# Patient Record
Sex: Female | Born: 1935 | Race: White | Hispanic: No | Smoking: Never smoker
Health system: Southern US, Community
[De-identification: ages and names within clinical notes are randomized; demographics above are authoritative.]

## PROBLEM LIST (undated history)

## (undated) DIAGNOSIS — R0602 Shortness of breath: Secondary | ICD-10-CM

## (undated) DIAGNOSIS — N183 Chronic kidney disease, stage 3 unspecified: Secondary | ICD-10-CM

## (undated) DIAGNOSIS — Z923 Personal history of irradiation: Secondary | ICD-10-CM

## (undated) DIAGNOSIS — H269 Unspecified cataract: Secondary | ICD-10-CM

## (undated) DIAGNOSIS — C50919 Malignant neoplasm of unspecified site of unspecified female breast: Secondary | ICD-10-CM

## (undated) DIAGNOSIS — M1990 Unspecified osteoarthritis, unspecified site: Secondary | ICD-10-CM

## (undated) DIAGNOSIS — T7840XA Allergy, unspecified, initial encounter: Secondary | ICD-10-CM

## (undated) DIAGNOSIS — E039 Hypothyroidism, unspecified: Secondary | ICD-10-CM

## (undated) DIAGNOSIS — Z5189 Encounter for other specified aftercare: Secondary | ICD-10-CM

## (undated) DIAGNOSIS — I491 Atrial premature depolarization: Secondary | ICD-10-CM

## (undated) DIAGNOSIS — Z9289 Personal history of other medical treatment: Secondary | ICD-10-CM

## (undated) DIAGNOSIS — F0390 Unspecified dementia without behavioral disturbance: Secondary | ICD-10-CM

## (undated) DIAGNOSIS — I10 Essential (primary) hypertension: Secondary | ICD-10-CM

## (undated) DIAGNOSIS — E785 Hyperlipidemia, unspecified: Secondary | ICD-10-CM

## (undated) DIAGNOSIS — R410 Disorientation, unspecified: Secondary | ICD-10-CM

## (undated) DIAGNOSIS — E079 Disorder of thyroid, unspecified: Secondary | ICD-10-CM

## (undated) DIAGNOSIS — I421 Obstructive hypertrophic cardiomyopathy: Secondary | ICD-10-CM

## (undated) DIAGNOSIS — C801 Malignant (primary) neoplasm, unspecified: Secondary | ICD-10-CM

## (undated) DIAGNOSIS — F419 Anxiety disorder, unspecified: Secondary | ICD-10-CM

## (undated) HISTORY — DX: Obstructive hypertrophic cardiomyopathy: I42.1

## (undated) HISTORY — DX: Malignant (primary) neoplasm, unspecified: C80.1

## (undated) HISTORY — PX: OTHER SURGICAL HISTORY: SHX169

## (undated) HISTORY — DX: Encounter for other specified aftercare: Z51.89

## (undated) HISTORY — DX: Allergy, unspecified, initial encounter: T78.40XA

## (undated) HISTORY — DX: Disorder of thyroid, unspecified: E07.9

## (undated) HISTORY — DX: Personal history of other medical treatment: Z92.89

## (undated) HISTORY — PX: ROTATOR CUFF REPAIR: SHX139

## (undated) HISTORY — DX: Essential (primary) hypertension: I10

## (undated) HISTORY — DX: Unspecified cataract: H26.9

## (undated) HISTORY — DX: Hyperlipidemia, unspecified: E78.5

## (undated) HISTORY — PX: JOINT REPLACEMENT: SHX530

## (undated) HISTORY — DX: Unspecified osteoarthritis, unspecified site: M19.90

## (undated) HISTORY — PX: KNEE SURGERY: SHX244

---

## 1998-06-08 ENCOUNTER — Ambulatory Visit (HOSPITAL_COMMUNITY): Admission: RE | Admit: 1998-06-08 | Discharge: 1998-06-08 | Payer: Self-pay | Admitting: Gastroenterology

## 1998-06-20 ENCOUNTER — Other Ambulatory Visit: Admission: RE | Admit: 1998-06-20 | Discharge: 1998-06-20 | Payer: Self-pay | Admitting: Cardiology

## 1998-11-21 ENCOUNTER — Other Ambulatory Visit: Admission: RE | Admit: 1998-11-21 | Discharge: 1998-11-21 | Payer: Self-pay | Admitting: Obstetrics and Gynecology

## 1998-11-24 HISTORY — PX: BREAST LUMPECTOMY: SHX2

## 1999-08-28 ENCOUNTER — Ambulatory Visit (HOSPITAL_COMMUNITY): Admission: RE | Admit: 1999-08-28 | Discharge: 1999-08-28 | Payer: Self-pay | Admitting: General Surgery

## 1999-08-28 ENCOUNTER — Encounter: Payer: Self-pay | Admitting: General Surgery

## 1999-09-03 ENCOUNTER — Other Ambulatory Visit: Admission: RE | Admit: 1999-09-03 | Discharge: 1999-09-03 | Payer: Self-pay | Admitting: Obstetrics and Gynecology

## 1999-09-06 ENCOUNTER — Encounter: Payer: Self-pay | Admitting: General Surgery

## 1999-09-10 ENCOUNTER — Encounter: Payer: Self-pay | Admitting: General Surgery

## 1999-09-10 ENCOUNTER — Ambulatory Visit (HOSPITAL_COMMUNITY): Admission: RE | Admit: 1999-09-10 | Discharge: 1999-09-10 | Payer: Self-pay | Admitting: General Surgery

## 1999-09-18 ENCOUNTER — Encounter: Admission: RE | Admit: 1999-09-18 | Discharge: 1999-12-17 | Payer: Self-pay | Admitting: Radiation Oncology

## 2000-01-11 ENCOUNTER — Other Ambulatory Visit: Admission: RE | Admit: 2000-01-11 | Discharge: 2000-01-11 | Payer: Self-pay | Admitting: Endocrinology

## 2000-04-30 ENCOUNTER — Other Ambulatory Visit: Admission: RE | Admit: 2000-04-30 | Discharge: 2000-04-30 | Payer: Self-pay | Admitting: Obstetrics and Gynecology

## 2000-07-21 ENCOUNTER — Ambulatory Visit (HOSPITAL_COMMUNITY): Admission: RE | Admit: 2000-07-21 | Discharge: 2000-07-21 | Payer: Self-pay | Admitting: Obstetrics and Gynecology

## 2000-07-21 ENCOUNTER — Encounter (INDEPENDENT_AMBULATORY_CARE_PROVIDER_SITE_OTHER): Payer: Self-pay | Admitting: Specialist

## 2000-08-03 ENCOUNTER — Other Ambulatory Visit: Admission: RE | Admit: 2000-08-03 | Discharge: 2000-08-03 | Payer: Self-pay | Admitting: Endocrinology

## 2000-11-24 HISTORY — PX: THYROID SURGERY: SHX805

## 2001-06-15 ENCOUNTER — Encounter: Payer: Self-pay | Admitting: Otolaryngology

## 2001-06-17 ENCOUNTER — Encounter (INDEPENDENT_AMBULATORY_CARE_PROVIDER_SITE_OTHER): Payer: Self-pay | Admitting: *Deleted

## 2001-06-17 ENCOUNTER — Inpatient Hospital Stay (HOSPITAL_COMMUNITY): Admission: RE | Admit: 2001-06-17 | Discharge: 2001-06-18 | Payer: Self-pay | Admitting: Otolaryngology

## 2001-08-04 ENCOUNTER — Other Ambulatory Visit: Admission: RE | Admit: 2001-08-04 | Discharge: 2001-08-04 | Payer: Self-pay | Admitting: Obstetrics and Gynecology

## 2002-12-29 ENCOUNTER — Other Ambulatory Visit: Admission: RE | Admit: 2002-12-29 | Discharge: 2002-12-29 | Payer: Self-pay | Admitting: Obstetrics and Gynecology

## 2004-02-09 ENCOUNTER — Ambulatory Visit (HOSPITAL_COMMUNITY): Admission: RE | Admit: 2004-02-09 | Discharge: 2004-02-09 | Payer: Self-pay | Admitting: Gastroenterology

## 2005-01-17 ENCOUNTER — Ambulatory Visit: Payer: Self-pay | Admitting: Hematology & Oncology

## 2005-05-13 ENCOUNTER — Emergency Department (HOSPITAL_COMMUNITY): Admission: EM | Admit: 2005-05-13 | Discharge: 2005-05-13 | Payer: Self-pay | Admitting: Emergency Medicine

## 2005-07-18 ENCOUNTER — Ambulatory Visit: Payer: Self-pay | Admitting: Hematology & Oncology

## 2005-11-24 HISTORY — PX: BREAST LUMPECTOMY: SHX2

## 2006-01-21 ENCOUNTER — Ambulatory Visit: Payer: Self-pay | Admitting: Hematology & Oncology

## 2006-06-19 ENCOUNTER — Encounter: Admission: RE | Admit: 2006-06-19 | Discharge: 2006-06-19 | Payer: Self-pay | Admitting: Gastroenterology

## 2006-07-20 ENCOUNTER — Ambulatory Visit: Payer: Self-pay | Admitting: Hematology & Oncology

## 2006-07-22 LAB — COMPREHENSIVE METABOLIC PANEL
CO2: 29 mEq/L (ref 19–32)
Glucose, Bld: 85 mg/dL (ref 70–99)
Sodium: 140 mEq/L (ref 135–145)
Total Bilirubin: 0.5 mg/dL (ref 0.3–1.2)
Total Protein: 6.9 g/dL (ref 6.0–8.3)

## 2006-07-22 LAB — CBC WITH DIFFERENTIAL/PLATELET
BASO%: 0.7 % (ref 0.0–2.0)
HCT: 39.3 % (ref 34.8–46.6)
MCHC: 34.1 g/dL (ref 32.0–36.0)
MONO#: 0.5 10*3/uL (ref 0.1–0.9)
NEUT%: 51.1 % (ref 39.6–76.8)
RBC: 4.24 10*6/uL (ref 3.70–5.32)
RDW: 13.3 % (ref 11.3–14.5)
WBC: 6 10*3/uL (ref 3.9–10.0)
lymph#: 2.3 10*3/uL (ref 0.9–3.3)

## 2006-09-15 ENCOUNTER — Ambulatory Visit (HOSPITAL_COMMUNITY): Admission: RE | Admit: 2006-09-15 | Discharge: 2006-09-15 | Payer: Self-pay | Admitting: Obstetrics and Gynecology

## 2006-09-22 ENCOUNTER — Encounter (INDEPENDENT_AMBULATORY_CARE_PROVIDER_SITE_OTHER): Payer: Self-pay | Admitting: Diagnostic Radiology

## 2006-09-22 ENCOUNTER — Encounter (INDEPENDENT_AMBULATORY_CARE_PROVIDER_SITE_OTHER): Payer: Self-pay | Admitting: *Deleted

## 2006-09-22 ENCOUNTER — Encounter: Admission: RE | Admit: 2006-09-22 | Discharge: 2006-09-22 | Payer: Self-pay | Admitting: General Surgery

## 2006-09-30 ENCOUNTER — Encounter: Admission: RE | Admit: 2006-09-30 | Discharge: 2006-09-30 | Payer: Self-pay | Admitting: General Surgery

## 2006-10-05 ENCOUNTER — Encounter: Admission: RE | Admit: 2006-10-05 | Discharge: 2006-10-05 | Payer: Self-pay | Admitting: General Surgery

## 2006-10-07 ENCOUNTER — Encounter (INDEPENDENT_AMBULATORY_CARE_PROVIDER_SITE_OTHER): Payer: Self-pay | Admitting: *Deleted

## 2006-10-07 ENCOUNTER — Emergency Department (HOSPITAL_COMMUNITY): Admission: EM | Admit: 2006-10-07 | Discharge: 2006-10-07 | Payer: Self-pay | Admitting: Emergency Medicine

## 2006-10-07 ENCOUNTER — Encounter: Admission: RE | Admit: 2006-10-07 | Discharge: 2006-10-07 | Payer: Self-pay | Admitting: General Surgery

## 2006-10-07 ENCOUNTER — Ambulatory Visit (HOSPITAL_BASED_OUTPATIENT_CLINIC_OR_DEPARTMENT_OTHER): Admission: RE | Admit: 2006-10-07 | Discharge: 2006-10-07 | Payer: Self-pay | Admitting: General Surgery

## 2006-11-24 HISTORY — PX: ABDOMINAL HYSTERECTOMY: SHX81

## 2006-12-07 ENCOUNTER — Ambulatory Visit: Admission: RE | Admit: 2006-12-07 | Discharge: 2007-02-13 | Payer: Self-pay | Admitting: Radiation Oncology

## 2006-12-16 ENCOUNTER — Encounter: Admission: RE | Admit: 2006-12-16 | Discharge: 2006-12-16 | Payer: Self-pay | Admitting: Gastroenterology

## 2007-01-18 ENCOUNTER — Ambulatory Visit: Payer: Self-pay | Admitting: Hematology & Oncology

## 2007-01-20 LAB — COMPREHENSIVE METABOLIC PANEL
Albumin: 4.6 g/dL (ref 3.5–5.2)
CO2: 28 mEq/L (ref 19–32)
Calcium: 9.2 mg/dL (ref 8.4–10.5)
Glucose, Bld: 75 mg/dL (ref 70–99)
Potassium: 4.1 mEq/L (ref 3.5–5.3)
Sodium: 133 mEq/L — ABNORMAL LOW (ref 135–145)
Total Protein: 6.9 g/dL (ref 6.0–8.3)

## 2007-01-20 LAB — CBC WITH DIFFERENTIAL/PLATELET
Eosinophils Absolute: 0.1 10*3/uL (ref 0.0–0.5)
LYMPH%: 24.1 % (ref 14.0–48.0)
MONO#: 0.5 10*3/uL (ref 0.1–0.9)
NEUT#: 3.3 10*3/uL (ref 1.5–6.5)
Platelets: 219 10*3/uL (ref 145–400)
RBC: 3.85 10*6/uL (ref 3.70–5.32)
RDW: 15.2 % — ABNORMAL HIGH (ref 11.3–14.5)
WBC: 5.2 10*3/uL (ref 3.9–10.0)

## 2007-02-17 ENCOUNTER — Ambulatory Visit (HOSPITAL_COMMUNITY): Admission: RE | Admit: 2007-02-17 | Discharge: 2007-02-17 | Payer: Self-pay | Admitting: Hematology & Oncology

## 2007-02-26 ENCOUNTER — Ambulatory Visit: Payer: Self-pay | Admitting: Hematology & Oncology

## 2007-02-26 ENCOUNTER — Encounter (HOSPITAL_COMMUNITY): Admission: RE | Admit: 2007-02-26 | Discharge: 2007-02-26 | Payer: Self-pay | Admitting: Hematology & Oncology

## 2007-02-26 ENCOUNTER — Observation Stay (HOSPITAL_COMMUNITY): Admission: AD | Admit: 2007-02-26 | Discharge: 2007-02-27 | Payer: Self-pay | Admitting: Hematology & Oncology

## 2007-02-26 LAB — CBC & DIFF AND RETIC
Eosinophils Absolute: 0.1 10*3/uL (ref 0.0–0.5)
MONO#: 0.6 10*3/uL (ref 0.1–0.9)
MONO%: 9.5 % (ref 0.0–13.0)
NEUT#: 3.9 10*3/uL (ref 1.5–6.5)
RBC: 2.17 10*6/uL — ABNORMAL LOW (ref 3.70–5.32)
RDW: 20.4 % — ABNORMAL HIGH (ref 11.3–14.5)
RETIC #: 210.7 10*3/uL — ABNORMAL HIGH (ref 19.7–115.1)
Retic %: 9.7 % — ABNORMAL HIGH (ref 0.4–2.3)
WBC: 6.1 10*3/uL (ref 3.9–10.0)

## 2007-02-26 LAB — CHCC SMEAR

## 2007-02-26 LAB — HOLD TUBE, BLOOD BANK

## 2007-03-01 LAB — CBC & DIFF AND RETIC
BASO%: 0.4 % (ref 0.0–2.0)
Basophils Absolute: 0 10*3/uL (ref 0.0–0.1)
EOS%: 2.2 % (ref 0.0–7.0)
Eosinophils Absolute: 0.1 10*3/uL (ref 0.0–0.5)
HCT: 34 % — ABNORMAL LOW (ref 34.8–46.6)
HGB: 12.1 g/dL (ref 11.6–15.9)
IRF: 0.45 — ABNORMAL HIGH (ref 0.130–0.330)
LYMPH%: 28 % (ref 14.0–48.0)
MCH: 34.2 pg — ABNORMAL HIGH (ref 26.0–34.0)
MCHC: 35.5 g/dL (ref 32.0–36.0)
MCV: 96.3 fL (ref 81.0–101.0)
MONO#: 0.6 10*3/uL (ref 0.1–0.9)
MONO%: 10.3 % (ref 0.0–13.0)
NEUT#: 3.4 10*3/uL (ref 1.5–6.5)
NEUT%: 59.1 % (ref 39.6–76.8)
Platelets: 263 10*3/uL (ref 145–400)
RBC: 3.53 10*6/uL — ABNORMAL LOW (ref 3.70–5.32)
RDW: 19.8 % — ABNORMAL HIGH (ref 11.3–14.5)
RETIC #: 231.9 10*3/uL — ABNORMAL HIGH (ref 19.7–115.1)
Retic %: 6.6 % — ABNORMAL HIGH (ref 0.4–2.3)
WBC: 5.7 10*3/uL (ref 3.9–10.0)
lymph#: 1.6 10*3/uL (ref 0.9–3.3)

## 2007-03-02 ENCOUNTER — Ambulatory Visit: Payer: Self-pay | Admitting: Hematology & Oncology

## 2007-03-02 LAB — PROTEIN ELECTROPHORESIS, SERUM
Alpha-1-Globulin: 4.8 % (ref 2.9–4.9)
Gamma Globulin: 10.8 % — ABNORMAL LOW (ref 11.1–18.8)
Total Protein, Serum Electrophoresis: 7.1 g/dL (ref 6.0–8.3)

## 2007-03-02 LAB — ERYTHROPOIETIN: Erythropoietin: 43.9 m[IU]/mL — ABNORMAL HIGH (ref 2.6–34.0)

## 2007-03-02 LAB — FERRITIN: Ferritin: 181 ng/mL (ref 10–291)

## 2007-03-03 LAB — CBC & DIFF AND RETIC
Basophils Absolute: 0 10*3/uL (ref 0.0–0.1)
Eosinophils Absolute: 0.1 10*3/uL (ref 0.0–0.5)
HGB: 11.6 g/dL (ref 11.6–15.9)
LYMPH%: 26.2 % (ref 14.0–48.0)
MCV: 96.1 fL (ref 81.0–101.0)
MONO#: 0.6 10*3/uL (ref 0.1–0.9)
MONO%: 11 % (ref 0.0–13.0)
NEUT#: 3.2 10*3/uL (ref 1.5–6.5)
Platelets: 247 10*3/uL (ref 145–400)
RBC: 3.34 10*6/uL — ABNORMAL LOW (ref 3.70–5.32)
RETIC #: 125.6 10*3/uL — ABNORMAL HIGH (ref 19.7–115.1)
Retic %: 3.8 % — ABNORMAL HIGH (ref 0.4–2.3)
WBC: 5.4 10*3/uL (ref 3.9–10.0)

## 2007-03-03 LAB — HOLD TUBE, BLOOD BANK

## 2007-03-03 LAB — CHCC SMEAR

## 2007-03-05 LAB — CBC & DIFF AND RETIC
Eosinophils Absolute: 0.1 10*3/uL (ref 0.0–0.5)
LYMPH%: 34.5 % (ref 14.0–48.0)
MCV: 96.5 fL (ref 81.0–101.0)
MONO%: 14.5 % — ABNORMAL HIGH (ref 0.0–13.0)
NEUT#: 2.1 10*3/uL (ref 1.5–6.5)
Platelets: 212 10*3/uL (ref 145–400)
RBC: 3.12 10*6/uL — ABNORMAL LOW (ref 3.70–5.32)
Retic %: 2.9 % — ABNORMAL HIGH (ref 0.4–2.3)

## 2007-03-08 LAB — CBC & DIFF AND RETIC
Basophils Absolute: 0.1 10*3/uL (ref 0.0–0.1)
Eosinophils Absolute: 0.1 10*3/uL (ref 0.0–0.5)
HCT: 31.6 % — ABNORMAL LOW (ref 34.8–46.6)
HGB: 11.1 g/dL — ABNORMAL LOW (ref 11.6–15.9)
LYMPH%: 19.7 % (ref 14.0–48.0)
MCHC: 35.1 g/dL (ref 32.0–36.0)
MONO#: 0.5 10*3/uL (ref 0.1–0.9)
NEUT#: 3 10*3/uL (ref 1.5–6.5)
NEUT%: 63.7 % (ref 39.6–76.8)
Platelets: 196 10*3/uL (ref 145–400)
WBC: 4.7 10*3/uL (ref 3.9–10.0)

## 2007-03-08 LAB — HOLD TUBE, BLOOD BANK

## 2007-03-10 LAB — CBC & DIFF AND RETIC
Basophils Absolute: 0.1 10*3/uL (ref 0.0–0.1)
HCT: 30.9 % — ABNORMAL LOW (ref 34.8–46.6)
HGB: 10.8 g/dL — ABNORMAL LOW (ref 11.6–15.9)
IRF: 0.24 (ref 0.130–0.330)
MONO#: 0.6 10*3/uL (ref 0.1–0.9)
NEUT#: 1.7 10*3/uL (ref 1.5–6.5)
NEUT%: 46 % (ref 39.6–76.8)
RDW: 16.6 % — ABNORMAL HIGH (ref 11.3–14.5)
RETIC #: 49.3 10*3/uL (ref 19.7–115.1)
WBC: 3.7 10*3/uL — ABNORMAL LOW (ref 3.9–10.0)
lymph#: 1.2 10*3/uL (ref 0.9–3.3)

## 2007-03-10 LAB — HOLD TUBE, BLOOD BANK

## 2007-03-12 LAB — CBC & DIFF AND RETIC
Basophils Absolute: 0 10*3/uL (ref 0.0–0.1)
Eosinophils Absolute: 0.1 10*3/uL (ref 0.0–0.5)
HCT: 32.5 % — ABNORMAL LOW (ref 34.8–46.6)
HGB: 11.4 g/dL — ABNORMAL LOW (ref 11.6–15.9)
LYMPH%: 34.4 % (ref 14.0–48.0)
MCV: 97.3 fL (ref 81.0–101.0)
MONO#: 0.5 10*3/uL (ref 0.1–0.9)
MONO%: 12.1 % (ref 0.0–13.0)
NEUT#: 1.8 10*3/uL (ref 1.5–6.5)
NEUT%: 48.3 % (ref 39.6–76.8)
Platelets: 197 10*3/uL (ref 145–400)
WBC: 3.7 10*3/uL — ABNORMAL LOW (ref 3.9–10.0)

## 2007-03-12 LAB — HOLD TUBE, BLOOD BANK

## 2007-03-17 LAB — CBC & DIFF AND RETIC
BASO%: 0.6 % (ref 0.0–2.0)
EOS%: 2.6 % (ref 0.0–7.0)
LYMPH%: 30 % (ref 14.0–48.0)
MCHC: 35.4 g/dL (ref 32.0–36.0)
MCV: 98.1 fL (ref 81.0–101.0)
MONO%: 11.2 % (ref 0.0–13.0)
Platelets: 220 10*3/uL (ref 145–400)
RBC: 3.44 10*6/uL — ABNORMAL LOW (ref 3.70–5.32)
Retic %: 2.6 % — ABNORMAL HIGH (ref 0.4–2.3)

## 2007-03-24 LAB — CBC & DIFF AND RETIC
BASO%: 0.9 % (ref 0.0–2.0)
Eosinophils Absolute: 0.1 10*3/uL (ref 0.0–0.5)
LYMPH%: 30 % (ref 14.0–48.0)
MCHC: 35.4 g/dL (ref 32.0–36.0)
MCV: 97.4 fL (ref 81.0–101.0)
MONO%: 9.7 % (ref 0.0–13.0)
Platelets: 203 10*3/uL (ref 145–400)
RBC: 3.39 10*6/uL — ABNORMAL LOW (ref 3.70–5.32)
Retic %: 1.6 % (ref 0.4–2.3)

## 2007-03-31 LAB — CBC & DIFF AND RETIC
Basophils Absolute: 0 10*3/uL (ref 0.0–0.1)
EOS%: 2.8 % (ref 0.0–7.0)
Eosinophils Absolute: 0.1 10*3/uL (ref 0.0–0.5)
HCT: 34.3 % — ABNORMAL LOW (ref 34.8–46.6)
HGB: 12 g/dL (ref 11.6–15.9)
IRF: 0.2 (ref 0.130–0.330)
MCH: 33.9 pg (ref 26.0–34.0)
NEUT%: 53.8 % (ref 39.6–76.8)
lymph#: 1.6 10*3/uL (ref 0.9–3.3)

## 2007-03-31 LAB — CHCC SMEAR

## 2007-04-09 LAB — CBC & DIFF AND RETIC
BASO%: 0.8 % (ref 0.0–2.0)
Eosinophils Absolute: 0.1 10*3/uL (ref 0.0–0.5)
LYMPH%: 29.9 % (ref 14.0–48.0)
MCHC: 35.7 g/dL (ref 32.0–36.0)
MONO#: 0.6 10*3/uL (ref 0.1–0.9)
NEUT#: 2.6 10*3/uL (ref 1.5–6.5)
Platelets: 184 10*3/uL (ref 145–400)
RBC: 3.72 10*6/uL (ref 3.70–5.32)
RDW: 13.9 % (ref 11.3–14.5)
RETIC #: 41.3 10*3/uL (ref 19.7–115.1)
Retic %: 1.1 % (ref 0.4–2.3)
WBC: 4.8 10*3/uL (ref 3.9–10.0)
lymph#: 1.4 10*3/uL (ref 0.9–3.3)

## 2007-04-09 LAB — CHCC SMEAR

## 2007-04-12 LAB — GLUCOSE 6 PHOSPHATE DEHYDROGENASE: G-6-PD, Quant: 12.8

## 2007-04-26 ENCOUNTER — Ambulatory Visit: Payer: Self-pay | Admitting: Hematology & Oncology

## 2007-04-28 LAB — CBC & DIFF AND RETIC
BASO%: 0.7 % (ref 0.0–2.0)
EOS%: 1.5 % (ref 0.0–7.0)
HCT: 36.6 % (ref 34.8–46.6)
LYMPH%: 34.5 % (ref 14.0–48.0)
MCH: 33.6 pg (ref 26.0–34.0)
MCHC: 35.7 g/dL (ref 32.0–36.0)
NEUT%: 49.1 % (ref 39.6–76.8)
Platelets: 188 10*3/uL (ref 145–400)
RBC: 3.89 10*6/uL (ref 3.70–5.32)
Retic %: 1.3 % (ref 0.4–2.3)
lymph#: 1.7 10*3/uL (ref 0.9–3.3)

## 2007-05-10 ENCOUNTER — Encounter (INDEPENDENT_AMBULATORY_CARE_PROVIDER_SITE_OTHER): Payer: Self-pay | Admitting: Obstetrics and Gynecology

## 2007-05-10 ENCOUNTER — Inpatient Hospital Stay (HOSPITAL_COMMUNITY): Admission: RE | Admit: 2007-05-10 | Discharge: 2007-05-12 | Payer: Self-pay | Admitting: Obstetrics and Gynecology

## 2007-05-18 ENCOUNTER — Inpatient Hospital Stay (HOSPITAL_COMMUNITY): Admission: AD | Admit: 2007-05-18 | Discharge: 2007-05-18 | Payer: Self-pay | Admitting: Obstetrics and Gynecology

## 2007-05-26 ENCOUNTER — Ambulatory Visit: Payer: Self-pay | Admitting: Hematology & Oncology

## 2007-05-26 LAB — CBC & DIFF AND RETIC
BASO%: 0.3 % (ref 0.0–2.0)
EOS%: 2.2 % (ref 0.0–7.0)
HCT: 36.3 % (ref 34.8–46.6)
LYMPH%: 19.8 % (ref 14.0–48.0)
MCH: 33.2 pg (ref 26.0–34.0)
MCHC: 35.7 g/dL (ref 32.0–36.0)
MONO%: 6.8 % (ref 0.0–13.0)
NEUT%: 70.9 % (ref 39.6–76.8)
Platelets: 291 10*3/uL (ref 145–400)

## 2007-05-26 LAB — CHCC SMEAR

## 2007-06-09 LAB — CBC & DIFF AND RETIC
BASO%: 0.6 % (ref 0.0–2.0)
EOS%: 2.7 % (ref 0.0–7.0)
HCT: 34.2 % — ABNORMAL LOW (ref 34.8–46.6)
IRF: 0.33 (ref 0.130–0.330)
MCH: 32.9 pg (ref 26.0–34.0)
MCHC: 35.6 g/dL (ref 32.0–36.0)
MCV: 92.6 fL (ref 81.0–101.0)
MONO%: 10.5 % (ref 0.0–13.0)
NEUT%: 55.5 % (ref 39.6–76.8)
RDW: 13 % (ref 11.3–14.5)
lymph#: 1.6 10*3/uL (ref 0.9–3.3)

## 2007-06-30 LAB — CBC & DIFF AND RETIC
Basophils Absolute: 0 10*3/uL (ref 0.0–0.1)
EOS%: 2.8 % (ref 0.0–7.0)
Eosinophils Absolute: 0.1 10*3/uL (ref 0.0–0.5)
HCT: 36.4 % (ref 34.8–46.6)
HGB: 12.9 g/dL (ref 11.6–15.9)
LYMPH%: 26.3 % (ref 14.0–48.0)
MCH: 33 pg (ref 26.0–34.0)
MCV: 92.9 fL (ref 81.0–101.0)
MONO%: 8.8 % (ref 0.0–13.0)
NEUT%: 61.8 % (ref 39.6–76.8)
Platelets: 216 10*3/uL (ref 145–400)
RDW: 13.2 % (ref 11.3–14.5)

## 2007-07-23 ENCOUNTER — Ambulatory Visit: Payer: Self-pay | Admitting: Hematology & Oncology

## 2007-07-29 LAB — CBC & DIFF AND RETIC
Basophils Absolute: 0 10*3/uL (ref 0.0–0.1)
Eosinophils Absolute: 0.1 10*3/uL (ref 0.0–0.5)
HCT: 37.1 % (ref 34.8–46.6)
HGB: 13.1 g/dL (ref 11.6–15.9)
LYMPH%: 33.6 % (ref 14.0–48.0)
MCV: 92.7 fL (ref 81.0–101.0)
MONO#: 0.6 10*3/uL (ref 0.1–0.9)
MONO%: 9.5 % (ref 0.0–13.0)
NEUT#: 3.2 10*3/uL (ref 1.5–6.5)
Platelets: 170 10*3/uL (ref 145–400)
Retic %: 1.4 % (ref 0.4–2.3)
WBC: 5.9 10*3/uL (ref 3.9–10.0)

## 2007-09-27 ENCOUNTER — Encounter: Admission: RE | Admit: 2007-09-27 | Discharge: 2007-09-27 | Payer: Self-pay | Admitting: Hematology & Oncology

## 2007-11-30 ENCOUNTER — Ambulatory Visit: Payer: Self-pay | Admitting: Hematology & Oncology

## 2007-12-02 LAB — CBC WITH DIFFERENTIAL/PLATELET
Basophils Absolute: 0.1 10*3/uL (ref 0.0–0.1)
Eosinophils Absolute: 0.1 10*3/uL (ref 0.0–0.5)
HCT: 37.6 % (ref 34.8–46.6)
HGB: 13.1 g/dL (ref 11.6–15.9)
LYMPH%: 26 % (ref 14.0–48.0)
MCV: 91.5 fL (ref 81.0–101.0)
MONO%: 9 % (ref 0.0–13.0)
NEUT#: 3.1 10*3/uL (ref 1.5–6.5)
Platelets: 232 10*3/uL (ref 145–400)

## 2007-12-02 LAB — COMPREHENSIVE METABOLIC PANEL
Albumin: 4.4 g/dL (ref 3.5–5.2)
Alkaline Phosphatase: 37 U/L — ABNORMAL LOW (ref 39–117)
BUN: 11 mg/dL (ref 6–23)
Glucose, Bld: 99 mg/dL (ref 70–99)
Total Bilirubin: 0.4 mg/dL (ref 0.3–1.2)

## 2008-02-11 ENCOUNTER — Ambulatory Visit (HOSPITAL_COMMUNITY): Admission: RE | Admit: 2008-02-11 | Discharge: 2008-02-11 | Payer: Self-pay | Admitting: *Deleted

## 2008-04-14 ENCOUNTER — Encounter: Admission: RE | Admit: 2008-04-14 | Discharge: 2008-04-14 | Payer: Self-pay | Admitting: Orthopaedic Surgery

## 2008-06-07 ENCOUNTER — Ambulatory Visit: Payer: Self-pay | Admitting: Hematology & Oncology

## 2008-06-09 LAB — COMPREHENSIVE METABOLIC PANEL
ALT: 21 U/L (ref 0–35)
Albumin: 4.5 g/dL (ref 3.5–5.2)
CO2: 26 mEq/L (ref 19–32)
Calcium: 9.6 mg/dL (ref 8.4–10.5)
Chloride: 101 mEq/L (ref 96–112)
Creatinine, Ser: 0.92 mg/dL (ref 0.40–1.20)

## 2008-06-09 LAB — CBC WITH DIFFERENTIAL (CANCER CENTER ONLY)
BASO%: 0.7 % (ref 0.0–2.0)
HCT: 37 % (ref 34.8–46.6)
LYMPH%: 34.9 % (ref 14.0–48.0)
MCH: 33.2 pg (ref 26.0–34.0)
MCHC: 34.7 g/dL (ref 32.0–36.0)
MCV: 96 fL (ref 81–101)
MONO%: 8.2 % (ref 0.0–13.0)
NEUT%: 53.3 % (ref 39.6–80.0)
Platelets: 208 10*3/uL (ref 145–400)
RDW: 11.5 % (ref 10.5–14.6)

## 2008-09-27 ENCOUNTER — Encounter: Admission: RE | Admit: 2008-09-27 | Discharge: 2008-09-27 | Payer: Self-pay | Admitting: General Surgery

## 2008-12-12 ENCOUNTER — Ambulatory Visit: Payer: Self-pay | Admitting: Hematology & Oncology

## 2009-01-25 LAB — COMPREHENSIVE METABOLIC PANEL
AST: 29 U/L (ref 0–37)
Albumin: 4.5 g/dL (ref 3.5–5.2)
Alkaline Phosphatase: 32 U/L — ABNORMAL LOW (ref 39–117)
BUN: 13 mg/dL (ref 6–23)
Potassium: 4.4 mEq/L (ref 3.5–5.3)
Sodium: 141 mEq/L (ref 135–145)
Total Protein: 6.7 g/dL (ref 6.0–8.3)

## 2009-01-25 LAB — CBC WITH DIFFERENTIAL (CANCER CENTER ONLY)
BASO#: 0 10*3/uL (ref 0.0–0.2)
Eosinophils Absolute: 0.2 10*3/uL (ref 0.0–0.5)
HCT: 39.9 % (ref 34.8–46.6)
HGB: 13.2 g/dL (ref 11.6–15.9)
LYMPH%: 37.3 % (ref 14.0–48.0)
MCH: 31.2 pg (ref 26.0–34.0)
MCV: 94 fL (ref 81–101)
MONO#: 0.3 10*3/uL (ref 0.1–0.9)
MONO%: 8 % (ref 0.0–13.0)
NEUT%: 50.9 % (ref 39.6–80.0)
Platelets: 191 10*3/uL (ref 145–400)
RBC: 4.23 10*6/uL (ref 3.70–5.32)

## 2009-01-25 LAB — RETICULOCYTES (CHCC): ABS Retic: 54.3 10*3/uL (ref 19.0–186.0)

## 2009-08-01 ENCOUNTER — Ambulatory Visit: Payer: Self-pay | Admitting: Hematology & Oncology

## 2009-08-02 LAB — CBC WITH DIFFERENTIAL (CANCER CENTER ONLY)
BASO#: 0 10*3/uL (ref 0.0–0.2)
Eosinophils Absolute: 0.2 10*3/uL (ref 0.0–0.5)
HGB: 13.6 g/dL (ref 11.6–15.9)
MCH: 31.6 pg (ref 26.0–34.0)
MONO#: 0.3 10*3/uL (ref 0.1–0.9)
MONO%: 6.3 % (ref 0.0–13.0)
NEUT#: 2.8 10*3/uL (ref 1.5–6.5)
RBC: 4.3 10*6/uL (ref 3.70–5.32)
WBC: 5.1 10*3/uL (ref 3.9–10.0)

## 2009-08-03 LAB — RETICULOCYTES (CHCC)
RBC.: 4.33 MIL/uL (ref 3.87–5.11)
Retic Ct Pct: 1 % (ref 0.4–3.1)

## 2009-08-03 LAB — COMPREHENSIVE METABOLIC PANEL WITH GFR
ALT: 34 U/L (ref 0–35)
AST: 45 U/L — ABNORMAL HIGH (ref 0–37)
Albumin: 4.7 g/dL (ref 3.5–5.2)
Alkaline Phosphatase: 30 U/L — ABNORMAL LOW (ref 39–117)
BUN: 14 mg/dL (ref 6–23)
CO2: 27 meq/L (ref 19–32)
Calcium: 10 mg/dL (ref 8.4–10.5)
Chloride: 94 meq/L — ABNORMAL LOW (ref 96–112)
Creatinine, Ser: 0.93 mg/dL (ref 0.40–1.20)
Glucose, Bld: 90 mg/dL (ref 70–99)
Potassium: 3.8 meq/L (ref 3.5–5.3)
Sodium: 132 meq/L — ABNORMAL LOW (ref 135–145)
Total Bilirubin: 0.6 mg/dL (ref 0.3–1.2)
Total Protein: 7.3 g/dL (ref 6.0–8.3)

## 2009-08-03 LAB — VITAMIN D 25 HYDROXY (VIT D DEFICIENCY, FRACTURES): Vit D, 25-Hydroxy: 76 ng/mL (ref 30–89)

## 2009-09-07 ENCOUNTER — Ambulatory Visit: Payer: Self-pay | Admitting: Hematology & Oncology

## 2009-09-28 ENCOUNTER — Encounter: Admission: RE | Admit: 2009-09-28 | Discharge: 2009-09-28 | Payer: Self-pay | Admitting: General Surgery

## 2009-11-24 HISTORY — PX: CATARACT EXTRACTION: SUR2

## 2010-01-16 ENCOUNTER — Encounter: Admission: RE | Admit: 2010-01-16 | Discharge: 2010-01-16 | Payer: Self-pay | Admitting: Orthopaedic Surgery

## 2010-01-23 ENCOUNTER — Ambulatory Visit: Payer: Self-pay | Admitting: Hematology & Oncology

## 2010-01-24 LAB — CBC WITH DIFFERENTIAL (CANCER CENTER ONLY)
BASO#: 0 10*3/uL (ref 0.0–0.2)
Eosinophils Absolute: 0.2 10*3/uL (ref 0.0–0.5)
HCT: 39.2 % (ref 34.8–46.6)
HGB: 12.9 g/dL (ref 11.6–15.9)
LYMPH%: 39.6 % (ref 14.0–48.0)
MCH: 30.8 pg (ref 26.0–34.0)
MCV: 93 fL (ref 81–101)
MONO#: 0.4 10*3/uL (ref 0.1–0.9)
MONO%: 7.1 % (ref 0.0–13.0)
NEUT%: 49.3 % (ref 39.6–80.0)
Platelets: 211 10*3/uL (ref 145–400)
RBC: 4.2 10*6/uL (ref 3.70–5.32)

## 2010-01-24 LAB — COMPREHENSIVE METABOLIC PANEL
Alkaline Phosphatase: 27 U/L — ABNORMAL LOW (ref 39–117)
BUN: 17 mg/dL (ref 6–23)
CO2: 22 mEq/L (ref 19–32)
Creatinine, Ser: 0.82 mg/dL (ref 0.40–1.20)
Glucose, Bld: 84 mg/dL (ref 70–99)
Total Bilirubin: 0.4 mg/dL (ref 0.3–1.2)

## 2010-01-24 LAB — VITAMIN D 25 HYDROXY (VIT D DEFICIENCY, FRACTURES): Vit D, 25-Hydroxy: 67 ng/mL (ref 30–89)

## 2010-03-11 ENCOUNTER — Encounter: Admission: RE | Admit: 2010-03-11 | Discharge: 2010-03-11 | Payer: Self-pay | Admitting: Endocrinology

## 2010-07-30 ENCOUNTER — Ambulatory Visit: Payer: Self-pay | Admitting: Hematology & Oncology

## 2010-08-01 LAB — CBC WITH DIFFERENTIAL (CANCER CENTER ONLY)
BASO#: 0 10*3/uL (ref 0.0–0.2)
EOS%: 3.4 % (ref 0.0–7.0)
HGB: 13.3 g/dL (ref 11.6–15.9)
LYMPH%: 31.1 % (ref 14.0–48.0)
MCH: 32.1 pg (ref 26.0–34.0)
MCHC: 33.9 g/dL (ref 32.0–36.0)
MCV: 95 fL (ref 81–101)
MONO%: 7.7 % (ref 0.0–13.0)
NEUT#: 2.9 10*3/uL (ref 1.5–6.5)
Platelets: 201 10*3/uL (ref 145–400)

## 2010-08-01 LAB — COMPREHENSIVE METABOLIC PANEL
AST: 34 U/L (ref 0–37)
Albumin: 4.4 g/dL (ref 3.5–5.2)
Alkaline Phosphatase: 32 U/L — ABNORMAL LOW (ref 39–117)
BUN: 12 mg/dL (ref 6–23)
Potassium: 4.1 mEq/L (ref 3.5–5.3)
Total Bilirubin: 0.4 mg/dL (ref 0.3–1.2)

## 2010-08-17 ENCOUNTER — Encounter: Admission: RE | Admit: 2010-08-17 | Discharge: 2010-08-17 | Payer: Self-pay | Admitting: Endocrinology

## 2010-09-30 ENCOUNTER — Encounter: Admission: RE | Admit: 2010-09-30 | Discharge: 2010-09-30 | Payer: Self-pay | Admitting: Obstetrics and Gynecology

## 2010-12-11 ENCOUNTER — Ambulatory Visit: Payer: Self-pay | Admitting: Hematology & Oncology

## 2010-12-12 LAB — CBC WITH DIFFERENTIAL (CANCER CENTER ONLY)
BASO#: 0 10*3/uL (ref 0.0–0.2)
BASO%: 0.5 % (ref 0.0–2.0)
EOS%: 3.1 % (ref 0.0–7.0)
Eosinophils Absolute: 0.2 10*3/uL (ref 0.0–0.5)
HCT: 41 % (ref 34.8–46.6)
HGB: 13.9 g/dL (ref 11.6–15.9)
LYMPH#: 1.7 10*3/uL (ref 0.9–3.3)
LYMPH%: 28.9 % (ref 14.0–48.0)
MCH: 31.9 pg (ref 26.0–34.0)
MCHC: 34 g/dL (ref 32.0–36.0)
MCV: 94 fL (ref 81–101)
MONO#: 0.4 10*3/uL (ref 0.1–0.9)
MONO%: 7 % (ref 0.0–13.0)
NEUT#: 3.6 10*3/uL (ref 1.5–6.5)
NEUT%: 60.5 % (ref 39.6–80.0)
Platelets: 228 10*3/uL (ref 145–400)
RBC: 4.36 10*6/uL (ref 3.70–5.32)
RDW: 11.7 % (ref 10.5–14.6)
WBC: 5.9 10*3/uL (ref 3.9–10.0)

## 2010-12-12 LAB — CHCC SATELLITE - SMEAR

## 2010-12-13 LAB — COMPREHENSIVE METABOLIC PANEL
ALT: 28 U/L (ref 0–35)
AST: 31 U/L (ref 0–37)
Albumin: 4.7 g/dL (ref 3.5–5.2)
Alkaline Phosphatase: 37 U/L — ABNORMAL LOW (ref 39–117)
BUN: 13 mg/dL (ref 6–23)
CO2: 31 mEq/L (ref 19–32)
Calcium: 10.1 mg/dL (ref 8.4–10.5)
Chloride: 97 mEq/L (ref 96–112)
Creatinine, Ser: 0.87 mg/dL (ref 0.40–1.20)
Glucose, Bld: 78 mg/dL (ref 70–99)
Potassium: 3.7 mEq/L (ref 3.5–5.3)
Sodium: 137 mEq/L (ref 135–145)
Total Bilirubin: 0.4 mg/dL (ref 0.3–1.2)
Total Protein: 6.9 g/dL (ref 6.0–8.3)

## 2010-12-13 LAB — VITAMIN D 25 HYDROXY (VIT D DEFICIENCY, FRACTURES): Vit D, 25-Hydroxy: 69 ng/mL (ref 30–89)

## 2011-01-15 ENCOUNTER — Other Ambulatory Visit: Payer: Self-pay | Admitting: Gastroenterology

## 2011-01-17 ENCOUNTER — Ambulatory Visit
Admission: RE | Admit: 2011-01-17 | Discharge: 2011-01-17 | Disposition: A | Discharge status: Home / Self Care | Payer: Medicare Other | Source: Ambulatory Visit | Attending: Gastroenterology | Admitting: Gastroenterology

## 2011-01-22 ENCOUNTER — Other Ambulatory Visit: Payer: Self-pay | Admitting: Orthopaedic Surgery

## 2011-01-22 DIAGNOSIS — M25511 Pain in right shoulder: Secondary | ICD-10-CM

## 2011-01-29 ENCOUNTER — Other Ambulatory Visit: Payer: BC Managed Care – PPO

## 2011-02-05 ENCOUNTER — Ambulatory Visit
Admission: RE | Admit: 2011-02-05 | Discharge: 2011-02-05 | Disposition: A | Discharge status: Home / Self Care | Payer: BC Managed Care – PPO | Source: Ambulatory Visit | Attending: Orthopaedic Surgery | Admitting: Orthopaedic Surgery

## 2011-02-05 DIAGNOSIS — M25511 Pain in right shoulder: Secondary | ICD-10-CM

## 2011-03-26 ENCOUNTER — Encounter (INDEPENDENT_AMBULATORY_CARE_PROVIDER_SITE_OTHER): Payer: Self-pay | Admitting: General Surgery

## 2011-04-08 NOTE — Discharge Summary (Signed)
NAMEISEL, SKUFCA NO.:  [id number]   MEDICAL RECORD NO.:  [id number]          PATIENT TYPE:  INP   LOCATION:  9319                          FACILITY:  [facility]   PHYSICIAN:  Randye Lobo, M.D.   DATE OF BIRTH:  [birth year]   DATE OF ADMISSION:  05/10/2007  DATE OF DISCHARGE:  05/12/2007                               DISCHARGE SUMMARY   ADMISSION DIAGNOSES:  1. Incomplete uterovaginal prolapse.  2. Prolapsing cervical fibroid versus endometrial polyp.  3. Uterine fibroids.   DISCHARGE DIAGNOSES:  1. Incomplete uterovaginal prolapse.  2. Prolapsing cervical fibroid versus endometrial polyp.  3. Uterine fibroids.  4. Postoperative urinary retention.   OPERATIONS AND PROCEDURES:  The patient underwent a laparoscopically-  assisted vaginal hysterectomy with bilateral salpingo-oophorectomy,  McCall culdoplasty, anterior and posterior colporrhaphy and cystoscopy  on May 10, 2007 under the direction of Dr. Conley Simmonds with assistance  of Dr. Lodema Hong.   ADMISSION HISTORY AND PHYSICAL EXAMINATION:  The patient is a 77-year-  old para 2 Caucasian female who presented with pelvic pressure and a  known cystocele and uterine prolapse.  The patient previously had had a  CT scan documenting uterine fibroids.  The patient had no history of  urinary stress incontinence and with reduction of her prolapse and  urodynamic testing in the office, she again demonstrated no genuine  stress incontinence.  In the office, the patient was known to have a  prolapsing cervical fibroid versus polyp which was too large for removal  in an office setting.   PAST MEDICAL HISTORY:  Significant for bilateral breast cancer for which  she has undergone lumpectomies.  The patient is also status post partial  left thyroidectomy and is currently treated for hypothyroidism.  The  patient also has a history of urethral polyps for which she sees her  urologist regularly.   PHYSICAL  EXAMINATION:  VITAL SIGNS:  The patient's blood pressure was  121/70.  She was 5 feet 2-3/4 inches tall and weight 140-1/2 pounds.  PELVIC:  She was noted to have normal external genitalia.  She had a  second-degree cystocele, first to second-degree uterine prolapse.  There  was evidence of a polyp or a myoma prolapsing from the cervical os.  The  uterus was noted to be small and nontender and no adnexal masses were  appreciated.   HOSPITAL COURSE:  The patient was admitted on May 10, 2007 at which  time she underwent a laparoscopically-assisted vaginal hysterectomy with  bilateral salpingo-oophorectomy, McCall's culdoplasty, anterior and  posterior colporrhaphy and cystoscopy under the direction of Dr. Conley Simmonds and the assistance of Dr. Lodema Hong.  The patient's surgery was  uncomplicated.  There was evidence of a 1.5 cm prolapsing cervical  fibroids versus an endometrial polyp.  Cystoscopy demonstrated the  absence of urethral and bladder polyps.   Postoperatively, the patient had a stable course.  The patient's pain  was controlled postoperatively with a morphine PCA.  She was  successfully converted over to Percocet and ibuprofen for control of her  pain prior to discharge.  The patient received both TED hose and PAS  stockings for DVT prophylaxis.  She was able to ambulate independently  during her hospitalization.  The patient had minimal vaginal bleeding  following the surgery and her discharge hemoglobin was 11.6.  The  patient's diet was slowly advanced to normal during her hospitalization.   The patient began with her voiding trials on postoperative day #1 and  had urinary retention and her Foley catheter was therefore replaced.  Her Foley catheter was again removed on postoperative day #2 and she had  again some ability to void spontaneously but did have residual volumes  in the 350 range.  Her Foley catheter was therefore replaced and left to  gravity drainage at the  time of her discharge.   The patient's final pathology report is pending at the time of her  discharge.   The patient is found to be in good condition and ready for discharge on  postoperative day #2.   DISCHARGE INSTRUCTIONS:  1. Discharged to home.  2. The patient will take the following medications:      a.     Percocet 5/325 mg one to two p.o. q.4-6 hours p.r.n. pain.      b.     Ibuprofen 200 mg 3 tablets p.o. q.6 hours p.r.n. pain.      c.     Colace 100 mg p.o. b.i.d. for stool softening.      d.     Ciprofloxacin 250 mg one p.o. b.i.d. x5 days.  3. The patient will follow a regular diet.  4. The patient will have decreased activity.  She will not drive for 2-      3 weeks.  She will not lift anything greater than 10 pounds for 12      weeks.  The  the patient will leave her Foley catheter to drainage.  She has been  given instructions on catheter care.   The patient will follow up in the office in 5 days for a voiding trial.  The patient will call if she experiences any problems with fever, nausea  and vomiting, pain uncontrolled by her medication, active vaginal  bleeding, blood in the urine, or any other concern.      Randye Lobo, M.D.  Electronically Signed     BES/MEDQ  D:  06/09/2007  T:  06/09/2007  Job:  [id number]

## 2011-04-08 NOTE — Op Note (Signed)
Hannah Galvan, Hannah Galvan NO.:  [id number]   MEDICAL RECORD NO.:  [id number]          PATIENT TYPE:  INP   LOCATION:  9319                          FACILITY:  [facility]   PHYSICIAN:  Randye Lobo, M.D.   DATE OF BIRTH:  January 02, 1931   DATE OF PROCEDURE:  05/10/2007  DATE OF DISCHARGE:                               OPERATIVE REPORT   PREOPERATIVE DIAGNOSIS:  1. Incomplete uterovaginal prolapse.  2. Prolapsing cervical fibroid versus endometrial polyp.  3. Uterine fibroids.   POSTOPERATIVE DIAGNOSIS:  1. Incomplete uterovaginal prolapse.  2. Prolapsing cervical fibroid versus endometrial polyp.  3. Uterine fibroids.   PROCEDURE:  Laparoscopically assisted vaginal hysterectomy, bilateral  salpingo-oophorectomy, McCall culdoplasty, anterior and posterior  colporrhaphy, cystoscopy.   SURGEON:  Conley Simmonds, M.D.   ASSISTANT:  Lodema Hong, M.D.   ANESTHESIA:  General endotracheal, local with 0.5% lidocaine with  epinephrine 1:200,000.   IV FLUIDS:  2500 mL Ringer's lactate.   ESTIMATED BLOOD LOSS:  200 mL   URINE OUTPUT:  250 mL   COMPLICATIONS:  None.   INDICATIONS FOR PROCEDURE:  The patient is a 77 year old para 2  Caucasian female who was referred by her oncologist, Dr. Arlan Organ  regarding pelvic pain and pressure and a CT scan documenting multiple  uterine fibroids.  The patient does have a history of bilateral breast  cancer and is status post bilateral lumpectomies with radiation therapy.   The patient has been receiving her gynecologic care from S. Kyra Manges, M.D.  The patient had a Pap smear performed in March 2008, she  reports that this was normal.  The patient does have evidence of a  second-degree cystocele, first to second-degree uterine prolapse, and a  first-degree rectocele.  As the patient is interested in proceeding with  removal of the uterus, tubes and ovaries and surgical treatment of the  prolapse, urodynamic testing with  reduction of the prolapse was  performed in the office and this documented the absence of occult  genuine stress incontinence.   A plan is made now to proceed with a laparoscopically assisted vaginal  hysterectomy with bilateral salpingo-oophorectomy, McCall's culdoplasty,  and the anterior and posterior colporrhaphy.  Risks, benefits, and  alternatives have been reviewed with the patient who wishes to proceed.   FINDINGS:  Examination under anesthesia reveals that a second-degree  cystocele, second-degree uterine prolapse, and a first-degree rectocele.  At the time of laparoscopy, the patient was noted to have a 2.5 cm right  posterior lower uterine segment fibroid which was intramural in nature.  The fallopian tubes and ovaries were unremarkable.  There was no  evidence of any adhesive disease in the abdomen or the pelvis.  There  were no excrescences or papillations of the peritoneal surfaces.  In the  upper abdomen the liver appeared to be normal.  The gallbladder was not  visualized well.  The stomach organ appeared to be unremarkable.   Cystoscopy demonstrated a normal bladder throughout 360 degrees  including the bladder dome and trigone.  There was no evidence of any  urethral nor  bladder polyp.  The ureters were noted to be patent  bilaterally after the injection of indigo carmine dye.   There was evidence on cervical exam of the 1.5 cm prolapsing cervical  fibroid versus endometrial polyp which had been previously noted in the  office and was too large and difficult for removal in an outpatient  setting.   SPECIMENS:  The uterus, tubes and ovaries were sent to pathology  together.   PROCEDURE:  The patient was reidentified in the preoperative hold area.  She received clindamycin 900 mg IV for antibiotic prophylaxis.  She  received both TED hose and PAS stockings for DVT prophylaxis.   In the operating room, general endotracheal anesthesia was induced and  the patient  was then placed in the dorsal lithotomy position.  The  abdomen and vagina were sterilely prepped and draped and a Foley  catheter was placed inside the bladder.   A speculum was placed inside the vagina and a single-tooth tenaculum was  placed on the anterior cervical lip.  This was replaced with a Hulka  tenaculum.   The laparoscopy began by creating a 1 cm umbilical incision with a  scalpel.  The incision was carried down to the fascia using an Allis  clamp.  A 10-mm trocar was inserted directly into the peritoneal cavity  without difficulty.  The laparoscope confirmed proper placement.  A CO2  pneumoperitoneum was achieved and the patient was placed in the  Trendelenburg position.  A 5 mm incision was created in the left lower  quadrants and in the right lower quadrant.  5 mm trocars were inserted  under direct visualization of the laparoscope through these incisions.  An inspection of the pelvic and abdominal organs was performed and the  findings are as noted above.   The procedure began by identifying the left ureter.  The left  infundibulopelvic ligament was then grasped with the gyrus instrument  and was cauterized and sharply divided.  The dissection continued  through the posterior leaf of the broad ligament again using the gyrus  instrument for cautery and cutting.  The left round ligament was then  similarly grasped, cauterized, and cut.  The dissection was carried  through the anterior leaf of the broad ligament in a similar fashion.  The bladder was partially dissected off of the lower uterine segment  using the gyrus instrument.  The same procedure that was performed on  the patient's left-hand side was then repeated on the patient's right-  hand side.  Again the right ureter was identified prior to beginning the  hysterectomy on the patient's right-hand side.  The posterior lower  uterine segment fibroid was encountered on the patient's right-hand side  and the  bladder flap could therefore not be taken down laparoscopically  on this side.  With good hemostasis noted at this time the remainder of  the procedure was then performed vaginally.  The laparoscope was removed  at this time.   The patient was placed in the high lithotomy position.  A weighted  speculum was placed inside the vagina and a tenaculum was placed on the  anterior and on the posterior cervical lips.  The cervical tissue was  noted to be friable.  The cervix was circumferentially injected with  half percent lidocaine with 1:200,000 of epinephrine.  The cervix was  then circumscribed with a scalpel.  The posterior cul-de-sac was entered  sharply with a Mayo scissors.  Digital exam confirmed proper entry into  this  location.  A long weighted speculum was then placed in the  posterior cul-de-sac.  Each of the uterosacral ligaments were then  clamped, sharply divided, and suture ligated with transfixing sutures of  0 Vicryl bilaterally.   The bladder was dissected off of the lower uterine segment anteriorly.  The inferior aspects of the cardinal ligaments were then clamped,  sharply divided, and suture ligated with 0 Vicryl bilaterally.  The  uterine arteries were then clamped, sharply divided, and suture ligated  with 0 Vicryl.  The uterus was inverted at this time and the remaining  portion of the broad ligament on the patient's left-hand side was  clamped and sharply divided and a free tie was placed around this  pedicle.  The patient's right side was noted to be completely free at  this time.  The specimen was sent to pathology.   The pedicle sites were all reexamined.  There was some bleeding noted  above the left uterosacral and this was grasped with a right-angle clamp  and was suture ligated with 0 Vicryl.   The posterior vaginal cuff was then whip stitched with a running locked  suture of 0 Vicryl.  The McCall culdoplasty suture of 0 Vicryl was  placed next.  The  suture was brought through the vagina and into the  posterior cul-de-sac at the 6 o'clock position, through the distal left  uterosacral ligament, across the posterior cul-de-sac in a pursestring  fashion, down through the distal right uterosacral ligament and then out  through the vagina again at the 6 o'clock position.   The anterior colporrhaphy was performed next.  Allis clamps were used to  mark the anterior vaginal wall in the midline.  The vaginal mucosa was  injected with half percent lidocaine with 1:200,000 of epinephrine.  A  vaginal mucosa was then incised vertically with a Metzenbaum scissors.  The bladder and the subvaginal tissue were sharply dissected off of the  vaginal mucosa bilaterally.  The dissection was carried back to just  anterior to the pubic rami.  Hemostasis during the dissection was  created with both monopolar cautery and with figure-of-eight sutures of  2-0 Vicryl.  The reduction of the cystocele was performed with vertical  mattress sutures of 2-0 Vicryl.  The Foley catheter was removed at this  time and cystoscopy was performed and the findings are as noted above.  The Foley catheter was then replaced and the cystoscopic fluid was  drained from the bladder.  The excess vaginal mucosa was then trimmed  and the anterior vaginal wall was closed with a running locked suture of  0 Vicryl.  This continued to also close the vaginal cuff with the same  suture.  The excess vaginal mucosa was trimmed and the anterior vaginal  wall was closed with a running lock suture of 0 Vicryl which continued  at the level of the vaginal cuff to close the vaginal cuff as well.   The posterior colporrhaphy was performed last.  Allis clamps were used  to mark the midline of the posterior vaginal wall.  The perineal body  and the posterior vaginal mucosa were injected with half percent  lidocaine with 1:200,000 of epinephrine.  A triangular wedge of  epithelium was excised from  the perineal body.  The posterior vaginal  wall was incised in the midline using a Metzenbaum scissors.  The  perirectal fascia was dissected off of the overlying vaginal mucosa  bilaterally up to the level of the cul-de-sac.  This hemostasis during  the dissection was created with monopolar cautery.  The rectocele was  then reduced using a combination of 0 Vicryl and 2-0 Vicryl sutures.  At  the top of the rectocele repair.  A pursestring suture of 2-0 Vicryl was  placed.  The vertical mattress sutures of 2-0 Vicryl then began and this  transitioned down to 0 Vicryl closer to the level of the perineal body.  Excess vaginal mucosa was trimmed and posterior vaginal wall was  similarly closed with a running locked suture of 0 Vicryl.  This  continued along the perineal body in a subcuticular fashion as for an  episiotomy repair.   The McCall culdoplasty suture was tied at this time and there was good  support and elevation of the vaginal cuff.   Rectal exam was performed which documented the absence of sutures in the  rectum.  A plain gauze packing was placed inside the vagina.   Final laparoscopy was performed.  The CO2 pneumoperitoneum was re-  achieved.  The pelvis irrigated and suctioned of Ringer's lactate  solution.  All operative sites and pedicles were noted to be hemostatic.   The 5 mm trocars were removed under visualization of the laparoscope.  The pneumoperitoneum was released and the umbilical trocar and  laparoscope were removed simultaneously.  The skin incisions were closed  with subcuticular sutures of 4-0 Vicryl.  Dermabond was placed over the  abdominal incisions after the patient was cleansed of remaining  Betadine.  This concluded the patient's procedure.  There were no  complications.  All needle, instrument, sponge counts were correct.  The  patient was escorted to the recovery room in stable and awake condition.      Randye Lobo, M.D.  Electronically  Signed     BES/MEDQ  D:  05/10/2007  T:  05/10/2007  Job:  [id number]   cc:   S. Kyra Manges, M.D.  Fax: [phone]   Rose Phi. Myna Hidalgo, M.D.  Fax: [phone]   Tera Mater. Evlyn Kanner, M.D.  Fax: [phone]   Billie Lade, M.D.  Fax: [phone]

## 2011-04-11 NOTE — H&P (Signed)
[facility]  Patient:    Hannah Galvan, Hannah Galvan                          MRN: [id number] Adm. Date:  07/21/00 Attending:  Esmeralda Arthur, M.D.                         History and Physical  HISTORY OF PRESENT ILLNESS:   This is a 77 year old female, para 2-0-2, who is admitted to the hospital for hysteroscopy, D&C, and possible resection. The patient has had breast cancer and has been on Tamoxifen and had an ultrasound which shows that she had a thick endometrium.  She had a catheterization and she had a thickened anterior and posterior endometrium with an 8 mm anterior myoma projecting into the endometrial cavity.  Her total endometrial thickness before the sonogram was 12.9 mm.  The patient has been on Tamoxifen for breast cancer.  ALLERGIES:                    CEFOTAN, SULFA DRUGS, CODEINE makes her mouth swell.  Her last Pap smear was April 24, 2000.  Her last mammogram was September of 2000.  She had a bone density in July.  Her last menstrual period was June of 2001.  She does have hot flushes.  She has a lot of vaginal dryness.  She has pain with intercourse and has tearing of the skin.  MEDICATIONS:                  1. Tamoxifen 10 mg two per day.                               2. Lipitor 20 mg.                               3. Synthroid 0.1 mg everyday.                               4. Univasc 15 mg a day for blood pressure.  REVIEW OF SYSTEMS:            Otherwise fairly noncontributory.  PERSONAL PAST HISTORY:        She has had breast cancer.  She is on thyroid. She takes medicine for blood pressure.  FAMILY HISTORY:               She had a brother that had breast cancer and a brother with colon cancer.  PAST SURGICAL HISTORY:        She had breast biopsy and treatment.  She had a tumor benign in 1967.  She has had an umbilical hernia repaired.  She had D&C x 2.  She has had wisdom teeth removed.  PHYSICAL EXAMINATION:  GENERAL:                       This is a well-developed, well-nourished female oriented and alert.  VITAL SIGNS:                  Blood pressure 120/70, weight 153 pounds.  NECK:  Thyroid is not enlarged.  LUNGS:                        Clear to auscultation and percussion.  HEART:                        Normal sinus rhythm.  BREASTS:                      Type IV without mass.  She has some tenderness from her surgery.  ABDOMEN:                      Her liver and spleen are not palpated.  PELVIC:                       External genitalia has thinning of the skin, particularly the posterior forchette.  She has anterior and posterior relaxation.  Her cervix looks fine.  The uterus basically feels normal size. She has no mass of the adnexa.  RECTOVAGINAL:                 Confirms.  IMPRESSION:                   Thickened endometrium on ultrasound with a fibroid, on Tamoxifen, postmenopausal, posttreatment of breast cancer with radiation and lymphadenectomy.  She is on Tamoxifen everyday, she is on Synthroid.  DISPOSITION:                  Admit for surgery. DD:  07/17/00 TD:  07/17/00 Job: [id number]

## 2011-04-14 ENCOUNTER — Encounter (HOSPITAL_BASED_OUTPATIENT_CLINIC_OR_DEPARTMENT_OTHER): Payer: Medicare Other | Admitting: Hematology & Oncology

## 2011-04-14 ENCOUNTER — Other Ambulatory Visit: Payer: Self-pay | Admitting: Hematology & Oncology

## 2011-04-14 DIAGNOSIS — D0590 Unspecified type of carcinoma in situ of unspecified breast: Secondary | ICD-10-CM

## 2011-04-14 DIAGNOSIS — R197 Diarrhea, unspecified: Secondary | ICD-10-CM

## 2011-04-14 DIAGNOSIS — C50419 Malignant neoplasm of upper-outer quadrant of unspecified female breast: Secondary | ICD-10-CM

## 2011-04-14 LAB — CBC WITH DIFFERENTIAL (CANCER CENTER ONLY)
BASO%: 0.4 % (ref 0.0–2.0)
HCT: 36.6 % (ref 34.8–46.6)
LYMPH#: 1.5 10*3/uL (ref 0.9–3.3)
MONO#: 0.7 10*3/uL (ref 0.1–0.9)
Platelets: 229 10*3/uL (ref 145–400)
RDW: 13 % (ref 11.1–15.7)
WBC: 7.6 10*3/uL (ref 3.9–10.0)

## 2011-04-15 LAB — COMPREHENSIVE METABOLIC PANEL
ALT: 21 U/L (ref 0–35)
AST: 25 U/L (ref 0–37)
Alkaline Phosphatase: 41 U/L (ref 39–117)
BUN: 12 mg/dL (ref 6–23)
CO2: 27 mEq/L (ref 19–32)
Chloride: 91 mEq/L — ABNORMAL LOW (ref 96–112)
Creatinine, Ser: 0.98 mg/dL (ref 0.40–1.20)
Glucose, Bld: 92 mg/dL (ref 70–99)
Potassium: 3.3 mEq/L — ABNORMAL LOW (ref 3.5–5.3)
Total Bilirubin: 0.5 mg/dL (ref 0.3–1.2)
Total Protein: 7.1 g/dL (ref 6.0–8.3)

## 2011-08-08 ENCOUNTER — Encounter (HOSPITAL_BASED_OUTPATIENT_CLINIC_OR_DEPARTMENT_OTHER): Payer: BC Managed Care – PPO | Admitting: Hematology & Oncology

## 2011-08-08 ENCOUNTER — Other Ambulatory Visit: Payer: Self-pay | Admitting: Hematology & Oncology

## 2011-08-08 DIAGNOSIS — D0590 Unspecified type of carcinoma in situ of unspecified breast: Secondary | ICD-10-CM

## 2011-08-08 DIAGNOSIS — Z23 Encounter for immunization: Secondary | ICD-10-CM

## 2011-08-08 DIAGNOSIS — C50419 Malignant neoplasm of upper-outer quadrant of unspecified female breast: Secondary | ICD-10-CM

## 2011-08-08 LAB — CBC WITH DIFFERENTIAL (CANCER CENTER ONLY)
BASO%: 0.7 % (ref 0.0–2.0)
HCT: 37.4 % (ref 34.8–46.6)
LYMPH%: 32.4 % (ref 14.0–48.0)
MCH: 32.5 pg (ref 26.0–34.0)
MCHC: 36.4 g/dL — ABNORMAL HIGH (ref 32.0–36.0)
MCV: 90 fL (ref 81–101)
MONO#: 0.7 10*3/uL (ref 0.1–0.9)
MONO%: 12 % (ref 0.0–13.0)
NEUT%: 52 % (ref 39.6–80.0)
RDW: 12.5 % (ref 11.1–15.7)
WBC: 5.9 10*3/uL (ref 3.9–10.0)

## 2011-08-09 LAB — COMPREHENSIVE METABOLIC PANEL
ALT: 24 U/L (ref 0–35)
CO2: 31 mEq/L (ref 19–32)
Creatinine, Ser: 0.85 mg/dL (ref 0.50–1.10)
Total Bilirubin: 0.5 mg/dL (ref 0.3–1.2)

## 2011-08-09 LAB — VITAMIN D 25 HYDROXY (VIT D DEFICIENCY, FRACTURES): Vit D, 25-Hydroxy: 68 ng/mL (ref 30–89)

## 2011-09-01 ENCOUNTER — Ambulatory Visit (INDEPENDENT_AMBULATORY_CARE_PROVIDER_SITE_OTHER): Payer: BC Managed Care – PPO | Admitting: Internal Medicine

## 2011-09-01 ENCOUNTER — Encounter: Payer: Self-pay | Admitting: Internal Medicine

## 2011-09-01 ENCOUNTER — Ambulatory Visit (HOSPITAL_BASED_OUTPATIENT_CLINIC_OR_DEPARTMENT_OTHER)
Admission: RE | Admit: 2011-09-01 | Discharge: 2011-09-01 | Disposition: A | Discharge status: Home / Self Care | Payer: Medicare Other | Source: Ambulatory Visit | Attending: Internal Medicine | Admitting: Internal Medicine

## 2011-09-01 DIAGNOSIS — I10 Essential (primary) hypertension: Secondary | ICD-10-CM | POA: Insufficient documentation

## 2011-09-01 DIAGNOSIS — R270 Ataxia, unspecified: Secondary | ICD-10-CM

## 2011-09-01 DIAGNOSIS — E079 Disorder of thyroid, unspecified: Secondary | ICD-10-CM | POA: Insufficient documentation

## 2011-09-01 DIAGNOSIS — C801 Malignant (primary) neoplasm, unspecified: Secondary | ICD-10-CM | POA: Insufficient documentation

## 2011-09-01 DIAGNOSIS — E785 Hyperlipidemia, unspecified: Secondary | ICD-10-CM | POA: Insufficient documentation

## 2011-09-01 DIAGNOSIS — R279 Unspecified lack of coordination: Secondary | ICD-10-CM

## 2011-09-01 DIAGNOSIS — R42 Dizziness and giddiness: Secondary | ICD-10-CM | POA: Insufficient documentation

## 2011-09-01 DIAGNOSIS — M1990 Unspecified osteoarthritis, unspecified site: Secondary | ICD-10-CM | POA: Insufficient documentation

## 2011-09-01 LAB — CBC WITH DIFFERENTIAL/PLATELET
Basophils Relative: 1 % (ref 0–1)
Eosinophils Absolute: 0.1 10*3/uL (ref 0.0–0.7)
MCH: 30.8 pg (ref 26.0–34.0)
MCHC: 33.1 g/dL (ref 30.0–36.0)
Neutro Abs: 3.1 10*3/uL (ref 1.7–7.7)
Neutrophils Relative %: 52 % (ref 43–77)
Platelets: 265 10*3/uL (ref 150–400)
RBC: 4.41 MIL/uL (ref 3.87–5.11)

## 2011-09-01 LAB — COMPREHENSIVE METABOLIC PANEL
ALT: 24 U/L (ref 0–35)
AST: 32 U/L (ref 0–37)
Alkaline Phosphatase: 48 U/L (ref 39–117)
Potassium: 4.2 mEq/L (ref 3.5–5.3)
Sodium: 135 mEq/L (ref 135–145)
Total Bilirubin: 0.5 mg/dL (ref 0.3–1.2)
Total Protein: 7.4 g/dL (ref 6.0–8.3)

## 2011-09-01 NOTE — Progress Notes (Signed)
Subjective:    Patient ID: Hannah Galvan, female    DOB: [birth year], 77 y.o.   MRN: [id number]  HPI  New pt here for first visit.  Former pt of Dr. Evlyn Kanner.    PMH of breast CA S/P  XRT, HTN, Hyperlipidemia, arthritis, and  Hypothyroidism.    She is concerned over a chronic issue of "Head feeling funny - sort of like dizziness and balance"  Symptoms off and on for about a year.  She did fall in Sept of last year.  She reports Dr. Evlyn Kanner wanted her to see a neurologist but office never called to set up referral.  She denies visual, speech changes.  No report of muscle weakness or numbness.  She denies chest pain or palpitations.  She denies syncopal symptoms but feels like she is off balance.  She did have a brain MRI 08/17/2010 that showed mild chronic sinus disease, mild cortical atrophy, and small vessel disease.  Specifically no evidence for metastatic spread.  Moderate superior vermian atrophy is repored that radiologist reports may be contributing to imbalance.      Pt wonders if her BP med may be a culprit to how she feels.  Last labs done 04/2011  TSH slightly low at .31 all other labs normal.  B12 high at 1999, Vit D high at 86  Allergies  Allergen Reactions  . Pyridium (Phenazopyridine Hcl) Other (See Comments)    hemolysis  . Sulfa Antibiotics Rash   Past Medical History  Diagnosis Date  . Arthritis   . Cancer     breast, s/p RXT  . Thyroid disease    Past Surgical History  Procedure Date  . Abdominal hysterectomy 2008  . Thyroid surgery 2002  . Breast lumpectomy 2000  . Breast lumpectomy 2007  . Cataract extraction 2011   History   Social History  . Marital Status: Married    Spouse Name: N/A    Number of Children: N/A  . Years of Education: N/A   Occupational History  . Not on file.   Social History Main Topics  . Smoking status: Never Smoker   . Smokeless tobacco: Never Used  . Alcohol Use: No  . Drug Use: No  . Sexually Active: Not Currently   Other Topics  Concern  . Not on file   Social History Narrative  . No narrative on file   Family History  Problem Relation Age of Onset  . Hypertension Mother   . Stroke Mother   . Cancer Father   . Cancer Sister     leukemia  . Cancer Brother     leukemia  . Cancer Sister     vaginal  . Cancer Sister     bladder  . Cancer Brother     prostate, skin  . Cancer Brother     colon  . Cancer Brother     prostate, bone mets  . Cancer Brother     skin  . Heart disease Sister    There is no problem list on file for this patient.  Current Outpatient Prescriptions on File Prior to Visit  Medication Sig Dispense Refill  . aspirin 81 MG tablet Take 81 mg by mouth daily.        . folic acid (FOLVITE) 1 MG tablet Take 1 mg by mouth daily.        Marland Kitchen levothyroxine (SYNTHROID) 125 MCG tablet Take 125 mcg by mouth daily.        Marland Kitchen  Multiple Vitamins-Minerals (CENTRUM SILVER PO) Take by mouth. CALTRATE       . rosuvastatin (CRESTOR) 10 MG tablet Take 10 mg by mouth daily.           Review of Systems    see HPI Objective:   Physical Exam Physical Exam  Nursing note and vitals reviewed.  Constitutional: She is oriented to person, place, and time. She appears well-developed and well-nourished.  HENT:  Head: Normocephalic and atraumatic.  Neck ?? Bruit on L  Cardiovascular: Normal rate and regular rhythm. Exam reveals no gallop and no friction rub.  No murmur heard.  Pulmonary/Chest: Breath sounds normal. She has no wheezes. She has no rales.  Neurological: She is alert and oriented to person, place, and time.  CN II-XII intact.  Cerebellar intact FTN, HTS.  Motor 5/5 UE and LE.  Sensory intact to pp.  Reflexes 2+ symmetric Skin: Skin is warm and dry.  Psychiatric: She has a normal mood and affect. Her behavior is normal.             Assessment & Plan:  1)  Subjective dizziness, ataxia.   With normal MRI,  Will get carotid U/S ,  She is not orthostatic on exam.  EKG no acute changes.   Check TSH labs today.  May need neurology opinion eventually.  May also benefit from PT for balance strenthening and fall prevention 2) Hypothyroidism  TSH on low side will recheck 3) DJD 4)Hyperlipidemia 5) HTN 6)  Breast CA  I spent 45 minutes with this pt.

## 2011-09-01 NOTE — Patient Instructions (Signed)
Get carotid ultrasound x-ray today  Labs done today will mail to you  See me in two weeks

## 2011-09-04 ENCOUNTER — Encounter: Payer: Self-pay | Admitting: Emergency Medicine

## 2011-09-08 ENCOUNTER — Encounter: Payer: Self-pay | Admitting: Internal Medicine

## 2011-09-09 ENCOUNTER — Encounter: Payer: Self-pay | Admitting: *Deleted

## 2011-09-10 LAB — COMPREHENSIVE METABOLIC PANEL
ALT: 20
AST: 29
CO2: 30
Calcium: 8 — ABNORMAL LOW
Creatinine, Ser: 0.79
GFR calc Af Amer: >60
GFR calc non Af Amer: >60
Sodium: 136
Total Protein: 5 — ABNORMAL LOW

## 2011-09-10 LAB — CBC
MCHC: 33.9
MCV: 98
Platelets: 171
RBC: 3.5 — ABNORMAL LOW
RDW: 13.4

## 2011-09-10 LAB — T4, FREE: Free T4: 1.16

## 2011-09-11 LAB — PROTIME-INR: Prothrombin Time: 13.7

## 2011-09-11 LAB — CBC
Hemoglobin: 13.9
MCV: 96.5
RBC: 4.21
WBC: 5.2

## 2011-09-11 LAB — URINALYSIS, ROUTINE W REFLEX MICROSCOPIC
Bilirubin Urine: NEGATIVE
Glucose, UA: NEGATIVE
Ketones, ur: NEGATIVE
pH: 6.5

## 2011-09-11 LAB — COMPREHENSIVE METABOLIC PANEL
ALT: 30
AST: 44 — ABNORMAL HIGH
CO2: 31
Chloride: 100
GFR calc Af Amer: >60
GFR calc non Af Amer: >60
Glucose, Bld: 93
Sodium: 137
Total Bilirubin: 0.6

## 2011-09-11 LAB — URINE MICROSCOPIC-ADD ON

## 2011-09-15 ENCOUNTER — Ambulatory Visit (INDEPENDENT_AMBULATORY_CARE_PROVIDER_SITE_OTHER)
Admission: RE | Admit: 2011-09-15 | Discharge: 2011-09-15 | Disposition: A | Discharge status: Home / Self Care | Payer: Medicare Other | Source: Ambulatory Visit | Attending: Internal Medicine | Admitting: Internal Medicine

## 2011-09-15 ENCOUNTER — Encounter: Payer: Self-pay | Admitting: Internal Medicine

## 2011-09-15 ENCOUNTER — Ambulatory Visit (HOSPITAL_BASED_OUTPATIENT_CLINIC_OR_DEPARTMENT_OTHER)
Admission: RE | Admit: 2011-09-15 | Discharge: 2011-09-15 | Disposition: A | Discharge status: Home / Self Care | Payer: Medicare Other | Source: Ambulatory Visit | Attending: Internal Medicine | Admitting: Internal Medicine

## 2011-09-15 ENCOUNTER — Ambulatory Visit (INDEPENDENT_AMBULATORY_CARE_PROVIDER_SITE_OTHER): Payer: Medicare Other | Admitting: Internal Medicine

## 2011-09-15 VITALS — BP 136/77 | HR 76 | Temp 97.6°F | Resp 16 | Ht 62.5 in | Wt 134.0 lb

## 2011-09-15 DIAGNOSIS — Z23 Encounter for immunization: Secondary | ICD-10-CM

## 2011-09-15 DIAGNOSIS — R609 Edema, unspecified: Secondary | ICD-10-CM

## 2011-09-15 DIAGNOSIS — M25569 Pain in unspecified knee: Secondary | ICD-10-CM

## 2011-09-15 DIAGNOSIS — M1710 Unilateral primary osteoarthritis, unspecified knee: Secondary | ICD-10-CM | POA: Insufficient documentation

## 2011-09-15 DIAGNOSIS — R600 Localized edema: Secondary | ICD-10-CM | POA: Insufficient documentation

## 2011-09-15 DIAGNOSIS — E785 Hyperlipidemia, unspecified: Secondary | ICD-10-CM

## 2011-09-15 DIAGNOSIS — Z139 Encounter for screening, unspecified: Secondary | ICD-10-CM

## 2011-09-15 DIAGNOSIS — M7989 Other specified soft tissue disorders: Secondary | ICD-10-CM

## 2011-09-15 DIAGNOSIS — M79609 Pain in unspecified limb: Secondary | ICD-10-CM

## 2011-09-15 MED ORDER — OMEPRAZOLE 20 MG PO CPDR: 20.0000 mg | DELAYED_RELEASE_CAPSULE | Freq: Every day | ORAL | Status: DC

## 2011-09-15 MED ORDER — CELECOXIB 100 MG PO CAPS: 100.0000 mg | ORAL_CAPSULE | Freq: Two times a day (BID) | ORAL | Status: AC

## 2011-09-15 NOTE — Patient Instructions (Signed)
To have venous ultrasound and knee x-rays today  At 2 pm  Take Celebrex 100mg  daily with Prilosec to protect your stomach  See me in 4-6 weeks

## 2011-09-15 NOTE — Progress Notes (Signed)
Subjective:    Patient ID: Hannah Galvan, female    DOB: 07-13-1931, 77 y.o.   MRN: [id number]  HPI  Jeremiah is here for follow up.  She tells me her dizzinness is much better but still comes at times.  She is concerned over pain in her R hip, both knees and feet.   She also has swelling in lower legs  Rside < her left.  She is concerned over blood clots in her legs.  She takes occasional Advil for her pain but not consistantly  Allergies  Allergen Reactions  . Pyridium (Phenazopyridine Hcl) Other (See Comments)    hemolysis  . Sulfa Antibiotics Rash   Past Medical History  Diagnosis Date  . Thyroid disease   . Hypertension   . Hyperlipidemia   . Arthritis   . Cancer     breast, s/p RXT   Past Surgical History  Procedure Date  . Abdominal hysterectomy 2008  . Thyroid surgery 2002  . Breast lumpectomy 2000  . Breast lumpectomy 2007  . Cataract extraction 2011   History   Social History  . Marital Status: Married    Spouse Name: N/A    Number of Children: N/A  . Years of Education: N/A   Occupational History  . Not on file.   Social History Main Topics  . Smoking status: Never Smoker   . Smokeless tobacco: Never Used  . Alcohol Use: No  . Drug Use: No  . Sexually Active: Not Currently   Other Topics Concern  . Not on file   Social History Narrative  . No narrative on file   Family History  Problem Relation Age of Onset  . Hypertension Mother   . Stroke Mother   . Cancer Father   . Cancer Sister     leukemia  . Cancer Brother     leukemia  . Cancer Sister     vaginal  . Cancer Sister     bladder  . Cancer Brother     prostate, skin  . Cancer Brother     colon  . Cancer Brother     prostate, bone mets  . Cancer Brother     skin  . Heart disease Sister    Patient Active Problem List  Diagnoses  . Thyroid disease  . Hypertension  . Hyperlipidemia  . Arthritis  . Cancer  . Edema of both legs   Current Outpatient Prescriptions on File  Prior to Visit  Medication Sig Dispense Refill  . aspirin 81 MG tablet Take 81 mg by mouth daily.        . Calcium Carbonate-Vitamin D (CALCIUM + D) 600-200 MG-UNIT TABS Take 1 tablet by mouth 2 (two) times daily.        . Coenzyme Q10 (CO Q 10) 100 MG CAPS Take 1 capsule by mouth daily.        . Cyanocobalamin (VITAMIN B-12) 5000 MCG SUBL Take 1 tablet by mouth daily.        Marland Kitchen exemestane (AROMASIN) 25 MG tablet Take 25 mg by mouth daily after breakfast.        . folic acid (FOLVITE) 1 MG tablet Take 1 mg by mouth daily.        Marland Kitchen levothyroxine (SYNTHROID) 125 MCG tablet Take 125 mcg by mouth daily.        Marland Kitchen LORazepam (ATIVAN) 1 MG tablet Take 1 mg by mouth at bedtime.        Marland Kitchen losartan-hydrochlorothiazide (  HYZAAR) 100-25 MG per tablet Take 1 tablet by mouth daily.        . Magnesium 500 MG CAPS Take 1 capsule by mouth daily.        Carlene Coria Mushroom POWD Take 30 mg by mouth 2 (two) times daily.        . Multiple Vitamins-Minerals (CENTRUM SILVER PO) Take by mouth. CALTRATE       . Omega-3 Fatty Acids (FISH OIL) 1000 MG CAPS Take 1 capsule by mouth daily.        . rosuvastatin (CRESTOR) 10 MG tablet Take 10 mg by mouth daily.                Review of Systems See HPI    Objective:   Physical Exam Physical Exam  Nursing note and vitals reviewed.  Constitutional: She is oriented to person, place, and time. She appears well-developed and well-nourished.  HENT:  Head: Normocephalic and atraumatic.  Cardiovascular: Normal rate and regular rhythm. Exam reveals no gallop and no friction rub.  No murmur heard.  Pulmonary/Chest: Breath sounds normal. She has no wheezes. She has no rales.  Neurological: She is alert and oriented to person, place, and time.  Skin: Skin is warm and dry.  Psychiatric: She has a normal mood and affect. Her behavior is normal.  ExtL  She does have 1+ edema in R leg .  Trace edema in L  Extensive venous variocosities Joints:  [place]berden;s Bouchards changes in  hands.  Crepitus with ROM both knees.  No pain to f/e or I/E rotation of R hip        Assessment & Plan:  1)  Arthralgis  Will get knee X-rays today.  Ok to Rx Celebrex  100 mg ddaily.  With Omeprazole daily  Counseled of SE of  Gi bleeding and renal dysfunction  She is to stop Celebrex if any Dyspepsia She voicecs understanding 2)  Dizziness Subjectively improved 3)  Le edema  R > L  Will get venous doppler today 4)  HTN WEll controlled  Will also give TDAP today  See me in 1-2 months

## 2011-09-16 ENCOUNTER — Encounter: Payer: Self-pay | Admitting: Emergency Medicine

## 2011-09-16 LAB — LIPID PANEL
Cholesterol: 164 mg/dL (ref 0–200)
HDL: 56 mg/dL (ref >39)
Triglycerides: 84 mg/dL (ref <150)

## 2011-09-18 ENCOUNTER — Other Ambulatory Visit: Payer: Self-pay | Admitting: Hematology & Oncology

## 2011-09-18 DIAGNOSIS — Z853 Personal history of malignant neoplasm of breast: Secondary | ICD-10-CM

## 2011-10-06 ENCOUNTER — Ambulatory Visit
Admission: RE | Admit: 2011-10-06 | Discharge: 2011-10-06 | Disposition: A | Discharge status: Home / Self Care | Payer: BC Managed Care – PPO | Source: Ambulatory Visit | Attending: Hematology & Oncology | Admitting: Hematology & Oncology

## 2011-10-06 DIAGNOSIS — Z853 Personal history of malignant neoplasm of breast: Secondary | ICD-10-CM

## 2011-10-14 ENCOUNTER — Encounter (INDEPENDENT_AMBULATORY_CARE_PROVIDER_SITE_OTHER): Payer: Self-pay | Admitting: General Surgery

## 2011-10-23 ENCOUNTER — Ambulatory Visit: Payer: BC Managed Care – PPO | Admitting: Hematology & Oncology

## 2011-10-27 ENCOUNTER — Ambulatory Visit: Payer: BC Managed Care – PPO | Admitting: Internal Medicine

## 2011-11-06 ENCOUNTER — Telehealth: Payer: Self-pay | Admitting: *Deleted

## 2011-11-06 NOTE — Telephone Encounter (Signed)
Pt moved 12-14 to 1-28

## 2011-11-07 ENCOUNTER — Ambulatory Visit: Payer: BC Managed Care – PPO | Admitting: Hematology & Oncology

## 2011-11-10 ENCOUNTER — Other Ambulatory Visit: Payer: Self-pay | Admitting: *Deleted

## 2011-11-10 DIAGNOSIS — C50419 Malignant neoplasm of upper-outer quadrant of unspecified female breast: Secondary | ICD-10-CM

## 2011-11-10 MED ORDER — EXEMESTANE 25 MG PO TABS: 25.0000 mg | ORAL_TABLET | Freq: Every day | ORAL | Status: DC

## 2011-11-27 ENCOUNTER — Other Ambulatory Visit: Payer: Self-pay | Admitting: Hematology & Oncology

## 2011-11-27 ENCOUNTER — Ambulatory Visit (HOSPITAL_BASED_OUTPATIENT_CLINIC_OR_DEPARTMENT_OTHER): Payer: Medicare Other | Admitting: Lab

## 2011-11-27 DIAGNOSIS — C50419 Malignant neoplasm of upper-outer quadrant of unspecified female breast: Secondary | ICD-10-CM

## 2011-11-27 DIAGNOSIS — D550 Anemia due to glucose-6-phosphate dehydrogenase [G6PD] deficiency: Secondary | ICD-10-CM

## 2011-11-27 DIAGNOSIS — D0590 Unspecified type of carcinoma in situ of unspecified breast: Secondary | ICD-10-CM

## 2011-11-27 LAB — CBC WITH DIFFERENTIAL (CANCER CENTER ONLY)
BASO%: 0.9 % (ref 0.0–2.0)
HCT: 38.3 % (ref 34.8–46.6)
LYMPH%: 32.1 % (ref 14.0–48.0)
MCH: 32.2 pg (ref 26.0–34.0)
MCHC: 35.5 g/dL (ref 32.0–36.0)
MCV: 91 fL (ref 81–101)
MONO#: 0.9 10*3/uL (ref 0.1–0.9)
MONO%: 12.2 % (ref 0.0–13.0)
NEUT%: 51.6 % (ref 39.6–80.0)
RDW: 12.8 % (ref 11.1–15.7)
WBC: 7 10*3/uL (ref 3.9–10.0)

## 2011-12-02 ENCOUNTER — Other Ambulatory Visit: Payer: Self-pay | Admitting: Orthopedic Surgery

## 2011-12-02 DIAGNOSIS — M25561 Pain in right knee: Secondary | ICD-10-CM

## 2011-12-03 ENCOUNTER — Encounter (INDEPENDENT_AMBULATORY_CARE_PROVIDER_SITE_OTHER): Payer: Self-pay | Admitting: General Surgery

## 2011-12-03 ENCOUNTER — Ambulatory Visit (INDEPENDENT_AMBULATORY_CARE_PROVIDER_SITE_OTHER): Payer: Medicare Other | Admitting: General Surgery

## 2011-12-03 VITALS — BP 132/72 | HR 80 | Temp 98.5°F | Resp 16 | Ht 63.0 in | Wt 132.0 lb

## 2011-12-03 DIAGNOSIS — C50919 Malignant neoplasm of unspecified site of unspecified female breast: Secondary | ICD-10-CM | POA: Insufficient documentation

## 2011-12-03 NOTE — Patient Instructions (Signed)
Continue regular self exams  

## 2011-12-05 ENCOUNTER — Ambulatory Visit
Admission: RE | Admit: 2011-12-05 | Discharge: 2011-12-05 | Disposition: A | Discharge status: Home / Self Care | Payer: Medicare Other | Source: Ambulatory Visit | Attending: Orthopedic Surgery | Admitting: Orthopedic Surgery

## 2011-12-05 DIAGNOSIS — M25561 Pain in right knee: Secondary | ICD-10-CM

## 2011-12-10 ENCOUNTER — Encounter (INDEPENDENT_AMBULATORY_CARE_PROVIDER_SITE_OTHER): Payer: Self-pay | Admitting: General Surgery

## 2011-12-10 NOTE — Progress Notes (Signed)
Subjective:     Patient ID: Hannah Galvan, female   DOB: Nov 19, 1931, 78 y.o.   MRN: [id number]  HPI The patient is a 78 year old white female who is now 5 years out from a left breast lumpectomy for DCIS and 12 years out from a right lumpectomy and axillary node dissection for a T1 N1 right breast cancer. Since her last visit she has had very few problems. Her diarrhea seems to have resolved. She denies any breast pain or discharge from her nipple.  Review of Systems  Constitutional: Negative.   HENT: Negative.   Eyes: Negative.   Respiratory: Negative.   Cardiovascular: Negative.   Gastrointestinal: Negative.   Genitourinary: Negative.   Musculoskeletal: Negative.   Skin: Negative.   Neurological: Negative.   Hematological: Negative.   Psychiatric/Behavioral: Negative.        Objective:   Physical Exam  Constitutional: She is oriented to person, place, and time. She appears well-developed and well-nourished.  HENT:  Head: Normocephalic and atraumatic.  Eyes: Conjunctivae and EOM are normal. Pupils are equal, round, and reactive to light.  Neck: Normal range of motion. Neck supple.  Cardiovascular: Normal rate, regular rhythm and normal heart sounds.   Pulmonary/Chest: Effort normal and breath sounds normal.       No palpable mass in either breast. No axillary supraclavicular or cervical lymphadenopathy.  Abdominal: Soft. Bowel sounds are normal. She exhibits no mass. There is no tenderness.  Musculoskeletal: Normal range of motion.  Lymphadenopathy:    She has no cervical adenopathy.  Neurological: She is alert and oriented to person, place, and time.  Skin: Skin is warm and dry.  Psychiatric: She has a normal mood and affect. Her behavior is normal.       Assessment:     5 years out from a left breast lumpectomy and 12 years out from a right lumpectomy and axillary node dissection    Plan:     Overall she seems to be doing very well. She will continue to do regular  self exams. We will plan to see her back in one year

## 2011-12-21 ENCOUNTER — Encounter: Payer: Self-pay | Admitting: Internal Medicine

## 2011-12-21 DIAGNOSIS — D1803 Hemangioma of intra-abdominal structures: Secondary | ICD-10-CM | POA: Insufficient documentation

## 2011-12-21 DIAGNOSIS — M8580 Other specified disorders of bone density and structure, unspecified site: Secondary | ICD-10-CM | POA: Insufficient documentation

## 2011-12-21 DIAGNOSIS — K8010 Calculus of gallbladder with chronic cholecystitis without obstruction: Secondary | ICD-10-CM | POA: Insufficient documentation

## 2011-12-21 DIAGNOSIS — R269 Unspecified abnormalities of gait and mobility: Secondary | ICD-10-CM | POA: Insufficient documentation

## 2011-12-21 DIAGNOSIS — R911 Solitary pulmonary nodule: Secondary | ICD-10-CM | POA: Insufficient documentation

## 2011-12-22 ENCOUNTER — Ambulatory Visit (HOSPITAL_BASED_OUTPATIENT_CLINIC_OR_DEPARTMENT_OTHER): Payer: Medicare Other | Admitting: Hematology & Oncology

## 2011-12-22 ENCOUNTER — Telehealth: Payer: Self-pay | Admitting: Internal Medicine

## 2011-12-22 VITALS — BP 147/77 | HR 76 | Temp 97.1°F | Ht 63.0 in | Wt 133.0 lb

## 2011-12-22 DIAGNOSIS — C50919 Malignant neoplasm of unspecified site of unspecified female breast: Secondary | ICD-10-CM

## 2011-12-22 DIAGNOSIS — M810 Age-related osteoporosis without current pathological fracture: Secondary | ICD-10-CM

## 2011-12-22 DIAGNOSIS — D0590 Unspecified type of carcinoma in situ of unspecified breast: Secondary | ICD-10-CM

## 2011-12-22 DIAGNOSIS — Z853 Personal history of malignant neoplasm of breast: Secondary | ICD-10-CM

## 2011-12-22 DIAGNOSIS — R911 Solitary pulmonary nodule: Secondary | ICD-10-CM

## 2011-12-22 NOTE — Progress Notes (Signed)
This office note has been dictated.

## 2011-12-22 NOTE — Telephone Encounter (Signed)
Review of old records shows Ct done 01/16/10 shows 9 mm ground glass nodule subpleural RLL.  I spoke with pt about repeat CT and she recalls speaking with Dr. Myna Hidalgo her oncologist who felt it was not necessary at this point in time.  I spoke with Dr. Myna Hidalgo and he will discuss with pt at her appt. In his office today.  Pt voices understanding.    She is scheduled for knee surgery with Dr. Lajoyce Corners on 2/8  I advised her to see me in office after her recovery from surgery.  She voices understanding.

## 2011-12-23 NOTE — Progress Notes (Signed)
DIAGNOSIS: 1. Ductal carcinoma in situ of the left breast. 2. History of stage IIA (T1 N1 M0) ductal carcinoma of the right     breast.  CURRENT THERAPY:  Aromasin 25 mg p.o. daily.  INTERIM HISTORY:  Hannah Galvan comes in for her followup.  She is doing okay.  Her main problem has been her husband.  He is just old and cranky.  He does have myeloma.  He is being treated for this.  He does have other health issues.  He is having problems with his breathing.  He refused to be seen for this.  Hannah Galvan has "aches and pains."  She was worried about being on Crestor.  She had a lot of arthralgias because of this.  She is off Crestor now.  She is feeling better.  She has not noted any problems with fevers, sweats, or chills. There has been no bleeding.  He has had no nausea or vomiting.  Apparently, she does have occasional headaches.  There is no problems with double vision or blurred vision.  PHYSICAL EXAM:  General: This is a petite white female in no obvious distress.  Vital signs: Show a temperature of 97.1, pulse 76, heart rate 16, blood pressure 140/78, and weight is 133. Head and neck exam shows a normocephalic atraumatic skull.  There are no ocular or oral lesions. There are no palpable cervical or supraclavicular lymph nodes.  Lungs are clear bilaterally.  Cardiac examination:  Regular rate and rhythm with a normal S1 and S2.  No murmurs, rubs, or bruits.  Abdominal exam: Soft with good bowel sounds.  There is no palpable abdominal mass. There is no fluid wave.  There is no palpable hepatosplenomegaly. Breast exam: Shows right breast with a well-healed lumpectomy at the 11 o'clock position.  There is some slight contraction of the right breast. No obvious masses noted in the right breast.  No right axillary adenopathy.  Left breast shows a well-healed lumpectomy at the 4 o'clock position.  There is some slight firmness at the lumpectomy site.  No obvious masses noted in the left  breast.  There is no left axillary adenopathy.  Back exam: Shows some slight kyphosis.  There is no tenderness.  Tenderness over the spine, ribs, or hips.  Extremities: Shows no clubbing, cyanosis, or edema.  She has some osteoarthritic changes in her joints.  LABORATORY:  Were not done this visit as they were just done a couple of weeks ago.  IMPRESSION:  Hannah Galvan is a 78 year old white female with a remote history of stage IIA ductal carcinoma of the right breast.  She was diagnosed back, I think, in 2000.  She has done well with this.  So far, there has been no evidence of recurrence.  She then developed the carcinoma in situ of the left breast.  Again, this has not been a problem for her.  We will follow up on her CT scan was done back in May 2011.  I cannot imagine this lung nodule being an issue.  However, we probably need to address this.  We will plan to get her back to see [place] in another 6 months for followup.  ADDENDUM:  She is going to have knee surgery for her, I think, right knee. This will be in February.  I do not see any problems with her having surgery from a perspective from her past cancer history.    ______________________________ Josph Macho, M.D. PRE/MEDQ  D:  12/22/2011  T:  12/23/2011  Job:  [id number]

## 2011-12-31 ENCOUNTER — Ambulatory Visit (HOSPITAL_BASED_OUTPATIENT_CLINIC_OR_DEPARTMENT_OTHER)
Admission: RE | Admit: 2011-12-31 | Discharge: 2011-12-31 | Disposition: A | Discharge status: Home / Self Care | Payer: Medicare Other | Source: Ambulatory Visit | Attending: Hematology & Oncology | Admitting: Hematology & Oncology

## 2011-12-31 DIAGNOSIS — R911 Solitary pulmonary nodule: Secondary | ICD-10-CM

## 2011-12-31 DIAGNOSIS — J984 Other disorders of lung: Secondary | ICD-10-CM | POA: Insufficient documentation

## 2011-12-31 DIAGNOSIS — C50919 Malignant neoplasm of unspecified site of unspecified female breast: Secondary | ICD-10-CM

## 2012-02-09 ENCOUNTER — Telehealth: Payer: Self-pay | Admitting: Internal Medicine

## 2012-02-09 NOTE — Telephone Encounter (Signed)
Spoke with Hannah Galvan.  She states that symptoms began on Friday night.  She is very hoarse, coughing and has a sore throat that comes and goes- worse at night.  She states that the cough is keeping her up at night, she had to sleep in the recliner last night due to the coughing.  She denies any fevers that she can tell.  She is occasionally getting up yellow sputum.  She would like something for the cough and [facility]  She is aware [facility] will not call in antibiotic without first seeing her in the office.  Appt scheduled for tomorrow morning at 1115am per pt request.

## 2012-02-09 NOTE — Telephone Encounter (Signed)
Pt would like to have something called in the pharmacy [facility] [address]. She states she is coughing up yellow stuff and she have a sore throat. Pt was advise that info will be passed on. If any questions please call pt at [phone]. Thanks

## 2012-02-10 ENCOUNTER — Ambulatory Visit (INDEPENDENT_AMBULATORY_CARE_PROVIDER_SITE_OTHER): Payer: Medicare Other | Admitting: Internal Medicine

## 2012-02-10 ENCOUNTER — Encounter: Payer: Self-pay | Admitting: Internal Medicine

## 2012-02-10 VITALS — BP 97/55 | HR 79 | Temp 97.0°F | Resp 12 | Ht 61.5 in | Wt 131.1 lb

## 2012-02-10 DIAGNOSIS — J40 Bronchitis, not specified as acute or chronic: Secondary | ICD-10-CM

## 2012-02-10 DIAGNOSIS — J329 Chronic sinusitis, unspecified: Secondary | ICD-10-CM

## 2012-02-10 MED ORDER — CEFTRIAXONE SODIUM 1 G IJ SOLR
500.0000 mg | Freq: Once | INTRAMUSCULAR | Status: AC
  Administered 2012-02-10: 500 mg via INTRAMUSCULAR

## 2012-02-10 MED ORDER — LEVOFLOXACIN 500 MG PO TABS: 500.0000 mg | ORAL_TABLET | Freq: Every day | ORAL | Status: DC

## 2012-02-10 MED ORDER — HYDROCOD POLST-CHLORPHEN POLST 10-8 MG/5ML PO LQCR: ORAL | Status: DC

## 2012-02-10 NOTE — Progress Notes (Signed)
Subjective:    Patient ID: Hannah Galvan, female    DOB: 1931/04/21, 78 y.o.   MRN: [id number]  HPI Acute visit.  10 days of congestgion  Now cough of yellow mucous.  No fever or chest pain or SOB.    She did see Dr. Myna Hidalgo who repeated her Chest CT for RLL nodule an dno change is size  Allergies  Allergen Reactions  . Pyridium (Phenazopyridine Hcl) Other (See Comments)    hemolysis  . Sulfa Antibiotics Rash   Past Medical History  Diagnosis Date  . Thyroid disease   . Hypertension   . Hyperlipidemia   . Arthritis   . Cancer     breast, s/p RXT, bilateral   Past Surgical History  Procedure Date  . Abdominal hysterectomy 2008  . Thyroid surgery 2002  . Breast lumpectomy 2000  . Breast lumpectomy 2007  . Cataract extraction 2011  . Rotator cuff repair     right   History   Social History  . Marital Status: Married    Spouse Name: N/A    Number of Children: N/A  . Years of Education: N/A   Occupational History  . Not on file.   Social History Main Topics  . Smoking status: Never Smoker   . Smokeless tobacco: Never Used  . Alcohol Use: No  . Drug Use: No  . Sexually Active: Not Currently   Other Topics Concern  . Not on file   Social History Narrative  . No narrative on file   Family History  Problem Relation Age of Onset  . Hypertension Mother   . Stroke Mother   . Cancer Father   . Cancer Sister     leukemia  . Cancer Brother     leukemia  . Cancer Sister     vaginal  . Cancer Sister     bladder  . Cancer Brother     prostate, skin  . Cancer Brother     colon  . Cancer Brother     prostate, bone mets  . Cancer Brother     skin  . Heart disease Sister    Patient Active Problem List  Diagnoses  . Thyroid disease  . Hypertension  . Hyperlipidemia  . Arthritis  . Cancer  . Edema of both legs  . Breast cancer  . Gait disturbance  . Osteopenia  . Cholelithiasis with cholecystitis without obstruction  . Liver hemangioma  .  Pulmonary nodule, right   Current Outpatient Prescriptions on File Prior to Visit  Medication Sig Dispense Refill  . aspirin 81 MG tablet Take 81 mg by mouth daily.        . Calcium Carbonate-Vitamin D (CALCIUM + D) 600-200 MG-UNIT TABS Take 1 tablet by mouth 2 (two) times daily.        . Coenzyme Q10 (CO Q 10) 100 MG CAPS Take 1 capsule by mouth daily.        . Cyanocobalamin (VITAMIN B-12) 5000 MCG SUBL Take 1 tablet by mouth daily.        Marland Kitchen exemestane (AROMASIN) 25 MG tablet Take 1 tablet (25 mg total) by mouth daily after breakfast.  90 tablet  4  . folic acid (FOLVITE) 1 MG tablet Take 1 mg by mouth daily.        Marland Kitchen levothyroxine (SYNTHROID) 125 MCG tablet Take 125 mcg by mouth daily.        Marland Kitchen LORazepam (ATIVAN) 1 MG tablet Take 1 mg by  mouth at bedtime.        Marland Kitchen losartan-hydrochlorothiazide (HYZAAR) 100-25 MG per tablet Take 1 tablet by mouth daily.        . Magnesium 500 MG CAPS Take 1 capsule by mouth daily.        Carlene Coria Mushroom POWD Take 30 mg by mouth 2 (two) times daily.        . Multiple Vitamins-Minerals (CENTRUM SILVER PO) Take by mouth. CALTRATE       . Omega-3 Fatty Acids (FISH OIL) 1000 MG CAPS Take 1 capsule by mouth daily.        Marland Kitchen omeprazole (PRILOSEC) 20 MG capsule Take 1 capsule (20 mg total) by mouth daily.  30 capsule  1  . rosuvastatin (CRESTOR) 10 MG tablet Take 10 mg by mouth daily.             Review of Systems    see HPI Objective:   Physical Exam  Physical Exam  Constitutional: She is oriented to person, place, and time. She appears well-developed and well-nourished. She is cooperative.  HENT:  Head: Normocephalic and atraumatic.  Right Ear: A middle ear effusion is present.  Left Ear: A middle ear effusion is present.  Nose: Mucosal edema present. Right sinus exhibits maxillary sinus tenderness. Left sinus exhibits maxillary sinus tenderness.  Mouth/Throat: Posterior oropharyngeal erythema present.  Serous effusion bilaterally  Eyes:  Conjunctivae and EOM are normal. Pupils are equal, round, and reactive to light.  Neck: Neck supple. Carotid bruit is not present. No mass present.  Cardiovascular: Regular rhythm, normal heart sounds, intact distal pulses and normal pulses. Exam reveals no gallop and no friction rub.  No murmur heard.  Pulmonary/Chest: Breath sounds normal. She has no wheezes. She has no rhonchi. She has no rales.  Neurological: She is alert and oriented to person, place, and time.  Skin: Skin is warm and dry. No abrasion, no bruising, no ecchymosis and no rash noted. No cyanosis. Nails show no clubbing.  Psychiatric: She has a normal mood and affect. Her speech is normal and behavior is normal.            Assessment & Plan:  1)  Bronchitis cough  Will give Rocephin 500 mg,  tussionex and doxycycline 2)  RLL nodule  No change Recommeded repeat in one year

## 2012-02-10 NOTE — Patient Instructions (Signed)
Take meds as prescribed  Call office in 2 days to report condition

## 2012-02-11 ENCOUNTER — Telehealth: Payer: Self-pay | Admitting: Emergency Medicine

## 2012-02-11 DIAGNOSIS — J40 Bronchitis, not specified as acute or chronic: Secondary | ICD-10-CM

## 2012-02-11 MED ORDER — LEVOFLOXACIN 500 MG PO TABS: 500.0000 mg | ORAL_TABLET | Freq: Every day | ORAL | Status: AC

## 2012-02-11 NOTE — Progress Notes (Signed)
Addended by: Chip Boer on: 02/11/2012 08:55 AM   Modules accepted: Orders

## 2012-02-11 NOTE — Telephone Encounter (Signed)
Spoke with Hannah Galvan.  She did get antibiotic filled this morning.  Agreeable to appt tomorrow afternoon and CXR before.  Appt scheduled for 3pm.  Order entered for CXR

## 2012-02-11 NOTE — Telephone Encounter (Signed)
As per telephone discussion  Add pt to schedule tomorrow and we can get CXR prior to appt.  She had Rocephin in office.

## 2012-02-11 NOTE — Telephone Encounter (Signed)
Hannah Galvan called this morning stating her medication did not get sent to the pharmacy.  I looked and it was sent to [facility], so [facility] and advised Hannah Galvan to pick it up this morning, she is agreeable.  She states "I just feel terrible" and is concerned because she her begun to have burning in her chest when she coughs.  She is concerned about developing pneumonia.  She questions whether or not she needs to have a chest x-ray done or not?  I advised her I would send the message to [facility] this morning regarding CXR and call her back with her recommendation.  She is agreeable

## 2012-02-12 ENCOUNTER — Ambulatory Visit (INDEPENDENT_AMBULATORY_CARE_PROVIDER_SITE_OTHER): Payer: Medicare Other | Admitting: Internal Medicine

## 2012-02-12 ENCOUNTER — Encounter: Payer: Self-pay | Admitting: Internal Medicine

## 2012-02-12 ENCOUNTER — Ambulatory Visit (HOSPITAL_BASED_OUTPATIENT_CLINIC_OR_DEPARTMENT_OTHER)
Admission: RE | Admit: 2012-02-12 | Discharge: 2012-02-12 | Disposition: A | Discharge status: Home / Self Care | Payer: Medicare Other | Source: Ambulatory Visit | Attending: Internal Medicine | Admitting: Internal Medicine

## 2012-02-12 VITALS — BP 136/76 | HR 91 | Temp 97.1°F | Ht 61.5 in | Wt 130.0 lb

## 2012-02-12 DIAGNOSIS — M949 Disorder of cartilage, unspecified: Secondary | ICD-10-CM

## 2012-02-12 DIAGNOSIS — I279 Pulmonary heart disease, unspecified: Secondary | ICD-10-CM

## 2012-02-12 DIAGNOSIS — J40 Bronchitis, not specified as acute or chronic: Secondary | ICD-10-CM | POA: Insufficient documentation

## 2012-02-12 MED ORDER — CEFTRIAXONE SODIUM 1 G IJ SOLR
500.0000 mg | Freq: Once | INTRAMUSCULAR | Status: AC
  Administered 2012-02-12: 500 mg via INTRAMUSCULAR

## 2012-02-12 MED ORDER — ALBUTEROL SULFATE (2.5 MG/3ML) 0.083% IN NEBU
2.5000 mg | INHALATION_SOLUTION | Freq: Once | RESPIRATORY_TRACT | Status: AC
  Administered 2012-02-12: 2.5 mg via RESPIRATORY_TRACT

## 2012-02-12 MED ORDER — ALBUTEROL SULFATE HFA 108 (90 BASE) MCG/ACT IN AERS: 2.0000 | INHALATION_SPRAY | Freq: Four times a day (QID) | RESPIRATORY_TRACT | Status: DC | PRN

## 2012-02-12 NOTE — Progress Notes (Signed)
Subjective:    Patient ID: Hannah Galvan, female    DOB: August 15, 1931, 78 y.o.   MRN: [id number]  HPI Hannah Galvan is here for follow up.  Some SOB no chest pain  CXR neg  No fever.  Hard to cough up mucous  Allergies  Allergen Reactions  . Pyridium (Phenazopyridine Hcl) Other (See Comments)    hemolysis  . Sulfa Antibiotics Rash   Past Medical History  Diagnosis Date  . Thyroid disease   . Hypertension   . Hyperlipidemia   . Arthritis   . Cancer     breast, s/p RXT, bilateral   Past Surgical History  Procedure Date  . Abdominal hysterectomy 2008  . Thyroid surgery 2002  . Breast lumpectomy 2000  . Breast lumpectomy 2007  . Cataract extraction 2011  . Rotator cuff repair     right   History   Social History  . Marital Status: Married    Spouse Name: N/A    Number of Children: N/A  . Years of Education: N/A   Occupational History  . Not on file.   Social History Main Topics  . Smoking status: Never Smoker   . Smokeless tobacco: Never Used  . Alcohol Use: No  . Drug Use: No  . Sexually Active: Not Currently   Other Topics Concern  . Not on file   Social History Narrative  . No narrative on file   Family History  Problem Relation Age of Onset  . Hypertension Mother   . Stroke Mother   . Cancer Father   . Cancer Sister     leukemia  . Cancer Brother     leukemia  . Cancer Sister     vaginal  . Cancer Sister     bladder  . Cancer Brother     prostate, skin  . Cancer Brother     colon  . Cancer Brother     prostate, bone mets  . Cancer Brother     skin  . Heart disease Sister    Patient Active Problem List  Diagnoses  . Thyroid disease  . Hypertension  . Hyperlipidemia  . Arthritis  . Cancer  . Edema of both legs  . Breast cancer  . Gait disturbance  . Osteopenia  . Cholelithiasis with cholecystitis without obstruction  . Liver hemangioma  . Pulmonary nodule, right   Current Outpatient Prescriptions on File Prior to Visit  Medication Sig  Dispense Refill  . aspirin 81 MG tablet Take 81 mg by mouth daily.        . Calcium Carbonate-Vitamin D (CALCIUM + D) 600-200 MG-UNIT TABS Take 1 tablet by mouth 2 (two) times daily.        . chlorpheniramine-HYDROcodone (TUSSIONEX PENNKINETIC ER) 10-8 MG/5ML LQCR Take one teaspoon q12h  140 mL  0  . Coenzyme Q10 (CO Q 10) 100 MG CAPS Take 1 capsule by mouth daily.        . Cyanocobalamin (VITAMIN B-12) 5000 MCG SUBL Take 1 tablet by mouth daily.        Marland Kitchen exemestane (AROMASIN) 25 MG tablet Take 1 tablet (25 mg total) by mouth daily after breakfast.  90 tablet  4  . folic acid (FOLVITE) 1 MG tablet Take 1 mg by mouth daily.        Marland Kitchen levofloxacin (LEVAQUIN) 500 MG tablet Take 1 tablet (500 mg total) by mouth daily.  7 tablet  0  . levothyroxine (SYNTHROID) 125 MCG tablet Take 125  mcg by mouth daily.        Marland Kitchen LORazepam (ATIVAN) 1 MG tablet Take 1 mg by mouth at bedtime.        Marland Kitchen losartan-hydrochlorothiazide (HYZAAR) 100-25 MG per tablet Take 1 tablet by mouth daily.        . Magnesium 500 MG CAPS Take 1 capsule by mouth daily.        Carlene Coria Mushroom POWD Take 30 mg by mouth 2 (two) times daily.        . Multiple Vitamins-Minerals (CENTRUM SILVER PO) Take by mouth. CALTRATE       . Omega-3 Fatty Acids (FISH OIL) 1000 MG CAPS Take 1 capsule by mouth daily.        Marland Kitchen omeprazole (PRILOSEC) 20 MG capsule Take 1 capsule (20 mg total) by mouth daily.  30 capsule  1  . rosuvastatin (CRESTOR) 10 MG tablet Take 10 mg by mouth daily.            Review of Systems See above    Objective:   Physical Exam Alert smiling  Lipstick on  Pulse ox 98 Physical Exam  Nursing note and vitals reviewed.  Constitutional: She is oriented to person, place, and time. She appears well-developed and well-nourished.  HENT:  Head: Normocephalic and atraumatic.  Cardiovascular: Normal rate and regular rhythm. Exam reveals no gallop and no friction rub.  No murmur heard.  Pulmonary/Chest: Breath sounds normal. She has  no wheezes. She has no rales.  Neurological: She is alert and oriented to person, place, and time.  Skin: Skin is warm and dry.  Psychiatric: She has a normal mood and affect. Her behavior is normal.              Assessment & Plan:  1)  Bronchitis  willl give albuterol HHN and MDi to use 2 puffs qid  Rocephin 500 gm im once   CXR neg 2)  Cough  Continue tussionex

## 2012-03-25 ENCOUNTER — Other Ambulatory Visit: Payer: Self-pay | Admitting: Internal Medicine

## 2012-03-25 MED ORDER — LORAZEPAM 1 MG PO TABS: 1.0000 mg | ORAL_TABLET | Freq: Every day | ORAL | Status: DC

## 2012-03-25 NOTE — Telephone Encounter (Signed)
Hannah Galvan can you call in please

## 2012-03-25 NOTE — Telephone Encounter (Signed)
Pt needs refill of LORazepam (Tab) ATIVAN 1 MG Take 1 mg by mouth at bedtime.  She states she will be out in a couple of days. [facility] to  [facility] [address]  Thanks

## 2012-03-26 NOTE — Telephone Encounter (Signed)
RX for Ativan 1 mg #30 [facility] [address] [place], one po at HS and 1 RF per Dr Constance Goltz.

## 2012-05-04 ENCOUNTER — Other Ambulatory Visit: Payer: Self-pay | Admitting: Internal Medicine

## 2012-05-04 MED ORDER — LORAZEPAM 1 MG PO TABS: 1.0000 mg | ORAL_TABLET | Freq: Every day | ORAL | Status: DC

## 2012-05-04 NOTE — Telephone Encounter (Signed)
Pt requesting 60 day supply and also needs a CPE done before July 1st for insurance reasons.

## 2012-05-04 NOTE — Telephone Encounter (Signed)
LORazepam (Tab) ATIVAN 1 MG Take 1 tablet (1 mg total) by mouth at bedtime.  Needs a refill of this with a 60 day supply; she states she takes it twice a day,   Also would like to get a physical before July 1 or at least some blood work... Please call pt at [phone]... [facility] [address] IN [place]... THANKS

## 2012-05-17 ENCOUNTER — Telehealth: Payer: Self-pay | Admitting: Internal Medicine

## 2012-05-17 NOTE — Telephone Encounter (Signed)
Pt called to see if something can be called into the pharmacy. She is weak, head hurts, can't speak, etc. Please call her per Dr. Constance Goltz [phone]... Ad

## 2012-05-18 ENCOUNTER — Ambulatory Visit (INDEPENDENT_AMBULATORY_CARE_PROVIDER_SITE_OTHER): Payer: Medicare Other | Admitting: Internal Medicine

## 2012-05-18 ENCOUNTER — Encounter: Payer: Self-pay | Admitting: Internal Medicine

## 2012-05-18 VITALS — BP 126/75 | HR 80 | Temp 97.2°F | Resp 16 | Ht 61.5 in | Wt 130.0 lb

## 2012-05-18 DIAGNOSIS — J329 Chronic sinusitis, unspecified: Secondary | ICD-10-CM

## 2012-05-18 DIAGNOSIS — J029 Acute pharyngitis, unspecified: Secondary | ICD-10-CM

## 2012-05-18 MED ORDER — LEVOFLOXACIN 500 MG PO TABS: 500.0000 mg | ORAL_TABLET | Freq: Every day | ORAL | Status: AC

## 2012-05-18 NOTE — Progress Notes (Signed)
Subjective:    Patient ID: Hannah Galvan, female    DOB: Sep 28, 1931, 78 y.o.   MRN: [id number]  HPI Acute visit. Sore throat , laryngitis, and sinus pain  No fever.  No cough or chest pain  Allergies  Allergen Reactions  . Pyridium (Phenazopyridine Hcl) Other (See Comments)    hemolysis  . Sulfa Antibiotics Rash   Past Medical History  Diagnosis Date  . Thyroid disease   . Hypertension   . Hyperlipidemia   . Arthritis   . Cancer     breast, s/p RXT, bilateral   Past Surgical History  Procedure Date  . Abdominal hysterectomy 2008  . Thyroid surgery 2002  . Breast lumpectomy 2000  . Breast lumpectomy 2007  . Cataract extraction 2011  . Rotator cuff repair     right  . Knee surgery     right   History   Social History  . Marital Status: Married    Spouse Name: N/A    Number of Children: N/A  . Years of Education: N/A   Occupational History  . Not on file.   Social History Main Topics  . Smoking status: Never Smoker   . Smokeless tobacco: Never Used  . Alcohol Use: No  . Drug Use: No  . Sexually Active: Not Currently   Other Topics Concern  . Not on file   Social History Narrative  . No narrative on file   Family History  Problem Relation Age of Onset  . Hypertension Mother   . Stroke Mother   . Cancer Father   . Cancer Sister     leukemia  . Cancer Brother     leukemia  . Cancer Sister     vaginal  . Cancer Sister     bladder  . Cancer Brother     prostate, skin  . Cancer Brother     colon  . Cancer Brother     prostate, bone mets  . Cancer Brother     skin  . Heart disease Sister    Patient Active Problem List  Diagnosis  . Thyroid disease  . Hypertension  . Hyperlipidemia  . Arthritis  . Cancer  . Edema of both legs  . Breast cancer  . Gait disturbance  . Osteopenia  . Cholelithiasis with cholecystitis without obstruction  . Liver hemangioma  . Pulmonary nodule, right   Current Outpatient Prescriptions on File Prior to  Visit  Medication Sig Dispense Refill  . albuterol (PROVENTIL HFA;VENTOLIN HFA) 108 (90 BASE) MCG/ACT inhaler Inhale 2 puffs into the lungs every 6 (six) hours as needed for wheezing (instruct in use).  1 Inhaler  2  . aspirin 81 MG tablet Take 81 mg by mouth daily.        . Calcium Carbonate-Vitamin D (CALCIUM + D) 600-200 MG-UNIT TABS Take 1 tablet by mouth 2 (two) times daily.        . Cyanocobalamin (VITAMIN B-12) 5000 MCG SUBL Take 1 tablet by mouth daily.        Marland Kitchen exemestane (AROMASIN) 25 MG tablet Take 1 tablet (25 mg total) by mouth daily after breakfast.  90 tablet  4  . folic acid (FOLVITE) 1 MG tablet Take 1 mg by mouth daily.        Marland Kitchen levothyroxine (SYNTHROID) 125 MCG tablet Take 125 mcg by mouth daily.        Marland Kitchen LORazepam (ATIVAN) 1 MG tablet Take 1 tablet (1 mg total) by mouth  at bedtime.  30 tablet  1  . losartan-hydrochlorothiazide (HYZAAR) 100-25 MG per tablet Take 1 tablet by mouth daily.        . Magnesium 500 MG CAPS Take 1 capsule by mouth daily.        . Multiple Vitamins-Minerals (CENTRUM SILVER PO) Take by mouth. CALTRATE       . Omega-3 Fatty Acids (FISH OIL) 1000 MG CAPS Take 1 capsule by mouth daily.        Marland Kitchen omeprazole (PRILOSEC) 20 MG capsule Take 1 capsule (20 mg total) by mouth daily.  30 capsule  1  . [place] Mushroom POWD Take 30 mg by mouth 2 (two) times daily.        . rosuvastatin (CRESTOR) 10 MG tablet Take 10 mg by mouth daily.             Review of Systems See HPI    Objective:   Physical Exam  Physical Exam  Constitutional: She is oriented to person, place, and time. She appears well-developed and well-nourished. She is cooperative.  HENT:  Head: Normocephalic and atraumatic.  Right Ear: A middle ear effusion is present.  Left Ear: A middle ear effusion is present.  Nose: Mucosal edema present. Right sinus exhibits maxillary sinus tenderness. Left sinus exhibits maxillary sinus tenderness.  Mouth/Throat: Posterior oropharyngeal erythema  present.  Serous effusion bilaterally  Eyes: Conjunctivae and EOM are normal. Pupils are equal, round, and reactive to light.  Neck: Neck supple. Carotid bruit is not present. No mass present.  Cardiovascular: Regular rhythm, normal heart sounds, intact distal pulses and normal pulses. Exam reveals no gallop and no friction rub.  No murmur heard.  Pulmonary/Chest: Breath sounds normal. She has no wheezes. She has no rhonchi. She has no rales.  Neurological: She is alert and oriented to person, place, and time.  Skin: Skin is warm and dry. No abrasion, no bruising, no ecchymosis and no rash noted. No cyanosis. Nails show no clubbing.  Psychiatric: She has a normal mood and affect. Her speech is normal and behavior is normal.           Assessment & Plan:  Sinusitis  Will give Levaquin 500 mg for 7 days  Pharyngitis  See above

## 2012-05-18 NOTE — Patient Instructions (Signed)
Call if not better 

## 2012-06-02 ENCOUNTER — Encounter: Payer: Self-pay | Admitting: Internal Medicine

## 2012-06-02 ENCOUNTER — Ambulatory Visit (INDEPENDENT_AMBULATORY_CARE_PROVIDER_SITE_OTHER): Payer: Medicare Other | Admitting: Internal Medicine

## 2012-06-02 VITALS — BP 130/70 | HR 75 | Temp 97.4°F | Resp 16 | Ht 61.5 in | Wt 130.0 lb

## 2012-06-02 DIAGNOSIS — R911 Solitary pulmonary nodule: Secondary | ICD-10-CM

## 2012-06-02 DIAGNOSIS — I10 Essential (primary) hypertension: Secondary | ICD-10-CM

## 2012-06-02 DIAGNOSIS — E785 Hyperlipidemia, unspecified: Secondary | ICD-10-CM

## 2012-06-02 DIAGNOSIS — Z01419 Encounter for gynecological examination (general) (routine) without abnormal findings: Secondary | ICD-10-CM

## 2012-06-02 DIAGNOSIS — H6120 Impacted cerumen, unspecified ear: Secondary | ICD-10-CM

## 2012-06-02 DIAGNOSIS — Z23 Encounter for immunization: Secondary | ICD-10-CM

## 2012-06-02 DIAGNOSIS — Z0000 Encounter for general adult medical examination without abnormal findings: Secondary | ICD-10-CM

## 2012-06-02 LAB — POCT URINALYSIS DIPSTICK
Blood, UA: NEGATIVE
Glucose, UA: NEGATIVE
Nitrite, UA: NEGATIVE
Urobilinogen, UA: NEGATIVE
pH, UA: 7.5

## 2012-06-02 LAB — TSH: TSH: 0.518 u[IU]/mL (ref 0.350–4.500)

## 2012-06-02 LAB — CBC WITH DIFFERENTIAL/PLATELET
Basophils Relative: 0 % (ref 0–1)
Eosinophils Absolute: 0.2 10*3/uL (ref 0.0–0.7)
MCH: 31.6 pg (ref 26.0–34.0)
MCHC: 34.3 g/dL (ref 30.0–36.0)
Neutrophils Relative %: 52 % (ref 43–77)
Platelets: 275 10*3/uL (ref 150–400)
RBC: 4.18 MIL/uL (ref 3.87–5.11)

## 2012-06-02 LAB — LIPID PANEL
LDL Cholesterol: 162 mg/dL — ABNORMAL HIGH (ref 0–99)
Triglycerides: 131 mg/dL (ref <150)
VLDL: 26 mg/dL (ref 0–40)

## 2012-06-02 LAB — COMPREHENSIVE METABOLIC PANEL
ALT: 16 U/L (ref 0–35)
Alkaline Phosphatase: 43 U/L (ref 39–117)
Sodium: 136 mEq/L (ref 135–145)
Total Bilirubin: 0.5 mg/dL (ref 0.3–1.2)
Total Protein: 7 g/dL (ref 6.0–8.3)

## 2012-06-02 MED ORDER — PNEUMOCOCCAL VAC POLYVALENT 25 MCG/0.5ML IJ INJ: 0.5000 mL | INJECTION | Freq: Once | INTRAMUSCULAR | Status: DC

## 2012-06-02 NOTE — Progress Notes (Signed)
Subjective:    Patient ID: Hannah Galvan, female    DOB: 1931-07-08, 78 y.o.   MRN: [id number]  HPI Hannah Galvan is here for comprehensive eval.  Has decreased hearing R ear and does not like to chronic edema in both legs.    She denies any dizziness or chest pain  Allergies  Allergen Reactions  . Pyridium (Phenazopyridine Hcl) Other (See Comments)    hemolysis  . Sulfa Antibiotics Rash   Past Medical History  Diagnosis Date  . Thyroid disease   . Hypertension   . Hyperlipidemia   . Arthritis   . Cancer     breast, s/p RXT, bilateral   Past Surgical History  Procedure Date  . Abdominal hysterectomy 2008  . Thyroid surgery 2002  . Breast lumpectomy 2000  . Breast lumpectomy 2007  . Cataract extraction 2011  . Rotator cuff repair     right  . Knee surgery     right   History   Social History  . Marital Status: Married    Spouse Name: N/A    Number of Children: N/A  . Years of Education: N/A   Occupational History  . Not on file.   Social History Main Topics  . Smoking status: Never Smoker   . Smokeless tobacco: Never Used  . Alcohol Use: No  . Drug Use: No  . Sexually Active: Not Currently   Other Topics Concern  . Not on file   Social History Narrative  . No narrative on file   Family History  Problem Relation Age of Onset  . Hypertension Mother   . Stroke Mother   . Cancer Father   . Cancer Sister     leukemia  . Cancer Brother     leukemia  . Cancer Sister     vaginal  . Cancer Sister     bladder  . Cancer Brother     prostate, skin  . Cancer Brother     colon  . Cancer Brother     prostate, bone mets  . Cancer Brother     skin  . Heart disease Sister    Patient Active Problem List  Diagnosis  . Thyroid disease  . Hypertension  . Hyperlipidemia  . Arthritis  . Cancer  . Edema of both legs  . Breast cancer  . Gait disturbance  . Osteopenia  . Cholelithiasis with cholecystitis without obstruction  . Liver hemangioma  . Pulmonary  nodule, right   Current Outpatient Prescriptions on File Prior to Visit  Medication Sig Dispense Refill  . aspirin 81 MG tablet Take 81 mg by mouth daily.        . Calcium Carbonate-Vitamin D (CALCIUM + D) 600-200 MG-UNIT TABS Take 1 tablet by mouth 2 (two) times daily.        Marland Kitchen exemestane (AROMASIN) 25 MG tablet Take 1 tablet (25 mg total) by mouth daily after breakfast.  90 tablet  4  . folic acid (FOLVITE) 1 MG tablet Take 1 mg by mouth daily.        Marland Kitchen levothyroxine (SYNTHROID) 125 MCG tablet Take 125 mcg by mouth daily.        Marland Kitchen LORazepam (ATIVAN) 1 MG tablet Take 1 tablet (1 mg total) by mouth at bedtime.  30 tablet  1  . losartan-hydrochlorothiazide (HYZAAR) 100-25 MG per tablet Take 1 tablet by mouth daily.        . Magnesium 500 MG CAPS Take 1 capsule by mouth daily.        Marland Kitchen  Multiple Vitamins-Minerals (CENTRUM SILVER PO) Take by mouth. CALTRATE       . Omega-3 Fatty Acids (FISH OIL) 1000 MG CAPS Take 1 capsule by mouth daily.        . Cyanocobalamin (VITAMIN B-12) 5000 MCG SUBL Take 1 tablet by mouth daily.         No current facility-administered medications on file prior to visit.       Review of Systems     Objective:   Physical Exam  Physical Exam  Nursing note and vitals reviewed.  Constitutional: She is oriented to person, place, and time. She appears well-developed and well-nourished.  HENT:  Head: Normocephalic and atraumatic.  Right Ear:  Cerumen impaction  Left Ear: Tympanic membrane and ear canal normal. No drainage. Tympanic membrane is not injected and not erythematous.  Nose: Nose normal. Right sinus exhibits no maxillary sinus tenderness and no frontal sinus tenderness. Left sinus exhibits no maxillary sinus tenderness and no frontal sinus tenderness.  Mouth/Throat: Oropharynx is clear and moist. No oral lesions. No oropharyngeal exudate.  Eyes: Conjunctivae and EOM are normal. Pupils are equal, round, and reactive to light.  Neck: Normal range of motion.  Neck supple. No JVD present. Carotid bruit is not present. No mass and no thyromegaly present.  Cardiovascular: Normal rate, regular rhythm, S1 normal, S2 normal and intact distal pulses. Exam reveals no gallop and no friction rub.  No murmur heard.  Pulses:  Carotid pulses are 2+ on the right side, and 2+ on the left side.  Dorsalis pedis pulses are 2+ on the right side, and 2+ on the left side.  No carotid bruit. No LE edema  Pulmonary/Chest: Breath sounds normal. She has no wheezes. She has no rales. She exhibits no tenderness.  Breast no discrerte masses no nipple discharge no axillary adenoapthy bilaterally Abdominal: Soft. Bowel sounds are normal. She exhibits no distension and no mass. There is no hepatosplenomegaly. There is no tenderness. There is no CVA tenderness.  Rectal no mass guaiac neg Musculoskeletal: Normal range of motion.  No active synovitis to joints.  Lymphadenopathy:  She has no cervical adenopathy.  She has no axillary adenopathy.  Right: No inguinal and no supraclavicular adenopathy present.  Left: No inguinal and no supraclavicular adenopathy present.  Neurological: She is alert and oriented to person, place, and time. She has normal strength and normal reflexes. She displays no tremor. No cranial nerve deficit or sensory deficit. Coordination and gait normal.  Skin: Skin is warm and dry. No rash noted. No cyanosis. Nails show no clubbing.  Psychiatric: She has a normal mood and affect. Her speech is normal and behavior is normal. Cognition and memory are normal.  Ext  She has trace Le edema         Assessment & Plan:  HTN well controlled  Hyperlipdemia  Will check today  She is on fish oil.  Hypothyroidism  R cerumen impaction Disimapction by water removal today.  Successful with no complications  Le edema  Chronic  Breast cancer Dr. Myna Hidalgo  I spent 45 minutes with this pt

## 2012-06-03 ENCOUNTER — Telehealth: Payer: Self-pay | Admitting: *Deleted

## 2012-06-03 ENCOUNTER — Encounter: Payer: Self-pay | Admitting: Internal Medicine

## 2012-06-03 LAB — HEMOGLOBIN A1C: Mean Plasma Glucose: 108 mg/dL (ref <117)

## 2012-06-03 NOTE — Telephone Encounter (Signed)
Copy of labs mailed to pt's home address. 

## 2012-06-14 ENCOUNTER — Other Ambulatory Visit (HOSPITAL_BASED_OUTPATIENT_CLINIC_OR_DEPARTMENT_OTHER): Payer: Medicare Other | Admitting: Lab

## 2012-06-14 ENCOUNTER — Ambulatory Visit (HOSPITAL_BASED_OUTPATIENT_CLINIC_OR_DEPARTMENT_OTHER): Payer: Medicare Other | Admitting: Hematology & Oncology

## 2012-06-14 DIAGNOSIS — D0590 Unspecified type of carcinoma in situ of unspecified breast: Secondary | ICD-10-CM

## 2012-06-14 DIAGNOSIS — M810 Age-related osteoporosis without current pathological fracture: Secondary | ICD-10-CM

## 2012-06-14 DIAGNOSIS — C50919 Malignant neoplasm of unspecified site of unspecified female breast: Secondary | ICD-10-CM

## 2012-06-14 DIAGNOSIS — Z853 Personal history of malignant neoplasm of breast: Secondary | ICD-10-CM

## 2012-06-14 DIAGNOSIS — Z9889 Other specified postprocedural states: Secondary | ICD-10-CM

## 2012-06-14 LAB — CBC WITH DIFFERENTIAL (CANCER CENTER ONLY)
BASO#: 0 10*3/uL (ref 0.0–0.2)
EOS%: 1.5 % (ref 0.0–7.0)
HGB: 13.8 g/dL (ref 11.6–15.9)
LYMPH%: 27.4 % (ref 14.0–48.0)
MCH: 32.4 pg (ref 26.0–34.0)
MCHC: 34.6 g/dL (ref 32.0–36.0)
MONO%: 9.5 % (ref 0.0–13.0)
NEUT#: 5 10*3/uL (ref 1.5–6.5)

## 2012-06-14 NOTE — Progress Notes (Signed)
This office note has been dictated.

## 2012-06-14 NOTE — Progress Notes (Signed)
CC:   Kendrick Ranch, M.D. Nadara Mustard, MD  DIAGNOSES: 1. 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 2. History of a stage IIA ductal carcinoma of the right breast (T1 N1     M0). 3. Drug induced hemolytic anemia-remission.  CURRENT THERAPY:  Aromasin 25 mg p.o. daily.  INTERIM HISTORY:  Ms. Robarts comes in for followup.  She is doing okay although she is having a lot of problems with her right knee.  She sees Dr. Lajoyce Corners for this.  She says she had knee surgery 6 months ago.  She says the knee is no better.  She may need to have a knee replacement.  She has noticed some swelling in the right leg.  This is mostly around the right knee.  There has been no warmth or redness that she has noticed.  She feels tired.  She sees Dr. Constance Goltz for her general medical issues.  She does have hypothyroidism.  She is on Synthroid at 0.125 mg dose.  There has been no change in bowel or bladder habits.  There is some palpitation that she feels.  She does get short of breath on occasion.  She has had no fever.  She has not noticed any rashes.  PHYSICAL EXAMINATION:  General:  This is an elderly but well-nourished white female in no obvious distress.  Vital signs:  Showed temperature of 97.1, pulse 78, respiratory rate 18, blood pressure 120/71.  Weight is 131.  Head and neck:  Showed a normocephalic, atraumatic skull. There are no ocular or oral lesions.  There are no palpable cervical or supraclavicular lymph nodes.  Lungs:  Clear to percussion and auscultation bilaterally.  Cardiac:  Regular rate and rhythm with a normal S1 and S2.  There are no murmurs, rubs or bruits.  Abdomen:  Soft with good bowel sounds.  There was no palpable abdominal mass.  There was no palpable hepatosplenomegaly.  Breasts:  Shows left breast with well-healed lumpectomy at about the 4 o'clock position.  She has some slight contraction of the left breast.  There was no distinct mass in the left  breast.  There was no left axillary adenopathy.  Right breast shows a well-healed lumpectomy at about the 9 o'clock position.  She has no distinct mass in the right breast.  There was no right axillary adenopathy.  Back:  Shows slight kyphosis.  She has no tenderness over the spine, ribs or hips.  Extremities:  Shows no lymphedema in the arms or legs.  She may have some slight swelling of the right leg and knee. There was no erythema of the right knee.  LABORATORY STUDIES:  Show a white cell count of 8.2, hemoglobin 13.8, hematocrit 39.9, platelet count 249.  MCV is 94.  IMPRESSION:  Ms. Mobley is a 78 year old white female with a history of 2 separate breast malignancies.  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  She previously had had stage IIA carcinoma of the right breast back in I think 2000.  She is doing okay from my point of view.  She will follow up with [place] in another 6 months.  Her last chest x-ray was done back in March.  Everything was okay on the chest x-ray without any noted nodules.    ______________________________ Josph Macho, M.D. PRE/MEDQ  D:  06/14/2012  T:  06/14/2012  Job:  [id number]

## 2012-06-15 LAB — COMPREHENSIVE METABOLIC PANEL
AST: 24 U/L (ref 0–37)
Albumin: 4.6 g/dL (ref 3.5–5.2)
Alkaline Phosphatase: 47 U/L (ref 39–117)
BUN: 8 mg/dL (ref 6–23)
Creatinine, Ser: 0.78 mg/dL (ref 0.50–1.10)
Potassium: 3.6 mEq/L (ref 3.5–5.3)
Total Bilirubin: 0.3 mg/dL (ref 0.3–1.2)

## 2012-06-21 ENCOUNTER — Telehealth: Payer: Self-pay | Admitting: *Deleted

## 2012-06-21 NOTE — Telephone Encounter (Signed)
Pt called on Friday requesting a rx for Antivert for "dizziness; feels like my head is spinning; think it is an inner ear thing". She stated Dr Constance Goltz is not in the office today and was wondering if Dr Myna Hidalgo would call it in for her. Asked when the last time she had this problem and she said "about 20 years ago". Reviewed with Dr. Myna Hidalgo. Pt needs to be evaluated in an Urgent Care or ED as it can't be assumed it is an inner ear problem. She verbalized understanding and stated "I will wait until tomorrow and if it's not better by them, then I will go". Explained that it could be related to many other things other than inner ear as it was noted she was on blood pressure medications. She said she checked her blood pressure earlier and it was fine. Still wants to wait.

## 2012-06-24 ENCOUNTER — Telehealth: Payer: Self-pay | Admitting: *Deleted

## 2012-06-24 NOTE — Telephone Encounter (Signed)
Called patient to let her know that her labwork was ok per dr. Myna Hidalgo.  She can cut down on Vitamin D to 1000 u daily per dr. Myna Hidalgo. Patient understands instructions

## 2012-06-24 NOTE — Telephone Encounter (Signed)
Message copied by Anselm Jungling on Thu Jun 24, 2012  9:04 AM ------      Message from: Arlan Organ R      Created: Mon Jun 21, 2012  3:37 PM       Call - labs are ok.  Cut down on Vit D to 1000units a day.  Hewitt Shorts

## 2012-07-13 ENCOUNTER — Telehealth: Payer: Self-pay | Admitting: Cardiology

## 2012-07-13 NOTE — Telephone Encounter (Signed)
I know her.  Okay to take her again

## 2012-07-13 NOTE — Telephone Encounter (Signed)
This is the wife of Max Milbrath, will forward to  Dr. Patty Sermons

## 2012-07-13 NOTE — Telephone Encounter (Signed)
Pt has seen dr Patty Sermons about 10 years ago, wants to see him again for fast heart beat, dizziness and sob, was told when called last week that he wasn't taking new pt's , and she wants melinda to ask him to see her anyway, pls call

## 2012-07-13 NOTE — Telephone Encounter (Signed)
Scheduled appointment for Thursday, advised patient

## 2012-07-15 ENCOUNTER — Ambulatory Visit (INDEPENDENT_AMBULATORY_CARE_PROVIDER_SITE_OTHER): Payer: Medicare Other | Admitting: Cardiology

## 2012-07-15 ENCOUNTER — Encounter: Payer: Self-pay | Admitting: Cardiology

## 2012-07-15 VITALS — BP 120/68 | HR 79 | Ht 63.0 in | Wt 132.0 lb

## 2012-07-15 DIAGNOSIS — R42 Dizziness and giddiness: Secondary | ICD-10-CM

## 2012-07-15 DIAGNOSIS — I358 Other nonrheumatic aortic valve disorders: Secondary | ICD-10-CM | POA: Insufficient documentation

## 2012-07-15 DIAGNOSIS — I359 Nonrheumatic aortic valve disorder, unspecified: Secondary | ICD-10-CM

## 2012-07-15 DIAGNOSIS — I499 Cardiac arrhythmia, unspecified: Secondary | ICD-10-CM

## 2012-07-15 DIAGNOSIS — I10 Essential (primary) hypertension: Secondary | ICD-10-CM

## 2012-07-15 DIAGNOSIS — E7800 Pure hypercholesterolemia, unspecified: Secondary | ICD-10-CM

## 2012-07-15 DIAGNOSIS — E785 Hyperlipidemia, unspecified: Secondary | ICD-10-CM

## 2012-07-15 NOTE — Patient Instructions (Addendum)
Your physician has requested that you have an echocardiogram. Echocardiography is a painless test that uses sound waves to create images of your heart. It provides your doctor with information about the size and shape of your heart and how well your heart's chambers and valves are working. This procedure takes approximately one hour. There are no restrictions for this procedure.  Will have you schedule CT scan of your head soon  Your physician recommends that you schedule a follow-up appointment in: 3 months with fasting labs (LP/BMET/HFP)   Start back on your Crestor 10 mg 1/2 tablet daily.  If you do not have any at home call and we will send some to your pharmacy

## 2012-07-15 NOTE — Assessment & Plan Note (Signed)
Her recent LDL when not taking Crestor was 162.  She did not tolerate 10 mg Crestor because of aching in her legs.  We will restart at a dose of 5 mg daily and plan to check labs in 3 months

## 2012-07-15 NOTE — Assessment & Plan Note (Signed)
The patient has a systolic ejection murmur across the aortic area.  We don't have any echocardiograms to review.  We will obtain a two-dimensional echocardiogram to be sure that her aortic valve disease is not contributing to her dizziness and fatigue and shortness of breath

## 2012-07-15 NOTE — Progress Notes (Signed)
Hannah Galvan Date of Birth:  1931-08-03 [facility] [address] [place], [place]  [address] [phone]         Fax   [phone]  History of Present Illness: This pleasant 78 year old woman is seen after a long absence.  She comes in complaining of shortness of breath and fatigue.  She also complains that her head feels funny.  She notes occasional palpitations and racing of her heart.  She has had several mild dizzy spells but no syncope.  She has been in good general health.  She has had cancer of the right breast in 2000 and cancer of the left breast in 2007 and she is on Aromasin long-term.  She does have mild hot flashes.  She does have a past history of elevated cholesterol but had stopped taking her Crestor because of some aching in her legs.  Her most recent LDL was significantly elevated and she will restart her Crestor in a lower dose of 5 mg daily.  She has a history of a heart murmur.  She has not had a recent echo.  She did have a chest x-ray 02/12/12 which was satisfactory.  Current Outpatient Prescriptions  Medication Sig Dispense Refill  . aspirin 81 MG tablet Take 81 mg by mouth daily.        . Calcium Carbonate-Vitamin D (CALCIUM + D) 600-200 MG-UNIT TABS Take 1 tablet by mouth 2 (two) times daily.        . Cyanocobalamin (VITAMIN B-12) 5000 MCG SUBL Take 1 tablet by mouth daily.        Marland Kitchen exemestane (AROMASIN) 25 MG tablet Take 1 tablet (25 mg total) by mouth daily after breakfast.  90 tablet  4  . folic acid (FOLVITE) 1 MG tablet Take 1 mg by mouth daily.        Marland Kitchen levothyroxine (SYNTHROID) 125 MCG tablet Take 125 mcg by mouth daily.        Marland Kitchen LORazepam (ATIVAN) 1 MG tablet Take 1 tablet (1 mg total) by mouth at bedtime.  30 tablet  1  . losartan-hydrochlorothiazide (HYZAAR) 100-25 MG per tablet Take 1 tablet by mouth daily.        . Magnesium 500 MG CAPS Take 1 capsule by mouth daily.        . Multiple Vitamins-Minerals (CENTRUM SILVER PO) Take by  mouth. CALTRATE       . Omega-3 Fatty Acids (FISH OIL) 1000 MG CAPS Take 1 capsule by mouth daily.        . rosuvastatin (CRESTOR) 10 MG tablet Take 10 mg by mouth as directed. 1/2 tablet daily       Current Facility-Administered Medications  Medication Dose Route Frequency Provider Last Rate Last Dose  . pneumococcal 23 valent vaccine (PNU-IMMUNE) injection 0.5 mL  0.5 mL Intramuscular Once Kendrick Ranch, MD        Allergies  Allergen Reactions  . Pyridium (Phenazopyridine Hcl) Other (See Comments)    hemolysis  . Sulfa Antibiotics Rash    Patient Active Problem List  Diagnosis  . Thyroid disease  . Hypertension  . Hyperlipidemia  . Arthritis  . Cancer  . Edema of both legs  . Breast cancer  . Gait disturbance  . Osteopenia  . Cholelithiasis with cholecystitis without obstruction  . Liver hemangioma  . Pulmonary nodule, right  . Heart murmur, aortic  . Dizziness - giddy    History  Smoking status  . Never Smoker  Smokeless tobacco  . Never Used    History  Alcohol Use No    Family History  Problem Relation Age of Onset  . Hypertension Mother   . Stroke Mother   . Cancer Father   . Cancer Sister     leukemia  . Cancer Brother     leukemia  . Cancer Sister     vaginal  . Cancer Sister     bladder  . Cancer Brother     prostate, skin  . Cancer Brother     colon  . Cancer Brother     prostate, bone mets  . Cancer Brother     skin  . Heart disease Sister     Review of Systems: Constitutional: no fever chills diaphoresis or fatigue or change in weight.  Head and neck: no hearing loss, no epistaxis, no photophobia or visual disturbance. Respiratory: No cough, shortness of breath or wheezing. Cardiovascular: No chest pain peripheral edema, palpitations. Gastrointestinal: No abdominal distention, no abdominal pain, no change in bowel habits hematochezia or melena. Genitourinary: No dysuria, no frequency, no urgency, no  nocturia. Musculoskeletal:No arthralgias, no back pain, no gait disturbance or myalgias. Neurological: No dizziness, no headaches, no numbness, no seizures, no syncope, no weakness, no tremors. Hematologic: No lymphadenopathy, no easy bruising. Psychiatric: No confusion, no hallucinations, no sleep disturbance.    Physical Exam: Filed Vitals:   07/15/12 1323  BP: 120/68  Pulse: 79   the general appearance reveals an elderly woman in no acute distress.The head and neck exam reveals pupils equal and reactive.  Extraocular movements are full.  There is no scleral icterus.  The mouth and pharynx are normal.  The neck is supple.  The carotids reveal no bruits.  The jugular venous pressure is normal.  The  thyroid is not enlarged.  There is no lymphadenopathy.  The chest is clear to percussion and auscultation.  There are no rales or rhonchi.  Expansion of the chest is symmetrical.  The precordium is quiet.  The first heart sound is normal.  The second heart sound is physiologically split.  There is no  gallop rub or click.  There is a grade 2/6 systolic ejection murmur at the base  There is no abnormal lift or heave.  The abdomen is soft and nontender.  The bowel sounds are normal.  The liver and spleen are not enlarged.  There are no abdominal masses.  There are no abdominal bruits.  Extremities reveal good pedal pulses.  There is no phlebitis and there is trace edema bilaterally.  There is no cyanosis or clubbing.  Strength is normal and symmetrical in all extremities.  There is no lateralizing weakness.  There are no sensory deficits.  The skin is warm and dry.  There is no rash.   EKG today shows normal sinus rhythm and is within normal limits.  Chest x-ray from 02/12/12 was reviewed and showed no cardiomegaly or CHF  Assessment / Plan: The cause of her shortness of breath is not readily apparent.  She has had a full battery of lab work recently which did not show any evidence of anemia or  electrolyte imbalance etc.  We will obtain a two-dimensional echocardiogram.  We'll restart her Crestor for her dyslipidemia.  We will obtain a CT brain scan without contrast to evaluate her complaints of headache and dizzy spells further.  She'll be rechecked in 3 months for followup office visit lipid panel hepatic function panel and basal metabolic panel.

## 2012-07-15 NOTE — Assessment & Plan Note (Signed)
She has a past history of hypertension.  Her blood pressure has been maintaining steady on current dose of Hyzaar

## 2012-07-16 ENCOUNTER — Telehealth: Payer: Self-pay | Admitting: *Deleted

## 2012-07-16 ENCOUNTER — Other Ambulatory Visit: Payer: Self-pay | Admitting: *Deleted

## 2012-07-16 DIAGNOSIS — R42 Dizziness and giddiness: Secondary | ICD-10-CM

## 2012-07-16 NOTE — Telephone Encounter (Signed)
Spoke with Ms. Hannah Galvan today. Patient was informed that her CT of the head was not approved by the insurance company. Per Dr. Patty Sermons , reschedule CT to MRI of Brain w/wo contrast. Appointment is scheduled for 07/21/12 at 12 noon at [facility]. Patient agreed.

## 2012-07-21 ENCOUNTER — Ambulatory Visit (HOSPITAL_COMMUNITY)
Admission: RE | Admit: 2012-07-21 | Discharge: 2012-07-21 | Disposition: A | Discharge status: Home / Self Care | Payer: Medicare Other | Source: Ambulatory Visit | Attending: Cardiology | Admitting: Cardiology

## 2012-07-21 ENCOUNTER — Other Ambulatory Visit: Payer: Self-pay | Admitting: Cardiology

## 2012-07-21 ENCOUNTER — Ambulatory Visit (HOSPITAL_COMMUNITY): Payer: Medicare Other | Attending: Cardiology | Admitting: Radiology

## 2012-07-21 ENCOUNTER — Other Ambulatory Visit: Payer: Medicare Other

## 2012-07-21 DIAGNOSIS — R51 Headache: Secondary | ICD-10-CM | POA: Insufficient documentation

## 2012-07-21 DIAGNOSIS — R42 Dizziness and giddiness: Secondary | ICD-10-CM

## 2012-07-21 DIAGNOSIS — I10 Essential (primary) hypertension: Secondary | ICD-10-CM | POA: Insufficient documentation

## 2012-07-21 DIAGNOSIS — I079 Rheumatic tricuspid valve disease, unspecified: Secondary | ICD-10-CM | POA: Insufficient documentation

## 2012-07-21 DIAGNOSIS — G319 Degenerative disease of nervous system, unspecified: Secondary | ICD-10-CM | POA: Insufficient documentation

## 2012-07-21 DIAGNOSIS — I358 Other nonrheumatic aortic valve disorders: Secondary | ICD-10-CM

## 2012-07-21 DIAGNOSIS — I379 Nonrheumatic pulmonary valve disorder, unspecified: Secondary | ICD-10-CM | POA: Insufficient documentation

## 2012-07-21 DIAGNOSIS — R011 Cardiac murmur, unspecified: Secondary | ICD-10-CM | POA: Insufficient documentation

## 2012-07-21 DIAGNOSIS — Z853 Personal history of malignant neoplasm of breast: Secondary | ICD-10-CM | POA: Insufficient documentation

## 2012-07-21 DIAGNOSIS — I080 Rheumatic disorders of both mitral and aortic valves: Secondary | ICD-10-CM | POA: Insufficient documentation

## 2012-07-21 NOTE — Progress Notes (Signed)
Echocardiogram performed.  

## 2012-07-22 ENCOUNTER — Other Ambulatory Visit: Payer: Self-pay | Admitting: Internal Medicine

## 2012-07-22 ENCOUNTER — Telehealth: Payer: Self-pay | Admitting: *Deleted

## 2012-07-22 DIAGNOSIS — R002 Palpitations: Secondary | ICD-10-CM

## 2012-07-22 MED ORDER — FOLIC ACID 1 MG PO TABS: 1.0000 mg | ORAL_TABLET | Freq: Every day | ORAL | Status: DC

## 2012-07-22 MED ORDER — LEVOTHYROXINE SODIUM 125 MCG PO TABS: 125.0000 ug | ORAL_TABLET | Freq: Every day | ORAL | Status: DC

## 2012-07-22 NOTE — Telephone Encounter (Signed)
Arrange for an event monitor to evaluate rhythm

## 2012-07-22 NOTE — Telephone Encounter (Signed)
Pt states she needs to have her refill as soon as possible (per pt she is running out)... Folic Acid (Tab) FOLVITE 1 MG Take 1 mg by mouth daily.  Levothyroxine Sodium (Tab) SYNTHROID, LEVOTHROID 125 MCG Take 125 mcg by mouth daily.  Pt pharmacy with [facility]... If there are any questions please call [phone] and [phone]... Thanks

## 2012-07-22 NOTE — Telephone Encounter (Signed)
Message copied by Burnell Blanks on Thu Jul 22, 2012  5:14 PM ------      Message from: Cassell Clement      Created: Wed Jul 21, 2012  9:25 PM       Echo was good.  LV systolic function was good. Mild mitral regurgitation.Marland Kitchen CSD

## 2012-07-22 NOTE — Telephone Encounter (Signed)
Message copied by Burnell Blanks on Thu Jul 22, 2012  5:15 PM ------      Message from: Cassell Clement      Created: Wed Jul 21, 2012  9:24 PM       The MRI just showed age related changes.  No tumor or mass.

## 2012-07-22 NOTE — Telephone Encounter (Signed)
Advised of results   Concerned about her heart racing or beating fast at times.  This is a daily occurrence.  Will forward to  Dr. Patty Sermons for recommendations.

## 2012-07-27 ENCOUNTER — Other Ambulatory Visit: Payer: Self-pay | Admitting: Internal Medicine

## 2012-07-27 MED ORDER — FOLIC ACID 1 MG PO TABS: 1.0000 mg | ORAL_TABLET | Freq: Every day | ORAL | Status: DC

## 2012-07-27 MED ORDER — LEVOTHYROXINE SODIUM 125 MCG PO TABS: 125.0000 ug | ORAL_TABLET | Freq: Every day | ORAL | Status: DC

## 2012-07-27 NOTE — Telephone Encounter (Signed)
Pt needs refills sent to [facility] on the following meds... Pt states she is out of her Folic Acid and almost of her Synthroid... Folic Acid (Tab) FOLVITE 1 MG Take 1 tablet (1 mg total) by mouth daily. Levothyroxine Sodium (Tab) SYNTHROID, LEVOTHROID 125 MCG Take 1 tablet (125 mcg total) by mouth daily.

## 2012-07-28 ENCOUNTER — Telehealth: Payer: Self-pay | Admitting: Internal Medicine

## 2012-07-28 NOTE — Telephone Encounter (Signed)
Pt needs refills sent to [facility] Folic Acid (Tab) FOLVITE 1 MG Take 1 tablet (1 mg total) by mouth daily. She is out of her Folic Acid..  Levothyroxine Sodium (Tab) SYNTHROID, LEVOTHROID 125 MCG Take 1 tablet (125 mcg total) by mouth daily.

## 2012-07-30 NOTE — Telephone Encounter (Signed)
Left message to call back  

## 2012-08-04 ENCOUNTER — Other Ambulatory Visit: Payer: Self-pay | Admitting: Internal Medicine

## 2012-08-04 NOTE — Telephone Encounter (Signed)
[facility] is checking on the status of LORazepam (Tab) ATIVAN 1 MG Take 1 tablet (1 mg total) by mouth at bedtime. ... There number is [phone]... Thanks

## 2012-08-05 ENCOUNTER — Other Ambulatory Visit: Payer: Self-pay | Admitting: *Deleted

## 2012-08-05 MED ORDER — LORAZEPAM 1 MG PO TABS: 1.0000 mg | ORAL_TABLET | Freq: Every day | ORAL | Status: DC

## 2012-08-05 NOTE — Telephone Encounter (Signed)
Bobbie call this in

## 2012-08-05 NOTE — Telephone Encounter (Signed)
Called in RX for ativan

## 2012-08-09 ENCOUNTER — Telehealth: Payer: Self-pay | Admitting: Internal Medicine

## 2012-08-09 MED ORDER — LEVOTHYROXINE SODIUM 125 MCG PO TABS: 125.0000 ug | ORAL_TABLET | Freq: Every day | ORAL | Status: DC

## 2012-08-09 NOTE — Telephone Encounter (Signed)
Pt states her Levothyroxine Sodium (Tab) SYNTHROID, LEVOTHROID 125 MCG Take 1 tablet (125 mcg total) by mouth daily. Was sent to the wrong spot... She thought it was sent to [facility], but it was sent to the pharmacy downstairs... She states she has only one pill left and would like someone to contact her soon :) [phone] .... Thanks

## 2012-08-10 NOTE — Telephone Encounter (Signed)
rx for ativan called in

## 2012-08-11 NOTE — Telephone Encounter (Signed)
Patient called back and will put monitor on tomorrow

## 2012-08-13 DIAGNOSIS — R002 Palpitations: Secondary | ICD-10-CM

## 2012-08-31 ENCOUNTER — Encounter: Payer: Self-pay | Admitting: Internal Medicine

## 2012-08-31 ENCOUNTER — Ambulatory Visit (INDEPENDENT_AMBULATORY_CARE_PROVIDER_SITE_OTHER): Payer: Medicare Other | Admitting: Internal Medicine

## 2012-08-31 VITALS — BP 137/77 | HR 73 | Temp 97.4°F | Resp 20 | Wt 131.0 lb

## 2012-08-31 DIAGNOSIS — R300 Dysuria: Secondary | ICD-10-CM

## 2012-08-31 DIAGNOSIS — N390 Urinary tract infection, site not specified: Secondary | ICD-10-CM

## 2012-08-31 DIAGNOSIS — R350 Frequency of micturition: Secondary | ICD-10-CM

## 2012-08-31 LAB — POCT URINALYSIS DIPSTICK
Ketones, UA: NEGATIVE
Protein, UA: NEGATIVE
Spec Grav, UA: 1.01
Urobilinogen, UA: >=8

## 2012-08-31 MED ORDER — CIPROFLOXACIN HCL 250 MG PO TABS: 250.0000 mg | ORAL_TABLET | Freq: Two times a day (BID) | ORAL | Status: DC

## 2012-08-31 NOTE — Progress Notes (Signed)
Subjective:    Patient ID: Hannah Galvan, female    DOB: Oct 14, 1931, 78 y.o.   MRN: [id number]  HPI  Jerre is here fro acute visit.  She has been having urinary urgency and burning last 2 days/  No fever , nausea or flank pain  Allergies  Allergen Reactions  . Pyridium (Phenazopyridine Hcl) Other (See Comments)    hemolysis  . Sulfa Antibiotics Rash   Past Medical History  Diagnosis Date  . Thyroid disease   . Hypertension   . Hyperlipidemia   . Arthritis   . Cancer     breast, s/p RXT, bilateral   Past Surgical History  Procedure Date  . Abdominal hysterectomy 2008  . Thyroid surgery 2002  . Breast lumpectomy 2000  . Breast lumpectomy 2007  . Cataract extraction 2011  . Rotator cuff repair     right  . Knee surgery     right   History   Social History  . Marital Status: Married    Spouse Name: N/A    Number of Children: N/A  . Years of Education: N/A   Occupational History  . Not on file.   Social History Main Topics  . Smoking status: Never Smoker   . Smokeless tobacco: Never Used  . Alcohol Use: No  . Drug Use: No  . Sexually Active: Not Currently   Other Topics Concern  . Not on file   Social History Narrative  . No narrative on file   Family History  Problem Relation Age of Onset  . Hypertension Mother   . Stroke Mother   . Cancer Father   . Cancer Sister     leukemia  . Cancer Brother     leukemia  . Cancer Sister     vaginal  . Cancer Sister     bladder  . Cancer Brother     prostate, skin  . Cancer Brother     colon  . Cancer Brother     prostate, bone mets  . Cancer Brother     skin  . Heart disease Sister    Patient Active Problem List  Diagnosis  . Thyroid disease  . Hypertension  . Hyperlipidemia  . Arthritis  . Cancer  . Edema of both legs  . Breast cancer  . Gait disturbance  . Osteopenia  . Cholelithiasis with cholecystitis without obstruction  . Liver hemangioma  . Pulmonary nodule, right  . Heart murmur,  aortic  . Dizziness - giddy  . UTI (lower urinary tract infection)   Current Outpatient Prescriptions on File Prior to Visit  Medication Sig Dispense Refill  . aspirin 81 MG tablet Take 81 mg by mouth daily.        . Calcium Carbonate-Vitamin D (CALCIUM + D) 600-200 MG-UNIT TABS Take 1 tablet by mouth 2 (two) times daily.        . Cyanocobalamin (VITAMIN B-12) 5000 MCG SUBL Take 1 tablet by mouth daily.        Marland Kitchen exemestane (AROMASIN) 25 MG tablet Take 1 tablet (25 mg total) by mouth daily after breakfast.  90 tablet  4  . folic acid (FOLVITE) 1 MG tablet Take 1 tablet (1 mg total) by mouth daily.  90 tablet  1  . levothyroxine (SYNTHROID) 125 MCG tablet Take 1 tablet (125 mcg total) by mouth daily.  90 tablet  1  . LORazepam (ATIVAN) 1 MG tablet Take 1 tablet (1 mg total) by mouth at bedtime.  30 tablet  1  . losartan-hydrochlorothiazide (HYZAAR) 100-25 MG per tablet Take 1 tablet by mouth daily.        . Magnesium 500 MG CAPS Take 1 capsule by mouth daily.        . Multiple Vitamins-Minerals (CENTRUM SILVER PO) Take by mouth. CALTRATE       . Omega-3 Fatty Acids (FISH OIL) 1000 MG CAPS Take 1 capsule by mouth daily.        . rosuvastatin (CRESTOR) 10 MG tablet Take 10 mg by mouth as directed. 1/2 tablet daily       Current Facility-Administered Medications on File Prior to Visit  Medication Dose Route Frequency Provider Last Rate Last Dose  . pneumococcal 23 valent vaccine (PNU-IMMUNE) injection 0.5 mL  0.5 mL Intramuscular Once Kendrick Ranch, MD          Review of Systems See HPI    Objective:   Physical Exam  Physical Exam  Nursing note and vitals reviewed.  Constitutional: She is oriented to person, place, and time. She appears well-developed and well-nourished.  HENT:  Head: Normocephalic and atraumatic.  Cardiovascular: Normal rate and regular rhythm. Exam reveals no gallop and no friction rub.  No murmur heard.  Pulmonary/Chest: Breath sounds normal. She has no  wheezes. She has no rales.  ABD:  Soft /ND  No CVA tendernes  Pos suprapubic tenderness Neurological: She is alert and oriented to person, place, and time.  Skin: Skin is warm and dry.  Psychiatric: She has a normal mood and affect. Her behavior is normal.            Assessment & Plan:  UTI  Will give cipro 250 mg bid for 7 days send for culture  Dysuria  Due to above  Will give influenza when pt feels better

## 2012-09-03 ENCOUNTER — Other Ambulatory Visit: Payer: Self-pay | Admitting: Internal Medicine

## 2012-09-04 LAB — URINE CULTURE

## 2012-09-07 ENCOUNTER — Telehealth: Payer: Self-pay | Admitting: *Deleted

## 2012-09-07 ENCOUNTER — Telehealth: Payer: Self-pay | Admitting: Cardiology

## 2012-09-07 NOTE — Telephone Encounter (Signed)
plz return call to patient [phone] who has been wearing a heart monitor over the last 4 weeks.  Pt said it is giving her a message that says "Memory Full"

## 2012-09-07 NOTE — Telephone Encounter (Signed)
Pt notified of culture results and will follow up with appt after seeing urologist

## 2012-09-30 ENCOUNTER — Other Ambulatory Visit: Payer: Self-pay | Admitting: Internal Medicine

## 2012-10-04 ENCOUNTER — Other Ambulatory Visit (INDEPENDENT_AMBULATORY_CARE_PROVIDER_SITE_OTHER): Payer: Medicare Other

## 2012-10-04 ENCOUNTER — Other Ambulatory Visit: Payer: Self-pay | Admitting: Internal Medicine

## 2012-10-04 DIAGNOSIS — E7800 Pure hypercholesterolemia, unspecified: Secondary | ICD-10-CM

## 2012-10-04 LAB — LIPID PANEL
Cholesterol: 164 mg/dL (ref 0–200)
LDL Cholesterol: 91 mg/dL (ref 0–99)
Triglycerides: 64 mg/dL (ref 0.0–149.0)
VLDL: 12.8 mg/dL (ref 0.0–40.0)

## 2012-10-04 LAB — BASIC METABOLIC PANEL
BUN: 13 mg/dL (ref 6–23)
Chloride: 94 mEq/L — ABNORMAL LOW (ref 96–112)
Creatinine, Ser: 0.7 mg/dL (ref 0.4–1.2)
Glucose, Bld: 89 mg/dL (ref 70–99)
Potassium: 3.4 mEq/L — ABNORMAL LOW (ref 3.5–5.1)

## 2012-10-04 LAB — HEPATIC FUNCTION PANEL
ALT: 22 U/L (ref 0–35)
Total Bilirubin: 0.7 mg/dL (ref 0.3–1.2)

## 2012-10-04 NOTE — Telephone Encounter (Signed)
Pt states she is out of her LORazepam (Tab) ATIVAN 1 MG TAKE 1 TABLET AT BEDTIME She needs it refill... It looks like it came in on thru the pharmacy 11/07... Pt was told it may be down at the pharmacy but she called and they stated they have not recd approval..Marland Kitchen

## 2012-10-04 NOTE — Telephone Encounter (Signed)
Ativan called in to pharmacy

## 2012-10-04 NOTE — Progress Notes (Signed)
Quick Note:  Please make copy of labs for patient visit. ______ 

## 2012-10-11 ENCOUNTER — Other Ambulatory Visit: Payer: Medicare Other

## 2012-10-11 ENCOUNTER — Ambulatory Visit: Payer: Medicare Other | Admitting: Cardiology

## 2012-10-15 ENCOUNTER — Ambulatory Visit: Payer: Self-pay | Admitting: Cardiology

## 2012-10-19 ENCOUNTER — Ambulatory Visit: Payer: Self-pay | Admitting: Cardiology

## 2012-10-25 ENCOUNTER — Other Ambulatory Visit: Payer: Self-pay | Admitting: Internal Medicine

## 2012-10-25 NOTE — Telephone Encounter (Signed)
Pt would like a call from the nurse, Karen Kitchens, she states she want to discuss her medicine... [phone]

## 2012-10-28 MED ORDER — FOLIC ACID 1 MG PO TABS: 1.0000 mg | ORAL_TABLET | Freq: Every day | ORAL | Status: DC

## 2012-10-28 MED ORDER — LEVOTHYROXINE SODIUM 125 MCG PO TABS: 125.0000 ug | ORAL_TABLET | Freq: Every day | ORAL | Status: DC

## 2012-10-28 NOTE — Telephone Encounter (Signed)
Refill request verified that pt  would like these to go to mail order

## 2012-11-01 ENCOUNTER — Telehealth: Payer: Self-pay | Admitting: *Deleted

## 2012-11-01 DIAGNOSIS — E876 Hypokalemia: Secondary | ICD-10-CM

## 2012-11-01 MED ORDER — POTASSIUM CHLORIDE CRYS ER 20 MEQ PO TBCR: 20.0000 meq | EXTENDED_RELEASE_TABLET | Freq: Every day | ORAL | Status: DC

## 2012-11-01 NOTE — Telephone Encounter (Signed)
Patient potassium was low and was unable to come to office visit secondary to husband being in the hospital.   Dr. Patty Sermons reviewed labs and her potassium was low (3.4). Will add Kdur 20 meq daily and recheck in 1 month.  Advised patient and appointment scheduled

## 2012-11-02 ENCOUNTER — Other Ambulatory Visit: Payer: Self-pay | Admitting: Hematology & Oncology

## 2012-11-02 DIAGNOSIS — Z853 Personal history of malignant neoplasm of breast: Secondary | ICD-10-CM

## 2012-11-02 DIAGNOSIS — Z9889 Other specified postprocedural states: Secondary | ICD-10-CM

## 2012-11-10 ENCOUNTER — Ambulatory Visit
Admission: RE | Admit: 2012-11-10 | Discharge: 2012-11-10 | Disposition: A | Discharge status: Home / Self Care | Payer: Medicare Other | Source: Ambulatory Visit | Attending: Hematology & Oncology | Admitting: Hematology & Oncology

## 2012-11-10 DIAGNOSIS — Z9889 Other specified postprocedural states: Secondary | ICD-10-CM

## 2012-11-10 DIAGNOSIS — Z853 Personal history of malignant neoplasm of breast: Secondary | ICD-10-CM

## 2012-11-11 ENCOUNTER — Telehealth (INDEPENDENT_AMBULATORY_CARE_PROVIDER_SITE_OTHER): Payer: Self-pay | Admitting: General Surgery

## 2012-11-11 NOTE — Telephone Encounter (Signed)
Spoke with pt and informed her that her MGM came back negative.   °

## 2012-11-11 NOTE — Telephone Encounter (Signed)
Message copied by Littie Deeds on Thu Nov 11, 2012  2:01 PM ------      Message from: Caleen Essex III      Created: Thu Nov 11, 2012  7:19 AM       Looks neg

## 2012-11-16 ENCOUNTER — Encounter: Payer: Self-pay | Admitting: Cardiology

## 2012-11-29 ENCOUNTER — Emergency Department (HOSPITAL_COMMUNITY)
Admission: EM | Admit: 2012-11-29 | Discharge: 2012-11-29 | Disposition: A | Discharge status: Home / Self Care | Payer: Medicare Other | Attending: Emergency Medicine | Admitting: Emergency Medicine

## 2012-11-29 ENCOUNTER — Encounter (HOSPITAL_COMMUNITY): Payer: Self-pay | Admitting: *Deleted

## 2012-11-29 DIAGNOSIS — I10 Essential (primary) hypertension: Secondary | ICD-10-CM | POA: Insufficient documentation

## 2012-11-29 DIAGNOSIS — Z853 Personal history of malignant neoplasm of breast: Secondary | ICD-10-CM | POA: Insufficient documentation

## 2012-11-29 DIAGNOSIS — Z90710 Acquired absence of both cervix and uterus: Secondary | ICD-10-CM | POA: Insufficient documentation

## 2012-11-29 DIAGNOSIS — B349 Viral infection, unspecified: Secondary | ICD-10-CM

## 2012-11-29 DIAGNOSIS — E079 Disorder of thyroid, unspecified: Secondary | ICD-10-CM | POA: Insufficient documentation

## 2012-11-29 DIAGNOSIS — Z7982 Long term (current) use of aspirin: Secondary | ICD-10-CM | POA: Insufficient documentation

## 2012-11-29 DIAGNOSIS — Z79899 Other long term (current) drug therapy: Secondary | ICD-10-CM | POA: Insufficient documentation

## 2012-11-29 DIAGNOSIS — R6883 Chills (without fever): Secondary | ICD-10-CM | POA: Insufficient documentation

## 2012-11-29 DIAGNOSIS — M129 Arthropathy, unspecified: Secondary | ICD-10-CM | POA: Insufficient documentation

## 2012-11-29 DIAGNOSIS — E785 Hyperlipidemia, unspecified: Secondary | ICD-10-CM | POA: Insufficient documentation

## 2012-11-29 DIAGNOSIS — B9789 Other viral agents as the cause of diseases classified elsewhere: Secondary | ICD-10-CM | POA: Insufficient documentation

## 2012-11-29 DIAGNOSIS — R000 Tachycardia, unspecified: Secondary | ICD-10-CM | POA: Insufficient documentation

## 2012-11-29 DIAGNOSIS — R197 Diarrhea, unspecified: Secondary | ICD-10-CM | POA: Insufficient documentation

## 2012-11-29 DIAGNOSIS — R112 Nausea with vomiting, unspecified: Secondary | ICD-10-CM | POA: Insufficient documentation

## 2012-11-29 LAB — BASIC METABOLIC PANEL
CO2: 24 mEq/L (ref 19–32)
Chloride: 96 mEq/L (ref 96–112)
Creatinine, Ser: 0.66 mg/dL (ref 0.50–1.10)
Potassium: 3.2 mEq/L — ABNORMAL LOW (ref 3.5–5.1)
Sodium: 133 mEq/L — ABNORMAL LOW (ref 135–145)

## 2012-11-29 LAB — URINALYSIS, ROUTINE W REFLEX MICROSCOPIC
Glucose, UA: NEGATIVE mg/dL
Leukocytes, UA: NEGATIVE
Specific Gravity, Urine: 1.025 (ref 1.005–1.030)
pH: 7 (ref 5.0–8.0)

## 2012-11-29 LAB — CBC WITH DIFFERENTIAL/PLATELET
Basophils Absolute: 0 10*3/uL (ref 0.0–0.1)
HCT: 42.2 % (ref 36.0–46.0)
Hemoglobin: 14.4 g/dL (ref 12.0–15.0)
Lymphocytes Relative: 5 % — ABNORMAL LOW (ref 12–46)
Monocytes Absolute: 0.4 10*3/uL (ref 0.1–1.0)
Neutro Abs: 9.9 10*3/uL — ABNORMAL HIGH (ref 1.7–7.7)
RDW: 13.2 % (ref 11.5–15.5)
WBC: 10.9 10*3/uL — ABNORMAL HIGH (ref 4.0–10.5)

## 2012-11-29 MED ORDER — ONDANSETRON 8 MG PO TBDP: ORAL_TABLET | ORAL | Status: DC

## 2012-11-29 MED ORDER — SODIUM CHLORIDE 0.9 % IV BOLUS (SEPSIS)
1000.0000 mL | Freq: Once | INTRAVENOUS | Status: AC
  Administered 2012-11-29: 1000 mL via INTRAVENOUS

## 2012-11-29 MED ORDER — ONDANSETRON HCL 4 MG/2ML IJ SOLN
4.0000 mg | Freq: Once | INTRAMUSCULAR | Status: AC
  Administered 2012-11-29: 4 mg via INTRAVENOUS
  Filled 2012-11-29: qty 2

## 2012-11-29 NOTE — ED Provider Notes (Signed)
History     CSN: [id number]  Arrival date & time 11/29/12  0407   First MD Initiated Contact with Patient 11/29/12 [phone]      Chief Complaint  Patient presents with  . Nausea, vomiting and diarrhea    HPI  History provided by the patient. Patient is a 79 year old female with history of hypertension, hyperlipidemia, past breast cancer in remission who presents with complaints of acute onset nausea vomiting and diarrhea episode. Patient states symptoms first began around 9 PM and has had multiple frequent episodes of vomiting. She also reports having one very loose and soft "diarrhea" type stool. She denies any hematemesis or coffee-ground emesis. Denies any blood in stool. She denies any abdominal pain but reports some discomfort and general nausea. Symptoms have not been associated with any fever. Patient does report being around sick family members with similar symptoms. She has also had recent visits to the hospital for her husband. Denies any other travel. Patient did use some Phenergan from at home to help with vomiting symptoms without significant improvement. Denies any other aggravating or alleviating factors.    Past Medical History  Diagnosis Date  . Thyroid disease   . Hypertension   . Hyperlipidemia   . Arthritis   . Cancer     breast, s/p RXT, bilateral    Past Surgical History  Procedure Date  . Abdominal hysterectomy 2008  . Thyroid surgery 2002  . Breast lumpectomy 2000  . Breast lumpectomy 2007  . Cataract extraction 2011  . Rotator cuff repair     right  . Knee surgery     right    Family History  Problem Relation Age of Onset  . Hypertension Mother   . Stroke Mother   . Cancer Father   . Cancer Sister     leukemia  . Cancer Brother     leukemia  . Cancer Sister     vaginal  . Cancer Sister     bladder  . Cancer Brother     prostate, skin  . Cancer Brother     colon  . Cancer Brother     prostate, bone mets  . Cancer Brother     skin  .  Heart disease Sister     History  Substance Use Topics  . Smoking status: Never Smoker   . Smokeless tobacco: Never Used  . Alcohol Use: No    OB History    Grav Para Term Preterm Abortions TAB SAB Ect Mult Living   2 2              Review of Systems  Constitutional: Positive for chills. Negative for fever.  HENT: Negative for congestion and rhinorrhea.   Respiratory: Negative for cough.   Gastrointestinal: Positive for nausea, vomiting and diarrhea. Negative for abdominal pain and constipation.  Genitourinary: Negative for dysuria, frequency and hematuria.  All other systems reviewed and are negative.    Allergies  Pyridium and Sulfa antibiotics  Home Medications   Current Outpatient Rx  Name  Route  Sig  Dispense  Refill  . ASPIRIN 81 MG PO TABS   Oral   Take 81 mg by mouth daily.           Marland Kitchen CALCIUM CARBONATE-VITAMIN D 600-200 MG-UNIT PO TABS   Oral   Take 1 tablet by mouth 2 (two) times daily.           Marland Kitchen CIPROFLOXACIN HCL 250 MG PO TABS   Oral  Take 1 tablet (250 mg total) by mouth 2 (two) times daily.   14 tablet   0   . VITAMIN B-12 5000 MCG SL SUBL   Oral   Take 1 tablet by mouth daily.           Marland Kitchen EXEMESTANE 25 MG PO TABS   Oral   Take 1 tablet (25 mg total) by mouth daily after breakfast.   90 tablet   4   . FOLIC ACID 1 MG PO TABS   Oral   Take 1 tablet (1 mg total) by mouth daily.   90 tablet   1   . LEVOTHYROXINE SODIUM 125 MCG PO TABS   Oral   Take 1 tablet (125 mcg total) by mouth daily.   90 tablet   1   . LORAZEPAM 1 MG PO TABS      TAKE 1 TABLET AT BEDTIME   30 tablet   1   . LOSARTAN POTASSIUM-HCTZ 100-25 MG PO TABS   Oral   Take 1 tablet by mouth daily.           Marland Kitchen MAGNESIUM 500 MG PO CAPS   Oral   Take 1 capsule by mouth daily.           . CENTRUM SILVER PO   Oral   Take by mouth. CALTRATE          . FISH OIL 1000 MG PO CAPS   Oral   Take 1 capsule by mouth daily.           Marland Kitchen POTASSIUM  CHLORIDE CRYS ER 20 MEQ PO TBCR   Oral   Take 1 tablet (20 mEq total) by mouth daily.   90 tablet   1   . ROSUVASTATIN CALCIUM 10 MG PO TABS   Oral   Take 10 mg by mouth as directed. 1/2 tablet daily           There were no vitals taken for this visit.  Physical Exam  Nursing note and vitals reviewed. Constitutional: She is oriented to person, place, and time. She appears well-developed and well-nourished. No distress.  HENT:  Head: Normocephalic.  Mouth/Throat: Oropharynx is clear and moist.  Cardiovascular: Regular rhythm.  Tachycardia present.   Pulmonary/Chest: Effort normal and breath sounds normal. No respiratory distress. She has no wheezes. She has no rales.  Abdominal: Soft. There is no tenderness. There is no rebound and no guarding.  Neurological: She is alert and oriented to person, place, and time.  Skin: Skin is warm and dry. No rash noted.  Psychiatric: She has a normal mood and affect. Her behavior is normal.    ED Course  Procedures  Results for orders placed during the hospital encounter of 11/29/12  CBC WITH DIFFERENTIAL      Component Value Range   WBC 10.9 (*) 4.0 - 10.5 K/uL   RBC 4.62  3.87 - 5.11 MIL/uL   Hemoglobin 14.4  12.0 - 15.0 g/dL   HCT 09.8  11.9 - 14.7 %   MCV 91.3  78.0 - 100.0 fL   MCH 31.2  26.0 - 34.0 pg   MCHC 34.1  30.0 - 36.0 g/dL   RDW 82.9  56.2 - 13.0 %   Platelets 264  150 - 400 K/uL   Neutrophils Relative 91 (*) 43 - 77 %   Neutro Abs 9.9 (*) 1.7 - 7.7 K/uL   Lymphocytes Relative 5 (*) 12 - 46 %   Lymphs  Abs 0.6 (*) 0.7 - 4.0 K/uL   Monocytes Relative 4  3 - 12 %   Monocytes Absolute 0.4  0.1 - 1.0 K/uL   Eosinophils Relative 0  0 - 5 %   Eosinophils Absolute 0.0  0.0 - 0.7 K/uL   Basophils Relative 0  0 - 1 %   Basophils Absolute 0.0  0.0 - 0.1 K/uL  BASIC METABOLIC PANEL      Component Value Range   Sodium 133 (*) 135 - 145 mEq/L   Potassium 3.2 (*) 3.5 - 5.1 mEq/L   Chloride 96  96 - 112 mEq/L   CO2 24  19 -  32 mEq/L   Glucose, Bld 137 (*) 70 - 99 mg/dL   BUN 24 (*) 6 - 23 mg/dL   Creatinine, Ser 7.84  0.50 - 1.10 mg/dL   Calcium 9.0  8.4 - 69.6 mg/dL   GFR calc non Af Amer 82 (*) >90 mL/min   GFR calc Af Amer >90  >90 mL/min  URINALYSIS, ROUTINE W REFLEX MICROSCOPIC      Component Value Range   Color, Urine YELLOW  YELLOW   APPearance CLEAR  CLEAR   Specific Gravity, Urine 1.025  1.005 - 1.030   pH 7.0  5.0 - 8.0   Glucose, UA NEGATIVE  NEGATIVE mg/dL   Hgb urine dipstick NEGATIVE  NEGATIVE   Bilirubin Urine NEGATIVE  NEGATIVE   Ketones, ur 15 (*) NEGATIVE mg/dL   Protein, ur NEGATIVE  NEGATIVE mg/dL   Urobilinogen, UA 0.2  0.0 - 1.0 mg/dL   Nitrite NEGATIVE  NEGATIVE   Leukocytes, UA NEGATIVE  NEGATIVE        1. Nausea vomiting and diarrhea   2. Viral syndrome       MDM  4:10AM patient seen and evaluated. Patient resting comfortably at this time in no acute distress.  Patient feeling better after IV fluids and IV Zofran. She is tolerating by mouth fluids.  Patient seen and evaluated by attending physician. This time suspect viral process. Labs unremarkable otherwise. Will discharge at this time with prescription for Zofran.      Angus Seller, [place] 11/29/12 [phone]

## 2012-11-29 NOTE — ED Notes (Signed)
FAO:[id number]<MV> Expected date:<BR> Expected time:<BR> Means of arrival:<BR> Comments:<BR> 34F/Vomiting

## 2012-11-29 NOTE — ED Provider Notes (Signed)
Medical screening examination/treatment/procedure(s) were conducted as a shared visit with non-physician practitioner(s) and myself.  I personally evaluated the patient during the encounter  Patient's abdomen is benign.  Her nausea is significantly better.  I recommended that she follow up closely with her doctor the next one to 2 days if her symptoms continue.  She was told to return to the ER for new or worsening symptoms.  I suspect this is more of a viral illness.  No fever or myalgias  Lyanne Co, MD 11/29/12 [phone]

## 2012-11-29 NOTE — ED Notes (Signed)
Pt to ED via EMS from home with complaints of Nausea and vomiting since 9pm; pt unsure how many times she has vomited; reports diarrhea x 1; denies abd pain; c/o chills.

## 2012-12-06 ENCOUNTER — Encounter: Payer: Self-pay | Admitting: Cardiology

## 2012-12-06 ENCOUNTER — Other Ambulatory Visit (INDEPENDENT_AMBULATORY_CARE_PROVIDER_SITE_OTHER): Payer: Medicare Other

## 2012-12-06 ENCOUNTER — Ambulatory Visit (INDEPENDENT_AMBULATORY_CARE_PROVIDER_SITE_OTHER): Payer: Medicare Other | Admitting: Cardiology

## 2012-12-06 VITALS — BP 138/68 | HR 76 | Resp 18 | Ht 62.0 in | Wt 132.0 lb

## 2012-12-06 DIAGNOSIS — E785 Hyperlipidemia, unspecified: Secondary | ICD-10-CM

## 2012-12-06 DIAGNOSIS — E079 Disorder of thyroid, unspecified: Secondary | ICD-10-CM

## 2012-12-06 DIAGNOSIS — I10 Essential (primary) hypertension: Secondary | ICD-10-CM

## 2012-12-06 DIAGNOSIS — E876 Hypokalemia: Secondary | ICD-10-CM

## 2012-12-06 DIAGNOSIS — E7800 Pure hypercholesterolemia, unspecified: Secondary | ICD-10-CM

## 2012-12-06 DIAGNOSIS — R0989 Other specified symptoms and signs involving the circulatory and respiratory systems: Secondary | ICD-10-CM

## 2012-12-06 LAB — BASIC METABOLIC PANEL
CO2: 33 mEq/L — ABNORMAL HIGH (ref 19–32)
Chloride: 98 mEq/L (ref 96–112)
Potassium: 3.1 mEq/L — ABNORMAL LOW (ref 3.5–5.1)
Sodium: 138 mEq/L (ref 135–145)

## 2012-12-06 NOTE — Progress Notes (Signed)
Hannah Galvan Date of Birth:  Apr 04, 1931 [facility] [address] [place], [place]  [address] [phone]         Fax   [phone]  History of Present Illness: This pleasant 79 year old woman is seen for a followup office visit.  She was last seen in August 2013 at which time she was  complaining of shortness of breath and fatigue. She also complains that her head feels funny. She notes occasional palpitations and racing of her heart. She has had several mild dizzy spells but no syncope. She has been in good general health. She has had cancer of the right breast in 2000 and cancer of the left breast in 2007 and she is on Aromasin long-term. She does have mild hot flashes. She does have a past history of elevated cholesterol but had stopped taking her Crestor because of some aching in her legs. Her most recent LDL was significantly elevated and she restarted her Crestor in a lower dose of 5 mg daily. She has a history of a heart murmur.  She had an echocardiogram 07/21/12 which showed mild aortic insufficiency and mild mitral regurgitation and normal left jugular systolic function.  She did have a chest x-ray 02/12/12 which was satisfactory.  Since last visit she has been doing better.  Her main complaint is being tired from being the caregiver for her husband who is now under hospice.   Current Outpatient Prescriptions  Medication Sig Dispense Refill  . aspirin 81 MG tablet Take 81 mg by mouth daily.        . Calcium Carbonate-Vitamin D (CALCIUM + D) 600-200 MG-UNIT TABS Take 1 tablet by mouth 2 (two) times daily.        . Cyanocobalamin (VITAMIN B-12) 5000 MCG SUBL Take 1 tablet by mouth daily.        Marland Kitchen exemestane (AROMASIN) 25 MG tablet Take 1 tablet (25 mg total) by mouth daily after breakfast.  90 tablet  4  . folic acid (FOLVITE) 1 MG tablet Take 1 tablet (1 mg total) by mouth daily.  90 tablet  1  . levothyroxine (SYNTHROID) 125 MCG tablet Take 1 tablet (125 mcg  total) by mouth daily.  90 tablet  1  . LORazepam (ATIVAN) 1 MG tablet TAKE 1 TABLET AT BEDTIME  30 tablet  1  . losartan-hydrochlorothiazide (HYZAAR) 100-25 MG per tablet Take 1 tablet by mouth daily.        . ondansetron (ZOFRAN ODT) 8 MG disintegrating tablet 8mg  ODT q4 hours prn nausea  20 tablet  0  . potassium chloride SA (K-DUR,KLOR-CON) 20 MEQ tablet Take 1 tablet (20 mEq total) by mouth daily.  90 tablet  1  . rosuvastatin (CRESTOR) 10 MG tablet Take 10 mg by mouth as directed. 1/2 tablet daily       Current Facility-Administered Medications  Medication Dose Route Frequency Provider Last Rate Last Dose  . pneumococcal 23 valent vaccine (PNU-IMMUNE) injection 0.5 mL  0.5 mL Intramuscular Once Kendrick Ranch, MD        Allergies  Allergen Reactions  . Pyridium (Phenazopyridine Hcl) Other (See Comments)    hemolysis  . Sulfa Antibiotics Rash    Patient Active Problem List  Diagnosis  . Thyroid disease  . Hypertension  . Hyperlipidemia  . Arthritis  . Cancer  . Edema of both legs  . Breast cancer  . Gait disturbance  . Osteopenia  . Cholelithiasis with cholecystitis without obstruction  . Liver  hemangioma  . Pulmonary nodule, right  . Heart murmur, aortic  . Dizziness - giddy  . UTI (lower urinary tract infection)    History  Smoking status  . Never Smoker   Smokeless tobacco  . Never Used    History  Alcohol Use No    Family History  Problem Relation Age of Onset  . Hypertension Mother   . Stroke Mother   . Cancer Father   . Cancer Sister     leukemia  . Cancer Brother     leukemia  . Cancer Sister     vaginal  . Cancer Sister     bladder  . Cancer Brother     prostate, skin  . Cancer Brother     colon  . Cancer Brother     prostate, bone mets  . Cancer Brother     skin  . Heart disease Sister     Review of Systems: Constitutional: no fever chills diaphoresis or fatigue or change in weight.  Head and neck: no hearing loss, no  epistaxis, no photophobia or visual disturbance. Respiratory: No cough, shortness of breath or wheezing. Cardiovascular: No chest pain peripheral edema, palpitations. Gastrointestinal: No abdominal distention, no abdominal pain, no change in bowel habits hematochezia or melena. Genitourinary: No dysuria, no frequency, no urgency, no nocturia. Musculoskeletal:No arthralgias, no back pain, no gait disturbance or myalgias. Neurological: No dizziness, no headaches, no numbness, no seizures, no syncope, no weakness, no tremors. Hematologic: No lymphadenopathy, no easy bruising. Psychiatric: No confusion, no hallucinations, no sleep disturbance.    Physical Exam: Filed Vitals:   12/06/12 1426  BP: 138/68  Pulse: 76  Resp:    the general appearance reveals a well-developed well-nourished elderly woman in no distress.The head and neck exam reveals pupils equal and reactive.  Extraocular movements are full.  There is no scleral icterus.  The mouth and pharynx are normal.  The neck is supple.  The carotids reveal no bruits.  The jugular venous pressure is normal.  The  thyroid is not enlarged.  There is no lymphadenopathy.  The chest is clear to percussion and auscultation.  There are no rales or rhonchi.  Expansion of the chest is symmetrical.  The precordium is quiet.  The first heart sound is normal.  The second heart sound is physiologically split.  There is no  gallop rub or click.  There is a grade 2/6 systolic ejection murmur at the aortic area.  No diastolic murmur heard.  There is no abnormal lift or heave.  The abdomen is soft and nontender.  The bowel sounds are normal.  The liver and spleen are not enlarged.  There are no abdominal masses.  There are no abdominal bruits.  Extremities reveal good pedal pulses.  There is no phlebitis or edema.  There is no cyanosis or clubbing.  Strength is normal and symmetrical in all extremities.  There is no lateralizing weakness.  There are no sensory  deficits.  The skin is warm and dry.  There is no rash.     Assessment / Plan: Continue on same medication.  We are checking a basal metabolic panel today because the patient had recent hypokalemia and an emergency room visit a week ago for the GI flu.  Recheck in 4 months for followup office visit lipid panel hepatic function panel and basal metabolic panel.

## 2012-12-06 NOTE — Assessment & Plan Note (Signed)
The patient is clinically euthyroid on current therapy 

## 2012-12-06 NOTE — Assessment & Plan Note (Signed)
The blood pressure was remaining stable on current therapy.  She has not been experiencing any recent racing of her heart and she is really having any dizziness.  She denies any recent chest pain orthopnea.  She sleeps on one pillow.

## 2012-12-06 NOTE — Assessment & Plan Note (Signed)
The patient has a history of hyperlipidemia.  She is tolerating a lower dose of Crestor 5 mg daily.

## 2012-12-06 NOTE — Patient Instructions (Addendum)
Will obtain labs today and call you with the results (bmet)  Your physician recommends that you continue on your current medications as directed. Please refer to the Current Medication list given to you today.  Your physician wants you to follow-up in: 4 months with fasting labs (lp/bmet/hfp)  You will receive a reminder letter in the mail two months in advance. If you don't receive a letter, please call our office to schedule the follow-up appointment.  

## 2012-12-08 ENCOUNTER — Telehealth: Payer: Self-pay | Admitting: *Deleted

## 2012-12-08 ENCOUNTER — Other Ambulatory Visit: Payer: Self-pay | Admitting: *Deleted

## 2012-12-08 DIAGNOSIS — Z79899 Other long term (current) drug therapy: Secondary | ICD-10-CM

## 2012-12-08 NOTE — Telephone Encounter (Signed)
Message copied by Burnell Blanks on Wed Dec 08, 2012  6:39 PM ------      Message from: Cassell Clement      Created: Mon Dec 06, 2012  8:46 PM       K is still too low.  Decrease losartan HCT to half tablet a day.  Increase KDur to 20 meq BID.  Recheck BMET in 2-3 weeks

## 2012-12-13 ENCOUNTER — Encounter: Payer: Self-pay | Admitting: Hematology & Oncology

## 2012-12-15 ENCOUNTER — Ambulatory Visit (HOSPITAL_BASED_OUTPATIENT_CLINIC_OR_DEPARTMENT_OTHER): Payer: Medicare Other | Admitting: Hematology & Oncology

## 2012-12-15 ENCOUNTER — Other Ambulatory Visit (HOSPITAL_BASED_OUTPATIENT_CLINIC_OR_DEPARTMENT_OTHER): Payer: Medicare Other | Admitting: Lab

## 2012-12-15 VITALS — BP 129/68 | HR 72 | Temp 97.8°F | Resp 16 | Ht 62.0 in | Wt 134.0 lb

## 2012-12-15 DIAGNOSIS — M810 Age-related osteoporosis without current pathological fracture: Secondary | ICD-10-CM

## 2012-12-15 DIAGNOSIS — C50919 Malignant neoplasm of unspecified site of unspecified female breast: Secondary | ICD-10-CM

## 2012-12-15 DIAGNOSIS — D0590 Unspecified type of carcinoma in situ of unspecified breast: Secondary | ICD-10-CM

## 2012-12-15 DIAGNOSIS — Z853 Personal history of malignant neoplasm of breast: Secondary | ICD-10-CM

## 2012-12-15 LAB — CBC WITH DIFFERENTIAL (CANCER CENTER ONLY)
BASO#: 0 10*3/uL (ref 0.0–0.2)
Eosinophils Absolute: 0.1 10*3/uL (ref 0.0–0.5)
HGB: 12.9 g/dL (ref 11.6–15.9)
LYMPH%: 31.4 % (ref 14.0–48.0)
MCH: 32.1 pg (ref 26.0–34.0)
MCV: 96 fL (ref 81–101)
MONO#: 0.6 10*3/uL (ref 0.1–0.9)
MONO%: 9.6 % (ref 0.0–13.0)
RBC: 4.02 10*6/uL (ref 3.70–5.32)
WBC: 6.3 10*3/uL (ref 3.9–10.0)

## 2012-12-15 NOTE — Progress Notes (Signed)
This office note has been dictated.

## 2012-12-16 LAB — COMPREHENSIVE METABOLIC PANEL
Albumin: 4.5 g/dL (ref 3.5–5.2)
Alkaline Phosphatase: 52 U/L (ref 39–117)
BUN: 16 mg/dL (ref 6–23)
CO2: 28 mEq/L (ref 19–32)
Glucose, Bld: 87 mg/dL (ref 70–99)
Potassium: 4.1 mEq/L (ref 3.5–5.3)
Total Bilirubin: 0.4 mg/dL (ref 0.3–1.2)
Total Protein: 6.9 g/dL (ref 6.0–8.3)

## 2012-12-16 LAB — VITAMIN D 25 HYDROXY (VIT D DEFICIENCY, FRACTURES): Vit D, 25-Hydroxy: 89 ng/mL (ref 30–89)

## 2012-12-16 NOTE — Progress Notes (Signed)
CC:   Hannah Galvan, M.D.  DIAGNOSES: 1. Ductal carcinoma in situ of the left breast. 2. History of stage IIA (T1 N1 M0) ductal carcinoma of the right     breast. 3. History of drug-induced hemolytic anemia.  CURRENT THERAPY:  Aromasin 25 mg p.o. daily.  INTERIM HISTORY:  Hannah Galvan comes in for followup.  Unfortunately, she has a lot of issues which, thankfully, are not related to her DCIS or breast cancer.  She is still having a lot of right knee problems.  She had arthroscopic surgery on the right knee back probably about a year ago.  Unfortunately, the knee is not getting better.  She has been told she may need to have knee replacement.  She does not want have knee replacement as this would really be difficult for her, given that her husband is not doing well.  Mr. Corl, who I also see, is having problems with his heart.  He has a history of colon cancer and myeloma.  These, thankfully, are not really active issues and that he will ultimately succumb to his cardiac issues.  She is doing well with the Aromasin.  She has had no problems with arthralgias with the Aromasin.  Again, she has issues with her right knee.  She says that she has pain from the knee down into her feet bilaterally. She was wondering if she needs to see a neurologist.  I really told that I did not think a neurologist was really going to help.  There has been no cough.  There has been no change in bowel or bladder habits.  She has had no problems with infections.  Her last mammogram was done back in December 2013.  The mammogram did not show any evidence of malignancy or suspicious calcifications.  PHYSICAL EXAMINATION:  General:  This is an elderly, petite white female in no obvious distress.  Vital signs:  Temperature of 97.8, pulse 72, respiratory rate 16, blood pressure 129/68.  Weight is 134.  Head and neck:  Normocephalic, atraumatic skull.  There are no ocular or oral lesions.  There are  no palpable cervical or supraclavicular lymph nodes. Lungs:  Clear bilaterally.  Cardiac:  Regular rate and rhythm with a normal S1 and S2.  There are no murmurs, rubs, or bruits.  Abdomen: Soft with good bowel sounds.  There is no palpable abdominal mass. There is no fluid wave.  There is no palpable hepatosplenomegaly. Breasts:  Left breast with a well-healed lumpectomy in the 4 o'clock position.  There is some slight contraction of the left breast.  No distinct mass is noted in the left breast.  There is no left axillary adenopathy.  Right breast shows a well-healed lumpectomy at the 9 o'clock position.  Again, no mass is noted in the right breast.  There is no right axillary adenopathy.  Back:  Some kyphosis, which is chronic.  No tenderness is noted over the spine, ribs, or hips. Extremities:  Some swelling in the right knee.  No erythema is noted in the right knee.  There is some slight warmth over the right knee.  Left knee is unremarkable.  LABORATORY STUDIES:  White cell count is 6.3, hemoglobin 12.9, hematocrit 38.4, platelet count 239.  IMPRESSION:  Hannah Galvan is a 79 year old white female with history of ductal carcinoma in situ of the left breast.  This was diagnosed in, I think, 2007.  Her invasive carcinoma of the right breast was diagnosed back in 2000.  Again, I do not see any problems with breast cancer or noninvasive breast issues.  I feel bad that she is having the problems with her right knee. Hopefully, this will get sorted out.  I think we can see her back in 6 months.    ______________________________ Josph Macho, M.D. PRE/MEDQ  D:  12/15/2012  T:  12/16/2012  Job:  [id number]

## 2012-12-20 ENCOUNTER — Telehealth: Payer: Self-pay | Admitting: Hematology & Oncology

## 2012-12-20 NOTE — Telephone Encounter (Addendum)
Message copied by Cathi Roan on Mon Dec 20, 2012  4:10 PM ------      Message from: Josph Macho      Created: Sun Dec 19, 2012  4:27 PM       Call - labs are ok!!  Cindee Lame  12-20-12- 4:10PM  I called and spoke to patient, regarding above MD massage. Lupita Raider. LPN

## 2012-12-23 ENCOUNTER — Ambulatory Visit (INDEPENDENT_AMBULATORY_CARE_PROVIDER_SITE_OTHER): Payer: Medicare Other | Admitting: General Surgery

## 2012-12-23 ENCOUNTER — Other Ambulatory Visit: Payer: Medicare Other

## 2012-12-27 ENCOUNTER — Other Ambulatory Visit (INDEPENDENT_AMBULATORY_CARE_PROVIDER_SITE_OTHER): Payer: Medicare Other

## 2012-12-27 DIAGNOSIS — Z79899 Other long term (current) drug therapy: Secondary | ICD-10-CM

## 2012-12-27 LAB — BASIC METABOLIC PANEL
Calcium: 9.3 mg/dL (ref 8.4–10.5)
GFR: 77.91 mL/min (ref >60.00)
Glucose, Bld: 103 mg/dL — ABNORMAL HIGH (ref 70–99)
Potassium: 3.9 mEq/L (ref 3.5–5.1)
Sodium: 137 mEq/L (ref 135–145)

## 2012-12-27 NOTE — Progress Notes (Signed)
Quick Note:  Please report to patient. The recent labs are stable. Continue same medication and careful diet. ______ 

## 2012-12-28 ENCOUNTER — Telehealth: Payer: Self-pay | Admitting: *Deleted

## 2012-12-28 NOTE — Telephone Encounter (Signed)
Message copied by Burnell Blanks on Tue Dec 28, 2012  5:09 PM ------      Message from: Cassell Clement      Created: Mon Dec 27, 2012  7:59 PM       Please report to patient.  The recent labs are stable. Continue same medication and careful diet.

## 2012-12-28 NOTE — Telephone Encounter (Signed)
Advised patient of lab results  

## 2012-12-30 ENCOUNTER — Encounter (INDEPENDENT_AMBULATORY_CARE_PROVIDER_SITE_OTHER): Payer: Self-pay

## 2013-01-17 ENCOUNTER — Encounter (INDEPENDENT_AMBULATORY_CARE_PROVIDER_SITE_OTHER): Payer: Self-pay | Admitting: General Surgery

## 2013-01-17 ENCOUNTER — Ambulatory Visit (INDEPENDENT_AMBULATORY_CARE_PROVIDER_SITE_OTHER): Payer: Medicare Other | Admitting: General Surgery

## 2013-01-17 VITALS — BP 142/64 | HR 80 | Temp 97.4°F | Resp 20 | Ht 63.0 in | Wt 133.0 lb

## 2013-01-17 DIAGNOSIS — C50919 Malignant neoplasm of unspecified site of unspecified female breast: Secondary | ICD-10-CM

## 2013-01-17 NOTE — Progress Notes (Signed)
Subjective:     Patient ID: Hannah Galvan, female   DOB: 12/22/1930, 79 y.o.   MRN: [id number]  HPI The patient is a 79 year old white female who is 6 years status post left breast lumpectomy for DCIS and 13 years status post right lumpectomy and axillary lymph node dissection for a T1 N1 right breast cancer. She is mostly feeling tired and a little bit depressed. Her husband max was also a patient of mine is doing poorly from a heart standpoint and has been put on hospice. She denies any breast pain. She did have a mammogram in December that showed no evidence of malignancy.  Review of Systems  Constitutional: Positive for fatigue.  HENT: Negative.   Eyes: Negative.   Respiratory: Negative.   Cardiovascular: Negative.   Gastrointestinal: Negative.   Endocrine: Negative.   Genitourinary: Negative.   Musculoskeletal: Positive for arthralgias.  Skin: Negative.   Allergic/Immunologic: Negative.   Neurological: Negative.   Hematological: Negative.   Psychiatric/Behavioral: Negative.        Objective:   Physical Exam  Constitutional: She is oriented to person, place, and time. She appears well-developed and well-nourished.  HENT:  Head: Normocephalic and atraumatic.  Eyes: Conjunctivae and EOM are normal. Pupils are equal, round, and reactive to light.  Neck: Normal range of motion. Neck supple.  Cardiovascular: Normal rate, regular rhythm and normal heart sounds.   Pulmonary/Chest: Effort normal and breath sounds normal.  There is no palpable mass in either breast. There is no palpable axillary or supraclavicular cervical lymphadenopathy.  Abdominal: Soft. Bowel sounds are normal. She exhibits no mass. There is no tenderness.  Musculoskeletal: Normal range of motion.  Lymphadenopathy:    She has no cervical adenopathy.  Neurological: She is alert and oriented to person, place, and time.  Skin: Skin is warm and dry.  Psychiatric: She has a normal mood and affect. Her behavior is  normal.       Assessment:     The patient is 6 years status post left breast lumpectomy in 13 years status post right lumpectomy and axillary node dissection for breast cancers     Plan:     At this point she will continue to do regular self exams. We will plan to see her back in one year. She will call if there is anything we can do for her or her husband

## 2013-01-17 NOTE — Patient Instructions (Signed)
Continue regular self exams  

## 2013-03-25 ENCOUNTER — Other Ambulatory Visit: Payer: Self-pay | Admitting: *Deleted

## 2013-03-25 MED ORDER — POTASSIUM CHLORIDE CRYS ER 20 MEQ PO TBCR: 20.0000 meq | EXTENDED_RELEASE_TABLET | Freq: Every day | ORAL | Status: DC

## 2013-04-06 ENCOUNTER — Other Ambulatory Visit: Payer: Self-pay | Admitting: *Deleted

## 2013-04-06 DIAGNOSIS — C50419 Malignant neoplasm of upper-outer quadrant of unspecified female breast: Secondary | ICD-10-CM

## 2013-04-06 MED ORDER — EXEMESTANE 25 MG PO TABS: 25.0000 mg | ORAL_TABLET | Freq: Every day | ORAL | Status: DC

## 2013-04-06 NOTE — Telephone Encounter (Signed)
Received refill request from pt for exemestane 25 mg - 1 tab po daily to be sent to [facility] in.

## 2013-04-26 ENCOUNTER — Telehealth: Payer: Self-pay | Admitting: Hematology & Oncology

## 2013-04-26 NOTE — Telephone Encounter (Signed)
Pt moved 7-23 to 7-2 she is having surgery 05-31-13

## 2013-05-18 ENCOUNTER — Encounter (HOSPITAL_COMMUNITY): Payer: Self-pay | Admitting: Pharmacy Technician

## 2013-05-19 ENCOUNTER — Other Ambulatory Visit: Payer: Self-pay | Admitting: Orthopaedic Surgery

## 2013-05-24 ENCOUNTER — Ambulatory Visit (HOSPITAL_COMMUNITY)
Admission: RE | Admit: 2013-05-24 | Discharge: 2013-05-24 | Disposition: A | Discharge status: Home / Self Care | Payer: Medicare Other | Source: Ambulatory Visit | Attending: Orthopaedic Surgery | Admitting: Orthopaedic Surgery

## 2013-05-24 ENCOUNTER — Encounter (HOSPITAL_COMMUNITY): Payer: Self-pay

## 2013-05-24 ENCOUNTER — Encounter (HOSPITAL_COMMUNITY)
Admission: RE | Admit: 2013-05-24 | Discharge: 2013-05-24 | Disposition: A | Discharge status: Home / Self Care | Payer: Medicare Other | Source: Ambulatory Visit | Attending: Orthopaedic Surgery | Admitting: Orthopaedic Surgery

## 2013-05-24 DIAGNOSIS — I517 Cardiomegaly: Secondary | ICD-10-CM | POA: Insufficient documentation

## 2013-05-24 DIAGNOSIS — E785 Hyperlipidemia, unspecified: Secondary | ICD-10-CM | POA: Insufficient documentation

## 2013-05-24 DIAGNOSIS — Z0183 Encounter for blood typing: Secondary | ICD-10-CM | POA: Insufficient documentation

## 2013-05-24 DIAGNOSIS — Z01810 Encounter for preprocedural cardiovascular examination: Secondary | ICD-10-CM | POA: Insufficient documentation

## 2013-05-24 DIAGNOSIS — I10 Essential (primary) hypertension: Secondary | ICD-10-CM | POA: Insufficient documentation

## 2013-05-24 DIAGNOSIS — E039 Hypothyroidism, unspecified: Secondary | ICD-10-CM | POA: Insufficient documentation

## 2013-05-24 DIAGNOSIS — Z01812 Encounter for preprocedural laboratory examination: Secondary | ICD-10-CM | POA: Insufficient documentation

## 2013-05-24 DIAGNOSIS — R9431 Abnormal electrocardiogram [ECG] [EKG]: Secondary | ICD-10-CM | POA: Insufficient documentation

## 2013-05-24 DIAGNOSIS — Z853 Personal history of malignant neoplasm of breast: Secondary | ICD-10-CM | POA: Insufficient documentation

## 2013-05-24 DIAGNOSIS — Z01818 Encounter for other preprocedural examination: Secondary | ICD-10-CM | POA: Insufficient documentation

## 2013-05-24 HISTORY — DX: Hypothyroidism, unspecified: E03.9

## 2013-05-24 HISTORY — DX: Shortness of breath: R06.02

## 2013-05-24 LAB — URINALYSIS, ROUTINE W REFLEX MICROSCOPIC
Glucose, UA: NEGATIVE mg/dL
Hgb urine dipstick: NEGATIVE
Specific Gravity, Urine: 1.015 (ref 1.005–1.030)
Urobilinogen, UA: 0.2 mg/dL (ref 0.0–1.0)
pH: 6.5 (ref 5.0–8.0)

## 2013-05-24 LAB — CBC WITH DIFFERENTIAL/PLATELET
Basophils Absolute: 0 10*3/uL (ref 0.0–0.1)
Basophils Relative: 0 % (ref 0–1)
Eosinophils Absolute: 0.1 10*3/uL (ref 0.0–0.7)
Eosinophils Relative: 2 % (ref 0–5)
HCT: 41.8 % (ref 36.0–46.0)
MCHC: 34.4 g/dL (ref 30.0–36.0)
MCV: 92.1 fL (ref 78.0–100.0)
Monocytes Absolute: 0.7 10*3/uL (ref 0.1–1.0)
RDW: 13.2 % (ref 11.5–15.5)

## 2013-05-24 LAB — SURGICAL PCR SCREEN
MRSA, PCR: NEGATIVE
Staphylococcus aureus: NEGATIVE

## 2013-05-24 LAB — BASIC METABOLIC PANEL
BUN: 10 mg/dL (ref 6–23)
CO2: 29 mEq/L (ref 19–32)
Calcium: 9.6 mg/dL (ref 8.4–10.5)
Chloride: 96 mEq/L (ref 96–112)
Creatinine, Ser: 0.78 mg/dL (ref 0.50–1.10)

## 2013-05-24 LAB — ABO/RH: ABO/RH(D): A POS

## 2013-05-24 NOTE — Pre-Procedure Instructions (Signed)
Hannah Galvan  05/24/2013   Your procedure is scheduled on:  05-31-2013  Tuesday   Report to [facility] Short Stay Center at 11:30  AM.  Take the [facility] to the 3rd floor and check in at the Short Stay Desk  Call this number if you have problems the morning of surgery: [phone]   Remember:   Do not eat food or drink liquids after midnight.    Take these medicines the morning of surgery with A SIP OF WATER:pain medication as needed,levothyroxine(Synthroid) aromasin   Do not wear jewelry, make-up or nail polish.  Do not wear lotions, powders, or perfumes.   Do not shave 48 hours prior to surgery..  Do not bring valuables to the hospital.  [facility] is not responsible for any belongings or valuables.  Contacts, dentures or bridgework may not be worn into surgery.  Leave suitcase in the car. After surgery it may be brought to your room.   For patients admitted to the hospital, checkout time is 11:00 AM the day of discharge.       Special Instructions: Shower using CHG 2 nights before surgery and the night before surgery.  If you shower the day of surgery use CHG.  Use special wash - you have one bottle of CHG for all showers.  You should use approximately 1/3 of the bottle for each shower.   Please read over the following fact sheets that you were given: Pain Booklet, Coughing and Deep Breathing, Blood Transfusion Information and Surgical Site Infection Prevention

## 2013-05-24 NOTE — Progress Notes (Signed)
Daughter-in-law requested EKG be done today because pt. Has been under a lot of stress.

## 2013-05-25 ENCOUNTER — Other Ambulatory Visit (HOSPITAL_BASED_OUTPATIENT_CLINIC_OR_DEPARTMENT_OTHER): Payer: Medicare Other | Admitting: Lab

## 2013-05-25 ENCOUNTER — Ambulatory Visit (HOSPITAL_BASED_OUTPATIENT_CLINIC_OR_DEPARTMENT_OTHER): Payer: Medicare Other | Admitting: Hematology & Oncology

## 2013-05-25 VITALS — BP 124/61 | HR 68 | Temp 97.8°F | Resp 16 | Ht 62.0 in | Wt 139.0 lb

## 2013-05-25 DIAGNOSIS — M818 Other osteoporosis without current pathological fracture: Secondary | ICD-10-CM

## 2013-05-25 DIAGNOSIS — E559 Vitamin D deficiency, unspecified: Secondary | ICD-10-CM

## 2013-05-25 DIAGNOSIS — C50919 Malignant neoplasm of unspecified site of unspecified female breast: Secondary | ICD-10-CM

## 2013-05-25 DIAGNOSIS — M810 Age-related osteoporosis without current pathological fracture: Secondary | ICD-10-CM

## 2013-05-25 DIAGNOSIS — C50911 Malignant neoplasm of unspecified site of right female breast: Secondary | ICD-10-CM

## 2013-05-25 LAB — CBC WITH DIFFERENTIAL (CANCER CENTER ONLY)
BASO%: 0.5 % (ref 0.0–2.0)
HCT: 41.3 % (ref 34.8–46.6)
HGB: 14 g/dL (ref 11.6–15.9)
LYMPH#: 2.1 10*3/uL (ref 0.9–3.3)
MONO#: 0.6 10*3/uL (ref 0.1–0.9)
NEUT%: 52.7 % (ref 39.6–80.0)
RDW: 12.9 % (ref 11.1–15.7)
WBC: 5.9 10*3/uL (ref 3.9–10.0)

## 2013-05-25 NOTE — H&P (Signed)
TOTAL KNEE ADMISSION H&P  Patient is being admitted for right total knee arthroplasty.  Subjective:  Chief Complaint:right knee pain.  HPI: Hannah Galvan, 79 y.o. female, has a history of pain and functional disability in the right knee due to arthritis and has failed non-surgical conservative treatments for greater than 12 weeks to includeNSAID's and/or analgesics, corticosteriod injections, flexibility and strengthening excercises, use of assistive devices, weight reduction as appropriate and activity modification.  Onset of symptoms was gradual, starting 4 years ago with gradually worsening course since that time. The patient noted no past surgery on the right knee(s).  Patient currently rates pain in the right knee(s) at 9 out of 10 with activity. Patient has night pain, worsening of pain with activity and weight bearing, pain that interferes with activities of daily living, pain with passive range of motion, crepitus and joint swelling.  Patient has evidence of subchondral sclerosis, periarticular osteophytes and joint space narrowing by imaging studies. This patient has had no. There is no active infection.  Patient Active Problem List   Diagnosis Date Noted  . UTI (lower urinary tract infection) 08/31/2012  . Heart murmur, aortic 07/15/2012  . Dizziness - giddy 07/15/2012  . Gait disturbance 12/21/2011  . Osteopenia 12/21/2011  . Cholelithiasis with cholecystitis without obstruction 12/21/2011  . Liver hemangioma 12/21/2011  . Pulmonary nodule, right 12/21/2011  . Breast cancer 12/03/2011  . Edema of both legs 09/15/2011  . Thyroid disease   . Hypertension   . Hyperlipidemia   . Arthritis   . Cancer    Past Medical History  Diagnosis Date  . Thyroid disease   . Hypertension   . Hyperlipidemia   . Arthritis   . Cancer     breast, s/p RXT, bilateral  . Hypothyroidism   . Shortness of breath     Past Surgical History  Procedure Laterality Date  . Abdominal hysterectomy   2008  . Thyroid surgery  2002  . Breast lumpectomy  2000  . Breast lumpectomy  2007  . Cataract extraction  2011  . Rotator cuff repair      right  . Knee surgery      right  . Bladder tact      No prescriptions prior to admission   Allergies  Allergen Reactions  . Pyridium (Phenazopyridine Hcl) Other (See Comments)    hemolysis  . Sulfa Antibiotics Rash    History  Substance Use Topics  . Smoking status: Never Smoker   . Smokeless tobacco: Never Used  . Alcohol Use: No    Family History  Problem Relation Age of Onset  . Hypertension Mother   . Stroke Mother   . Cancer Father   . Cancer Sister     leukemia  . Cancer Brother     leukemia  . Cancer Sister     vaginal  . Cancer Sister     bladder  . Cancer Brother     prostate, skin  . Cancer Brother     colon  . Cancer Brother     prostate, bone mets  . Cancer Brother     skin  . Heart disease Sister      Review of Systems  Musculoskeletal: Positive for joint pain.  All other systems reviewed and are negative.    Objective:  Physical Exam  Constitutional: She appears well-nourished.  HENT:  Head: Atraumatic.  Eyes: EOM are normal.  Neck: Neck supple.  Cardiovascular: Regular rhythm.   Respiratory: Breath sounds  normal.  GI: Bowel sounds are normal.  Musculoskeletal:  Right knee exam: Range of motion 5-1 10.  Mild valgus deformity.  Lateral joint line pain and crepitation.  No fluid.  Good neurovascular status distally.  Neurological: She is alert.  Skin: Skin is dry.  Psychiatric: She has a normal mood and affect.    Vital signs in last 24 hours: Temp:  [97.8 F (36.6 C)] 97.8 F (36.6 C) (07/02 1403) Pulse Rate:  [68] 68 (07/02 1403) Resp:  [16] 16 (07/02 1403) BP: (124)/(61) 124/61 mmHg (07/02 1403) Weight:  [63.05 kg (139 lb)] 63.05 kg (139 lb) (07/02 1403)  Labs:   Estimated body mass index is 23.57 kg/(m^2) as calculated from the following:   Height as of 01/17/13: 5\' 3"  (1.6  m).   Weight as of 01/17/13: 60.328 kg (133 lb).   Imaging Review Plain radiographs demonstrate severe degenerative joint disease of the right knee(s). The overall alignment ismild valgus. The bone quality appears to be good for age and reported activity level.  Assessment/Plan:  End stage arthritis, right knee   The patient history, physical examination, clinical judgment of the provider and imaging studies are consistent with end stage degenerative joint disease of the right knee(s) and total knee arthroplasty is deemed medically necessary. The treatment options including medical management, injection therapy arthroscopy and arthroplasty were discussed at length. The risks and benefits of total knee arthroplasty were presented and reviewed. The risks due to aseptic loosening, infection, stiffness, patella tracking problems, thromboembolic complications and other imponderables were discussed. The patient acknowledged the explanation, agreed to proceed with the plan and consent was signed. Patient is being admitted for inpatient treatment for surgery, pain control, PT, OT, prophylactic antibiotics, VTE prophylaxis, progressive ambulation and ADL's and discharge planning. The patient is planning to be discharged home with home health services

## 2013-05-25 NOTE — Progress Notes (Signed)
This office note has been dictated.

## 2013-05-25 NOTE — Progress Notes (Signed)
Anesthesia chart review:  Patient is a 79 year old female scheduled for right TKA on 06/02/13 by Dr. Jerl Santos.  History includes non-smoker, HTN, HLD, hypothyroidism, breast cancer s/p right breast cancer '00 and left DICS s/p lumpectomy '07, thyroid surgery (not specified).  HEM/ONC is Dr. Myna Hidalgo.  PCP is listed as Dr. Adrian Prince.  Cardiologist is Dr. Patty Sermons, last visit 12/06/12 (followed for palpitations/PACs, fatigue, etc without known history CAD or sustained arrhythmias).  Echo on 07/21/12 showed : - Left ventricle: The cavity size was normal. Wall thickness was normal. Systolic function was normal. The estimated ejection fraction was in the range of 60% to 65%. Wall motion was normal; there were no regional wall motion abnormalities. Doppler parameters are consistent with abnormal left ventricular relaxation (grade 1 diastolic dysfunction). - Aortic valve: Trivial regurgitation. - Mitral valve: There was mild systolic anterior motion of the chordal structures. Mild regurgitation. - Pulmonic valve: Mild regurgitation. - Tricuspid valve: Mild regurgitation. - Pulmonary arteries: Systolic pressure was mildly increased. PA peak pressure: 36mm Hg (S).  Holter monitor from 9-08/2012 showed SR, SR with PAC's, intermittent first degree AVB, one episode of 2.6 seconds of SVT.  EKG on 05/24/13 showed SR with PACs, possible LAE, LAD. PAC's were new since 05/07/07, otherwise EKG was not significantly changed.    Preoperative CXR and labs from 05/24/13 noted.    Her preoperative studies appear acceptable for OR.  She will be evaluated by her assigned anesthesiologist on the day of surgery.  If no acute changes then I would anticipate that she could proceed as planned.  Velna Ochs [facility] Short Stay Center/Anesthesiology Phone [phone] 05/25/2013 12:18 PM

## 2013-05-26 LAB — COMPREHENSIVE METABOLIC PANEL
ALT: 21 U/L (ref 0–35)
Albumin: 4.5 g/dL (ref 3.5–5.2)
CO2: 28 mEq/L (ref 19–32)
Calcium: 10.2 mg/dL (ref 8.4–10.5)
Chloride: 97 mEq/L (ref 96–112)
Creatinine, Ser: 0.92 mg/dL (ref 0.50–1.10)
Total Protein: 6.9 g/dL (ref 6.0–8.3)

## 2013-05-26 LAB — VITAMIN D 25 HYDROXY (VIT D DEFICIENCY, FRACTURES): Vit D, 25-Hydroxy: 74 ng/mL (ref 30–89)

## 2013-05-26 NOTE — Progress Notes (Signed)
CC:   Kendrick Ranch, M.D. Lubertha Basque Jerl Santos, M.D.  DIAGNOSES: 1. Ductal carcinoma in situ of the left breast. 2. History of stage IIA (T1 N1 M0) ductal carcinoma of the right     breast. 3. History of drug-induced hemolytic anemia.  CURRENT THERAPY:  Aromasin 25 mg p.o. daily for prophylaxis.  INTERIM HISTORY:  Ms. Zeitlin comes in for followup.  Her husband passed away back in 2023-02-05.  I took care of him also.  He died of heart failure. He had a lot of health issues.  Thankfully, his colon cancer and myeloma were not active at the time of his death.  Ms. Cirilo is going to have knee surgery on her right knee.  This will be on July 10th.  Dr. Jerl Santos will do this.  She, thankfully, has a lot of good family that has been helping her out.  She has had no issues with respect to the Aromasin.  She has had no bony issues outside of that with the right knee.  She has had no cough or shortness breath.  There has been no change in bowel or bladder habits.  PHYSICAL EXAMINATION:  General:  This is an elderly, petite white female in no obvious distress.  Vital signs:  Temperature of 97.8, pulse 68, respiratory rate 16, blood pressure 124/61.  Weight is 139.  Head and neck:  Normocephalic, atraumatic skull.  There are no ocular or oral lesions.  There are no palpable cervical or supraclavicular lymph nodes. Lungs:  Clear to percussion and auscultation bilaterally.  Cardiac: Regular rate and rhythm with a normal S1 and S2.  There are no murmurs, rubs or bruits.  Breasts:  Right breast with a well-healed lumpectomy at the 9 o'clock position.  No distinct mass is noted in the right breast. There was no right axillary adenopathy.  Left breast shows a well-healed lumpectomy at the 4 o'clock position.  She has some slight contraction of the left breast.  Again, no distinct mass is noted in the left breast.  There is no left axillary adenopathy.  Back:  Shows some slight kyphosis.  No  tenderness is noted over the spine, ribs or hips. Extremities:  Show some swelling of the right knee.  She has some limited range of motion of the right knee.  Otherwise, she has some osteoarthritic changes in her joints.  Neurological:  Shows no focal neurological deficits.  LABORATORY STUDIES:  White cell count 5.9, hemoglobin 14, hematocrit 41, platelet count 244.  IMPRESSION:  Ms. Sinning is a very nice 79 year old white female.  She has history of ductal carcinoma in situ of the left breast.  She was diagnosed with this back in 2007.  She had her right breast invasive carcinoma diagnosed back in 2000.  I have her on Aromasin as a prophylactic means for any additional breast problems.  I do not see any problems with her having surgery for the right knee.  I do not see that the Aromasin is going to cause her a problem.  Her blood counts look awfully good, so hopefully she will not need to be transfused.  I want see her back in 6 more months.    ______________________________ Josph Macho, M.D. PRE/MEDQ  D:  05/25/2013  T:  05/26/2013  Job:  [id number]

## 2013-06-01 MED ORDER — CEFAZOLIN SODIUM-DEXTROSE 2-3 GM-% IV SOLR
2.0000 g | INTRAVENOUS | Status: AC
  Administered 2013-06-02: 2 g via INTRAVENOUS
  Filled 2013-06-01: qty 50

## 2013-06-02 ENCOUNTER — Inpatient Hospital Stay (HOSPITAL_COMMUNITY): Payer: Medicare Other | Admitting: Anesthesiology

## 2013-06-02 ENCOUNTER — Encounter (HOSPITAL_COMMUNITY): Payer: Self-pay | Admitting: Certified Registered Nurse Anesthetist

## 2013-06-02 ENCOUNTER — Inpatient Hospital Stay (HOSPITAL_COMMUNITY)
Admission: RE | Admit: 2013-06-02 | Discharge: 2013-06-07 | DRG: 469 | Disposition: A | Discharge status: Home Health | Payer: Medicare Other | Source: Ambulatory Visit | Attending: Orthopaedic Surgery | Admitting: Orthopaedic Surgery

## 2013-06-02 ENCOUNTER — Encounter (HOSPITAL_COMMUNITY)
Admission: RE | Disposition: A | Discharge status: Home Health | Payer: Self-pay | Source: Ambulatory Visit | Attending: Orthopaedic Surgery

## 2013-06-02 ENCOUNTER — Encounter (HOSPITAL_COMMUNITY): Payer: Self-pay | Admitting: Vascular Surgery

## 2013-06-02 DIAGNOSIS — R339 Retention of urine, unspecified: Secondary | ICD-10-CM | POA: Diagnosis not present

## 2013-06-02 DIAGNOSIS — Z823 Family history of stroke: Secondary | ICD-10-CM

## 2013-06-02 DIAGNOSIS — Z923 Personal history of irradiation: Secondary | ICD-10-CM

## 2013-06-02 DIAGNOSIS — Z806 Family history of leukemia: Secondary | ICD-10-CM

## 2013-06-02 DIAGNOSIS — Z9849 Cataract extraction status, unspecified eye: Secondary | ICD-10-CM

## 2013-06-02 DIAGNOSIS — Z853 Personal history of malignant neoplasm of breast: Secondary | ICD-10-CM

## 2013-06-02 DIAGNOSIS — M1710 Unilateral primary osteoarthritis, unspecified knee: Principal | ICD-10-CM | POA: Diagnosis present

## 2013-06-02 DIAGNOSIS — Z8249 Family history of ischemic heart disease and other diseases of the circulatory system: Secondary | ICD-10-CM

## 2013-06-02 DIAGNOSIS — E039 Hypothyroidism, unspecified: Secondary | ICD-10-CM | POA: Diagnosis present

## 2013-06-02 DIAGNOSIS — Z808 Family history of malignant neoplasm of other organs or systems: Secondary | ICD-10-CM

## 2013-06-02 DIAGNOSIS — Z8052 Family history of malignant neoplasm of bladder: Secondary | ICD-10-CM

## 2013-06-02 DIAGNOSIS — Z8042 Family history of malignant neoplasm of prostate: Secondary | ICD-10-CM

## 2013-06-02 DIAGNOSIS — R110 Nausea: Secondary | ICD-10-CM | POA: Diagnosis not present

## 2013-06-02 DIAGNOSIS — Z882 Allergy status to sulfonamides status: Secondary | ICD-10-CM

## 2013-06-02 DIAGNOSIS — M1712 Unilateral primary osteoarthritis, left knee: Secondary | ICD-10-CM | POA: Diagnosis present

## 2013-06-02 DIAGNOSIS — I10 Essential (primary) hypertension: Secondary | ICD-10-CM | POA: Diagnosis present

## 2013-06-02 DIAGNOSIS — R509 Fever, unspecified: Secondary | ICD-10-CM | POA: Diagnosis present

## 2013-06-02 DIAGNOSIS — E871 Hypo-osmolality and hyponatremia: Secondary | ICD-10-CM | POA: Diagnosis not present

## 2013-06-02 DIAGNOSIS — E785 Hyperlipidemia, unspecified: Secondary | ICD-10-CM | POA: Diagnosis present

## 2013-06-02 DIAGNOSIS — Z79899 Other long term (current) drug therapy: Secondary | ICD-10-CM

## 2013-06-02 DIAGNOSIS — R338 Other retention of urine: Secondary | ICD-10-CM | POA: Diagnosis not present

## 2013-06-02 DIAGNOSIS — E876 Hypokalemia: Secondary | ICD-10-CM | POA: Diagnosis not present

## 2013-06-02 DIAGNOSIS — R410 Disorientation, unspecified: Secondary | ICD-10-CM | POA: Diagnosis not present

## 2013-06-02 DIAGNOSIS — L259 Unspecified contact dermatitis, unspecified cause: Secondary | ICD-10-CM | POA: Diagnosis not present

## 2013-06-02 DIAGNOSIS — Z7982 Long term (current) use of aspirin: Secondary | ICD-10-CM

## 2013-06-02 DIAGNOSIS — Z800 Family history of malignant neoplasm of digestive organs: Secondary | ICD-10-CM

## 2013-06-02 DIAGNOSIS — J9819 Other pulmonary collapse: Secondary | ICD-10-CM | POA: Diagnosis not present

## 2013-06-02 DIAGNOSIS — Z8049 Family history of malignant neoplasm of other genital organs: Secondary | ICD-10-CM

## 2013-06-02 DIAGNOSIS — G9341 Metabolic encephalopathy: Secondary | ICD-10-CM | POA: Diagnosis not present

## 2013-06-02 HISTORY — PX: TOTAL KNEE ARTHROPLASTY: SHX125

## 2013-06-02 SURGERY — ARTHROPLASTY, KNEE, TOTAL
Anesthesia: Choice | Site: Knee | Laterality: Right | Wound class: Clean

## 2013-06-02 MED ORDER — HYDROCODONE-ACETAMINOPHEN 5-325 MG PO TABS
1.0000 | ORAL_TABLET | ORAL | Status: DC | PRN
  Administered 2013-06-02 – 2013-06-03 (×2): 2 via ORAL
  Administered 2013-06-03: 1 via ORAL
  Administered 2013-06-03: 2 via ORAL
  Administered 2013-06-03 – 2013-06-06 (×3): 1 via ORAL
  Administered 2013-06-06 – 2013-06-07 (×2): 2 via ORAL
  Filled 2013-06-02 (×3): qty 2
  Filled 2013-06-02: qty 1
  Filled 2013-06-02 (×4): qty 2
  Filled 2013-06-02: qty 1

## 2013-06-02 MED ORDER — CALCIUM CARBONATE-VITAMIN D 500-200 MG-UNIT PO TABS
1.0000 | ORAL_TABLET | Freq: Two times a day (BID) | ORAL | Status: DC
  Administered 2013-06-02 – 2013-06-07 (×10): 1 via ORAL
  Filled 2013-06-02 (×11): qty 1

## 2013-06-02 MED ORDER — PHENOL 1.4 % MT LIQD
1.0000 | OROMUCOSAL | Status: DC | PRN
  Administered 2013-06-05: 1 via OROMUCOSAL
  Filled 2013-06-02: qty 177

## 2013-06-02 MED ORDER — ONDANSETRON HCL 4 MG PO TABS
4.0000 mg | ORAL_TABLET | Freq: Four times a day (QID) | ORAL | Status: DC | PRN
  Administered 2013-06-04: 4 mg via ORAL
  Filled 2013-06-02: qty 1

## 2013-06-02 MED ORDER — ESCITALOPRAM OXALATE 5 MG PO TABS
5.0000 mg | ORAL_TABLET | Freq: Every day | ORAL | Status: DC
  Administered 2013-06-02 – 2013-06-07 (×6): 5 mg via ORAL
  Filled 2013-06-02 (×6): qty 1

## 2013-06-02 MED ORDER — VITAMIN B-12 1000 MCG PO TABS
1000.0000 ug | ORAL_TABLET | Freq: Every day | ORAL | Status: DC
  Administered 2013-06-03 – 2013-06-07 (×5): 1000 ug via ORAL
  Filled 2013-06-02 (×5): qty 1

## 2013-06-02 MED ORDER — NEOSTIGMINE METHYLSULFATE 1 MG/ML IJ SOLN
INTRAMUSCULAR | Status: DC | PRN
  Administered 2013-06-02: 3 mg via INTRAVENOUS

## 2013-06-02 MED ORDER — ACETAMINOPHEN 650 MG RE SUPP: 650.0000 mg | Freq: Four times a day (QID) | RECTAL | Status: DC | PRN

## 2013-06-02 MED ORDER — ONDANSETRON HCL 4 MG/2ML IJ SOLN
4.0000 mg | Freq: Four times a day (QID) | INTRAMUSCULAR | Status: DC | PRN
  Administered 2013-06-02 – 2013-06-03 (×2): 4 mg via INTRAVENOUS
  Filled 2013-06-02 (×2): qty 2

## 2013-06-02 MED ORDER — HYDROMORPHONE HCL PF 1 MG/ML IJ SOLN
0.5000 mg | INTRAMUSCULAR | Status: DC | PRN
  Administered 2013-06-02 – 2013-06-03 (×4): 1 mg via INTRAVENOUS
  Filled 2013-06-02 (×2): qty 1

## 2013-06-02 MED ORDER — ALUM & MAG HYDROXIDE-SIMETH 200-200-20 MG/5ML PO SUSP: 30.0000 mL | ORAL | Status: DC | PRN

## 2013-06-02 MED ORDER — CHLORHEXIDINE GLUCONATE 4 % EX LIQD: 60.0000 mL | Freq: Once | CUTANEOUS | Status: DC

## 2013-06-02 MED ORDER — DOCUSATE SODIUM 100 MG PO CAPS
100.0000 mg | ORAL_CAPSULE | Freq: Two times a day (BID) | ORAL | Status: DC
  Administered 2013-06-02 – 2013-06-06 (×8): 100 mg via ORAL
  Filled 2013-06-02 (×11): qty 1

## 2013-06-02 MED ORDER — FENTANYL CITRATE 0.05 MG/ML IJ SOLN
INTRAMUSCULAR | Status: DC | PRN
  Administered 2013-06-02: 100 ug via INTRAVENOUS
  Administered 2013-06-02: 150 ug via INTRAVENOUS

## 2013-06-02 MED ORDER — ACETAMINOPHEN 325 MG PO TABS
650.0000 mg | ORAL_TABLET | Freq: Four times a day (QID) | ORAL | Status: DC | PRN
  Administered 2013-06-05: 650 mg via ORAL
  Filled 2013-06-02: qty 2

## 2013-06-02 MED ORDER — CEFAZOLIN SODIUM-DEXTROSE 2-3 GM-% IV SOLR
2.0000 g | Freq: Four times a day (QID) | INTRAVENOUS | Status: AC
  Administered 2013-06-02 – 2013-06-03 (×2): 2 g via INTRAVENOUS
  Filled 2013-06-02 (×2): qty 50

## 2013-06-02 MED ORDER — HYDROMORPHONE HCL PF 1 MG/ML IJ SOLN
0.2500 mg | INTRAMUSCULAR | Status: DC | PRN
  Administered 2013-06-02 (×2): 0.5 mg via INTRAVENOUS

## 2013-06-02 MED ORDER — FOLIC ACID 1 MG PO TABS
1.0000 mg | ORAL_TABLET | Freq: Every day | ORAL | Status: DC
  Administered 2013-06-02 – 2013-06-07 (×6): 1 mg via ORAL
  Filled 2013-06-02 (×6): qty 1

## 2013-06-02 MED ORDER — DIPHENHYDRAMINE HCL 12.5 MG/5ML PO ELIX
12.5000 mg | ORAL_SOLUTION | ORAL | Status: DC | PRN
  Administered 2013-06-03: 25 mg via ORAL
  Filled 2013-06-02: qty 10

## 2013-06-02 MED ORDER — HYDROMORPHONE HCL PF 1 MG/ML IJ SOLN: 0.5000 mg | INTRAMUSCULAR | Status: DC | PRN

## 2013-06-02 MED ORDER — HYDROMORPHONE HCL PF 1 MG/ML IJ SOLN
INTRAMUSCULAR | Status: AC
  Filled 2013-06-02: qty 1

## 2013-06-02 MED ORDER — METHOCARBAMOL 100 MG/ML IJ SOLN
500.0000 mg | Freq: Four times a day (QID) | INTRAVENOUS | Status: DC | PRN
  Filled 2013-06-02: qty 5

## 2013-06-02 MED ORDER — ASPIRIN EC 325 MG PO TBEC
325.0000 mg | DELAYED_RELEASE_TABLET | Freq: Two times a day (BID) | ORAL | Status: DC
  Administered 2013-06-03 – 2013-06-07 (×9): 325 mg via ORAL
  Filled 2013-06-02 (×12): qty 1

## 2013-06-02 MED ORDER — HYDROMORPHONE HCL PF 1 MG/ML IJ SOLN
INTRAMUSCULAR | Status: AC
  Administered 2013-06-02: 0.5 mg via INTRAVENOUS
  Filled 2013-06-02: qty 1

## 2013-06-02 MED ORDER — SODIUM CHLORIDE 0.9 % IR SOLN
Status: DC | PRN
  Administered 2013-06-02: 4000 mL

## 2013-06-02 MED ORDER — EXEMESTANE 25 MG PO TABS
25.0000 mg | ORAL_TABLET | Freq: Every day | ORAL | Status: DC
  Administered 2013-06-03 – 2013-06-07 (×5): 25 mg via ORAL
  Filled 2013-06-02 (×6): qty 1

## 2013-06-02 MED ORDER — LOSARTAN POTASSIUM-HCTZ 100-25 MG PO TABS: 0.5000 | ORAL_TABLET | Freq: Every day | ORAL | Status: DC

## 2013-06-02 MED ORDER — GLYCOPYRROLATE 0.2 MG/ML IJ SOLN
INTRAMUSCULAR | Status: DC | PRN
  Administered 2013-06-02: 0.4 mg via INTRAVENOUS

## 2013-06-02 MED ORDER — CALCIUM CARBONATE-VITAMIN D 600-200 MG-UNIT PO TABS: 1.0000 | ORAL_TABLET | Freq: Two times a day (BID) | ORAL | Status: DC

## 2013-06-02 MED ORDER — LACTATED RINGERS IV SOLN
INTRAVENOUS | Status: DC | PRN
  Administered 2013-06-02 (×2): via INTRAVENOUS

## 2013-06-02 MED ORDER — LACTATED RINGERS IV SOLN
INTRAVENOUS | Status: DC
  Administered 2013-06-02: 13:00:00 via INTRAVENOUS

## 2013-06-02 MED ORDER — POTASSIUM CHLORIDE CRYS ER 20 MEQ PO TBCR
20.0000 meq | EXTENDED_RELEASE_TABLET | Freq: Every day | ORAL | Status: DC
  Administered 2013-06-03 – 2013-06-04 (×2): 20 meq via ORAL
  Filled 2013-06-02 (×4): qty 1

## 2013-06-02 MED ORDER — LOSARTAN POTASSIUM 50 MG PO TABS
50.0000 mg | ORAL_TABLET | Freq: Every day | ORAL | Status: DC
  Administered 2013-06-02 – 2013-06-07 (×6): 50 mg via ORAL
  Filled 2013-06-02 (×6): qty 1

## 2013-06-02 MED ORDER — ONDANSETRON HCL 4 MG/2ML IJ SOLN
INTRAMUSCULAR | Status: DC | PRN
  Administered 2013-06-02: 4 mg via INTRAVENOUS

## 2013-06-02 MED ORDER — METOCLOPRAMIDE HCL 5 MG/ML IJ SOLN
5.0000 mg | Freq: Three times a day (TID) | INTRAMUSCULAR | Status: DC | PRN
  Administered 2013-06-04: 10 mg via INTRAVENOUS
  Filled 2013-06-02: qty 2

## 2013-06-02 MED ORDER — BISACODYL 5 MG PO TBEC: 5.0000 mg | DELAYED_RELEASE_TABLET | Freq: Every day | ORAL | Status: DC | PRN

## 2013-06-02 MED ORDER — PROPOFOL 10 MG/ML IV BOLUS
INTRAVENOUS | Status: DC | PRN
  Administered 2013-06-02: 150 mg via INTRAVENOUS

## 2013-06-02 MED ORDER — METHOCARBAMOL 500 MG PO TABS
500.0000 mg | ORAL_TABLET | Freq: Four times a day (QID) | ORAL | Status: DC | PRN
  Administered 2013-06-03 – 2013-06-07 (×6): 500 mg via ORAL
  Filled 2013-06-02 (×6): qty 1

## 2013-06-02 MED ORDER — ROCURONIUM BROMIDE 100 MG/10ML IV SOLN
INTRAVENOUS | Status: DC | PRN
  Administered 2013-06-02: 30 mg via INTRAVENOUS

## 2013-06-02 MED ORDER — FERROUS SULFATE 325 (65 FE) MG PO TABS
325.0000 mg | ORAL_TABLET | Freq: Every day | ORAL | Status: DC
  Administered 2013-06-03 – 2013-06-07 (×4): 325 mg via ORAL
  Filled 2013-06-02 (×6): qty 1

## 2013-06-02 MED ORDER — ATORVASTATIN CALCIUM 20 MG PO TABS
20.0000 mg | ORAL_TABLET | Freq: Every day | ORAL | Status: DC
  Administered 2013-06-02 – 2013-06-06 (×5): 20 mg via ORAL
  Filled 2013-06-02 (×6): qty 1

## 2013-06-02 MED ORDER — PHENYLEPHRINE HCL 10 MG/ML IJ SOLN
INTRAMUSCULAR | Status: DC | PRN
  Administered 2013-06-02 (×2): 80 ug via INTRAVENOUS
  Administered 2013-06-02: 40 ug via INTRAVENOUS
  Administered 2013-06-02: 80 ug via INTRAVENOUS

## 2013-06-02 MED ORDER — BUPIVACAINE LIPOSOME 1.3 % IJ SUSP
20.0000 mL | INTRAMUSCULAR | Status: DC
  Filled 2013-06-02: qty 20

## 2013-06-02 MED ORDER — LIDOCAINE HCL (CARDIAC) 20 MG/ML IV SOLN
INTRAVENOUS | Status: DC | PRN
  Administered 2013-06-02: 60 mg via INTRAVENOUS

## 2013-06-02 MED ORDER — LACTATED RINGERS IV SOLN: INTRAVENOUS | Status: DC

## 2013-06-02 MED ORDER — MENTHOL 3 MG MT LOZG: 1.0000 | LOZENGE | OROMUCOSAL | Status: DC | PRN

## 2013-06-02 MED ORDER — LORAZEPAM 1 MG PO TABS
1.0000 mg | ORAL_TABLET | Freq: Every day | ORAL | Status: DC
  Administered 2013-06-02 – 2013-06-06 (×5): 1 mg via ORAL
  Filled 2013-06-02 (×5): qty 1

## 2013-06-02 MED ORDER — VITAMIN B-12 5000 MCG SL SUBL: 1.0000 | SUBLINGUAL_TABLET | Freq: Every day | SUBLINGUAL | Status: DC

## 2013-06-02 MED ORDER — ZOLPIDEM TARTRATE 5 MG PO TABS: 5.0000 mg | ORAL_TABLET | Freq: Every evening | ORAL | Status: DC | PRN

## 2013-06-02 MED ORDER — LEVOTHYROXINE SODIUM 125 MCG PO TABS
125.0000 ug | ORAL_TABLET | Freq: Every day | ORAL | Status: DC
  Administered 2013-06-03 – 2013-06-07 (×5): 125 ug via ORAL
  Filled 2013-06-02 (×6): qty 1

## 2013-06-02 MED ORDER — HYDROCHLOROTHIAZIDE 12.5 MG PO CAPS
12.5000 mg | ORAL_CAPSULE | Freq: Every day | ORAL | Status: DC
  Administered 2013-06-02 – 2013-06-04 (×3): 12.5 mg via ORAL
  Filled 2013-06-02 (×4): qty 1

## 2013-06-02 MED ORDER — METOCLOPRAMIDE HCL 10 MG PO TABS: 5.0000 mg | ORAL_TABLET | Freq: Three times a day (TID) | ORAL | Status: DC | PRN

## 2013-06-02 MED ORDER — SODIUM CHLORIDE 0.9 % IJ SOLN
INTRAMUSCULAR | Status: DC | PRN
  Administered 2013-06-02: 14:00:00

## 2013-06-02 MED ORDER — SODIUM CHLORIDE 0.9 % IJ SOLN
INTRAMUSCULAR | Status: AC
  Filled 2013-06-02: qty 12

## 2013-06-02 SURGICAL SUPPLY — 62 items
BANDAGE ELASTIC 4 VELCRO ST LF (GAUZE/BANDAGES/DRESSINGS) ×2 IMPLANT
BANDAGE ELASTIC 6 VELCRO ST LF (GAUZE/BANDAGES/DRESSINGS) ×2 IMPLANT
BANDAGE ESMARK 6X9 LF (GAUZE/BANDAGES/DRESSINGS) ×1 IMPLANT
BANDAGE GAUZE ELAST BULKY 4 IN (GAUZE/BANDAGES/DRESSINGS) ×4 IMPLANT
BLADE SAGITTAL 25.0X1.19X90 (BLADE) ×2 IMPLANT
BLADE SURG ROTATE 9660 (MISCELLANEOUS) IMPLANT
BNDG ELASTIC 6X10 VLCR STRL LF (GAUZE/BANDAGES/DRESSINGS) ×2 IMPLANT
BNDG ESMARK 6X9 LF (GAUZE/BANDAGES/DRESSINGS) ×2
BOWL SMART MIX CTS (DISPOSABLE) ×2 IMPLANT
CAPT RP KNEE ×2 IMPLANT
CEMENT HV SMART SET (Cement) ×4 IMPLANT
CLOTH BEACON ORANGE TIMEOUT ST (SAFETY) ×2 IMPLANT
COVER SURGICAL LIGHT HANDLE (MISCELLANEOUS) ×2 IMPLANT
CUFF TOURNIQUET SINGLE 34IN LL (TOURNIQUET CUFF) ×2 IMPLANT
CUFF TOURNIQUET SINGLE 44IN (TOURNIQUET CUFF) IMPLANT
DRAPE EXTREMITY T 121X128X90 (DRAPE) ×2 IMPLANT
DRAPE PROXIMA HALF (DRAPES) ×2 IMPLANT
DRAPE U-SHAPE 47X51 STRL (DRAPES) ×2 IMPLANT
DRSG ADAPTIC 3X8 NADH LF (GAUZE/BANDAGES/DRESSINGS) ×2 IMPLANT
DRSG PAD ABDOMINAL 8X10 ST (GAUZE/BANDAGES/DRESSINGS) ×2 IMPLANT
DURAPREP 26ML APPLICATOR (WOUND CARE) ×2 IMPLANT
ELECT REM PT RETURN 9FT ADLT (ELECTROSURGICAL) ×2
ELECTRODE REM PT RTRN 9FT ADLT (ELECTROSURGICAL) ×1 IMPLANT
FACESHIELD LNG OPTICON STERILE (SAFETY) ×4 IMPLANT
GLOVE BIO SURGEON STRL SZ8.5 (GLOVE) ×2 IMPLANT
GLOVE BIOGEL PI IND STRL 8 (GLOVE) ×1 IMPLANT
GLOVE BIOGEL PI IND STRL 8.5 (GLOVE) ×1 IMPLANT
GLOVE BIOGEL PI INDICATOR 8 (GLOVE) ×1
GLOVE BIOGEL PI INDICATOR 8.5 (GLOVE) ×1
GLOVE SS BIOGEL STRL SZ 8 (GLOVE) ×1 IMPLANT
GLOVE SUPERSENSE BIOGEL SZ 8 (GLOVE) ×1
GOWN PREVENTION PLUS XLARGE (GOWN DISPOSABLE) ×2 IMPLANT
GOWN PREVENTION PLUS XXLARGE (GOWN DISPOSABLE) ×2 IMPLANT
GOWN STRL NON-REIN LRG LVL3 (GOWN DISPOSABLE) ×2 IMPLANT
HANDPIECE INTERPULSE COAX TIP (DISPOSABLE) ×1
HOOD PEEL AWAY FACE SHEILD DIS (HOOD) ×2 IMPLANT
IMMOBILIZER KNEE 20 (SOFTGOODS)
IMMOBILIZER KNEE 20 THIGH 36 (SOFTGOODS) IMPLANT
IMMOBILIZER KNEE 22 UNIV (SOFTGOODS) ×2 IMPLANT
IMMOBILIZER KNEE 24 THIGH 36 (MISCELLANEOUS) IMPLANT
IMMOBILIZER KNEE 24 UNIV (MISCELLANEOUS)
KIT BASIN OR (CUSTOM PROCEDURE TRAY) ×2 IMPLANT
KIT ROOM TURNOVER OR (KITS) ×2 IMPLANT
MANIFOLD NEPTUNE II (INSTRUMENTS) ×2 IMPLANT
NS IRRIG 1000ML POUR BTL (IV SOLUTION) ×2 IMPLANT
PACK TOTAL JOINT (CUSTOM PROCEDURE TRAY) ×2 IMPLANT
PAD ARMBOARD 7.5X6 YLW CONV (MISCELLANEOUS) ×4 IMPLANT
PADDING CAST COTTON 6X4 STRL (CAST SUPPLIES) ×2 IMPLANT
SET HNDPC FAN SPRY TIP SCT (DISPOSABLE) ×1 IMPLANT
SPONGE GAUZE 4X4 12PLY (GAUZE/BANDAGES/DRESSINGS) ×2 IMPLANT
STAPLER VISISTAT 35W (STAPLE) IMPLANT
SUCTION FRAZIER TIP 10 FR DISP (SUCTIONS) IMPLANT
SUT MNCRL AB 3-0 PS2 18 (SUTURE) IMPLANT
SUT VIC AB 0 CT1 27 (SUTURE) ×2
SUT VIC AB 0 CT1 27XBRD ANBCTR (SUTURE) ×2 IMPLANT
SUT VIC AB 2-0 CT1 27 (SUTURE) ×2
SUT VIC AB 2-0 CT1 TAPERPNT 27 (SUTURE) ×2 IMPLANT
SUT VLOC 180 0 24IN GS25 (SUTURE) ×2 IMPLANT
TOWEL OR 17X24 6PK STRL BLUE (TOWEL DISPOSABLE) ×2 IMPLANT
TOWEL OR 17X26 10 PK STRL BLUE (TOWEL DISPOSABLE) ×2 IMPLANT
TRAY FOLEY CATH 16FRSI W/METER (SET/KITS/TRAYS/PACK) ×2 IMPLANT
WATER STERILE IRR 1000ML POUR (IV SOLUTION) ×4 IMPLANT

## 2013-06-02 NOTE — Interval H&P Note (Signed)
History and Physical Interval Note:  06/02/2013 12:32 PM  Hannah Galvan  has presented today for surgery, with the diagnosis of RIGHT KNEE DEGENERATIVE JOINT DISEASE  The various methods of treatment have been discussed with the patient and family. After consideration of risks, benefits and other options for treatment, the patient has consented to  Procedure(s): RIGHT TOTAL KNEE ARTHROPLASTY (Right) as a surgical intervention .  The patient's history has been reviewed, patient examined, no change in status, stable for surgery.  I have reviewed the patient's chart and labs.  Questions were answered to the patient's satisfaction.     Ladiamond Gallina G

## 2013-06-02 NOTE — Progress Notes (Signed)
Discussed with Dr. Krista Blue pt's continued 10/10 pain level despite 2mg  IV Dilaudid.  Order received for additional Dilaudid.

## 2013-06-02 NOTE — Anesthesia Preprocedure Evaluation (Signed)
Anesthesia Evaluation  Patient identified by MRN, date of birth, ID band Patient awake    Reviewed: Allergy & Precautions, H&P , NPO status , Patient's Chart, lab work & pertinent test results  Airway Mallampati: I      Dental   Pulmonary shortness of breath,  breath sounds clear to auscultation        Cardiovascular hypertension, Pt. on medications Rhythm:Regular Rate:Normal     Neuro/Psych    GI/Hepatic negative GI ROS, Neg liver ROS,   Endo/Other  Hypothyroidism   Renal/GU negative Renal ROS     Musculoskeletal   Abdominal   Peds  Hematology   Anesthesia Other Findings   Reproductive/Obstetrics                           Anesthesia Physical Anesthesia Plan  ASA: III  Anesthesia Plan: General   Post-op Pain Management:    Induction: Intravenous  Airway Management Planned: Oral ETT  Additional Equipment:   Intra-op Plan:   Post-operative Plan: Extubation in OR  Informed Consent: I have reviewed the patients History and Physical, chart, labs and discussed the procedure including the risks, benefits and alternatives for the proposed anesthesia with the patient or authorized representative who has indicated his/her understanding and acceptance.   Dental advisory given  Plan Discussed with: Anesthesiologist and CRNA  Anesthesia Plan Comments:         Anesthesia Quick Evaluation

## 2013-06-02 NOTE — Transfer of Care (Signed)
Immediate Anesthesia Transfer of Care Note  Patient: Hannah Galvan  Procedure(s) Performed: Procedure(s): RIGHT TOTAL KNEE ARTHROPLASTY (Right)  Patient Location: PACU  Anesthesia Type:General  Level of Consciousness: awake, alert  and oriented  Airway & Oxygen Therapy: Patient Spontanous Breathing and Patient connected to nasal cannula oxygen  Post-op Assessment: Report given to PACU RN, Post -op Vital signs reviewed and stable and Patient moving all extremities  Post vital signs: Reviewed and stable  Complications: No apparent anesthesia complications

## 2013-06-02 NOTE — Anesthesia Postprocedure Evaluation (Signed)
Anesthesia Post Note  Patient: Hannah Galvan  Procedure(s) Performed: Procedure(s) (LRB): RIGHT TOTAL KNEE ARTHROPLASTY (Right)  Anesthesia type: general  Patient location: PACU  Post pain: Pain level controlled  Post assessment: Patient's Cardiovascular Status Stable  Last Vitals:  Filed Vitals:   06/02/13 1630  BP: 173/75  Pulse: 95  Temp:   Resp: 17    Post vital signs: Reviewed and stable  Level of consciousness: sedated  Complications: No apparent anesthesia complications

## 2013-06-02 NOTE — Anesthesia Procedure Notes (Addendum)
Narrative

## 2013-06-02 NOTE — Op Note (Signed)
PREOP DIAGNOSIS: DJD RIGHT KNEE POSTOP DIAGNOSIS: DJD RIGHT KNEE PROCEDURE: RIGHT TKR ANESTHESIA: General ATTENDING SURGEON: Krisha Beegle G ASSISTANT: Lindwood Qua PA and Patrick Jupiter RNFA  INDICATIONS FOR PROCEDURE: Hannah Galvan is a 79 y.o. female who has struggled for a long time with pain due to degenerative arthritis of the right knee.  The patient has failed many conservative non-operative measures and at this point has pain which limits the ability to sleep and walk.  The patient is offered total knee replacement.  Informed operative consent was obtained after discussion of possible risks of anesthesia, infection, neurovascular injury, DVT, and death.  The importance of the post-operative rehabilitation protocol to optimize result was stressed extensively with the patient.  SUMMARY OF FINDINGS AND PROCEDURE:  CAPTOLA TESCHNER was taken to the operative suite where under the above anesthesia a right knee replacement was performed.  There were advanced degenerative changes and the bone quality was good.  We used the DePuy system and placed size standard femur, 3 tibia, 35 mm all polyethylene patella, and a size 10 mm spacer.  The patient was admitted for appropriate post-op care to include perioperative antibiotics and mechanical and pharmacologic measures for DVT prophylaxis.  DESCRIPTION OF PROCEDURE:  Hannah Galvan was taken to the operative suite where the above anesthesia was applied.  The patient was positioned supine and prepped and draped in normal sterile fashion.  An appropriate time out was performed.  After the administration of Kefzol pre-op antibiotic the leg was elevated and exsanguinated and a tourniquet inflated. A standard longitudinal incision was made on the anterior knee.  Dissection was carried down to the extensor mechanism.  All appropriate anti-infective measures were used including the pre-operative antibiotic, betadine impregnated drape, and closed hooded exhaust  systems for each member of the surgical team.  A medial parapatellar incision was made in the extensor mechanism and the knee cap flipped and the knee flexed.  Some residual meniscal tissues were removed along with any remaining ACL/PCL tissue.  A guide was placed on the tibia and a flat cut was made on it's superior surface.  An intramedullary guide was placed in the femur and was utilized to make anterior and posterior cuts creating an appropriate flexion gap.  A second intramedullary guide was placed in the femur to make a distal cut properly balancing the knee with an extension gap equal to the flexion gap.  The three bones sized to the above mentioned sizes and the appropriate guides were placed and utilized.  A trial reduction was done and the knee easily came to full extension and the patella tracked well on flexion.  The trial components were removed and all bones were cleaned with pulsatile lavage and then dried thoroughly.  Cement was mixed and was pressurized onto the bones followed by placement of the aforementioned components.  Excess cement was trimmed and pressure was held on the components until the cement had hardened.  The tourniquet was deflated and a small amount of bleeding was controlled with cautery and pressure.  The knee was irrigated thoroughly.  The extensor mechanism was re-approximated with V-loc suture in running fashion.  The knee was flexed and the repair was solid.  We injected with Exparel during closure and placed this into the capsule and subQ and the joint itself. The subcutaneous tissues were re-approximated with #0 and #2-0 vicryl and the skin closed with a subcuticular stitch and steristrips.  A sterile dressing was applied.  Intraoperative fluids, EBL, and  tourniquet time can be obtained from anesthesia records.  DISPOSITION:  The patient was taken to recovery room in stable condition and admitted for appropriate post-op care to include peri-operative antibiotic and DVT  prophylaxis with mechanical and pharmacologic measures.  Hannah Galvan G 06/02/2013, 3:04 PM

## 2013-06-02 NOTE — Progress Notes (Signed)
Orthopedic Tech Progress Note Patient Details:  Hannah Galvan 04-16-1931 [id number]  CPM Right Knee CPM Right Knee: On Right Knee Flexion (Degrees): 60 Right Knee Extension (Degrees): 0 Additional Comments: applied overhead frame to bed   Jennye Moccasin 06/02/2013, 5:22 PM

## 2013-06-03 ENCOUNTER — Encounter (HOSPITAL_COMMUNITY): Payer: Self-pay | Admitting: General Practice

## 2013-06-03 LAB — CBC
MCH: 31.9 pg (ref 26.0–34.0)
Platelets: 164 10*3/uL (ref 150–400)
RBC: 3.76 MIL/uL — ABNORMAL LOW (ref 3.87–5.11)
WBC: 9 10*3/uL (ref 4.0–10.5)

## 2013-06-03 LAB — BASIC METABOLIC PANEL
CO2: 28 mEq/L (ref 19–32)
Calcium: 8.2 mg/dL — ABNORMAL LOW (ref 8.4–10.5)
Potassium: 3.3 mEq/L — ABNORMAL LOW (ref 3.5–5.1)
Sodium: 135 mEq/L (ref 135–145)

## 2013-06-03 NOTE — Progress Notes (Signed)
Occupational Therapy Evaluation Patient Details Name: Hannah Galvan MRN: [id number] DOB: 11-20-1931 Today's Date: 06/03/2013 Time: 1191-4782 OT Time Calculation (min): 27 min  OT Assessment / Plan / Recommendation History of present illness Pt. admitted with R knee DJD  and underwent R TKA   Clinical Impression   Pt limited by pain/weakness this pm. Plans to go to son's house to recooperate before going to her own home. Pt will benefit from skilled OT services to facilitate D/C to next venue due to below deficits. Will educate pt on AE and DME for ADL.    OT Assessment  Patient needs continued OT Services    Follow Up Recommendations  Home health OT    Barriers to Discharge      Equipment Recommendations  3 in 1 bedside comode;Tub/shower bench    Recommendations for Other Services    Frequency  Min 2X/week    Precautions / Restrictions Precautions Precautions: Knee Precaution Booklet Issued: Yes (comment) Precaution Comments: provided pt. with handout of TKA exercises and was insturcted to complete anklle pumps and  quad sets Required Braces or Orthoses: Knee Immobilizer - Right (orders for KI to be DC'd in chart) Knee Immobilizer - Right: Other (comment) (discontinued) Restrictions Weight Bearing Restrictions: Yes RLE Weight Bearing: Weight bearing as tolerated   Pertinent Vitals/Pain 5. R knee when moving    ADL  Grooming: Set up Where Assessed - Grooming: Unsupported sitting Upper Body Bathing: Set up Where Assessed - Upper Body Bathing: Unsupported sitting Lower Body Bathing: Maximal assistance Where Assessed - Lower Body Bathing: Supported sit to stand Upper Body Dressing: Set up Where Assessed - Upper Body Dressing: Unsupported sitting Lower Body Dressing: Maximal assistance Where Assessed - Lower Body Dressing: Supported sit to Pharmacist, hospital: Maximal assistance Toilet Transfer Method: Sit to Barista: Bedside commode Toileting  - Clothing Manipulation and Hygiene: Maximal assistance Where Assessed - Engineer, mining and Hygiene: Sit to stand from 3-in-1 or toilet Equipment Used: Gait belt;Rolling walker Transfers/Ambulation Related to ADLs: Max A ADL Comments: limited by pain and weakness    OT Diagnosis: Generalized weakness;Acute pain  OT Problem List: Decreased strength;Decreased activity tolerance;Decreased knowledge of use of DME or AE;Decreased knowledge of precautions;Pain OT Treatment Interventions: Self-care/ADL training;DME and/or AE instruction;Therapeutic activities;Patient/family education   OT Goals(Current goals can be found in the care plan section) Acute Rehab OT Goals Patient Stated Goal: to go to son's for recovery and rehab OT Goal Formulation: With patient Time For Goal Achievement: 06/17/13 Potential to Achieve Goals: Good ADL Goals Pt/caregiver will Perform Home Exercise Program: For increased ROM;With Supervision, verbal cues required/provided  Visit Information  Last OT Received On: 06/03/13 Assistance Needed: +2 History of Present Illness: Pt. admitted with R knee DJD  and underwent R TKA       Prior Functioning     Home Living Family/patient expects to be discharged to:: Private residence Living Arrangements: Alone Available Help at Discharge: Family;Available 24 hours/day (going to son's at DC) Type of Home: House Home Access: Stairs to enter [employer] of Steps: 4 Entrance Stairs-Rails: Right;Left;Can reach both Home Layout: One level Home Equipment: Walker - 2 wheels;Bedside commode;Shower seat;Walker - 4 wheels;Grab bars - tub/shower Prior Function Level of Independence: Independent Communication Communication: No difficulties         Vision/Perception Vision - Assessment Eye Alignment: Within Functional Limits   Cognition  Cognition Arousal/Alertness: Awake/alert Behavior During Therapy: WFL for tasks  assessed/performed Overall Cognitive Status: Within Functional  Limits for tasks assessed    Extremity/Trunk Assessment Upper Extremity Assessment Upper Extremity Assessment: Overall WFL for tasks assessed Lower Extremity Assessment Lower Extremity Assessment: RLE deficits/detail (L LE WFL for tasks) RLE Deficits / Details: weak quad set; good ankle pump RLE: Unable to fully assess due to pain RLE Sensation: decreased light touch     Mobility Bed Mobility Bed Mobility: Supine to Sit;Sitting - Scoot to Edge of Bed Supine to Sit: 3: Mod assist;HOB elevated;With rails Supine to Sit: Patient Percentage: 60% Sitting - Scoot to Edge of Bed: 3: Mod assist Details for Bed Mobility Assistance: cues for technique and hand placement Transfers Sit to Stand: With upper extremity assist;From bed;2: Max assist Sit to Stand: Patient Percentage: 50% Stand to Sit: To chair/3-in-1;With armrests;2: Max assist Stand to Sit: Patient Percentage: 50% Details for Transfer Assistance: manual assist to move RW     Exercise Total Joint Exercises Ankle Circles/Pumps: AROM;Both;15 reps Quad Sets: AROM;Right;10 reps Goniometric ROM: -10 to 60   Balance     End of Session OT - End of Session Equipment Utilized During Treatment: Gait belt;Rolling walker Activity Tolerance: Patient tolerated treatment well Patient left: Other (comment) (on [facility] - nursing aware)  GO     Jerran Tappan,HILLARY 06/03/2013, 5:20 PM [facility], OTR/L  [phone] 06/03/2013

## 2013-06-03 NOTE — Progress Notes (Signed)
Occupational Therapy Treatment Patient Details Name: IZELA ALTIER MRN: [id number] DOB: March 11, 1931 Today's Date: 06/03/2013 Time: 5621-3086 OT Time Calculation (min): 40 min  OT Assessment / Plan / Recommendation  OT comments  Pt seen a second session to assist off the [facility] and to chair. Pt ambulated @ 6 feet with max A and manual assist to advance RW. Max A to control sit/descent into chair. Long discussion with family regarding D/C plans. Stated if pt does not progress as needed, that she may need SNF for rehab prior to return home. Son states that she needs to be able to take herself to the bathroom, for D/C home. Pt is in agreement to SNF if absolutely necessary. Will continue to follow with goal to return home with HHOT, but adjust D/C plan to SNF if needed.   Follow Up Recommendations  Home health OT    Barriers to Discharge       Equipment Recommendations  3 in 1 bedside comode;Tub/shower bench    Recommendations for Other Services    Frequency Min 2X/week   Progress towards OT Goals Progress towards OT goals: Progressing toward goals  Plan Discharge plan remains appropriate    Precautions / Restrictions Precautions Precautions: Knee Precaution Booklet Issued: Yes (comment) Precaution Comments: provided pt. with handout of TKA exercises and was insturcted to complete anklle pumps and  quad sets Required Braces or Orthoses: Knee Immobilizer - Right (orders for KI to be DC'd in chart) Knee Immobilizer - Right: Other (comment) (discontinued) Restrictions Weight Bearing Restrictions: Yes RLE Weight Bearing: Weight bearing as tolerated   Pertinent Vitals/Pain Pain 5 R knee. nsg aware. Ice to R knee    ADL  Grooming: Set up Where Assessed - Grooming: Unsupported sitting Upper Body Bathing: Set up Where Assessed - Upper Body Bathing: Unsupported sitting Lower Body Bathing: Maximal assistance Where Assessed - Lower Body Bathing: Supported sit to stand Upper Body Dressing: Set  up Where Assessed - Upper Body Dressing: Unsupported sitting Lower Body Dressing: Maximal assistance Where Assessed - Lower Body Dressing: Supported sit to stand Toilet Transfer: Maximal assistance Toilet Transfer Method: Sit to stand;Other (comment) (ambulating) Toilet Transfer Equipment: Bedside commode Toileting - Clothing Manipulation and Hygiene: Maximal assistance Where Assessed - Toileting Clothing Manipulation and Hygiene: Sit to stand from 3-in-1 or toilet Equipment Used: Gait belt;Rolling walker Transfers/Ambulation Related to ADLs: Max A ADL Comments: Required max A to descend to chair also. anxious about falling. Able to take 6 steps with max cues and manual assist to advance walker    OT Diagnosis: Generalized weakness;Acute pain  OT Problem List: Decreased strength;Decreased range of motion;Decreased activity tolerance;Decreased knowledge of use of DME or AE;Pain OT Treatment Interventions: Self-care/ADL training;DME and/or AE instruction;Therapeutic activities;Patient/family education   OT Goals(current goals can now be found in the care plan section) Acute Rehab OT Goals Patient Stated Goal: to go to son's for recovery and rehab OT Goal Formulation: With patient Time For Goal Achievement: 06/17/13 Potential to Achieve Goals: Good ADL Goals Pt Will Perform Lower Body Bathing: with adaptive equipment;with min guard assist;sit to/from stand Pt Will Perform Lower Body Dressing: with min guard assist;with adaptive equipment;sit to/from stand Pt Will Transfer to Toilet: with min guard assist;ambulating;bedside commode Pt Will Perform Toileting - Clothing Manipulation and hygiene: with supervision;with caregiver independent in assisting;sitting/lateral leans Pt Will Perform Tub/Shower Transfer: with min assist;with caregiver independent in assisting;ambulating;tub bench Pt/caregiver will Perform Home Exercise Program: For increased ROM;With Supervision, verbal cues  required/provided  Visit Information  Last OT Received On: 06/03/13 Assistance Needed: +2 (for ambulation) History of Present Illness: Pt. admitted with R knee DJD  and underwent R TKA    Subjective Data      Prior Functioning  Home Living Family/patient expects to be discharged to:: Private residence Living Arrangements: Alone Available Help at Discharge: Family;Available 24 hours/day (going to son's at DC) Type of Home: House Home Access: Stairs to enter [employer] of Steps: 4 Entrance Stairs-Rails: Right;Left;Can reach both Home Layout: One level Home Equipment: Walker - 2 wheels;Bedside commode;Shower seat;Walker - 4 wheels;Grab bars - tub/shower Prior Function Level of Independence: Independent Communication Communication: No difficulties    Cognition  Cognition Arousal/Alertness: Awake/alert Behavior During Therapy: WFL for tasks assessed/performed Overall Cognitive Status: Within Functional Limits for tasks assessed    Mobility  Bed Mobility Bed Mobility: Supine to Sit;Sitting - Scoot to Edge of Bed Supine to Sit: 3: Mod assist Supine to Sit: Patient Percentage: 60% Sitting - Scoot to Edge of Bed: 3: Mod assist Details for Bed Mobility Assistance: cues for technique and hand placement Transfers Sit to Stand: 2: Max assist;With upper extremity assist;From chair/3-in-1 Sit to Stand: Patient Percentage: 50% Stand to Sit: 2: Max assist;To chair/3-in-1 Stand to Sit: Patient Percentage: 50% Details for Transfer Assistance: max cues for hand placement and use of walker    Exercises  Total Joint Exercises Ankle Circles/Pumps: AROM;Both;15 reps Quad Sets: AROM;Right;10 reps Goniometric ROM: -10 to 60 Other Exercises Other Exercises: Educated pt's son on imiportance of pt performing knee exercises on her own   Balance Balance Balance Assessed:  (mod A)   End of Session OT - End of Session Equipment Utilized During Treatment: Gait belt;Rolling  walker Activity Tolerance: Patient tolerated treatment well Patient left: in chair;with call bell/phone within reach;with family/visitor present Nurse Communication: Mobility status  GO     Keron Neenan,HILLARY 06/03/2013, 6:07 PM [facility], OTR/L  [phone] 06/03/2013

## 2013-06-03 NOTE — Progress Notes (Signed)
UR COMPLETED  

## 2013-06-03 NOTE — Progress Notes (Signed)
Subjective: 1 Day Post-Op Procedure(s) (LRB): RIGHT TOTAL KNEE ARTHROPLASTY (Right)  Activity level:  wbat Diet tolerance:  reg Voiding:  ok Patient reports pain as 3 on 0-10 scale.    Objective: Vital signs in last 24 hours: Temp:  [97.5 F (36.4 C)-98.7 F (37.1 C)] 98.7 F (37.1 C) (07/11 0451) Pulse Rate:  [84-105] 87 (07/11 0451) Resp:  [11-27] 14 (07/11 0451) BP: (135-173)/(55-102) 135/55 mmHg (07/11 0451) SpO2:  [91 %-100 %] 96 % (07/11 0451)  Labs:  Recent Labs  06/03/13 0550  HGB 12.0    Recent Labs  06/03/13 0550  WBC 9.0  RBC 3.76*  HCT 34.6*  PLT 164    Recent Labs  06/03/13 0550  NA 135  K 3.3*  CL 98  CO2 28  BUN 10  CREATININE 0.66  GLUCOSE 140*  CALCIUM 8.2*   No results found for this basename: LABPT, INR,  in the last 72 hours  Physical Exam:  Neurologically intact ABD soft Neurovascular intact Sensation intact distally Intact pulses distally Dorsiflexion/Plantar flexion intact Incision: dressing C/D/I No cellulitis present Compartment soft  Assessment/Plan:  1 Day Post-Op Procedure(s) (LRB): RIGHT TOTAL KNEE ARTHROPLASTY (Right) Advance diet Up with therapy D/C IV fluids We'll change dressing later today. Continue ASA 325 twice a day for 2 weeks/SCDs Will be going home with his son on discharge.    Hannah Galvan R 06/03/2013, 8:26 AM

## 2013-06-03 NOTE — Care Management Note (Addendum)
    Page 1 of 2   06/07/2013     2:07:02 PM   CARE MANAGEMENT NOTE 06/07/2013  Patient:  Hannah Galvan, Hannah Galvan   Account Number:  [id number]  Date Initiated:  06/03/2013  Documentation initiated by:  [facility]  Subjective/Objective Assessment:   admitted postop rt total knee replacement     Action/Plan:   return home with HHPT and HHOT   Anticipated DC Date:  06/07/2013   Anticipated DC Plan:  HOME W HOME HEALTH SERVICES      DC Planning Services  CM consult      Choice offered to / List presented to:     DME arranged  3-N-1  WALKER - ROLLING  CPM      DME agency  [employer]     HH arranged  HH-2 PT  HH-3 OT      HH agency  [facility]   Status of service:  Completed, signed off Medicare Important Message given?   (If response is "NO", the following Medicare IM given date fields will be blank) Date Medicare IM given:   Date Additional Medicare IM given:    Discharge Disposition:  HOME W HOME HEALTH SERVICES  Per UR Regulation:    If discussed at Long Length of Stay Meetings, dates discussed:    Comments:  06/07/13 OT recommended HHOT. Contacted Donisha Hoch with [facility] and set up HHOT. Jacquelynn Cree RN, BSN, CCM   06/03/13 Patient set up with HHPT with [facility] by [place] office. Spoke with patient, no change in d/c plan. She will be staying with her husband and his wife upon d/c. Rolling walker delivered to patient's room, 3N1 and CPM to be delivered to patient's home by [employer]. Jacquelynn Cree RN, BSN, CCM

## 2013-06-03 NOTE — Evaluation (Signed)
Physical Therapy Evaluation Patient Details Name: Hannah Galvan MRN: [id number] DOB: 1931-04-19 Today's Date: 06/03/2013 Time: 7829-5621 PT Time Calculation (min): 26 min  PT Assessment / Plan / Recommendation History of Present Illness  Pt. admitted with R knee DJD  and underwent R TKA  Clinical Impression  This patient underwent a right TKA and presents to PT with anticipated post-op decrease in strength and ROM, decreased functional mobility and gait.  Pt. Will benefit from acute PT to address these and below issues.  She plans to go to son's home for rehab and recovery but will need to see adequate progress for this to be feasible.      PT Assessment  Patient needs continued PT services    Follow Up Recommendations  Home health PT;Supervision/Assistance - 24 hour;Supervision for mobility/OOB    Does the patient have the potential to tolerate intense rehabilitation      Barriers to Discharge Other (comment) pt. lives along but plans to go to son' shome at [place]    Equipment Recommendations  None recommended by PT    Recommendations for Other Services     Frequency 7X/week    Precautions / Restrictions Precautions Precautions: Knee Precaution Booklet Issued: Yes (comment) Precaution Comments: provided pt. with handout of TKA exercises and was insturcted to complete anklle pumps and  quad sets Required Braces or Orthoses: Knee Immobilizer - Right (orders for KI to be DC'd in chart) Knee Immobilizer - Right: Other (comment) (discontinued) Restrictions Weight Bearing Restrictions: Yes RLE Weight Bearing: Weight bearing as tolerated   Pertinent Vitals/Pain .see       Mobility  Bed Mobility Bed Mobility: Supine to Sit;Sitting - Scoot to Edge of Bed Supine to Sit: 1: +2 Total assist;HOB flat;With rails Supine to Sit: Patient Percentage: 50% Sitting - Scoot to Edge of Bed: 3: Mod assist Details for Bed Mobility Assistance: cues for technique and hand  placement Transfers Transfers: Sit to Stand;Stand to Sit Sit to Stand: 1: +2 Total assist;With upper extremity assist;From bed Sit to Stand: Patient Percentage: 50% Stand to Sit: 1: +2 Total assist;To chair/3-in-1;With armrests Stand to Sit: Patient Percentage: 50% Details for Transfer Assistance: needed assist to rise to stand and to control descent, cues for hand placement and R LE placment Ambulation/Gait Ambulation/Gait Assistance: 1: +2 Total assist Ambulation/Gait: Patient Percentage: 60% Ambulation Distance (Feet): 3 Feet Assistive device: Rolling walker Ambulation/Gait Assistance Details: Pt. with difficulty weight shifting to unweight left LE for stepping, needing manual facilitation at hips to shift.  Also needed assist to advance RW Gait Pattern: Step-to pattern;Decreased step length - right;Decreased step length - left;Decreased stance time - right    Exercises Total Joint Exercises Ankle Circles/Pumps: AROM;Both;15 reps Quad Sets: AROM;Right;10 reps Goniometric ROM: -10 to 60   PT Diagnosis: Difficulty walking;Abnormality of gait;Acute pain  PT Problem List: Decreased strength;Decreased range of motion;Decreased activity tolerance;Decreased mobility;Decreased knowledge of use of DME;Decreased knowledge of precautions;Pain PT Treatment Interventions: DME instruction;Gait training;Stair training;Functional mobility training;Therapeutic activities;Therapeutic exercise;Patient/family education     PT Goals(Current goals can be found in the care plan section) Acute Rehab PT Goals Patient Stated Goal: to go to son's for recovery and rehab PT Goal Formulation: With patient Time For Goal Achievement: 06/10/13 Potential to Achieve Goals: Good  Visit Information  Last PT Received On: 06/03/13 Assistance Needed: +2 History of Present Illness: Pt. admitted with R knee DJD  and underwent R TKA       Prior Functioning  Home Living Family/patient expects  to be discharged to::  Private residence Living Arrangements: Alone Available Help at Discharge: Family;Available 24 hours/day (going to son's at [place]) Type of Home: House Home Access: Stairs to enter [employer] of Steps: 4 Entrance Stairs-Rails: Right;Left;Can reach both Home Layout: One level Home Equipment: Walker - 2 wheels;Bedside commode;Shower seat Prior Function Level of Independence: Independent Communication Communication: No difficulties    Cognition  Cognition Arousal/Alertness: Awake/alert Behavior During Therapy: WFL for tasks assessed/performed Overall Cognitive Status: Within Functional Limits for tasks assessed    Extremity/Trunk Assessment Upper Extremity Assessment Upper Extremity Assessment: Overall WFL for tasks assessed Lower Extremity Assessment Lower Extremity Assessment: RLE deficits/detail (L LE WFL for tasks) RLE Deficits / Details: weak quad set; good ankle pump RLE: Unable to fully assess due to pain RLE Sensation: decreased light touch   Balance    End of Session PT - End of Session Equipment Utilized During Treatment: Gait belt Activity Tolerance: Patient limited by pain Patient left: in chair;with call bell/phone within reach;with family/visitor present Nurse Communication: Mobility status;Weight bearing status CPM Right Knee CPM Right Knee: Off Right Knee Flexion (Degrees): 60 Right Knee Extension (Degrees): 0  GP     Ferman Hamming 06/03/2013, 3:38 PM Weldon Picking PT Acute Rehab Services [phone] Beeper [phone]

## 2013-06-03 NOTE — Progress Notes (Signed)
Physical Therapy Treatment Patient Details Name: Hannah Galvan MRN: [id number] DOB: 05-16-1931 Today's Date: 06/03/2013 Time: 4098-1191 PT Time Calculation (min): 23 min  PT Assessment / Plan / Recommendation  PT Comments   Pt. Wanted to attempt to urinate on 3n1 as she had been able to do so since catheter removal.  She was not able to urinate but practiced her bed mobility and transfers again in her attempt.  Still needs 2 assist for mobility.  Follow Up Recommendations  Home health PT;Supervision/Assistance - 24 hour;Supervision for mobility/OOB     Does the patient have the potential to tolerate intense rehabilitation     Barriers to Discharge Other (comment) pt. lives alone but plans to go to son' home at [place]    Equipment Recommendations  None recommended by PT    Recommendations for Other Services    Frequency 7X/week   Progress towards PT Goals Progress towards PT goals: Progressing toward goals  Plan Current plan remains appropriate    Precautions / Restrictions  Required Braces or Orthoses: Knee Immobilizer - Right (orders for KI to be DC'd in chart) Knee Immobilizer - Right: Other (comment) (discontinued) Restrictions Weight Bearing Restrictions: Yes RLE Weight Bearing: Weight bearing as tolerated   Pertinent Vitals/Pain See vitals tab     Mobility  Bed Mobility Bed Mobility: Supine to Sit;Sitting - Scoot to Edge of Bed Supine to Sit: 1: +2 Total assist;HOB flat;With rails Supine to Sit: Patient Percentage: 60% Sitting - Scoot to Edge of Bed: 4: Min assist Details for Bed Mobility Assistance: cues for technique and hand placement Transfers Transfers: Sit to Stand;Stand to Sit Sit to Stand: 1: +2 Total assist;With upper extremity assist;From bed;From chair/3-in-1 Sit to Stand: Patient Percentage: 50% Stand to Sit: 1: +2 Total assist;To chair/3-in-1;To bed Stand to Sit: Patient Percentage: 50% Details for Transfer Assistance: manual assist to rise to stand and  for controlled descent Ambulation/Gait Ambulation/Gait Assistance: 1: +2 Total assist Ambulation/Gait: Patient Percentage: 60% Ambulation Distance (Feet): 3 Feet Assistive device: Rolling walker Ambulation/Gait Assistance Details: pt. made 3 pivoting steps to 3n1 to attmept to urinate then back to bed.  She was unsuccessful in her attempt to urinate and RN Sarah aware Gait Pattern: Step-to pattern;Decreased step length - right;Decreased step length - left;Decreased stance time - right    Exercises    PT Diagnosis: Difficulty walking;Abnormality of gait;Acute pain  PT Problem List: Decreased strength;Decreased range of motion;Decreased activity tolerance;Decreased mobility;Decreased knowledge of use of DME;Decreased knowledge of precautions;Pain PT Treatment Interventions: DME instruction;Gait training;Stair training;Functional mobility training;Therapeutic activities;Therapeutic exercise;Patient/family education   PT Goals (current goals can now be found in the care plan section) Acute Rehab PT Goals Patient Stated Goal: to go to son's for recovery and rehab PT Goal Formulation: With patient Time For Goal Achievement: 06/10/13 Potential to Achieve Goals: Good  Visit Information  Last PT Received On: 06/03/13 Assistance Needed: +2 History of Present Illness: Pt. admitted with R knee DJD  and underwent R TKA    Subjective Data  Subjective: "i want to try to pee; I feel like I need to " Patient Stated Goal: to go to son's for recovery and rehab   Cognition  Cognition Arousal/Alertness: Awake/alert Behavior During Therapy: WFL for tasks assessed/performed Overall Cognitive Status: Within Functional Limits for tasks assessed    Balance     End of Session PT - End of Session Equipment Utilized During Treatment: Gait belt Activity Tolerance: Patient limited by pain Patient left: in bed;with call  bell/phone within reach Nurse Communication: Mobility status;Weight bearing status  (pt. negative for urination) CPM Right Knee CPM Right Knee: Off Right Knee Flexion (Degrees): 60 Right Knee Extension (Degrees): 0   GP     Ferman Hamming 06/03/2013, 3:54 PM Weldon Picking PT Acute Rehab Services [phone] Beeper [phone]

## 2013-06-04 ENCOUNTER — Inpatient Hospital Stay (HOSPITAL_COMMUNITY): Payer: Medicare Other

## 2013-06-04 LAB — CBC
HCT: 28.3 % — ABNORMAL LOW (ref 36.0–46.0)
MCH: 31.9 pg (ref 26.0–34.0)
MCHC: 35 g/dL (ref 30.0–36.0)
MCHC: 35.8 g/dL (ref 30.0–36.0)
Platelets: 148 10*3/uL — ABNORMAL LOW (ref 150–400)
Platelets: 169 10*3/uL (ref 150–400)
RDW: 13.1 % (ref 11.5–15.5)
RDW: 12.8 % (ref 11.5–15.5)
WBC: 10.4 10*3/uL (ref 4.0–10.5)

## 2013-06-04 LAB — URINE MICROSCOPIC-ADD ON

## 2013-06-04 LAB — URINALYSIS, ROUTINE W REFLEX MICROSCOPIC
Bilirubin Urine: NEGATIVE
Nitrite: NEGATIVE
Protein, ur: NEGATIVE mg/dL
Urobilinogen, UA: 0.2 mg/dL (ref 0.0–1.0)

## 2013-06-04 MED ORDER — PROMETHAZINE HCL 25 MG PO TABS
25.0000 mg | ORAL_TABLET | Freq: Four times a day (QID) | ORAL | Status: DC | PRN
  Administered 2013-06-04: 25 mg via ORAL
  Filled 2013-06-04: qty 1

## 2013-06-04 NOTE — Progress Notes (Signed)
Patient reported to be confused, unable to follow directions from [facility] to bed. Assist to chair with alarm. [id number]. A/O to name. O2 sat = 97 RA. Re-oriented to Place and situation. Family called concerned. PA notified -orders received and reported to oncoming RN.

## 2013-06-04 NOTE — Progress Notes (Signed)
Occupational Therapy Treatment Patient Details Name: Hannah Galvan MRN: [id number] DOB: 03/07/1931 Today's Date: 06/04/2013 Time: 6578-4696 OT Time Calculation (min): 22 min  OT Assessment / Plan / Recommendation  OT comments  Pt progressing toward goals.  Requiring mod assist with bed mobility and toilet transfer today.  Will see again tomorrow to prepare for safe return home.  Follow Up Recommendations  Home health OT    Barriers to Discharge       Equipment Recommendations  3 in 1 bedside comode;Tub/shower bench    Recommendations for Other Services    Frequency Min 2X/week   Progress towards OT Goals Progress towards OT goals: Progressing toward goals  Plan Discharge plan remains appropriate    Precautions / Restrictions Precautions Precautions: Knee Required Braces or Orthoses: Knee Immobilizer - Right Knee Immobilizer - Right: On when out of bed or walking Restrictions Weight Bearing Restrictions: Yes RLE Weight Bearing: Weight bearing as tolerated   Pertinent Vitals/Pain See vitals    ADL  Toilet Transfer: Performed;Moderate assistance Toilet Transfer Method: Stand pivot Acupuncturist: Bedside commode Toileting - Clothing Manipulation and Hygiene: Performed;Minimal assistance Where Assessed - Toileting Clothing Manipulation and Hygiene: Sit to stand from 3-in-1 or toilet Equipment Used: Knee Immobilizer;Gait belt;Rolling walker Transfers/Ambulation Related to ADLs: mod assist with RW.  +2 for chair follow during ambulation. ADL Comments: Mod assist for SPT from bed<>3n1.  Pt able to stand and perfrom toileting hygiene with min assist for clothing management.  Daughter present during session and asked whether it was best for 3n1 to be at bedside or over toilet (reports she has 2 3n1s).  Recommended that since pt has 2 3n1's, daughter should encourage pt to ambulate to bathroom at home as much as possible but keep one bedside for at night or when pt unable  to ambulate to bathroom.    OT Diagnosis:    OT Problem List:   OT Treatment Interventions:     OT Goals(current goals can now be found in the care plan section) Acute Rehab OT Goals Patient Stated Goal: to go to son's for recovery and rehab OT Goal Formulation: With patient Time For Goal Achievement: 06/17/13 Potential to Achieve Goals: Good ADL Goals Pt Will Perform Lower Body Bathing: with adaptive equipment;with min guard assist;sit to/from stand Pt Will Perform Lower Body Dressing: with min guard assist;with adaptive equipment;sit to/from stand Pt Will Transfer to Toilet: with min guard assist;ambulating;bedside commode Pt Will Perform Toileting - Clothing Manipulation and hygiene: with supervision;with caregiver independent in assisting;sitting/lateral leans Pt Will Perform Tub/Shower Transfer: with min assist;with caregiver independent in assisting;ambulating;tub bench Pt/caregiver will Perform Home Exercise Program: For increased ROM;With Supervision, verbal cues required/provided  Visit Information  Last OT Received On: 06/04/13 Assistance Needed: +1 History of Present Illness: Pt. admitted with R knee DJD  and underwent R TKA    Subjective Data      Prior Functioning       Cognition  Cognition Arousal/Alertness: Awake/alert Behavior During Therapy: WFL for tasks assessed/performed Overall Cognitive Status: Within Functional Limits for tasks assessed    Mobility  Bed Mobility Bed Mobility: Supine to Sit;Sitting - Scoot to Edge of Bed Supine to Sit: 3: Mod assist;With rails Sitting - Scoot to Edge of Bed: 3: Mod assist Details for Bed Mobility Assistance: Cues for technique and hand placement. Assist to support RLE and to scoot right hip EOB. Transfers Transfers: Sit to Stand;Stand to Sit Sit to Stand: 3: Mod assist;From bed;From chair/3-in-1;With upper extremity  assist;With armrests Stand to Sit: 3: Mod assist;To chair/3-in-1;With armrests;With upper extremity  assist Details for Transfer Assistance: Assist for power up. Cues for hand placement and technique.    Exercises      Balance     End of Session OT - End of Session Equipment Utilized During Treatment: Gait belt;Right knee immobilizer;Rolling walker Activity Tolerance: Patient tolerated treatment well Patient left: in chair;with call bell/phone within reach;with family/visitor present Nurse Communication: Mobility status  GO   06/04/2013 Cipriano Mile OTR/L Pager [phone] Office [phone]   Cipriano Mile 06/04/2013, 10:17 AM

## 2013-06-04 NOTE — Consult Note (Signed)
Requesting Provider: Lindwood Qua, PA-C/Peter Jerl Santos, MD  PCP:   Julian Hy, MD   Reason for consult: confusion s/p TKR  HPI: Ms. Hannah Galvan is an 79 year old white female with a history of hypertension, hypothyroidism, and osteoarthritis who was admitted for an elective right total knee replacement on 7/10. She tolerated the procedure well. Today, she was noted to have increased confusion. Her daughter-in-law states at that she was slightly confused this morning asking where she was. By the time she left around 2 PM she seemed to be back to baseline. She did develop some nausea throughout the day that did not respond to Zofran or Reglan. She received Phenergan 25 mg by mouth at 15:46. Her son called her around 6 PM she was very confused and disoriented. She is describing been taking out of her room and looking back into the room. She became very anxious about this. This was a significant change from earlier in the day and apparently was concerned about her confusion. Other than the nausea she's not had any other symptoms. She has been more lethargic today. No fever, chills, sweats. No cough. She has had frequent urination but no dysuria.  PRN Medications that she received today include:  Norco-10:30 Reglan- 12:22 Zofran- 10:30 Phenergan 15:46 Lorazepam 22:00   Review of Systems:  Review of Systems - All systems reviewed and are negative as in history of present illness the following exceptions: Dry mouth Past Medical History: Past Medical History  Diagnosis Date  . Thyroid disease   . Hypertension   . Hyperlipidemia   . Arthritis   . Cancer     breast, s/p RXT, bilateral  . Hypothyroidism   . Shortness of breath    Breast cancer 2007/2000 Abn. LFTs OA Hyperlipidemia Hypertension IGT Colon polyps check 2010 Carotid diseaseL 08/12    Past Surgical History  Procedure Laterality Date  . Abdominal hysterectomy  2008  . Thyroid surgery  2002  . Breast lumpectomy   2000  . Breast lumpectomy  2007  . Cataract extraction  2011  . Rotator cuff repair      right  . Knee surgery      right  . Bladder tact    . Total knee arthroplasty Left 06/02/2013    Dr Jerl Santos  . Total knee arthroplasty Right 06/02/2013    Procedure: RIGHT TOTAL KNEE ARTHROPLASTY;  Surgeon: Velna Ochs, MD;  Location: [facility] OR;  Service: Orthopedics;  Laterality: Right;    Medications: Prior to Admission medications   Medication Sig Start Date End Date Taking? Authorizing Provider  Calcium Carbonate-Vitamin D (CALCIUM + D) 600-200 MG-UNIT TABS Take 1 tablet by mouth 2 (two) times daily.     Yes Historical Provider, MD  Cyanocobalamin (VITAMIN B-12) 5000 MCG SUBL Take 1 tablet by mouth daily.     Yes Historical Provider, MD  escitalopram (LEXAPRO) 10 MG tablet  05/06/13  Yes Historical Provider, MD  exemestane (AROMASIN) 25 MG tablet Take 25 mg by mouth daily after breakfast. 04/06/13  Yes Josph Macho, MD  folic acid (FOLVITE) 1 MG tablet Take 1 tablet (1 mg total) by mouth daily. 10/28/12  Yes Kendrick Ranch, MD  levothyroxine (SYNTHROID) 125 MCG tablet Take 1 tablet (125 mcg total) by mouth daily. 10/28/12  Yes Kendrick Ranch, MD  LORazepam (ATIVAN) 1 MG tablet Take 1 mg by mouth at bedtime.   Yes Historical Provider, MD  losartan-hydrochlorothiazide (HYZAAR) 100-25 MG per tablet Take 0.5 tablets by mouth daily.  Yes Historical Provider, MD  ondansetron (ZOFRAN-ODT) 8 MG disintegrating tablet Take 8 mg by mouth as needed for nausea.    Yes Historical Provider, MD  potassium chloride SA (K-DUR,KLOR-CON) 20 MEQ tablet Take 1 tablet (20 mEq total) by mouth daily. 03/25/13  Yes Cassell Clement, MD  rosuvastatin (CRESTOR) 10 MG tablet Take 5 mg by mouth daily.    Yes Historical Provider, MD  zolpidem (AMBIEN) 5 MG tablet Take 5 mg by mouth at bedtime as needed.  03/10/13  Yes Historical Provider, MD    Current Medications: . aspirin EC  325 mg Oral BID  . atorvastatin   20 mg Oral q1800  . calcium-vitamin D  1 tablet Oral BID  . docusate sodium  100 mg Oral BID  . escitalopram  5 mg Oral Daily  . exemestane  25 mg Oral QPC breakfast  . ferrous sulfate  325 mg Oral Q breakfast  . folic acid  1 mg Oral Daily  . losartan  50 mg Oral Daily   And  . hydrochlorothiazide  12.5 mg Oral Daily  . levothyroxine  125 mcg Oral QAC breakfast  . LORazepam  1 mg Oral QHS  . potassium chloride SA  20 mEq Oral Daily  . vitamin B-12  1,000 mcg Oral Daily   PRN Medications: acetaminophen, acetaminophen, alum & mag hydroxide-simeth, bisacodyl, diphenhydrAMINE, HYDROcodone-acetaminophen, HYDROmorphone (DILAUDID) injection, menthol-cetylpyridinium, methocarbamol (ROBAXIN) IV, methocarbamol, metoCLOPramide (REGLAN) injection, metoCLOPramide, ondansetron (ZOFRAN) IV, ondansetron, phenol, promethazine, zolpidem  Allergies:   Allergies  Allergen Reactions  . Pyridium (Phenazopyridine Hcl) Other (See Comments)    hemolysis  . Sulfa Antibiotics Rash    Social History:  reports that she has never smoked. She has never used smokeless tobacco. She reports that she does not drink alcohol or use illicit drugs. Married 1955  widowed 2014 with 2 children and 3 grandchildren GGson 21 mos in 2011 No tobacco.   Family History: Family History  Problem Relation Age of Onset  . Hypertension Mother   . Stroke Mother   . Cancer Father   . Cancer Sister     leukemia  . Cancer Brother     leukemia  . Cancer Sister     vaginal  . Cancer Sister     bladder  . Cancer Brother     prostate, skin  . Cancer Brother     colon  . Cancer Brother     prostate, bone mets  . Cancer Brother     skin  . Heart disease Sister     Physical Exam: Filed Vitals:   06/03/13 1358 06/03/13 2110 06/04/13 0555 06/04/13 1602  BP: 143/56 147/67 130/66 166/74  Pulse: 83 87 80 105  Temp: 99.3 F (37.4 C) 98.7 F (37.1 C) 98.6 F (37 C) 98.5 F (36.9 C)  TempSrc:  Oral Oral Oral  Resp: 16  16 16 18   SpO2: 95% 97% 96% 96%   General appearance: alert and No apparent confusion that she is a little tangential when describing circumstances from earlier today Head: Normocephalic, without obvious abnormality, atraumatic Eyes: conjunctivae/corneas clear. PERRL, EOM's intact.  Nose: Nares normal. Septum midline. Mucosa normal. No drainage or sinus tenderness. Throat: Oropharynx is dry  Neck: no adenopathy, no carotid bruit, no JVD and thyroid not enlarged, symmetric, no tenderness/mass/nodules Resp: clear to auscultation bilaterally Cardio: regular rate and rhythm GI: soft, non-tender; bowel sounds normal; no masses,  no organomegaly Extremities: extremities normal, atraumatic, no cyanosis and he: Trace bilateral lower extremity  edema Pulses: 2+ and symmetric Lymph nodes:no cervical lymphadenopathy Neurologic: Alert and oriented X 3, normal strength and tone. Normal symmetric reflexes.   Labs on Admission:   Recent Labs  06/03/13 0550  NA 135  K 3.3*  CL 98  CO2 28  GLUCOSE 140*  BUN 10  CREATININE 0.66  CALCIUM 8.2*    Recent Labs  06/03/13 0550 06/04/13 0515  WBC 9.0 10.4  HGB 12.0 9.9*  HCT 34.6* 28.3*  MCV 92.0 91.0  PLT 164 148*   No results found for this basename: CKTOTAL, CKMB, CKMBINDEX, TROPONINI,  in the last 72 hours Lab Results  Component Value Date   INR 1.05 05/24/2013   INR 1.0 05/07/2007    Radiological Exams on Admission: Dg Chest 2 View  05/24/2013   *RADIOLOGY REPORT*  Clinical Data: Preoperative study, history of hypertension  CHEST - 2 VIEW  Comparison: 02/12/2012  Findings: Heart size and vascular pattern are normal.  Lungs are clear.  IMPRESSION: No acute findings   Original Report Authenticated By: Esperanza Heir, M.D.   Orders placed during the hospital encounter of 05/24/13  . EKG 12-LEAD  . EKG 12-LEAD    Assessment/Plan 1. Delirium-waxing and waning mental status throughout the day today is consistent with postoperative  delirium/sundowning. Most likely cause is polypharmacy related to narcotics, anxiolytics, and Phenergan. We'll try to limit these medications as much as possible. Fortunately, her pain has been fairly well controlled and she is requiring minimal narcotics. I discontinued Reglan, Phenergan and Ambien we will try to use Zofran for nausea. Agree with labs as ordered including urinalysis to rule out UTI, CBC, electrolytes. Chest x-ray is pending. 2. Hypertension-we'll continue current medicines. May need to increase a certain dose. Monitor electrolytes with HCTZ. 3. Hypothyroidism-continue Synthroid. Check TSH. 4. S/p TKR- on ASA for DVT prophylaxis per Ortho. 5. polyuria-urinalysis is pending. Blood sugar was slightly elevated but probably not high enough to cause polyuria. Obtain bladder scan to rule out urinary retention.   Detrice Cales,W DOUGLAS 06/04/2013, 8:50 PM

## 2013-06-04 NOTE — Progress Notes (Signed)
Physical Therapy Treatment Patient Details Name: FLEURETTE WOOLBRIGHT MRN: [id number] DOB: 12/13/1930 Today's Date: 06/04/2013 Time: 4098-1191 PT Time Calculation (min): 14 min  PT Assessment / Plan / Recommendation  PT Comments   Pt anxious during Rx this PM.  She reports having to repeatedly go to the bathroom to urinate because she is nervous.  RN notified.  Follow Up Recommendations  Home health PT;Supervision/Assistance - 24 hour     Does the patient have the potential to tolerate intense rehabilitation     Barriers to Discharge        Equipment Recommendations  None recommended by PT    Recommendations for Other Services    Frequency 7X/week   Progress towards PT Goals Progress towards PT goals: Progressing toward goals  Plan Current plan remains appropriate    Precautions / Restrictions Precautions Precautions: Knee Required Braces or Orthoses: Knee Immobilizer - Right Knee Immobilizer - Right: On when out of bed or walking Restrictions RLE Weight Bearing: Weight bearing as tolerated   Pertinent Vitals/Pain     Mobility  Bed Mobility Bed Mobility: Sit to Supine Supine to Sit: 4: Min assist Sit to Supine: 4: Min assist Details for Bed Mobility Assistance: verbal cues for sequencing Transfers Sit to Stand: 3: Mod assist;From bed;From chair/3-in-1;With upper extremity assist Stand to Sit: 4: Min assist;To bed;To chair/3-in-1;With upper extremity assist Details for Transfer Assistance: verbal cues for sequencing Ambulation/Gait Ambulation/Gait Assistance: 4: Min guard Ambulation Distance (Feet): 15 Feet (x 2) Assistive device: Rolling walker Ambulation/Gait Assistance Details: verbal cues for posture, RW management Gait Pattern: Step-to pattern;Decreased stride length Gait velocity: decreased    Exercises Total Joint Exercises Ankle Circles/Pumps: AROM;Both;10 reps;Supine Quad Sets: AROM;Right;10 reps;Supine Heel Slides: AAROM;Right;10 reps;Supine Hip  ABduction/ADduction: AAROM;Right;10 reps;Supine   PT Diagnosis:    PT Problem List:   PT Treatment Interventions:     PT Goals (current goals can now be found in the care plan section)    Visit Information  Last PT Received On: 06/04/13 Assistance Needed: +1    Subjective Data      Cognition  Cognition Arousal/Alertness: Awake/alert Behavior During Therapy: Anxious Overall Cognitive Status: Within Functional Limits for tasks assessed    Balance     End of Session PT - End of Session Equipment Utilized During Treatment: Gait belt Activity Tolerance: Other (comment) (Pt very anxious during Rx.) CPM Right Knee CPM Right Knee: On Right Knee Flexion (Degrees): 60 Right Knee Extension (Degrees): 0   GP     Ilda Foil 06/04/2013, 3:33 PM  Aida Raider, PT  Office # [phone] Pager [phone]

## 2013-06-04 NOTE — Progress Notes (Signed)
Physical Therapy Treatment Patient Details Name: Hannah Galvan MRN: [id number] DOB: 24-May-1931 Today's Date: 06/04/2013 Time: 4098-1191 PT Time Calculation (min): 31 min  PT Assessment / Plan / Recommendation  PT Comments   Reviewed HEP with pt's daughter in law.  Follow Up Recommendations  Home health PT;Supervision/Assistance - 24 hour     Does the patient have the potential to tolerate intense rehabilitation     Barriers to Discharge        Equipment Recommendations  None recommended by PT    Recommendations for Other Services    Frequency 7X/week   Progress towards PT Goals Progress towards PT goals: Progressing toward goals  Plan Current plan remains appropriate    Precautions / Restrictions Precautions Precautions: Knee Required Braces or Orthoses: Knee Immobilizer - Right Knee Immobilizer - Right: On when out of bed or walking Restrictions Weight Bearing Restrictions: Yes RLE Weight Bearing: Weight bearing as tolerated   Pertinent Vitals/Pain 8/10    Mobility  Bed Mobility Bed Mobility: Supine to Sit;Sitting - Scoot to Edge of Bed Supine to Sit: 3: Mod assist;HOB elevated;With rails Sitting - Scoot to Edge of Bed: 3: Mod assist;With rail Details for Bed Mobility Assistance: verbal cues for sequencing Transfers Sit to Stand: 3: Mod assist;From bed;With upper extremity assist Stand to Sit: 3: Mod assist;To chair/3-in-1;With armrests Details for Transfer Assistance: verbal cues for sequencing, hand placement Ambulation/Gait Ambulation/Gait Assistance: 4: Min guard Ambulation Distance (Feet): 50 Feet Assistive device: Rolling walker Ambulation/Gait Assistance Details: verbal cues for sequencing, posture Gait Pattern: Step-to pattern;Decreased stride length Gait velocity: decreased    Exercises     PT Diagnosis:    PT Problem List:   PT Treatment Interventions:     PT Goals (current goals can now be found in the care plan section) Acute Rehab PT  Goals Patient Stated Goal: to go to son's house at d/c Potential to Achieve Goals: Good  Visit Information  Last PT Received On: 06/04/13 Assistance Needed: +1 PT/OT Co-Evaluation/Treatment: Yes History of Present Illness: Pt. admitted with R knee DJD  and underwent R TKA    Subjective Data  Patient Stated Goal: to go to son's house at d/c   Cognition  Cognition Arousal/Alertness: Awake/alert Behavior During Therapy: WFL for tasks assessed/performed Overall Cognitive Status: Within Functional Limits for tasks assessed    Balance     End of Session PT - End of Session Equipment Utilized During Treatment: Gait belt;Right knee immobilizer Activity Tolerance: Patient tolerated treatment well Patient left: in chair;with call bell/phone within reach;with family/visitor present Nurse Communication: Patient requests pain meds CPM Right Knee CPM Right Knee: Off   GP     Ilda Foil 06/04/2013, 10:20 AM  Aida Raider, PT  Office # [phone] Pager [phone]

## 2013-06-04 NOTE — Progress Notes (Signed)
Patient continues with nausea in spite of Zofran and Reglan.Unable to eat or take meds. PA notified and Phenergan tabs as at home ordered.

## 2013-06-04 NOTE — Progress Notes (Signed)
Subjective: 2 Days Post-Op Procedure(s) (LRB): RIGHT TOTAL KNEE ARTHROPLASTY (Right)  Activity level:  Weightbearing as tolerated Diet tolerance:  Eating Voiding:  Okay Patient reports pain as 3 on 0-10 scale.    Objective: Vital signs in last 24 hours: Temp:  [98.6 F (37 C)-99.3 F (37.4 C)] 98.6 F (37 C) (07/12 0555) Pulse Rate:  [80-87] 80 (07/12 0555) Resp:  [16-18] 16 (07/12 0555) BP: (130-147)/(56-67) 130/66 mmHg (07/12 0555) SpO2:  [94 %-97 %] 96 % (07/12 0555)  Labs:  Recent Labs  06/03/13 0550 06/04/13 0515  HGB 12.0 9.9*    Recent Labs  06/03/13 0550 06/04/13 0515  WBC 9.0 10.4  RBC 3.76* 3.11*  HCT 34.6* 28.3*  PLT 164 148*    Recent Labs  06/03/13 0550  NA 135  K 3.3*  CL 98  CO2 28  BUN 10  CREATININE 0.66  GLUCOSE 140*  CALCIUM 8.2*   No results found for this basename: LABPT, INR,  in the last 72 hours  Physical Exam:  Neurologically intact ABD soft Neurovascular intact Sensation intact distally Intact pulses distally Dorsiflexion/Plantar flexion intact No cellulitis present Compartment soft  Assessment/Plan:  2 Days Post-Op Procedure(s) (LRB): RIGHT TOTAL KNEE ARTHROPLASTY (Right) Advance diet Up with therapy Plan for discharge tomorrow with home therapy if cleared by therapist in hospital. Patient is making progress but it is slow. Continue ASA twice a day and SCDs for DVT prophylaxis New dressing today    Terique Kawabata R 06/04/2013, 7:43 AM

## 2013-06-05 DIAGNOSIS — R410 Disorientation, unspecified: Secondary | ICD-10-CM | POA: Diagnosis not present

## 2013-06-05 DIAGNOSIS — E876 Hypokalemia: Secondary | ICD-10-CM | POA: Diagnosis not present

## 2013-06-05 DIAGNOSIS — E039 Hypothyroidism, unspecified: Secondary | ICD-10-CM | POA: Diagnosis present

## 2013-06-05 DIAGNOSIS — E871 Hypo-osmolality and hyponatremia: Secondary | ICD-10-CM | POA: Diagnosis not present

## 2013-06-05 DIAGNOSIS — R338 Other retention of urine: Secondary | ICD-10-CM | POA: Diagnosis not present

## 2013-06-05 DIAGNOSIS — R509 Fever, unspecified: Secondary | ICD-10-CM | POA: Diagnosis present

## 2013-06-05 LAB — SODIUM, URINE, RANDOM: Sodium, Ur: 78 mEq/L

## 2013-06-05 LAB — BASIC METABOLIC PANEL
BUN: 7 mg/dL (ref 6–23)
BUN: 8 mg/dL (ref 6–23)
Calcium: 9.2 mg/dL (ref 8.4–10.5)
Calcium: 8.9 mg/dL (ref 8.4–10.5)
Creatinine, Ser: 0.73 mg/dL (ref 0.50–1.10)
GFR calc Af Amer: >90 mL/min (ref >90)
GFR calc Af Amer: >90 mL/min (ref >90)
GFR calc non Af Amer: 81 mL/min — ABNORMAL LOW (ref >90)
Glucose, Bld: 123 mg/dL — ABNORMAL HIGH (ref 70–99)

## 2013-06-05 MED ORDER — POTASSIUM CHLORIDE CRYS ER 20 MEQ PO TBCR
40.0000 meq | EXTENDED_RELEASE_TABLET | Freq: Three times a day (TID) | ORAL | Status: AC
  Administered 2013-06-05 – 2013-06-06 (×3): 40 meq via ORAL
  Filled 2013-06-05 (×3): qty 2

## 2013-06-05 MED ORDER — SODIUM CHLORIDE 0.9 % IV SOLN: INTRAVENOUS | Status: DC

## 2013-06-05 MED ORDER — TAMSULOSIN HCL 0.4 MG PO CAPS
0.4000 mg | ORAL_CAPSULE | Freq: Every day | ORAL | Status: DC
  Administered 2013-06-05 – 2013-06-07 (×3): 0.4 mg via ORAL
  Filled 2013-06-05 (×4): qty 1

## 2013-06-05 MED ORDER — POTASSIUM CHLORIDE CRYS ER 20 MEQ PO TBCR
40.0000 meq | EXTENDED_RELEASE_TABLET | Freq: Four times a day (QID) | ORAL | Status: DC
  Administered 2013-06-05: 40 meq via ORAL
  Filled 2013-06-05: qty 2

## 2013-06-05 NOTE — Progress Notes (Signed)
Pt is disoriented to place only,keeps saying she is at home and is afraid of being alone.Son Marcial Pacas Moralez called per pt request to come and be with her.In and out cath done x 2 since 11pm,both bladder scans showed >999  Cc.Pt has been incont in bed but when up to bsc was unable to void.1250 cc 1st cath at 11pm, 1350cc 2nd cathed amt.at 0500. Linward Headland D

## 2013-06-05 NOTE — Progress Notes (Signed)
Physical Therapy Treatment Patient Details Name: Hannah Galvan MRN: [id number] DOB: 05-08-1931 Today's Date: 06/05/2013 Time: 4098-1191 PT Time Calculation (min): 38 min  PT Assessment / Plan / Recommendation  PT Comments   Labs showed decreased Na+ and K+ which is believed to be the source of pt's recent confusion.  Pt appeared more clear today with no anxiety.  Family states MD told them pt should be ready for d/c Tuesday from a medical standpoint.  Pt is progress well with PT at this time and should be able to d/c home with family and HHPT.  Follow Up Recommendations  Home health PT;Supervision/Assistance - 24 hour     Does the patient have the potential to tolerate intense rehabilitation     Barriers to Discharge        Equipment Recommendations  None recommended by PT    Recommendations for Other Services    Frequency 7X/week   Progress towards PT Goals Progress towards PT goals: Progressing toward goals  Plan Current plan remains appropriate    Precautions / Restrictions Precautions Precautions: Knee Required Braces or Orthoses:  (KI d/c'd 06-03-13 PM per MD order) Restrictions RLE Weight Bearing: Weight bearing as tolerated   Pertinent Vitals/Pain 5/10    Mobility  Transfers Sit to Stand: 4: Min assist;From chair/3-in-1;With armrests;With upper extremity assist Stand to Sit: 4: Min assist;To chair/3-in-1;With upper extremity assist;With armrests Details for Transfer Assistance: verbal cues for sequencing Ambulation/Gait Ambulation/Gait Assistance: 4: Min guard Ambulation Distance (Feet): 160 Feet Assistive device: Rolling walker Ambulation/Gait Assistance Details: verbal cues for posture, RW management with turning. Gait Pattern: Decreased stride length;Antalgic;Step-through pattern Gait velocity: decreased    Exercises Total Joint Exercises Ankle Circles/Pumps: AROM;Both;10 reps Quad Sets: AROM;Right;10 reps Short Arc QuadBarbaraann Boys;Right;10 reps Heel Slides:  AAROM;Right;10 reps Hip ABduction/ADduction: AAROM;Right;10 reps Goniometric ROM: -5 to 65   PT Diagnosis:    PT Problem List:   PT Treatment Interventions:     PT Goals (current goals can now be found in the care plan section)    Visit Information  Last PT Received On: 06/05/13 Assistance Needed: +1    Subjective Data      Cognition  Cognition Arousal/Alertness: Awake/alert Behavior During Therapy: WFL for tasks assessed/performed Overall Cognitive Status: Within Functional Limits for tasks assessed    Balance     End of Session PT - End of Session Equipment Utilized During Treatment: Gait belt Activity Tolerance: Patient tolerated treatment well Patient left: in chair;with family/visitor present Nurse Communication: Mobility status   GP     Ilda Foil 06/05/2013, 2:55 PM  Aida Raider, PT  Office # [phone] Pager [phone]

## 2013-06-05 NOTE — Progress Notes (Signed)
CSW received consult for SNF placement, CSW confirmed with patient & Junious Dresser at bedside that plan now is for d/c home with home health. No CSW needs identified - CSW signing off.   Unice Bailey, LCSW  Clinical Social Worker  cell #: [phone]  (weekend coverage)

## 2013-06-05 NOTE — Progress Notes (Signed)
Subjective: Bladder scan revealed urinary retention-->PVR 1250cc, 1350cc.  Na was 124.  Reports disorientation this am- some paranoia regarding catheter, blood draw.  Thought they were tearing her house down.    Objective: Vital signs in last 24 hours: Temp:  [98.5 F (36.9 C)-100.5 F (38.1 C)] 99 F (37.2 C) (07/13 0500) Pulse Rate:  [89-105] 89 (07/13 0500) Resp:  [16-18] 16 (07/13 0500) BP: (126-166)/(57-74) 126/57 mmHg (07/13 0500) SpO2:  [96 %-98 %] 98 % (07/13 0500) Weight change:  Last BM Date: 06/02/13  CBG (last 3)  No results found for this basename: GLUCAP,  in the last 72 hours  Intake/Output from previous day: 07/12 0701 - 07/13 0700 In: 720 [P.O.:120; I.V.:600] Out: 4049 [Urine:4049] Intake/Output this shift: Total I/O In: 480 [P.O.:480] Out: -   General appearance: slightly disoriented but redirectable Eyes: no scleral icterus Throat: oropharynx moist without erythema Resp: minimal bibasilar crackles Cardio: regular rate and rhythm and grade 3/6 SEM LUSB GI: soft, non-tender; bowel sounds normal; no masses,  no organomegaly Extremities: no clubbing, cyanosis; 1+ right lower extremity edema; trace left lower extremity edema   Lab Results:  Recent Labs  06/03/13 0550 06/04/13 2310  NA 135 124*  K 3.3* 2.8*  CL 98 89*  CO2 28 28  GLUCOSE 140* 123*  BUN 10 7  CREATININE 0.66 0.66  CALCIUM 8.2* 9.2   No results found for this basename: AST, ALT, ALKPHOS, BILITOT, PROT, ALBUMIN,  in the last 72 hours  Recent Labs  06/04/13 0515 06/04/13 2310  WBC 10.4 10.3  HGB 9.9* 10.8*  HCT 28.3* 30.2*  MCV 91.0 89.1  PLT 148* 169   Lab Results  Component Value Date   INR 1.05 05/24/2013   INR 1.0 05/07/2007   No results found for this basename: CKTOTAL, CKMB, CKMBINDEX, TROPONINI,  in the last 72 hours No results found for this basename: TSH, T4TOTAL, FREET3, T3FREE, THYROIDAB,  in the last 72 hours No results found for this basename: VITAMINB12,  FOLATE, FERRITIN, TIBC, IRON, RETICCTPCT,  in the last 72 hours  Studies/Results: Dg Chest 2 View  06/04/2013   *RADIOLOGY REPORT*  Clinical Data: Pneumonia.  Hypertension.  CHEST - 2 VIEW  Comparison: 05/24/2013.  Findings: Normal sized heart.  Interval linear density in the right inferior perihilar region.  Interval minimal patchy opacity at the left lateral lung base with possible small left pleural effusion. Stable right axillary surgical clips.  Mild thoracic spine degenerative changes.  IMPRESSION:  1.  Interval right perihilar atelectasis. 2.  Interval minimal patchy atelectasis or pneumonia at the left lateral lung base with a probable small left pleural effusion.   Original Report Authenticated By: Beckie Salts, M.D.     Medications: Scheduled: . aspirin EC  325 mg Oral BID  . atorvastatin  20 mg Oral q1800  . calcium-vitamin D  1 tablet Oral BID  . docusate sodium  100 mg Oral BID  . escitalopram  5 mg Oral Daily  . exemestane  25 mg Oral QPC breakfast  . ferrous sulfate  325 mg Oral Q breakfast  . folic acid  1 mg Oral Daily  . levothyroxine  125 mcg Oral QAC breakfast  . LORazepam  1 mg Oral QHS  . losartan  50 mg Oral Daily  . potassium chloride SA  40 mEq Oral Q6H  . tamsulosin  0.4 mg Oral Daily  . vitamin B-12  1,000 mcg Oral Daily   Continuous: . sodium chloride  Assessment/Plan: 1. Acute delirium- secondary to electrolyte disturbances/metabolic encephalopathy related to hyponatremia, urinary retention and medications.  Hopefully, correcting electrolyte disturbance may improve confusion.  Continue avoiding sedating medications. 2. Hyponatremia- multifactorial related to HCTZ and urinary retention.  Will hold HCTZ (received last pm) and resume IV fluids with Normal Saline.  Obtain urine sodium, serum/urine osmolality to evaluate for superimposed SIADH.  Recheck this am and monitor closely.  3. Hypokalemia- repleted orally. 4. Acute urinary retention- post-anesthetic  effect.  Place foley.  Start flomax which may help in females.  Voiding trial in 1-2 days. 5. Hypertension- continue losartan.  Hold HCTZ 6. Fever, unspecified- low grade fever likely secondary to atelectasis.  No significant cough or sputum to suggest pneumonia.  Continue Incentive spirometry.  Low threshold to treat for pneumonia if temp above 101.5. 7. Hypothyroidism- continue synthroid.  Check TSH. 8. 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  On ASA for DVT prophylaxis per ortho.   LOS: 3 days   SHAW,W DOUGLAS 06/05/2013, 9:17 AM

## 2013-06-05 NOTE — Progress Notes (Addendum)
Subjective: 3 Days Post-Op Procedure(s) (LRB): RIGHT TOTAL KNEE ARTHROPLASTY (Right) Acute delirium Hyponatremia and hypokalemia Urinary retention  Activity level:  Has been up with therapy weightbearing as tolerated Diet tolerance:  Regular Voiding:  Foley catheter Patient reports pain as 2 on 0-10 scale.    Objective: Vital signs in last 24 hours: Temp:  [98.5 F (36.9 C)-100.5 F (38.1 C)] 99 F (37.2 C) (07/13 0500) Pulse Rate:  [89-105] 89 (07/13 0500) Resp:  [16-18] 16 (07/13 0500) BP: (126-166)/(57-74) 126/57 mmHg (07/13 0500) SpO2:  [96 %-98 %] 98 % (07/13 0500)  Labs:  Recent Labs  06/03/13 0550 06/04/13 0515 06/04/13 2310  HGB 12.0 9.9* 10.8*    Recent Labs  06/04/13 0515 06/04/13 2310  WBC 10.4 10.3  RBC 3.11* 3.39*  HCT 28.3* 30.2*  PLT 148* 169    Recent Labs  06/04/13 2310 06/05/13 0948  NA 124* 136  K 2.8* 2.7*  CL 89* 97  CO2 28 28  BUN 7 8  CREATININE 0.66 0.73  GLUCOSE 123* 108*  CALCIUM 9.2 8.9   No results found for this basename: LABPT, INR,  in the last 72 hours  Physical Exam:  Neurologically intact ABD soft Neurovascular intact Sensation intact distally Intact pulses distally Dorsiflexion/Plantar flexion intact Incision: dressing C/D/I No cellulitis present Compartment soft  Assessment/Plan: I feel like she is less confused today and does answer questions appropriately. Dr. Clelia Croft has seen this patient and I am very grateful. 3 Days Post-Op Procedure(s) (LRB): RIGHT TOTAL KNEE ARTHROPLASTY (Right) Advance diet Up with therapy Continue ASA 325 twice a day Sodium has improved and she continues with potassium repletion She will continue with incentive spirometry at bedside. Foley catheter inserted and patient placed on Flomax. Well plan discharge  when medical consult thinks appropriate. At this point family still considers taking patient home with home therapy. Both her son and daughter-in-law are available to be with  her 24/ 7. They are not opposed to a skilled nursing facility if necessary however.     Holman Bonsignore R 06/05/2013, 12:48 PM

## 2013-06-05 NOTE — Progress Notes (Signed)
Orthopedic Tech Progress Note Patient Details:  SECILIA APPS 1931/09/03 [id number] Put on cpm at 8:15pm Patient ID: Lavonna Rua, female   DOB: [birth year], 79 y.o.   MRN: [id number]   Jennye Moccasin 06/05/2013, 9:01 PM

## 2013-06-05 NOTE — Progress Notes (Addendum)
Potassium level 2.7 from lab - drawn at 0948. Last pm blood re-run to check accuracy by lab with same results. Dr Clelia Croft informed.

## 2013-06-06 ENCOUNTER — Telehealth: Payer: Self-pay | Admitting: Hematology & Oncology

## 2013-06-06 LAB — BASIC METABOLIC PANEL
CO2: 24 mEq/L (ref 19–32)
Calcium: 8.8 mg/dL (ref 8.4–10.5)
Chloride: 104 mEq/L (ref 96–112)
Creatinine, Ser: 0.66 mg/dL (ref 0.50–1.10)
Glucose, Bld: 102 mg/dL — ABNORMAL HIGH (ref 70–99)

## 2013-06-06 MED ORDER — MAGIC MOUTHWASH: 5.0000 mL | Freq: Four times a day (QID) | ORAL | Status: DC

## 2013-06-06 MED ORDER — HYDROCORTISONE 1 % EX CREA
TOPICAL_CREAM | Freq: Two times a day (BID) | CUTANEOUS | Status: DC
  Administered 2013-06-06 – 2013-06-07 (×2): via TOPICAL
  Filled 2013-06-06: qty 28

## 2013-06-06 MED ORDER — ASPIRIN 325 MG PO TBEC: 325.0000 mg | DELAYED_RELEASE_TABLET | Freq: Two times a day (BID) | ORAL | Status: DC

## 2013-06-06 MED ORDER — MAGIC MOUTHWASH
5.0000 mL | Freq: Four times a day (QID) | ORAL | Status: DC
  Administered 2013-06-06 – 2013-06-07 (×4): 5 mL via ORAL
  Filled 2013-06-06 (×9): qty 5

## 2013-06-06 MED ORDER — HYDROCODONE-ACETAMINOPHEN 5-325 MG PO TABS: 1.0000 | ORAL_TABLET | Freq: Three times a day (TID) | ORAL | Status: DC | PRN

## 2013-06-06 NOTE — Progress Notes (Signed)
MD, patient is complaining of itching and redness in her left AC.  She said that blood was taken yesterday and a bandaid was placed in that spot.  I assessed her LAC and is it red.  We applied some lotion but the itching is still present.  I offered Benadryl but patient refused.

## 2013-06-06 NOTE — Progress Notes (Signed)
Subjective: Feels better. Had a good BM. Some mouth soreness. Has some contact dermatitis on left antecubital area (? Tape)  Pain is fair. Not eating well. Breathing well. mentating well   Objective: Vital signs in last 24 hours: Temp:  [98.7 F (37.1 C)-100.3 F (37.9 C)] 98.7 F (37.1 C) (07/14 0651) Pulse Rate:  [86-102] 96 (07/14 0651) Resp:  [16] 16 (07/14 0651) BP: (141-145)/(57-63) 145/63 mmHg (07/13 2136) SpO2:  [96 %-100 %] 96 % (07/14 0651)  Intake/Output from previous day: 07/13 0701 - 07/14 0700 In: 1080 [P.O.:1080] Out: 2300 [Urine:2300] Intake/Output this shift:    Standing with walker. Steady. Face symmetric, oral membranes reddened. Lungs clear ht regular with sys  Murmur. Awake, alert, recognizes me easily. Clear fluent speech, no tremor  Lab Results   Recent Labs  06/04/13 0515 06/04/13 2310  WBC 10.4 10.3  RBC 3.11* 3.39*  HGB 9.9* 10.8*  HCT 28.3* 30.2*  MCV 91.0 89.1  MCH 31.8 31.9  RDW 13.1 12.8  PLT 148* 169    Recent Labs  06/05/13 0948 06/06/13 0625  NA 136 137  K 2.7* 3.7  CL 97 104  CO2 28 24  GLUCOSE 108* 102*  BUN 8 9  CREATININE 0.73 0.66  CALCIUM 8.9 8.8   Results for NATHALY, DAWKINS (MRN [id number]) as of 06/06/2013 08:58  Ref. Range 06/05/2013 09:48  TSH Latest Range: 0.350-4.500 uIU/mL 0.323 (L)   Studies/Results: Dg Chest 2 View  06/04/2013   *RADIOLOGY REPORT*  Clinical Data: Pneumonia.  Hypertension.  CHEST - 2 VIEW  Comparison: 05/24/2013.  Findings: Normal sized heart.  Interval linear density in the right inferior perihilar region.  Interval minimal patchy opacity at the left lateral lung base with possible small left pleural effusion. Stable right axillary surgical clips.  Mild thoracic spine degenerative changes.  IMPRESSION:  1.  Interval right perihilar atelectasis. 2.  Interval minimal patchy atelectasis or pneumonia at the left lateral lung base with a probable small left pleural effusion.   Original Report  Authenticated By: Beckie Salts, M.D.    Scheduled Meds: . aspirin EC  325 mg Oral BID  . atorvastatin  20 mg Oral q1800  . calcium-vitamin D  1 tablet Oral BID  . docusate sodium  100 mg Oral BID  . escitalopram  5 mg Oral Daily  . exemestane  25 mg Oral QPC breakfast  . ferrous sulfate  325 mg Oral Q breakfast  . folic acid  1 mg Oral Daily  . levothyroxine  125 mcg Oral QAC breakfast  . LORazepam  1 mg Oral QHS  . losartan  50 mg Oral Daily  . potassium chloride SA  40 mEq Oral TID  . tamsulosin  0.4 mg Oral Daily  . vitamin B-12  1,000 mcg Oral Daily   Continuous Infusions: . sodium chloride 75 mL/hr (06/05/13 0915)   PRN Meds:acetaminophen, acetaminophen, alum & mag hydroxide-simeth, bisacodyl, diphenhydrAMINE, HYDROcodone-acetaminophen, HYDROmorphone (DILAUDID) injection, menthol-cetylpyridinium, methocarbamol (ROBAXIN) IV, methocarbamol, ondansetron (ZOFRAN) IV, ondansetron, phenol  Assessment/Plan: DELIRIUM: resolved HYPONATREMIA: resolved HYPOKALEMIA: resolved ORAL SORENESS: add magic mouthwash HYPERTENSION: BP improved HYPOTHYROID: TSH OK  LOS: 4 days   Loralai Eisman ALAN 06/06/2013, 8:55 AM

## 2013-06-06 NOTE — Telephone Encounter (Addendum)
Message copied by Cathi Roan on Mon Jun 06, 2013 11:15 AM ------      Message from: Arlan Organ R      Created: Thu May 26, 2013  7:23 AM       Call - labs are ok!!  Cindee Lame -----  06-06-13,  Called and left message on home phone regarding above MD note, advised to call office if any questions. Lupita Raider LPN

## 2013-06-06 NOTE — Progress Notes (Signed)
Seen and agreed 06/06/2013 Fredrich Birks PTA [phone] pager [phone] office

## 2013-06-06 NOTE — Progress Notes (Signed)
Occupational Therapy Treatment Patient Details Name: Hannah Galvan MRN: [id number] DOB: [birth year] Today's Date: 06/06/2013 Time: 4098-1191 OT Time Calculation (min): 29 min  OT Assessment / Plan / Recommendation  OT comments  Pt progressing towards goals. Pt practiced LB dressing and toilet transfer as well as grooming at sink.   Follow Up Recommendations  Home health OT;Supervision/Assistance - 24 hour    Barriers to Discharge       Equipment Recommendations  3 in 1 bedside comode;Tub/shower bench    Recommendations for Other Services    Frequency Min 2X/week   Progress towards OT Goals Progress towards OT goals: Progressing toward goals  Plan Discharge plan remains appropriate    Precautions / Restrictions Precautions Precautions: Knee Restrictions Weight Bearing Restrictions: Yes RLE Weight Bearing: Weight bearing as tolerated   Pertinent Vitals/Pain Pain 6/10. Repositioned. Increased activity.    ADL  Grooming: Performed;Wash/dry hands;Supervision/safety Where Assessed - Grooming: Supported standing Lower Body Dressing: Min guard Where Assessed - Lower Body Dressing: Unsupported sitting Toilet Transfer: Performed;Minimal assistance Toilet Transfer Method: Sit to stand Toilet Transfer Equipment: Raised toilet seat with arms (or 3-in-1 over toilet) Equipment Used: Gait belt;Rolling walker Transfers/Ambulation Related to ADLs: Min A/Minguard for transfers. Minguard for ambulation ADL Comments: Pt performed toilet transfer at Minguard level. Pt washed hands at sink. Pt able to doff right sock but unable to don it. Pt and family state that family will be assisting with LB ADLs. Discussed tub and toilet transfer as pt's son states that the walker will not fit in bathroom to allow to back up to tub or toilet. Recommended sponge bathing and using 3 in 1 in another room until she progresses.     OT Diagnosis:    OT Problem List:   OT Treatment Interventions:     OT  Goals(current goals can now be found in the care plan section) Acute Rehab OT Goals Patient Stated Goal: to go to son's house at d/c OT Goal Formulation: With patient Time For Goal Achievement: 06/17/13 Potential to Achieve Goals: Good ADL Goals Pt Will Perform Lower Body Bathing: with adaptive equipment;with min guard assist;sit to/from stand Pt Will Perform Lower Body Dressing: with min guard assist;with adaptive equipment;sit to/from stand Pt Will Transfer to Toilet: with min guard assist;ambulating;bedside commode Pt Will Perform Toileting - Clothing Manipulation and hygiene: with supervision;with caregiver independent in assisting;sitting/lateral leans Pt Will Perform Tub/Shower Transfer: with min assist;with caregiver independent in assisting;ambulating;tub bench  Visit Information  Last OT Received On: 06/06/13 Assistance Needed: +1 History of Present Illness: Pt. admitted with R knee DJD  and underwent R TKA    Subjective Data      Prior Functioning       Cognition  Cognition Arousal/Alertness: Awake/alert Behavior During Therapy: WFL for tasks assessed/performed Overall Cognitive Status: Within Functional Limits for tasks assessed    Mobility  Bed Mobility Bed Mobility: Supine to Sit Supine to Sit: 4: Min guard;With rails;HOB flat Details for Bed Mobility Assistance: Minguard for safety Transfers Transfers: Sit to Stand;Stand to Sit Sit to Stand: With upper extremity assist;4: Min assist;From bed;4: Min guard;From chair/3-in-1 Stand to Sit: 4: Min guard;With upper extremity assist;To chair/3-in-1 Details for Transfer Assistance: Physical assist to stand from bed. Vc's for hand placement.       Balance     End of Session OT - End of Session Equipment Utilized During Treatment: Gait belt;Rolling walker Activity Tolerance: Patient tolerated treatment well Patient left: in chair;with family/visitor present CPM Right Knee  CPM Right Knee: On Right Knee Flexion  (Degrees): 65 Right Knee Extension (Degrees): 0  GO     Hannah Galvan OTR/L [phone] 06/06/2013, 4:58 PM

## 2013-06-06 NOTE — Progress Notes (Signed)
Physical Therapy Treatment Patient Details Name: Hannah Galvan MRN: [id number] DOB: [birth year] Today's Date: 06/06/2013 Time: 4098-1191 PT Time Calculation (min): 31 min  PT Assessment / Plan / Recommendation  PT Comments   Pt is making progress toward PT goals. Pt is generally steady when ambulating with RW. She required 3 standing rest breaks to walk ~220'.PT will continue to follow.  Follow Up Recommendations  Home health PT;Supervision/Assistance - 24 hour     Does the patient have the potential to tolerate intense rehabilitation     Barriers to Discharge        Equipment Recommendations  None recommended by PT    Recommendations for Other Services    Frequency 7X/week   Progress towards PT Goals Progress towards PT goals: Progressing toward goals  Plan Current plan remains appropriate    Precautions / Restrictions Precautions Precautions: Knee Restrictions Weight Bearing Restrictions: Yes RLE Weight Bearing: Weight bearing as tolerated   Pertinent Vitals/Pain Patient reported her pain in her RLE as 4/10.    Mobility  Bed Mobility Bed Mobility: Sit to Supine Sit to Supine: 4: Min assist Details for Bed Mobility Assistance: VC for hand placement and use of UE  Transfers Transfers: Sit to Stand;Stand to Sit Sit to Stand: 4: Min assist;With upper extremity assist;With armrests;From chair/3-in-1 Stand to Sit: 4: Min guard;To bed Details for Transfer Assistance: min assist to stand and extend trunk.  Ambulation/Gait Ambulation/Gait Assistance: 4: Min guard Ambulation Distance (Feet): 220 Feet Assistive device: Rolling walker Ambulation/Gait Assistance Details: VC for posture. pt ambulates with excessive trunk flexion. Gait Pattern: Step-through pattern;Decreased stride length Gait velocity: decreased    Exercises Total Joint Exercises Ankle Circles/Pumps: AROM;Both;10 reps Hip ABduction/ADduction: AAROM;Right;10 reps General Exercises - Lower Extremity Long Arc  Quad: AAROM;Right;10 reps;Seated Straight Leg Raises: AAROM;Right;10 reps;Seated Hip Flexion/Marching: AROM;Right;10 reps;Seated     PT Goals (current goals can now be found in the care plan section) Acute Rehab PT Goals Time For Goal Achievement: 06/10/13 Potential to Achieve Goals: Good  Visit Information  Last PT Received On: 06/06/13 Assistance Needed: +1 History of Present Illness: Pt. admitted with R knee DJD  and underwent R TKA    Subjective Data      Cognition  Cognition Arousal/Alertness: Awake/alert Behavior During Therapy: WFL for tasks assessed/performed Overall Cognitive Status: Within Functional Limits for tasks assessed    Balance     End of Session PT - End of Session Equipment Utilized During Treatment: Gait belt Activity Tolerance: Patient tolerated treatment well Patient left: in bed;with call bell/phone within reach Nurse Communication: Mobility status   GP     Jolyn Nap, SPTA 06/06/2013, 10:20 AM

## 2013-06-06 NOTE — Progress Notes (Signed)
Physical Therapy Treatment Patient Details Name: Hannah Galvan MRN: [id number] DOB: Nov 19, 1931 Today's Date: 06/06/2013 Time: 4098-1191 PT Time Calculation (min): 43 min  PT Assessment / Plan / Recommendation  PT Comments   Pt continues to make progress. Decreased assistance needed during transfers (sit-->stand) this Tx. PT will continue to follow.  Follow Up Recommendations  Home health PT;Supervision/Assistance - 24 hour     Does the patient have the potential to tolerate intense rehabilitation     Barriers to Discharge        Equipment Recommendations  None recommended by PT    Recommendations for Other Services    Frequency 7X/week   Progress towards PT Goals Progress towards PT goals: Progressing toward goals  Plan Current plan remains appropriate    Precautions / Restrictions Precautions Precautions: Knee Restrictions Weight Bearing Restrictions: Yes RLE Weight Bearing: Weight bearing as tolerated   Pertinent Vitals/Pain Pt reported the pain in her right knee as a 8/10.    Mobility  Bed Mobility Bed Mobility: Sit to Supine Sit to Supine: 4: Min assist Details for Bed Mobility Assistance: VC for hand placement and use of UE  Transfers Transfers: Sit to Stand;Stand to Sit Sit to Stand: 4: Min guard;With upper extremity assist;With armrests;From chair/3-in-1 Stand to Sit: 4: Min guard;To bed;To chair/3-in-1;With upper extremity assist;With armrests Details for Transfer Assistance: guarding for safety. VC to extend trunk and stand tall Ambulation/Gait Ambulation/Gait Assistance: 4: Min guard Ambulation Distance (Feet): 180 Feet Assistive device: Rolling walker Ambulation/Gait Assistance Details: VC posture Gait Pattern: Step-through pattern;Decreased stride length Gait velocity: decreased Stairs: Yes Stairs Assistance: 4: Min assist Stairs Assistance Details (indicate cue type and reason): sequencing for up with the left and down with the right. Stair  Management Technique: Two rails Number of Stairs: 15 (3x5)    Exercises Total Joint Exercises Ankle Circles/Pumps: AROM;Both;10 reps Quad Sets: AROM;Right;10 reps Short Arc Quad: AROM;Right;10 reps Hip ABduction/ADduction: AAROM;Right;10 reps General Exercises - Lower Extremity Long Arc Quad: AAROM;Right;10 reps;Seated Straight Leg Raises: AAROM;Right;10 reps;Seated Hip Flexion/Marching: AROM;Right;10 reps;Seated      PT Goals (current goals can now be found in the care plan section) Acute Rehab PT Goals Time For Goal Achievement: 06/10/13 Potential to Achieve Goals: Good  Visit Information  Last PT Received On: 06/06/13 Assistance Needed: +1 History of Present Illness: Pt. admitted with R knee DJD  and underwent R TKA    Subjective Data      Cognition  Cognition Arousal/Alertness: Awake/alert Behavior During Therapy: WFL for tasks assessed/performed Overall Cognitive Status: Within Functional Limits for tasks assessed    Balance     End of Session PT - End of Session Equipment Utilized During Treatment: Gait belt Activity Tolerance: Patient tolerated treatment well Patient left: in bed;with call bell/phone within reach Nurse Communication: Mobility status CPM Right Knee CPM Right Knee: On Right Knee Flexion (Degrees): 65 Right Knee Extension (Degrees): 0   GP     Jolyn Nap, SPTA 06/06/2013, 1:53 PM

## 2013-06-06 NOTE — Progress Notes (Signed)
Seen and agreed 06/06/2013 Tiara Maultsby Elizabeth PTA [phone] pager [phone] office    

## 2013-06-06 NOTE — Progress Notes (Signed)
Subjective: 4 Days Post-Op Procedure(s) (LRB): RIGHT TOTAL KNEE ARTHROPLASTY (Right) Mental status back to normal. Foley catheter still in but coming out today. Activity level:  Weightbearing as tolerated Diet tolerance:  Regular diet Voiding:  Foley catheter to come out today. Patient reports pain as 2 on 0-10 scale.    Objective: Vital signs in last 24 hours: Temp:  [98.7 F (37.1 C)-100.3 F (37.9 C)] 98.7 F (37.1 C) (07/14 0651) Pulse Rate:  [86-102] 96 (07/14 0651) Resp:  [16] 16 (07/14 0651) BP: (141-145)/(57-63) 145/63 mmHg (07/13 2136) SpO2:  [96 %-100 %] 96 % (07/14 0651)  Labs:  Recent Labs  06/04/13 0515 06/04/13 2310  HGB 9.9* 10.8*    Recent Labs  06/04/13 0515 06/04/13 2310  WBC 10.4 10.3  RBC 3.11* 3.39*  HCT 28.3* 30.2*  PLT 148* 169    Recent Labs  06/05/13 0948 06/06/13 0625  NA 136 137  K 2.7* 3.7  CL 97 104  CO2 28 24  BUN 8 9  CREATININE 0.73 0.66  GLUCOSE 108* 102*  CALCIUM 8.9 8.8   No results found for this basename: LABPT, INR,  in the last 72 hours  Physical Exam:  Neurologically intact ABD soft Neurovascular intact Sensation intact distally Intact pulses distally Dorsiflexion/Plantar flexion intact No cellulitis present Compartment soft  Assessment/Plan:  4 Days Post-Op Procedure(s) (LRB): RIGHT TOTAL KNEE ARTHROPLASTY (Right) Advance diet Up with therapy Plan for discharge tomorrow Foley catheter coming out today if patient voids she can be discharged this afternoon or tomorrow. Plans are to go home with son and daughter-in-law. The  ASA 325 twice a day for 2 weeks. Dr. Evlyn Kanner patient this morning and agrees with this plan.    Issacc Merlo R 06/06/2013, 9:15 AM

## 2013-06-07 LAB — BASIC METABOLIC PANEL
BUN: 9 mg/dL (ref 6–23)
CO2: 25 mEq/L (ref 19–32)
Calcium: 8.8 mg/dL (ref 8.4–10.5)
GFR calc non Af Amer: 83 mL/min — ABNORMAL LOW (ref >90)
Glucose, Bld: 104 mg/dL — ABNORMAL HIGH (ref 70–99)
Potassium: 3.8 mEq/L (ref 3.5–5.1)

## 2013-06-07 NOTE — Progress Notes (Signed)
Subjective: Feels well. Up walking with therapy on stairs. Pain is under control. No bladder issues  Objective: Vital signs in last 24 hours: Temp:  [98.1 F (36.7 C)-98.6 F (37 C)] 98.6 F (37 C) (07/15 0546) Pulse Rate:  [84-95] 84 (07/15 0546) Resp:  [18] 18 (07/15 0546) BP: (135-148)/(51-67) 138/60 mmHg (07/15 0546) SpO2:  [95 %-97 %] 97 % (07/15 0546)  Intake/Output from previous day: 07/14 0701 - 07/15 0700 In: 1680 [P.O.:1680] Out: 1200 [Urine:1200] Intake/Output this shift:    Up with walker, moving well. mentating fine.   Lab Results   Recent Labs  06/04/13 2310  WBC 10.3  RBC 3.39*  HGB 10.8*  HCT 30.2*  MCV 89.1  MCH 31.9  RDW 12.8  PLT 169    Recent Labs  06/06/13 0625 06/07/13 0520  NA 137 135  K 3.7 3.8  CL 104 100  CO2 24 25  GLUCOSE 102* 104*  BUN 9 9  CREATININE 0.66 0.62  CALCIUM 8.8 8.8    Studies/Results: No results found.  Scheduled Meds: . aspirin EC  325 mg Oral BID  . atorvastatin  20 mg Oral q1800  . calcium-vitamin D  1 tablet Oral BID  . docusate sodium  100 mg Oral BID  . escitalopram  5 mg Oral Daily  . exemestane  25 mg Oral QPC breakfast  . ferrous sulfate  325 mg Oral Q breakfast  . folic acid  1 mg Oral Daily  . hydrocortisone cream   Topical BID  . levothyroxine  125 mcg Oral QAC breakfast  . LORazepam  1 mg Oral QHS  . losartan  50 mg Oral Daily  . magic mouthwash  5 mL Oral QID  . tamsulosin  0.4 mg Oral Daily  . vitamin B-12  1,000 mcg Oral Daily   Continuous Infusions: . sodium chloride 75 mL/hr (06/05/13 0915)   PRN Meds:acetaminophen, acetaminophen, alum & mag hydroxide-simeth, bisacodyl, diphenhydrAMINE, HYDROcodone-acetaminophen, HYDROmorphone (DILAUDID) injection, menthol-cetylpyridinium, methocarbamol (ROBAXIN) IV, methocarbamol, ondansetron (ZOFRAN) IV, ondansetron, phenol  Assessment/Plan:  DELIRIUM: resolved , no recurrence HYPONATREMIA: resolved , staying fine. Stay off HCTZ HYPOKALEMIA:  resolved  ORAL SORENESS:  magic mouthwash, send home with 5 days worth HYPERTENSION: BP improved  HYPOTHYROID: TSH OK BLADDER ISSUES: resolved, doing well after foley removal DERMATITIS: sl better with HC cream, needs several days Rx OK for D/C from my perspective   LOS: 5 days   Kymari Lollis ALAN 06/07/2013, 7:57 AM

## 2013-06-07 NOTE — Discharge Summary (Signed)
Patient ID: Hannah Galvan MRN: [id number] DOB/AGE: September 10, 1931 79 y.o.  Admit date: 06/02/2013 Discharge date: 06/07/2013  Admission Diagnoses:  Principal Problem:   Left knee DJD Active Problems:   Hypertension   Acute delirium   Hyponatremia   Acute urinary retention   Hypokalemia   Fever, unspecified   hypothyroidism   Discharge Diagnoses:  Same  Past Medical History  Diagnosis Date  . Thyroid disease   . Hypertension   . Hyperlipidemia   . Arthritis   . Cancer     breast, s/p RXT, bilateral  . Hypothyroidism   . Shortness of breath     Surgeries: Procedure(s): RIGHT TOTAL KNEE ARTHROPLASTY on 06/02/2013   Consultants: Treatment Team:  Julian Hy, MD  Discharged Condition: Improved  Hospital Course: Hannah Galvan is an 79 y.o. female who was admitted 06/02/2013 for operative treatment ofLeft knee DJD. Patient has severe unremitting pain that affects sleep, daily activities, and work/hobbies. After pre-op clearance the patient was taken to the operating room on 06/02/2013 and underwent  Procedure(s): RIGHT TOTAL KNEE ARTHROPLASTY.  During her hospital stay she did have acute delirium, hyponatremia, hypokalemia and urinary retention. Dr. Evlyn Kanner saw the patient in the hospital to rectify these issues.  Patient was given perioperative antibiotics: Anti-infectives   Start     Dose/Rate Route Frequency Ordered Stop   06/02/13 2000  ceFAZolin (ANCEF) IVPB 2 g/50 mL premix     2 g 100 mL/hr over 30 Minutes Intravenous Every 6 hours 06/02/13 1904 06/03/13 0037   06/02/13 0600  ceFAZolin (ANCEF) IVPB 2 g/50 mL premix     2 g 100 mL/hr over 30 Minutes Intravenous On call to O.R. 06/01/13 1441 06/02/13 1346       Patient was given sequential compression devices, early ambulation, and chemoprophylaxis to prevent DVT. A [facility] consult was done for delirium, electrolytes disturbance and urinary retention. These issues were rectified by Dr. Evlyn Kanner.  Patient will followup with Dr. Evlyn Kanner per his request.  Patient benefited maximally from hospital stay and there were no complications.    Recent vital signs: Patient Vitals for the past 24 hrs:  BP Temp Temp src Pulse Resp SpO2  06/07/13 0546 138/60 mmHg 98.6 F (37 C) Oral 84 18 97 %  06/06/13 2022 135/51 mmHg 98.4 F (36.9 C) Oral 91 18 96 %  06/06/13 1405 148/67 mmHg 98.1 F (36.7 C) - 95 18 95 %     Recent laboratory studies:  Recent Labs  06/04/13 2310  06/06/13 0625 06/07/13 0520  WBC 10.3  --   --   --   HGB 10.8*  --   --   --   HCT 30.2*  --   --   --   PLT 169  --   --   --   NA 124*  < > 137 135  K 2.8*  < > 3.7 3.8  CL 89*  < > 104 100  CO2 28  < > 24 25  BUN 7  < > 9 9  CREATININE 0.66  < > 0.66 0.62  GLUCOSE 123*  < > 102* 104*  CALCIUM 9.2  < > 8.8 8.8  < > = values in this interval not displayed.   Discharge Medications:     Medication List    STOP taking these medications       aspirin 81 MG tablet  Replaced by:  aspirin 325 MG EC tablet  TAKE these medications       aspirin 325 MG EC tablet  Take 1 tablet (325 mg total) by mouth 2 (two) times daily.     CALCIUM + D 600-200 MG-UNIT Tabs  Generic drug:  Calcium Carbonate-Vitamin D  Take 1 tablet by mouth 2 (two) times daily.     escitalopram 10 MG tablet  Commonly known as:  LEXAPRO     exemestane 25 MG tablet  Commonly known as:  AROMASIN  Take 25 mg by mouth daily after breakfast.     folic acid 1 MG tablet  Commonly known as:  FOLVITE  Take 1 tablet (1 mg total) by mouth daily.     HYDROcodone-acetaminophen 5-325 MG per tablet  Commonly known as:  NORCO/VICODIN  Take 1 tablet by mouth every 8 (eight) hours as needed.     levothyroxine 125 MCG tablet  Commonly known as:  SYNTHROID  Take 1 tablet (125 mcg total) by mouth daily.     LORazepam 1 MG tablet  Commonly known as:  ATIVAN  Take 1 mg by mouth at bedtime.     losartan-hydrochlorothiazide 100-25 MG per tablet   Commonly known as:  HYZAAR  Take 0.5 tablets by mouth daily.     magic mouthwash Soln  Take 5 mLs by mouth 4 (four) times daily.     ondansetron 8 MG disintegrating tablet  Commonly known as:  ZOFRAN-ODT  Take 8 mg by mouth as needed for nausea.     potassium chloride SA 20 MEQ tablet  Commonly known as:  K-DUR,KLOR-CON  Take 1 tablet (20 mEq total) by mouth daily.     rosuvastatin 10 MG tablet  Commonly known as:  CRESTOR  Take 5 mg by mouth daily.     Vitamin B-12 5000 MCG Subl  Take 1 tablet by mouth daily.     zolpidem 5 MG tablet  Commonly known as:  AMBIEN  Take 5 mg by mouth at bedtime as needed.        Diagnostic Studies: Dg Chest 2 View  06/04/2013   *RADIOLOGY REPORT*  Clinical Data: Pneumonia.  Hypertension.  CHEST - 2 VIEW  Comparison: 05/24/2013.  Findings: Normal sized heart.  Interval linear density in the right inferior perihilar region.  Interval minimal patchy opacity at the left lateral lung base with possible small left pleural effusion. Stable right axillary surgical clips.  Mild thoracic spine degenerative changes.  IMPRESSION:  1.  Interval right perihilar atelectasis. 2.  Interval minimal patchy atelectasis or pneumonia at the left lateral lung base with a probable small left pleural effusion.   Original Report Authenticated By: Beckie Salts, M.D.   Dg Chest 2 View  05/24/2013   *RADIOLOGY REPORT*  Clinical Data: Preoperative study, history of hypertension  CHEST - 2 VIEW  Comparison: 02/12/2012  Findings: Heart size and vascular pattern are normal.  Lungs are clear.  IMPRESSION: No acute findings   Original Report Authenticated By: Esperanza Heir, M.D.    Disposition: 01-Home or Self Care       Future Appointments Provider Department Dept Phone   11/25/2013 2:30 PM Rachael Fee [facility] [phone]   11/25/2013 3:00 PM Josph Macho, MD [facility] [phone]      Follow-up  Information   Follow up with DALLDORF,PETER G, MD. Call in 2 weeks.   Contact information:   [address] [place] [address] [phone]  Signed: Miklo Aken R 06/07/2013, 10:01 AM

## 2013-06-07 NOTE — Progress Notes (Signed)
Subjective: 5 Days Post-Op Procedure(s) (LRB): RIGHT TOTAL KNEE ARTHROPLASTY (Right) Voiding on her own no problem. Pain under good control with occasional pain pill. She is ready to go home today. Activity level:  Out of bed weightbearing as tolerated Diet tolerance:  Eating regular diet Voiding:  Voiding without difficulty Patient reports pain as 2 on 0-10 scale.    Objective: Vital signs in last 24 hours: Temp:  [98.1 F (36.7 C)-98.6 F (37 C)] 98.6 F (37 C) (07/15 0546) Pulse Rate:  [84-95] 84 (07/15 0546) Resp:  [18] 18 (07/15 0800) BP: (135-148)/(51-67) 138/60 mmHg (07/15 0546) SpO2:  [95 %-97 %] 97 % (07/15 0800)  Labs:  Recent Labs  06/04/13 2310  HGB 10.8*    Recent Labs  06/04/13 2310  WBC 10.3  RBC 3.39*  HCT 30.2*  PLT 169    Recent Labs  06/06/13 0625 06/07/13 0520  NA 137 135  K 3.7 3.8  CL 104 100  CO2 24 25  BUN 9 9  CREATININE 0.66 0.62  GLUCOSE 102* 104*  CALCIUM 8.8 8.8   No results found for this basename: LABPT, INR,  in the last 72 hours  Physical Exam:  Neurologically intact ABD soft Neurovascular intact Sensation intact distally Intact pulses distally Dorsiflexion/Plantar flexion intact Incision: dressing C/D/I No cellulitis present Compartment soft  Assessment/Plan:  5 Days Post-Op Procedure(s) (LRB): RIGHT TOTAL KNEE ARTHROPLASTY (Right) Advance diet Up with therapy Discharge home with home health ASA 325. 1 pill twice a day for 2 weeks. May be weightbearing as tolerated. Return to office 2 weeks from date of surgery.  going home with his son and daughter-in-law. Followup with Dr. Evlyn Kanner per his request.    Prince Rome 06/07/2013, 12:31 PM

## 2013-06-07 NOTE — Progress Notes (Signed)
Physical Therapy Treatment Patient Details Name: Hannah Galvan MRN: [id number] DOB: December 10, 1930 Today's Date: 06/07/2013 Time: 4696-2952 PT Time Calculation (min): 29 min  PT Assessment / Plan / Recommendation  PT Comments   Pt has progressed well toward PT goals. Pt is generally steady using RW on level surface and ambulating stairs with two rails. Patient will benefit from HHPT to increase her mobility and functional independence. Pt is safe to d/c when medically ready.  Follow Up Recommendations  Home health PT;Supervision/Assistance - 24 hour     Does the patient have the potential to tolerate intense rehabilitation     Barriers to Discharge        Equipment Recommendations  None recommended by PT    Recommendations for Other Services    Frequency 7X/week   Progress towards PT Goals Progress towards PT goals: Progressing toward goals  Plan Current plan remains appropriate    Precautions / Restrictions Precautions Precautions: Knee Restrictions Weight Bearing Restrictions: Yes RLE Weight Bearing: Weight bearing as tolerated   Pertinent Vitals/Pain Patient reported her pain in the right knee as a 7/10.    Mobility  Bed Mobility Bed Mobility: Supine to Sit;Sitting - Scoot to Edge of Bed Supine to Sit: 4: Min guard;With rails;HOB flat Sitting - Scoot to Edge of Bed: 5: Supervision Details for Bed Mobility Assistance: Minguard for safety Transfers Transfers: Sit to Stand;Stand to Sit Sit to Stand: 4: Min guard;With upper extremity assist;From bed;From chair/3-in-1 Stand to Sit: 5: Supervision;With upper extremity assist;With armrests;To chair/3-in-1 Details for Transfer Assistance: Guarding for safety Ambulation/Gait Ambulation/Gait Assistance: 4: Min guard Ambulation Distance (Feet): 180 Feet Assistive device: Rolling walker Ambulation/Gait Assistance Details: VC for posture and RW proximity Gait Pattern: Step-through pattern;Decreased stride length Gait velocity:  decreased Stairs: Yes Stairs Assistance: 4: Min guard Stairs Assistance Details (indicate cue type and reason): Patient independent with sequencing Stair Management Technique: Two rails Number of Stairs: 15 (3x5)    Exercises Other Exercises Other Exercises: Gave patient HEP and discussed exercises with pt.      PT Goals (current goals can now be found in the care plan section) Acute Rehab PT Goals Time For Goal Achievement: 06/10/13 Potential to Achieve Goals: Good  Visit Information  Last PT Received On: 06/07/13 Assistance Needed: +1 History of Present Illness: Pt. admitted with R knee DJD  and underwent R TKA    Subjective Data      Cognition  Cognition Arousal/Alertness: Awake/alert Behavior During Therapy: WFL for tasks assessed/performed Overall Cognitive Status: Within Functional Limits for tasks assessed    Balance     End of Session PT - End of Session Equipment Utilized During Treatment: Gait belt Activity Tolerance: Patient tolerated treatment well Patient left: in chair;with call bell/phone within reach Nurse Communication: Mobility status   GP     Jolyn Nap, SPTA 06/07/2013, 8:16 AM

## 2013-06-07 NOTE — Progress Notes (Signed)
Occupational Therapy Treatment Patient Details Name: Hannah Galvan MRN: [id number] DOB: [birth year] Today's Date: 06/07/2013 Time: 7829-5621 OT Time Calculation (min): 21 min  OT Assessment / Plan / Recommendation  OT comments  Progressing towards goals. Pt planning on d/c home today. Recommend HHOT upon d/c.   Follow Up Recommendations  Home health OT;Supervision/Assistance - 24 hour    Barriers to Discharge       Equipment Recommendations  3 in 1 bedside comode    Recommendations for Other Services    Frequency Min 2X/week   Progress towards OT Goals Progress towards OT goals: Progressing toward goals  Plan Discharge plan remains appropriate    Precautions / Restrictions Precautions Precautions: Knee Restrictions Weight Bearing Restrictions: Yes RLE Weight Bearing: Weight bearing as tolerated   Pertinent Vitals/Pain Pain 7/10. Repositioned.     ADL  Eating/Feeding: Performed;Independent Where Assessed - Eating/Feeding: Chair Grooming: Performed;Wash/dry hands;Min guard Where Assessed - Grooming: Unsupported standing Lower Body Dressing: Minimal assistance Where Assessed - Lower Body Dressing: Supported sit to Pharmacist, hospital: Performed;Min Pension scheme manager Method: Sit to Barista: Raised toilet seat with arms (or 3-in-1 over toilet) Toileting - Clothing Manipulation and Hygiene: Min guard Where Assessed - Toileting Clothing Manipulation and Hygiene: Sit to stand from 3-in-1 or toilet Equipment Used: Gait belt;Rolling walker Transfers/Ambulation Related to ADLs: Minguard  ADL Comments: Pt unable to fully doff right sock. Educated benefit of reaching down to attempt to increase ROM in knee. Pt verbalized that daughter in law is going to be assisting her with LB ADLs, so not interested in AE. Pt overall Min A level for LB ADLs with sit to stand transfer. Educated to have bed or chair behind her and walker in front when pulling up  pants/underwear with daughter in law with her. Educated pt to have daughter in law with her all the time. OT recommended sponge bathing due to walker not fitting in bathroom to back to tub. Also, educated pt to use 3 in 1 in a different room due to walker not fitting in bathroom in front of toilet.     OT Diagnosis:    OT Problem List:   OT Treatment Interventions:     OT Goals(current goals can now be found in the care plan section) Acute Rehab OT Goals Patient Stated Goal: to go to son's house at d/c OT Goal Formulation: With patient Time For Goal Achievement: 06/17/13 Potential to Achieve Goals: Good ADL Goals Pt Will Perform Lower Body Bathing: with adaptive equipment;with min guard assist;sit to/from stand Pt Will Perform Lower Body Dressing: with min guard assist;with adaptive equipment;sit to/from stand Pt Will Transfer to Toilet: with min guard assist;ambulating;bedside commode Pt Will Perform Toileting - Clothing Manipulation and hygiene: with supervision;with caregiver independent in assisting;sitting/lateral leans Pt Will Perform Tub/Shower Transfer: with min assist;with caregiver independent in assisting;ambulating;tub bench  Visit Information  Last OT Received On: 06/07/13 Assistance Needed: +1 History of Present Illness: Pt. admitted with R knee DJD  and underwent R TKA    Subjective Data      Prior Functioning       Cognition  Cognition Arousal/Alertness: Awake/alert Behavior During Therapy: WFL for tasks assessed/performed Overall Cognitive Status: Within Functional Limits for tasks assessed    Mobility  Bed Mobility Bed Mobility: Not assessed Transfers Transfers: Sit to Stand;Stand to Sit Sit to Stand: 4: Min guard;With upper extremity assist;From chair/3-in-1 Stand to Sit: 4: Min guard;With upper extremity assist;To chair/3-in-1 Details for Transfer Assistance:  Minguard for safety and cues to control descent to chair.    Exercises      Balance      End of Session OT - End of Session Equipment Utilized During Treatment: Gait belt;Rolling walker Activity Tolerance: Patient tolerated treatment well Patient left: in chair;with call bell/phone within reach;with nursing/sitter in room   GO     [name] OTR/L [phone] 06/07/2013, 1:19 PM

## 2013-06-07 NOTE — Progress Notes (Signed)
Seen and agreed with note 06/07/2013 Fredrich Birks PTA [phone] pager [phone] office

## 2013-06-15 ENCOUNTER — Other Ambulatory Visit: Payer: Medicare Other | Admitting: Lab

## 2013-06-15 ENCOUNTER — Ambulatory Visit: Payer: Medicare Other | Admitting: Hematology & Oncology

## 2013-07-12 IMAGING — CR DG KNEE 1-2V*L*
2 series · 2 of 2 positions shown · non-contrast
Comparison: None.

CLINICAL DATA: Pain and swelling.

LEFT KNEE - 1-2 VIEW

[t knee ap left]
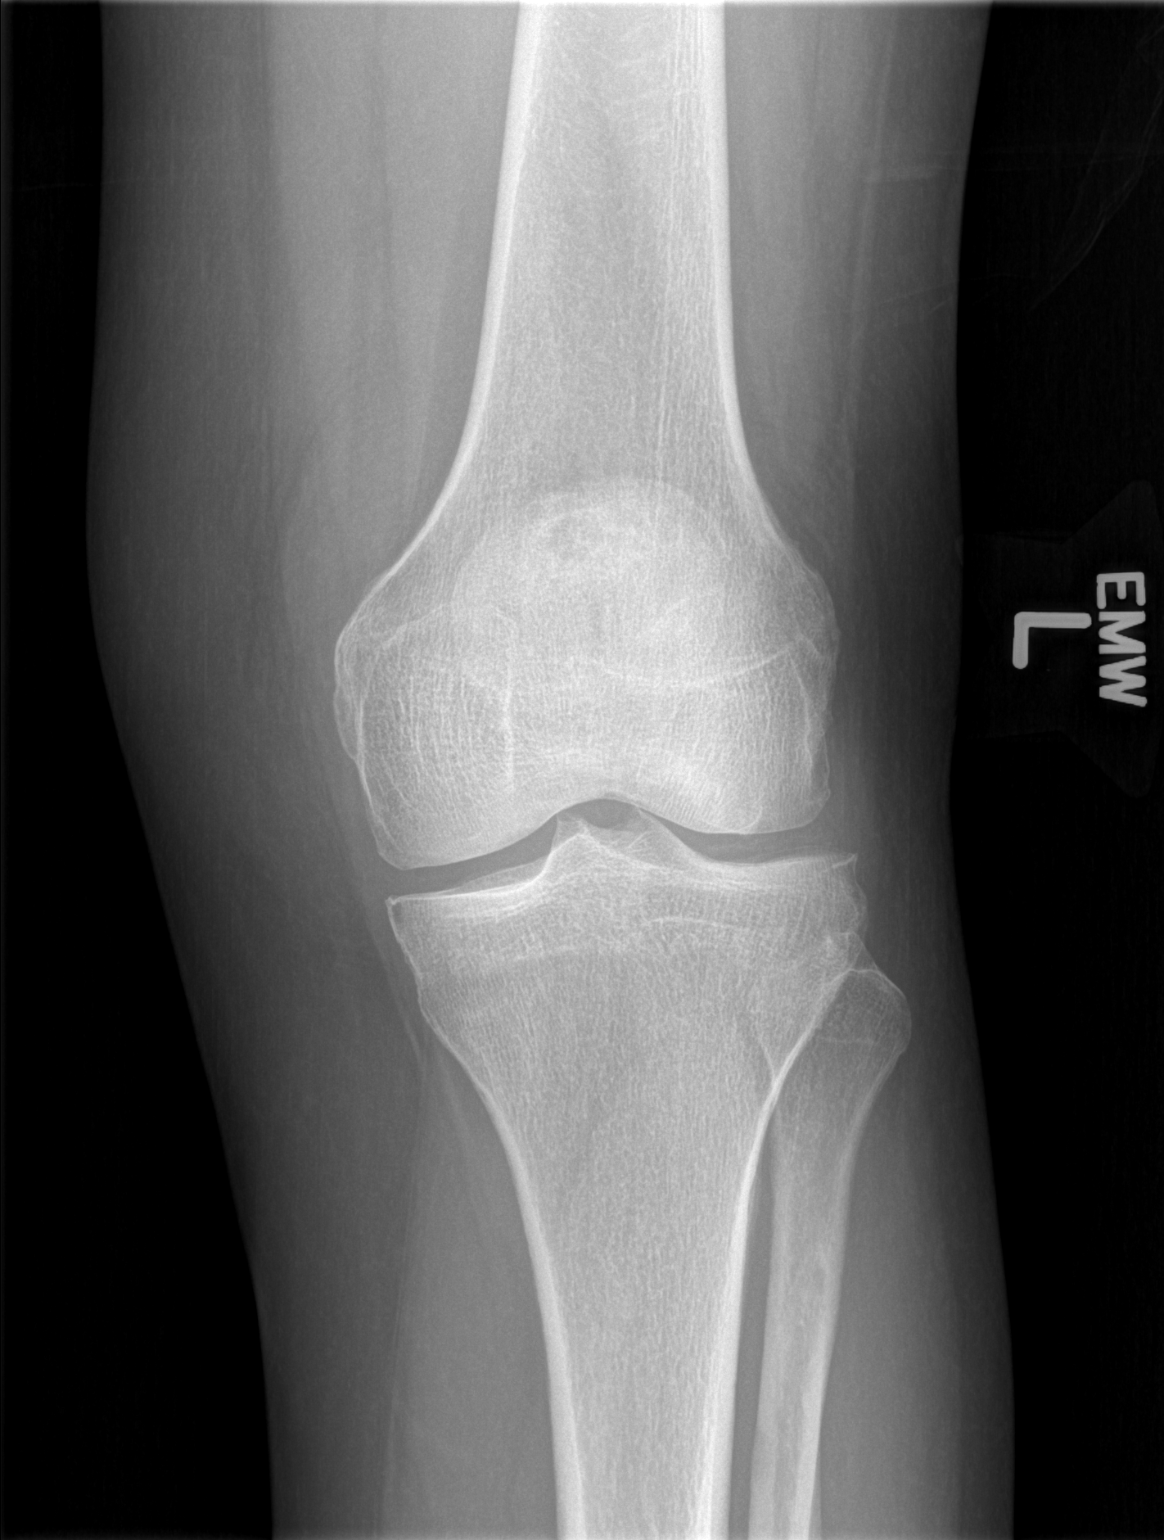

[t knee lat left]
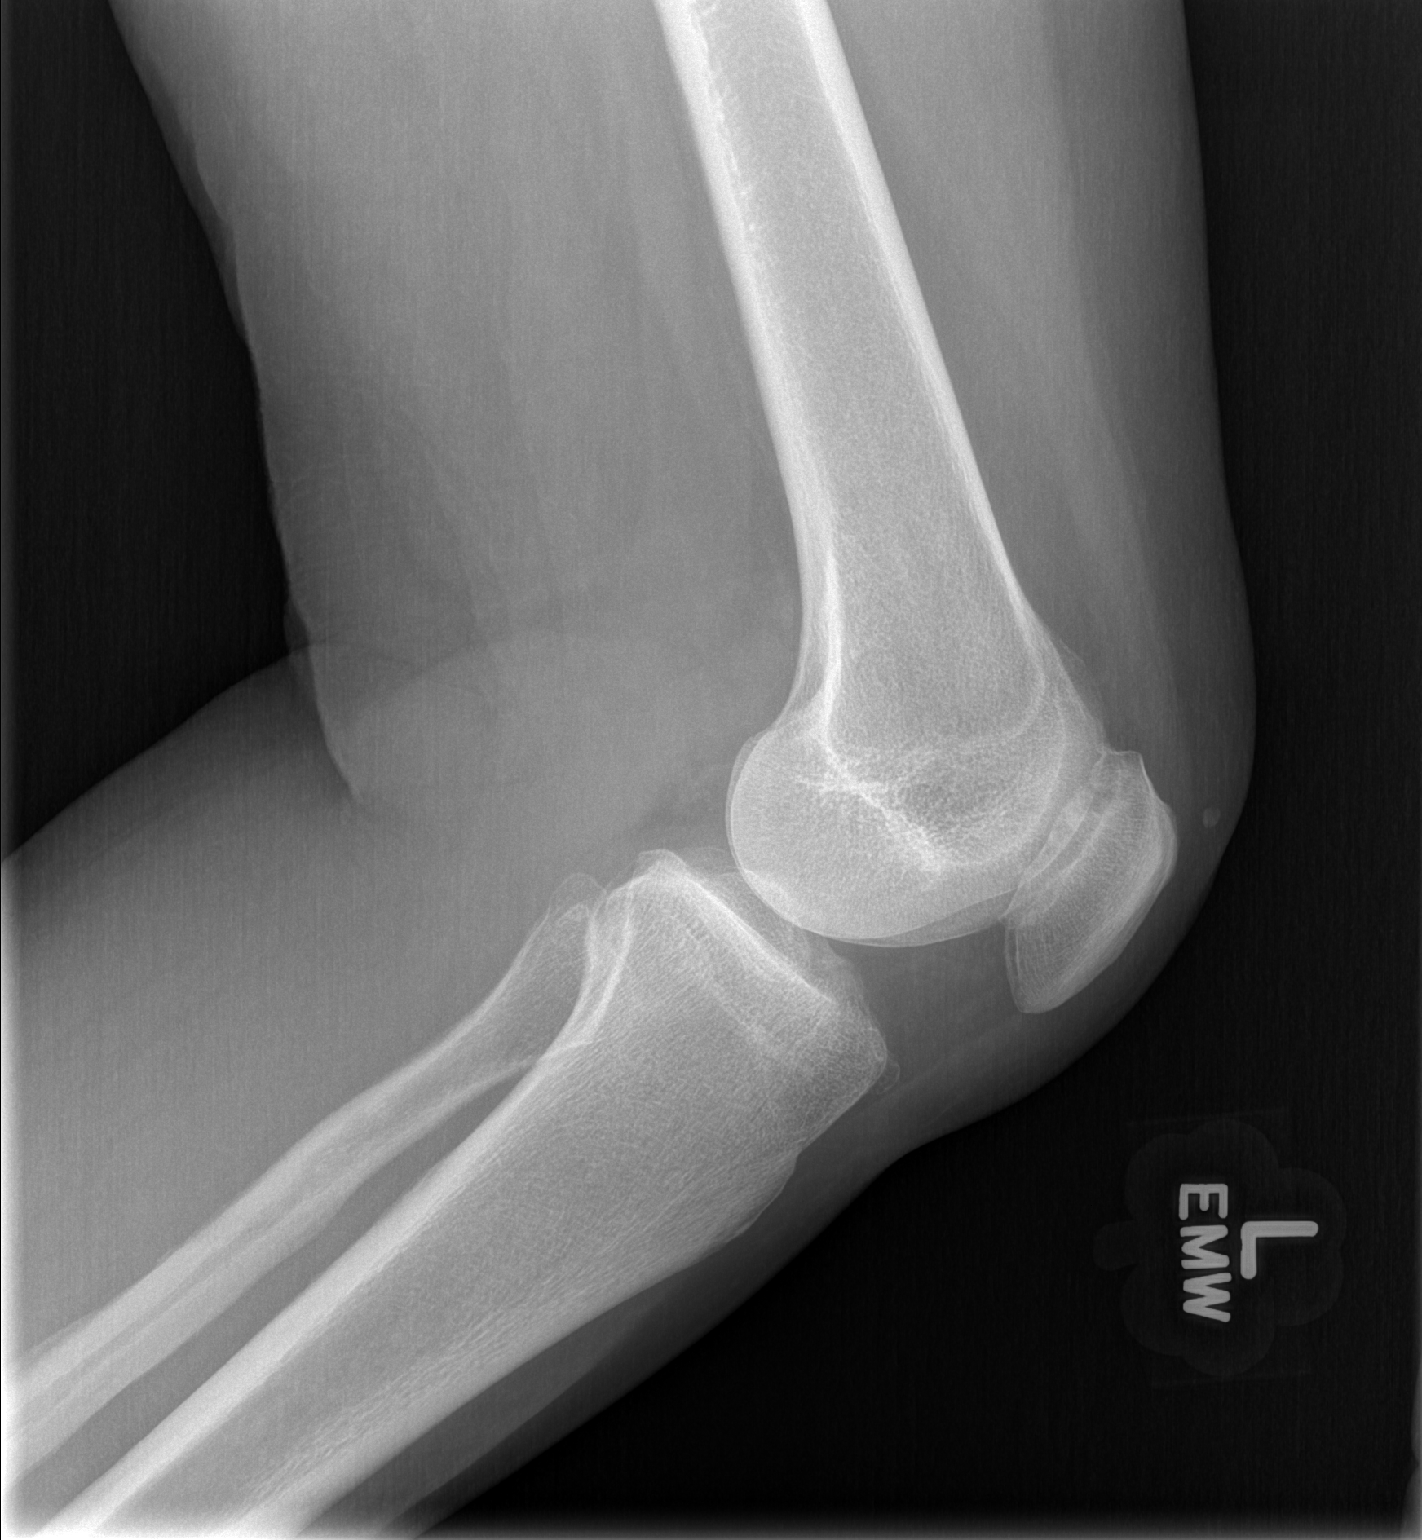

[2 of 2 positions shown; findings below may reference images not displayed]

FINDINGS: Chondrocalcinosis is seen in the medial and lateral
compartments.  Mild patellofemoral osteophytosis.  No joint
effusion or fracture.
IMPRESSION: Mild degenerative changes.  No acute finding.

## 2013-07-12 IMAGING — CR DG KNEE 1-2V*R*
2 series · 2 of 2 positions shown · non-contrast
Comparison: None.

CLINICAL DATA: Pain and swelling.

RIGHT KNEE - 1-2 VIEW

[t knee ap right]
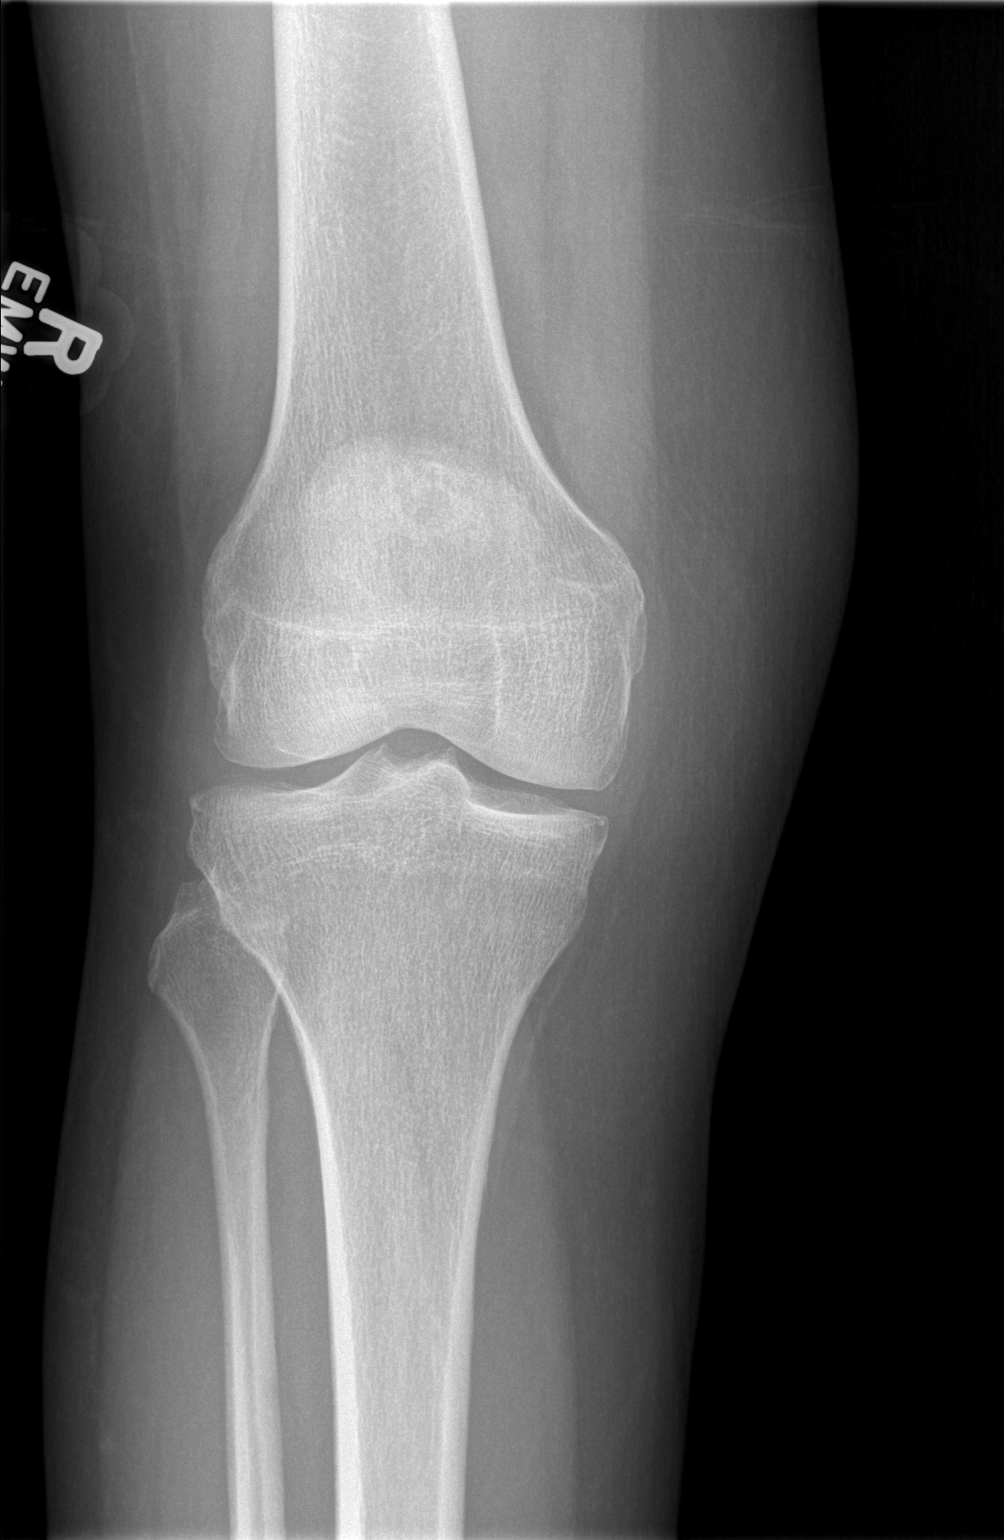

[t knee lat right]
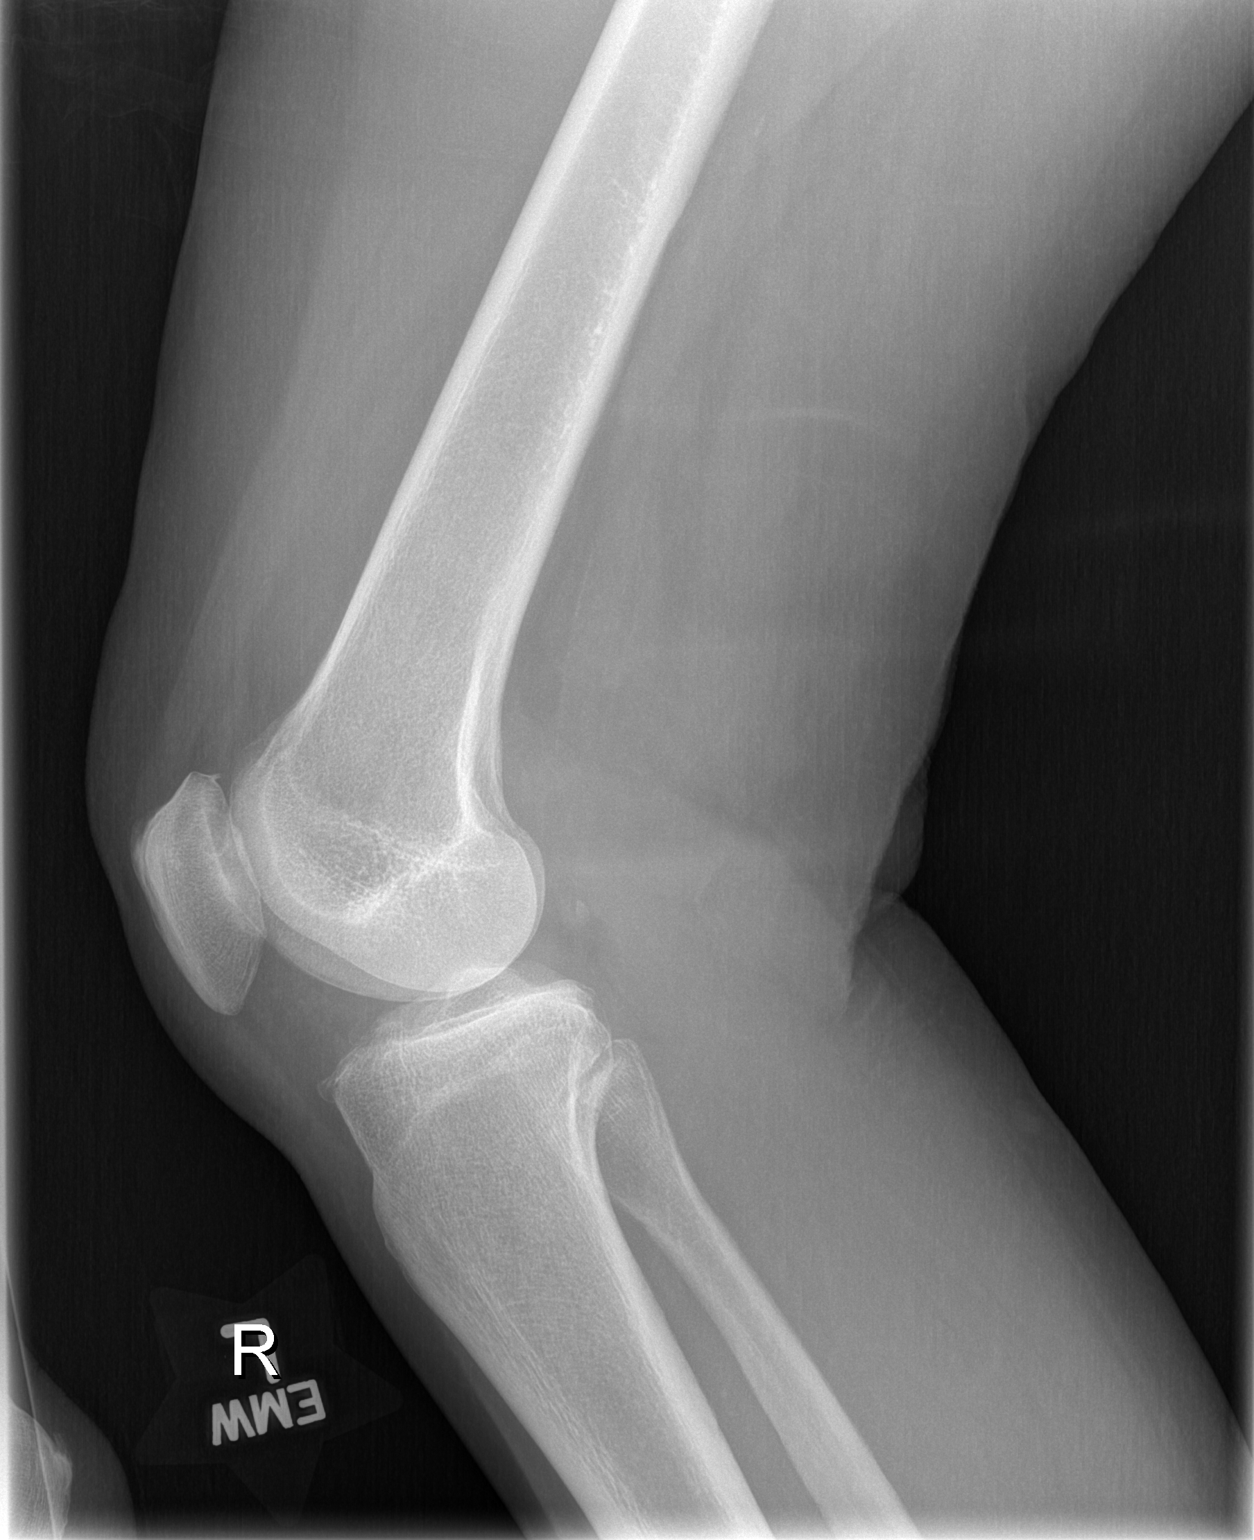

[2 of 2 positions shown; findings below may reference images not displayed]

FINDINGS: There may be chondrocalcinosis in the lateral and medial
compartments.  Mild patellofemoral osteophytosis.  No joint
effusion or fracture.
IMPRESSION: Mild degenerative changes.

## 2013-10-12 ENCOUNTER — Other Ambulatory Visit: Payer: Self-pay

## 2013-10-12 ENCOUNTER — Other Ambulatory Visit: Payer: Self-pay | Admitting: Hematology & Oncology

## 2013-10-12 DIAGNOSIS — Z853 Personal history of malignant neoplasm of breast: Secondary | ICD-10-CM

## 2013-10-12 DIAGNOSIS — Z9889 Other specified postprocedural states: Secondary | ICD-10-CM

## 2013-10-18 ENCOUNTER — Ambulatory Visit (HOSPITAL_COMMUNITY)
Admission: RE | Admit: 2013-10-18 | Discharge: 2013-10-18 | Disposition: A | Discharge status: Home / Self Care | Payer: Medicare Other | Source: Ambulatory Visit | Attending: Orthopedic Surgery | Admitting: Orthopedic Surgery

## 2013-10-18 ENCOUNTER — Other Ambulatory Visit (HOSPITAL_COMMUNITY): Payer: Self-pay | Admitting: Orthopedic Surgery

## 2013-10-18 DIAGNOSIS — M7122 Synovial cyst of popliteal space [Baker], left knee: Secondary | ICD-10-CM

## 2013-10-18 DIAGNOSIS — M25562 Pain in left knee: Secondary | ICD-10-CM

## 2013-10-18 DIAGNOSIS — M79609 Pain in unspecified limb: Secondary | ICD-10-CM

## 2013-10-18 DIAGNOSIS — R600 Localized edema: Secondary | ICD-10-CM

## 2013-10-18 DIAGNOSIS — M7989 Other specified soft tissue disorders: Secondary | ICD-10-CM

## 2013-10-18 DIAGNOSIS — R609 Edema, unspecified: Secondary | ICD-10-CM | POA: Insufficient documentation

## 2013-10-18 NOTE — Progress Notes (Signed)
VASCULAR LAB PRELIMINARY  PRELIMINARY  PRELIMINARY  PRELIMINARY  Left lower extremity venous duplex completed.    Preliminary report:  Left:  No evidence of DVT, superficial thrombosis, or Baker's cyst.  Cobie Leidner, RVS 10/18/2013, 3:01 PM

## 2013-10-19 ENCOUNTER — Other Ambulatory Visit: Payer: Self-pay | Admitting: Dermatology

## 2013-11-15 ENCOUNTER — Ambulatory Visit
Admission: RE | Admit: 2013-11-15 | Discharge: 2013-11-15 | Disposition: A | Discharge status: Home / Self Care | Payer: Medicare Other | Source: Ambulatory Visit | Attending: Hematology & Oncology | Admitting: Hematology & Oncology

## 2013-11-15 DIAGNOSIS — Z9889 Other specified postprocedural states: Secondary | ICD-10-CM

## 2013-11-15 DIAGNOSIS — Z853 Personal history of malignant neoplasm of breast: Secondary | ICD-10-CM

## 2013-11-25 ENCOUNTER — Ambulatory Visit: Payer: Medicare Other | Admitting: Hematology & Oncology

## 2013-11-25 ENCOUNTER — Telehealth: Payer: Self-pay | Admitting: Hematology & Oncology

## 2013-11-25 ENCOUNTER — Other Ambulatory Visit: Payer: Medicare Other | Admitting: Lab

## 2013-11-25 NOTE — Telephone Encounter (Signed)
Left pt message to call and r/s appointments

## 2013-11-28 ENCOUNTER — Encounter (HOSPITAL_COMMUNITY): Payer: Self-pay | Admitting: Emergency Medicine

## 2013-11-28 ENCOUNTER — Emergency Department (HOSPITAL_COMMUNITY)
Admission: EM | Admit: 2013-11-28 | Discharge: 2013-11-28 | Disposition: A | Discharge status: Home / Self Care | Payer: Medicare Other | Attending: Emergency Medicine | Admitting: Emergency Medicine

## 2013-11-28 DIAGNOSIS — M129 Arthropathy, unspecified: Secondary | ICD-10-CM | POA: Insufficient documentation

## 2013-11-28 DIAGNOSIS — E039 Hypothyroidism, unspecified: Secondary | ICD-10-CM | POA: Insufficient documentation

## 2013-11-28 DIAGNOSIS — Z7982 Long term (current) use of aspirin: Secondary | ICD-10-CM | POA: Insufficient documentation

## 2013-11-28 DIAGNOSIS — I10 Essential (primary) hypertension: Secondary | ICD-10-CM | POA: Insufficient documentation

## 2013-11-28 DIAGNOSIS — F411 Generalized anxiety disorder: Secondary | ICD-10-CM | POA: Insufficient documentation

## 2013-11-28 DIAGNOSIS — R002 Palpitations: Secondary | ICD-10-CM | POA: Insufficient documentation

## 2013-11-28 DIAGNOSIS — Z853 Personal history of malignant neoplasm of breast: Secondary | ICD-10-CM | POA: Insufficient documentation

## 2013-11-28 DIAGNOSIS — Z79899 Other long term (current) drug therapy: Secondary | ICD-10-CM | POA: Insufficient documentation

## 2013-11-28 DIAGNOSIS — E785 Hyperlipidemia, unspecified: Secondary | ICD-10-CM | POA: Insufficient documentation

## 2013-11-28 HISTORY — DX: Anxiety disorder, unspecified: F41.9

## 2013-11-28 LAB — URINALYSIS, ROUTINE W REFLEX MICROSCOPIC
BILIRUBIN URINE: NEGATIVE
Glucose, UA: NEGATIVE mg/dL
Hgb urine dipstick: NEGATIVE
KETONES UR: NEGATIVE mg/dL
LEUKOCYTES UA: NEGATIVE
NITRITE: NEGATIVE
PH: 8.5 — AB (ref 5.0–8.0)
Protein, ur: NEGATIVE mg/dL
SPECIFIC GRAVITY, URINE: 1.008 (ref 1.005–1.030)
UROBILINOGEN UA: 0.2 mg/dL (ref 0.0–1.0)

## 2013-11-28 LAB — POCT I-STAT, CHEM 8
BUN: 9 mg/dL (ref 6–23)
CHLORIDE: 97 meq/L (ref 96–112)
CREATININE: 0.9 mg/dL (ref 0.50–1.10)
Calcium, Ion: 1.13 mmol/L (ref 1.13–1.30)
Glucose, Bld: 99 mg/dL (ref 70–99)
HCT: 48 % — ABNORMAL HIGH (ref 36.0–46.0)
Hemoglobin: 16.3 g/dL — ABNORMAL HIGH (ref 12.0–15.0)
Potassium: 3.5 mEq/L — ABNORMAL LOW (ref 3.7–5.3)
SODIUM: 136 meq/L — AB (ref 137–147)
TCO2: 25 mmol/L (ref 0–100)

## 2013-11-28 NOTE — ED Notes (Signed)
Pt given lunch tray.

## 2013-11-28 NOTE — ED Notes (Addendum)
Received pt from home with c/o called her daughter to tell her that her heart was racing and feeling short of breath. Daughter came to pt house told EMS that she tried to palpate her heart rate and that it was "too fast to count." Pt HR remained 80-90 with EMS, pulse ox 95-96% on room air. Pt was recently treated for flu.

## 2013-11-28 NOTE — ED Notes (Signed)
Called service response to check on lunch tray at 1248.

## 2013-11-28 NOTE — ED Provider Notes (Signed)
CSN: [id number]     Arrival date & time 11/28/13  1002 History   First MD Initiated Contact with Patient 11/28/13 1048     Chief Complaint  Patient presents with  . Tachycardia   (Consider location/radiation/quality/duration/timing/severity/associated sxs/prior Treatment) HPI Comments: Hannah Galvan is a 80 y.o. female presents for evaluation of an episode of tachycardia that occurred this morning. Symptoms started shortly after awakening, and before eating. Symptoms improved after sitting. No other problems were noted. She denies dizziness, and weakness, chest pain, shortness of breath, nausea, or vomiting. She drank some coffee after she started feeling better. She's taking her medications, as usual. There are no other known modifying factors.   Past Medical History  Diagnosis Date  . Thyroid disease   . Hypertension   . Hyperlipidemia   . Arthritis   . Cancer     breast, s/p RXT, bilateral  . Hypothyroidism   . Shortness of breath   . Anxiety    Past Surgical History  Procedure Laterality Date  . Abdominal hysterectomy  2008  . Thyroid surgery  2002  . Breast lumpectomy  2000  . Breast lumpectomy  2007  . Cataract extraction  2011  . Rotator cuff repair      right  . Knee surgery      right  . Bladder tact    . Total knee arthroplasty Left 06/02/2013    Dr Jerl Santos  . Total knee arthroplasty Right 06/02/2013    Procedure: RIGHT TOTAL KNEE ARTHROPLASTY;  Surgeon: Velna Ochs, MD;  Location: [facility] OR;  Service: Orthopedics;  Laterality: Right;   Family History  Problem Relation Age of Onset  . Hypertension Mother   . Stroke Mother   . Cancer Father   . Cancer Sister     leukemia  . Cancer Brother     leukemia  . Cancer Sister     vaginal  . Cancer Sister     bladder  . Cancer Brother     prostate, skin  . Cancer Brother     colon  . Cancer Brother     prostate, bone mets  . Cancer Brother     skin  . Heart disease Sister    History  Substance Use  Topics  . Smoking status: Never Smoker   . Smokeless tobacco: Never Used  . Alcohol Use: No   OB History   Grav Para Term Preterm Abortions TAB SAB Ect Mult Living   2 2             Review of Systems  All other systems reviewed and are negative.    Allergies  Ambien; Pyridium; and Sulfa antibiotics  Home Medications   Current Outpatient Rx  Name  Route  Sig  Dispense  Refill  . aspirin EC 81 MG tablet   Oral   Take 81 mg by mouth daily.         . Calcium Carbonate-Vitamin D (CALCIUM + D) 600-200 MG-UNIT TABS   Oral   Take 1 tablet by mouth 2 (two) times daily.           . Cyanocobalamin (VITAMIN B-12) 5000 MCG SUBL   Oral   Take 5,000 mcg by mouth daily.          Marland Kitchen escitalopram (LEXAPRO) 10 MG tablet   Oral   Take 10 mg by mouth daily.          Marland Kitchen exemestane (AROMASIN) 25 MG tablet  Oral   Take 25 mg by mouth daily after breakfast.         . folic acid (FOLVITE) 1 MG tablet   Oral   Take 1 tablet (1 mg total) by mouth daily.   90 tablet   1   . HYDROcodone-acetaminophen (NORCO/VICODIN) 5-325 MG per tablet   Oral   Take 1 tablet by mouth 2 (two) times daily as needed for moderate pain.         Marland Kitchen levothyroxine (SYNTHROID) 125 MCG tablet   Oral   Take 1 tablet (125 mcg total) by mouth daily.   90 tablet   1   . LORazepam (ATIVAN) 1 MG tablet   Oral   Take 1 mg by mouth at bedtime.         Marland Kitchen losartan-hydrochlorothiazide (HYZAAR) 100-25 MG per tablet   Oral   Take 0.5 tablets by mouth daily.          . ondansetron (ZOFRAN-ODT) 8 MG disintegrating tablet   Oral   Take 8 mg by mouth as needed for nausea.          . potassium chloride SA (K-DUR,KLOR-CON) 20 MEQ tablet   Oral   Take 1 tablet (20 mEq total) by mouth daily.   90 tablet   1   . rosuvastatin (CRESTOR) 10 MG tablet   Oral   Take 5 mg by mouth daily.           BP 140/76  Pulse 82  Temp(Src) 98.7 F (37.1 C)  Resp 20  Ht 5\' 3"  (1.6 m)  Wt 135 lb (61.236 kg)   BMI 23.92 kg/m2  SpO2 100% Physical Exam  Nursing note and vitals reviewed. Constitutional: She is oriented to person, place, and time. She appears well-developed.  Elderly, frail  HENT:  Head: Normocephalic and atraumatic.  Eyes: Conjunctivae and EOM are normal. Pupils are equal, round, and reactive to light.  Neck: Normal range of motion and phonation normal. Neck supple.  Cardiovascular: Normal rate, regular rhythm and intact distal pulses.   Pulmonary/Chest: Effort normal and breath sounds normal. She exhibits no tenderness.  Abdominal: Soft. She exhibits no distension. There is no tenderness. There is no guarding.  Musculoskeletal: Normal range of motion.  Neurological: She is alert and oriented to person, place, and time. She exhibits normal muscle tone.  Skin: Skin is warm and dry.  Psychiatric: She has a normal mood and affect. Her behavior is normal. Judgment and thought content normal.    ED Course  Procedures (including critical care time)     1:23 PM Reevaluation with update and discussion. After initial assessment and treatment, an updated evaluation reveals she is comfortable, in no recurrence of tachycardia, she is eating and drinking. All questions are answered. Cindy Brindisi L     Date: 11/28/12  Rate: 82  Rhythm: normal sinus rhythm  QRS Axis: left  PR and QT Intervals: normal  ST/T Wave abnormalities: normal  PR and QRS Conduction Disutrbances:none  Narrative Interpretation:   Old EKG Reviewed: changes noted- minor R axis shift   Labs Review Labs Reviewed  URINALYSIS, ROUTINE W REFLEX MICROSCOPIC - Abnormal; Notable for the following:    pH 8.5 (*)    All other components within normal limits  POCT I-STAT, CHEM 8 - Abnormal; Notable for the following:    Sodium 136 (*)    Potassium 3.5 (*)    Hemoglobin 16.3 (*)    HCT 48.0 (*)    All  other components within normal limits  URINE CULTURE   Imaging Review No results found.  EKG Interpretation    None       MDM   1. Palpitations      Transient tachycardia, without recurrence and with normal emergency department evaluation. There was no associated chest pain, syncope, or neurologic symptoms. There is no indication for further testing and treatment or observation in the emergency department.  The patient appears reasonably screened and/or stabilized for discharge and I doubt any other medical condition or other [facility] requiring further screening, evaluation, or treatment in the ED at this time prior to discharge.  Nursing Notes Reviewed/ Care Coordinated, and agree without changes. Applicable Imaging Reviewed.  Interpretation of Laboratory Data incorporated into ED treatment    Plan: Home Medications- usual; Home Treatments and Observation- rest, fluids, eat regularly; return here if the recommended treatment, does not improve the symptoms; Recommended follow up- PCP for check up in one week     Richarda Blade, MD 11/28/13 1801

## 2013-11-28 NOTE — Discharge Instructions (Signed)
Palpitations   A palpitation is the feeling that your heartbeat is irregular or is faster than normal. It may feel like your heart is fluttering or skipping a beat. Palpitations are usually not a serious problem. However, in some cases, you may need further medical evaluation.  CAUSES   Palpitations can be caused by:   Smoking.   Caffeine or other stimulants, such as diet pills or energy drinks.   Alcohol.   Stress and anxiety.   Strenuous physical activity.   Fatigue.   Certain medicines.   Heart disease, especially if you have a history of arrhythmias. This includes atrial fibrillation, atrial flutter, or supraventricular tachycardia.   An improperly working pacemaker or defibrillator.  DIAGNOSIS   To find the cause of your palpitations, your caregiver will take your history and perform a physical exam. Tests may also be done, including:   Electrocardiography (ECG). This test records the heart's electrical activity.   Cardiac monitoring. This allows your caregiver to monitor your heart rate and rhythm in real time.   Holter monitor. This is a portable device that records your heartbeat and can help diagnose heart arrhythmias. It allows your caregiver to track your heart activity for several days, if needed.   Stress tests by exercise or by giving medicine that makes the heart beat faster.  TREATMENT   Treatment of palpitations depends on the cause of your symptoms and can vary greatly. Most cases of palpitations do not require any treatment other than time, relaxation, and monitoring your symptoms. Other causes, such as atrial fibrillation, atrial flutter, or supraventricular tachycardia, usually require further treatment.  HOME CARE INSTRUCTIONS    Avoid:   Caffeinated coffee, tea, soft drinks, diet pills, and energy drinks.   Chocolate.   Alcohol.   Stop smoking if you smoke.   Reduce your stress and anxiety. Things that can help you relax include:   A method that measures bodily functions so  you can learn to control them (biofeedback).   Yoga.   Meditation.   Physical activity such as swimming, jogging, or walking.   Get plenty of rest and sleep.  SEEK MEDICAL CARE IF:    You continue to have a fast or irregular heartbeat beyond 24 hours.   Your palpitations occur more often.  SEEK IMMEDIATE MEDICAL CARE IF:   You develop chest pain or shortness of breath.   You have a severe headache.   You feel dizzy, or you faint.  MAKE SURE YOU:   Understand these instructions.   Will watch your condition.   Will get help right away if you are not doing well or get worse.  Document Released: 11/07/2000 Document Revised: 03/07/2013 Document Reviewed: 01/09/2012  [employer] Patient Information 2014 [employer].

## 2013-11-28 NOTE — ED Notes (Signed)
Ordered Reg diet 

## 2013-11-28 NOTE — ED Notes (Signed)
Pt given ginger ale to drink. Lunch tray ordered for pt. No further needs. Family at bedside.

## 2013-11-29 LAB — URINE CULTURE: Colony Count: 50000

## 2014-01-02 ENCOUNTER — Ambulatory Visit: Payer: Self-pay | Admitting: Podiatry

## 2014-01-05 ENCOUNTER — Encounter (INDEPENDENT_AMBULATORY_CARE_PROVIDER_SITE_OTHER): Payer: Self-pay | Admitting: General Surgery

## 2014-01-05 ENCOUNTER — Encounter (INDEPENDENT_AMBULATORY_CARE_PROVIDER_SITE_OTHER): Payer: Self-pay

## 2014-01-05 ENCOUNTER — Ambulatory Visit (INDEPENDENT_AMBULATORY_CARE_PROVIDER_SITE_OTHER): Payer: Medicare Other | Admitting: General Surgery

## 2014-01-05 VITALS — BP 130/80 | HR 96 | Temp 97.0°F | Resp 14 | Ht 62.0 in | Wt 142.8 lb

## 2014-01-05 DIAGNOSIS — C50919 Malignant neoplasm of unspecified site of unspecified female breast: Secondary | ICD-10-CM

## 2014-01-05 NOTE — Progress Notes (Signed)
Subjective:     Patient ID: Hannah Galvan, female   DOB: Apr 01, 1931, 80 y.o.   MRN: [id number]  HPI The patient is an 80 year old white female who is 7 years status post left breast lumpectomy for DCIS and 14 years status post right lumpectomy and axillary lymph node dissection for a T1 N1 right breast cancer. Since her last visit she lost her husband max. She has also had a knee replacement and is still doing therapy. Her main complaint is of feeling very nervous and anxious especially in the morning. She is felt like her heart has been racing. She denies any chest pain.  Review of Systems  Constitutional: Positive for fatigue.  HENT: Negative.   Eyes: Negative.   Respiratory: Negative.   Cardiovascular: Negative.   Gastrointestinal: Negative.   Endocrine: Negative.   Genitourinary: Negative.   Musculoskeletal: Negative.   Skin: Negative.   Allergic/Immunologic: Negative.   Neurological: Negative.   Hematological: Negative.   Psychiatric/Behavioral: The patient is nervous/anxious.        Objective:   Physical Exam  Constitutional: She is oriented to person, place, and time. She appears well-developed and well-nourished.  HENT:  Head: Normocephalic and atraumatic.  Eyes: Conjunctivae and EOM are normal. Pupils are equal, round, and reactive to light.  Neck: Normal range of motion. Neck supple.  Cardiovascular: Normal rate, regular rhythm and normal heart sounds.   Pulmonary/Chest: Effort normal and breath sounds normal.  She has some palpable contraction of her scar laterally on the left breast. Otherwise there is no palpable mass in either breast. There is no palpable axillary, supraclavicular, or cervical lymphadenopathy  Abdominal: Soft. Bowel sounds are normal.  Musculoskeletal: Normal range of motion.  Lymphadenopathy:    She has no cervical adenopathy.  Neurological: She is alert and oriented to person, place, and time.  Skin: Skin is warm and dry.  Psychiatric: She has a  normal mood and affect. Her behavior is normal.       Assessment:     The patient is 7 years status post left breast lumpectomy and 14 years status post right lumpectomy with node dissection for breast cancer     Plan:     At this point she will continue to do regular self exams. I think she needs to contact [facility] cardiology for evaluation. I will plan to see her back in one year.

## 2014-01-05 NOTE — Patient Instructions (Addendum)
Call  to see Dr. Burt Knack or Truitt Merle Try to drink more fluids

## 2014-01-18 ENCOUNTER — Telehealth: Payer: Self-pay | Admitting: Hematology & Oncology

## 2014-01-18 NOTE — Telephone Encounter (Signed)
Pt called scheduled 3-30 appointment. I transferred her to RN voice mail she may try to come in sooner

## 2014-01-23 ENCOUNTER — Encounter: Payer: Self-pay | Admitting: Podiatry

## 2014-01-23 ENCOUNTER — Ambulatory Visit (INDEPENDENT_AMBULATORY_CARE_PROVIDER_SITE_OTHER): Payer: Medicare Other | Admitting: Podiatry

## 2014-01-23 VITALS — BP 125/64 | HR 86 | Resp 12

## 2014-01-23 DIAGNOSIS — M79609 Pain in unspecified limb: Secondary | ICD-10-CM

## 2014-01-23 DIAGNOSIS — B351 Tinea unguium: Secondary | ICD-10-CM

## 2014-01-23 DIAGNOSIS — G589 Mononeuropathy, unspecified: Secondary | ICD-10-CM

## 2014-01-23 NOTE — Progress Notes (Signed)
   Subjective:    Patient ID: Hannah Galvan, female    DOB: 1931-03-31, 80 y.o.   MRN: [id number]  HPI '' BOTH 1ST AND 2ND TOENAILS ARE SORE. THE TOES GET AGGRAVATED BY PRESSURE AND WEARING SHOES. THE TOES ARE GETTING WORSE. TRIED TO SOAK WITH EPSON SALT AND WRAP WITH THE BANDAGE AND IT HELPS BUT NOT MUCH.'' ALSO, BOTH OF THE BOTTOM OF THE FEET HAVE NUMBNESS AND TIGHTNESS FEELING FOR 6 MONTHS AND IS ABOUT THE SAME. TRIED TO SOAK THEM WITH EPSON SALT AND IT HELPS SOME.''    Review of Systems  Musculoskeletal: Positive for joint swelling.  All other systems reviewed and are negative.       Objective:   Physical Exam Orientated x76 80 year old female presents with her daughter  Vascular: The DP is are 4/4 bilaterally. PTs are 2/4 bilaterally  Neurological: Sensation to 10 g monofilament wire intact 4/5 right and 34/5 left. Vibratory sensation intact bilaterally. Ankle foot reflexes are reactive bilaterally.  Dermatological: Atrophic skin noted without any hair growth bilaterally. The hallux and second toenails are discolored somewhat incurvated and mildly tender to palpation.  Musculoskeletal: Medium arch contour noted with a unsteady gait pattern.       Assessment & Plan:   Assessment: Low-grade sensory neuropathy bilaterally Gait disturbance bilaterally Symptomatic mycotic toenails 1 and 2 bilaterally.   Plan: Advised patient that she had some evidence of mild sensory neuropathy which may attribute to some of her perceived numbness in her feet. Advised patient to consult with her primary doctor confirm her blood glucose levels are within normal range.  The hallux and second toenails were debrided bilaterally without any bleeding.  Reappoint at patient's request.

## 2014-02-20 ENCOUNTER — Other Ambulatory Visit (HOSPITAL_BASED_OUTPATIENT_CLINIC_OR_DEPARTMENT_OTHER): Payer: Medicare Other | Admitting: Lab

## 2014-02-20 ENCOUNTER — Encounter: Payer: Self-pay | Admitting: Hematology & Oncology

## 2014-02-20 ENCOUNTER — Ambulatory Visit (HOSPITAL_BASED_OUTPATIENT_CLINIC_OR_DEPARTMENT_OTHER): Payer: Medicare Other | Admitting: Hematology & Oncology

## 2014-02-20 VITALS — BP 126/53 | HR 75 | Temp 97.3°F | Resp 14 | Ht 62.0 in | Wt 147.0 lb

## 2014-02-20 DIAGNOSIS — C50919 Malignant neoplasm of unspecified site of unspecified female breast: Secondary | ICD-10-CM

## 2014-02-20 DIAGNOSIS — M818 Other osteoporosis without current pathological fracture: Secondary | ICD-10-CM

## 2014-02-20 DIAGNOSIS — D0590 Unspecified type of carcinoma in situ of unspecified breast: Secondary | ICD-10-CM

## 2014-02-20 DIAGNOSIS — C50911 Malignant neoplasm of unspecified site of right female breast: Secondary | ICD-10-CM

## 2014-02-20 DIAGNOSIS — Z853 Personal history of malignant neoplasm of breast: Secondary | ICD-10-CM

## 2014-02-20 LAB — CBC WITH DIFFERENTIAL (CANCER CENTER ONLY)
BASO#: 0 10*3/uL (ref 0.0–0.2)
BASO%: 0.4 % (ref 0.0–2.0)
EOS ABS: 0.2 10*3/uL (ref 0.0–0.5)
EOS%: 2.4 % (ref 0.0–7.0)
HEMATOCRIT: 41.2 % (ref 34.8–46.6)
HEMOGLOBIN: 14.2 g/dL (ref 11.6–15.9)
LYMPH#: 2.5 10*3/uL (ref 0.9–3.3)
LYMPH%: 32.7 % (ref 14.0–48.0)
MCH: 32.4 pg (ref 26.0–34.0)
MCHC: 34.5 g/dL (ref 32.0–36.0)
MCV: 94 fL (ref 81–101)
MONO#: 0.8 10*3/uL (ref 0.1–0.9)
MONO%: 10.4 % (ref 0.0–13.0)
NEUT#: 4.1 10*3/uL (ref 1.5–6.5)
NEUT%: 54.1 % (ref 39.6–80.0)
Platelets: 238 10*3/uL (ref 145–400)
RBC: 4.38 10*6/uL (ref 3.70–5.32)
RDW: 13 % (ref 11.1–15.7)
WBC: 7.5 10*3/uL (ref 3.9–10.0)

## 2014-02-20 NOTE — Progress Notes (Signed)
Hematology and Oncology Follow Up Visit  Hannah Galvan [id number] [birth year] 80 y.o. 02/20/2014   Principle Diagnosis:  1. Ductal carcinoma in situ of the left breast. 2. History of stage IIA (T1 N1 M0) ductal carcinoma of the right     breast. 3. History of drug-induced hemolytic anemia  Current Therapy:   Aromasin 25 mg p.o. daily for prophylaxis.     Interim History:  Ms.  Galvan is back for followup. His bili urinalysis her husband passed away. She's doing okay. She does have some difficult days.  Lucite in her knees are giving her problem again. She had right knee surgery last year. Is that she may need to have left knee surgery. She does get knee injections.  She's had no problems with cough or shortness of breath. There's been no change in bowel or bladder habits. She's had no nausea.  Her last mammogram was done in December of 2014. There is a light okay. As above  Medications: Current outpatient prescriptions:aspirin EC 81 MG tablet, Take 81 mg by mouth daily., Disp: , Rfl: ;  Calcium Carbonate-Vitamin D (CALCIUM + D) 600-200 MG-UNIT TABS, Take 1 tablet by mouth 2 (two) times daily.  , Disp: , Rfl: ;  Cyanocobalamin (VITAMIN B-12) 5000 MCG SUBL, Take 5,000 mcg by mouth daily. , Disp: , Rfl: ;  escitalopram (LEXAPRO) 10 MG tablet, Take 10 mg by mouth daily. , Disp: , Rfl:  exemestane (AROMASIN) 25 MG tablet, Take 25 mg by mouth daily after breakfast., Disp: , Rfl: ;  folic acid (FOLVITE) 1 MG tablet, Take 1 tablet (1 mg total) by mouth daily., Disp: 90 tablet, Rfl: 1;  HYDROcodone-acetaminophen (NORCO/VICODIN) 5-325 MG per tablet, Take 1 tablet by mouth 2 (two) times daily as needed for moderate pain., Disp: , Rfl:  levothyroxine (SYNTHROID) 125 MCG tablet, Take 1 tablet (125 mcg total) by mouth daily., Disp: 90 tablet, Rfl: 1;  LORazepam (ATIVAN) 1 MG tablet, Take 1 mg by mouth at bedtime., Disp: , Rfl: ;  losartan-hydrochlorothiazide (HYZAAR) 100-25 MG per tablet, Take 0.5 tablets by  mouth daily. , Disp: , Rfl: ;  potassium chloride SA (K-DUR,KLOR-CON) 20 MEQ tablet, Take 1 tablet (20 mEq total) by mouth daily., Disp: 90 tablet, Rfl: 1 traMADol (ULTRAM) 50 MG tablet, Take 50 mg by mouth 2 (two) times daily. , Disp: , Rfl: ;  ondansetron (ZOFRAN-ODT) 8 MG disintegrating tablet, Take 8 mg by mouth as needed for nausea. , Disp: , Rfl: ;  rosuvastatin (CRESTOR) 10 MG tablet, Take 5 mg by mouth daily. , Disp: , Rfl:   Allergies:  Allergies  Allergen Reactions  . Ambien [Zolpidem]     confusion  . Pyridium [Phenazopyridine Hcl] Other (See Comments)    hemolysis  . Sulfa Antibiotics Rash    Past Medical History, Surgical history, Social history, and Family History were reviewed and updated.  Review of Systems: As above  Physical Exam:  height is 5\' 2"  (1.575 m) and weight is 147 lb (66.679 kg). Her oral temperature is 97.3 F (36.3 C). Her blood pressure is 126/53 and her pulse is 75. Her respiration is 14.   [name]. Her lungs are clear. Cardiac exam regular rate and rhythm. Skin exam shows seborrheic keratoses. Back exam shows kyphosis. Breast exam shows a left breast with a well-healed lumpectomy. This is at the 4:00 position. She has no left breast mass but there is no left axillary adenopathy. Right breast shows a lumpectomy of the 9:00  position. The lipid resize well-healed. There is no right breast masses the right axillary adenopathy. Extremities do show some chronic edema in the legs. She has swelling in her knees. Neurological exam nonfocal.  Lab Results  Component Value Date   WBC 7.5 02/20/2014   HGB 14.2 02/20/2014   HCT 41.2 02/20/2014   MCV 94 02/20/2014   PLT 238 02/20/2014     Chemistry      Component Value Date/Time   NA 136* 11/28/2013 1159   K 3.5* 11/28/2013 1159   CL 97 11/28/2013 1159   CO2 25 06/07/2013 0520   BUN 9 11/28/2013 1159   CREATININE 0.90 11/28/2013 1159   CREATININE 0.93 06/02/2012 1511      Component Value Date/Time   CALCIUM  8.8 06/07/2013 0520   ALKPHOS 51 05/25/2013 1347   AST 30 05/25/2013 1347   ALT 21 05/25/2013 1347   BILITOT 0.4 05/25/2013 1347         Impression and Plan: Hannah Galvan is a-year-old white female. She is history of 2 separate breast primaries. She had right breast cancer that states to wave. This was diagnosed back in 2000. She had ductal carcinoma in situ of the left breast. Is a diagnosed in 2007. Her vital see any issues with either of these.  We will plan to get her back in another 6 months.  I don't see any problems with her if she needs to have the surgery.   Volanda Napoleon, MD 3/30/20155:20 PM

## 2014-02-21 LAB — COMPREHENSIVE METABOLIC PANEL
ALK PHOS: 54 U/L (ref 39–117)
ALT: 20 U/L (ref 0–35)
AST: 25 U/L (ref 0–37)
Albumin: 4.1 g/dL (ref 3.5–5.2)
BILIRUBIN TOTAL: 0.5 mg/dL (ref 0.2–1.2)
BUN: 10 mg/dL (ref 6–23)
CO2: 29 meq/L (ref 19–32)
Calcium: 9.2 mg/dL (ref 8.4–10.5)
Chloride: 97 mEq/L (ref 96–112)
Creatinine, Ser: 0.82 mg/dL (ref 0.50–1.10)
GLUCOSE: 115 mg/dL — AB (ref 70–99)
Potassium: 3.6 mEq/L (ref 3.5–5.3)
SODIUM: 133 meq/L — AB (ref 135–145)
TOTAL PROTEIN: 6.1 g/dL (ref 6.0–8.3)

## 2014-02-21 LAB — VITAMIN D 25 HYDROXY (VIT D DEFICIENCY, FRACTURES): VIT D 25 HYDROXY: 66 ng/mL (ref 30–89)

## 2014-02-22 ENCOUNTER — Telehealth: Payer: Self-pay

## 2014-02-22 NOTE — Telephone Encounter (Signed)
Spoke with pt via phone re: Dr Ginette Pitman - labs normal. Verbalizes understanding. ph

## 2014-04-14 ENCOUNTER — Other Ambulatory Visit: Payer: Self-pay | Admitting: Endocrinology

## 2014-04-14 ENCOUNTER — Ambulatory Visit
Admission: RE | Admit: 2014-04-14 | Discharge: 2014-04-14 | Disposition: A | Discharge status: Home / Self Care | Payer: Medicare Other | Source: Ambulatory Visit | Attending: Endocrinology | Admitting: Endocrinology

## 2014-04-14 DIAGNOSIS — S0990XA Unspecified injury of head, initial encounter: Secondary | ICD-10-CM

## 2014-07-14 ENCOUNTER — Other Ambulatory Visit: Payer: Self-pay | Admitting: Orthopaedic Surgery

## 2014-08-08 ENCOUNTER — Encounter (HOSPITAL_COMMUNITY): Payer: Self-pay | Admitting: Pharmacy Technician

## 2014-08-10 ENCOUNTER — Ambulatory Visit (HOSPITAL_COMMUNITY)
Admission: RE | Admit: 2014-08-10 | Discharge: 2014-08-10 | Disposition: A | Discharge status: Home / Self Care | Payer: Medicare Other | Source: Ambulatory Visit | Attending: Orthopaedic Surgery | Admitting: Orthopaedic Surgery

## 2014-08-10 ENCOUNTER — Encounter (HOSPITAL_COMMUNITY)
Admission: RE | Admit: 2014-08-10 | Discharge: 2014-08-10 | Disposition: A | Discharge status: Home / Self Care | Payer: Medicare Other | Source: Ambulatory Visit | Attending: Orthopaedic Surgery | Admitting: Orthopaedic Surgery

## 2014-08-10 ENCOUNTER — Encounter (HOSPITAL_COMMUNITY): Payer: Self-pay

## 2014-08-10 DIAGNOSIS — Z01818 Encounter for other preprocedural examination: Secondary | ICD-10-CM | POA: Insufficient documentation

## 2014-08-10 LAB — CBC WITH DIFFERENTIAL/PLATELET
Basophils Absolute: 0 10*3/uL (ref 0.0–0.1)
Basophils Relative: 0 % (ref 0–1)
EOS PCT: 0 % (ref 0–5)
Eosinophils Absolute: 0 10*3/uL (ref 0.0–0.7)
HCT: 44.2 % (ref 36.0–46.0)
Hemoglobin: 15.4 g/dL — ABNORMAL HIGH (ref 12.0–15.0)
LYMPHS ABS: 1.3 10*3/uL (ref 0.7–4.0)
LYMPHS PCT: 10 % — AB (ref 12–46)
MCH: 32.4 pg (ref 26.0–34.0)
MCHC: 34.8 g/dL (ref 30.0–36.0)
MCV: 93.1 fL (ref 78.0–100.0)
MONOS PCT: 6 % (ref 3–12)
Monocytes Absolute: 0.7 10*3/uL (ref 0.1–1.0)
Neutro Abs: 10.6 10*3/uL — ABNORMAL HIGH (ref 1.7–7.7)
Neutrophils Relative %: 84 % — ABNORMAL HIGH (ref 43–77)
Platelets: 254 10*3/uL (ref 150–400)
RBC: 4.75 MIL/uL (ref 3.87–5.11)
RDW: 12.9 % (ref 11.5–15.5)
WBC: 12.6 10*3/uL — AB (ref 4.0–10.5)

## 2014-08-10 LAB — PROTIME-INR
INR: 1.13 (ref 0.00–1.49)
Prothrombin Time: 14.5 seconds (ref 11.6–15.2)

## 2014-08-10 LAB — URINALYSIS, ROUTINE W REFLEX MICROSCOPIC
Bilirubin Urine: NEGATIVE
Glucose, UA: NEGATIVE mg/dL
Hgb urine dipstick: NEGATIVE
Ketones, ur: NEGATIVE mg/dL
Leukocytes, UA: NEGATIVE
NITRITE: NEGATIVE
Protein, ur: NEGATIVE mg/dL
SPECIFIC GRAVITY, URINE: 1.017 (ref 1.005–1.030)
UROBILINOGEN UA: 0.2 mg/dL (ref 0.0–1.0)
pH: 7.5 (ref 5.0–8.0)

## 2014-08-10 LAB — TYPE AND SCREEN
ABO/RH(D): A POS
ANTIBODY SCREEN: NEGATIVE

## 2014-08-10 LAB — BASIC METABOLIC PANEL
Anion gap: 17 — ABNORMAL HIGH (ref 5–15)
BUN: 14 mg/dL (ref 6–23)
CO2: 24 meq/L (ref 19–32)
Calcium: 9.8 mg/dL (ref 8.4–10.5)
Chloride: 95 mEq/L — ABNORMAL LOW (ref 96–112)
Creatinine, Ser: 0.63 mg/dL (ref 0.50–1.10)
GFR calc Af Amer: >90 mL/min (ref >90)
GFR calc non Af Amer: 82 mL/min — ABNORMAL LOW (ref >90)
Glucose, Bld: 118 mg/dL — ABNORMAL HIGH (ref 70–99)
POTASSIUM: 4.4 meq/L (ref 3.7–5.3)
SODIUM: 136 meq/L — AB (ref 137–147)

## 2014-08-10 LAB — SURGICAL PCR SCREEN
MRSA, PCR: NEGATIVE
Staphylococcus aureus: NEGATIVE

## 2014-08-10 LAB — APTT: aPTT: 25 seconds (ref 24–37)

## 2014-08-10 NOTE — Pre-Procedure Instructions (Signed)
MALLOREY ODONELL  08/10/2014   Your procedure is scheduled on:  Tuesday, Sept. 29th   Report to [facility] Admitting at  8:30  AM.  Call this number if you have problems the morning of surgery: [phone]   Remember:   Do not eat food or drink liquids after midnight Monday.   Take these medicines the morning of surgery with A SIP OF WATER: Levothyroxine, Hydrocodone, Klonopin.             Stop taking aspirin 4-5 days prior to surgery.   Do not wear jewelry, make-up or nail polish.  Do not wear lotions, powders, or perfumes. You may NOT wear deodorant.  Do not shave 48 hours prior to surgery.   Do not bring valuables to the hospital.  [facility] is not responsible for any belongings or valuables.               Contacts, dentures or bridgework may not be worn into surgery.  Leave suitcase in the car. After surgery it may be brought to your room.  For patients admitted to the hospital, discharge time is determined by your treatment team.    Name and phone number of your driver:    Special Instructions: "Preparing for Surgery" instruction sheet.   Please read over the following fact sheets that you were given: Pain Booklet, Coughing and Deep Breathing, Blood Transfusion Information, MRSA Information and Surgical Site Infection Prevention

## 2014-08-11 ENCOUNTER — Other Ambulatory Visit (HOSPITAL_COMMUNITY): Payer: Medicare Other

## 2014-08-21 ENCOUNTER — Other Ambulatory Visit: Payer: Medicare Other | Admitting: Lab

## 2014-08-21 ENCOUNTER — Telehealth: Payer: Self-pay | Admitting: Hematology & Oncology

## 2014-08-21 ENCOUNTER — Ambulatory Visit: Payer: Medicare Other | Admitting: Hematology & Oncology

## 2014-08-21 MED ORDER — CHLORHEXIDINE GLUCONATE 4 % EX LIQD
60.0000 mL | Freq: Once | CUTANEOUS | Status: DC
  Filled 2014-08-21: qty 60

## 2014-08-21 MED ORDER — CEFAZOLIN SODIUM-DEXTROSE 2-3 GM-% IV SOLR
2.0000 g | INTRAVENOUS | Status: AC
  Administered 2014-08-22: 2 g via INTRAVENOUS
  Filled 2014-08-21: qty 50

## 2014-08-21 NOTE — Telephone Encounter (Signed)
Patient called and cx 08/21/14 apt due to having knee surgery.  She said she would call back to resch.  Rn was notified of cx apt

## 2014-08-21 NOTE — H&P (Signed)
TOTAL KNEE ADMISSION H&P  Patient is being admitted for left total knee arthroplasty.  Subjective:  Chief Complaint:left knee pain.  HPI: Hannah Galvan, 80 y.o. female, has a history of pain and functional disability in the left knee due to arthritis and has failed non-surgical conservative treatments for greater than 12 weeks to includeNSAID's and/or analgesics, corticosteriod injections, viscosupplementation injections, flexibility and strengthening excercises, use of assistive devices, weight reduction as appropriate and activity modification.  Onset of symptoms was gradual, starting 5 years ago with gradually worsening course since that time. The patient noted no past surgery on the left knee(s).  Patient currently rates pain in the left knee(s) at 10 out of 10 with activity. Patient has night pain, worsening of pain with activity and weight bearing, pain that interferes with activities of daily living, crepitus and joint swelling.  Patient has evidence of subchondral cysts, subchondral sclerosis, periarticular osteophytes and joint space narrowing by imaging studies. There is no active infection.  Patient Active Problem List   Diagnosis Date Noted  . Acute delirium 06/05/2013  . Hyponatremia 06/05/2013  . Acute urinary retention 06/05/2013  . Hypokalemia 06/05/2013  . Fever, unspecified 06/05/2013  . hypothyroidism 06/05/2013  . Left knee DJD 06/02/2013    Class: Chronic  . UTI (lower urinary tract infection) 08/31/2012  . Heart murmur, aortic 07/15/2012  . Dizziness - giddy 07/15/2012  . Gait disturbance 12/21/2011  . Osteopenia 12/21/2011  . Cholelithiasis with cholecystitis without obstruction 12/21/2011  . Liver hemangioma 12/21/2011  . Pulmonary nodule, right 12/21/2011  . Breast cancer 12/03/2011  . Edema of both legs 09/15/2011  . Thyroid disease   . Hypertension   . Hyperlipidemia   . Arthritis   . Cancer    Past Medical History  Diagnosis Date  . Thyroid disease    . Hypertension   . Hyperlipidemia   . Arthritis   . Cancer     breast, s/p RXT, bilateral  . Hypothyroidism   . Shortness of breath   . Anxiety     Past Surgical History  Procedure Laterality Date  . Abdominal hysterectomy  2008  . Thyroid surgery  2002  . Breast lumpectomy  2000  . Breast lumpectomy  2007  . Cataract extraction  2011  . Rotator cuff repair      right  . Knee surgery      right  . Bladder tact    . Total knee arthroplasty Left 06/02/2013    Dr Rhona Raider  . Total knee arthroplasty Right 06/02/2013    Procedure: RIGHT TOTAL KNEE ARTHROPLASTY;  Surgeon: Hessie Dibble, MD;  Location: [facility];  Service: Orthopedics;  Laterality: Right;    No prescriptions prior to admission   Allergies  Allergen Reactions  . Ambien [Zolpidem]     confusion  . Pyridium [Phenazopyridine Hcl] Other (See Comments)    hemolysis  . Sulfa Antibiotics Rash    History  Substance Use Topics  . Smoking status: Never Smoker   . Smokeless tobacco: Never Used     Comment: never used tobacco  . Alcohol Use: No    Family History  Problem Relation Age of Onset  . Hypertension Mother   . Stroke Mother   . Cancer Father   . Cancer Sister     leukemia  . Cancer Brother     leukemia  . Cancer Sister     vaginal  . Cancer Sister     bladder  . Cancer Brother  prostate, skin  . Cancer Brother     colon  . Cancer Brother     prostate, bone mets  . Cancer Brother     skin  . Heart disease Sister      Review of Systems  Musculoskeletal: Positive for joint pain.       Left knee  All other systems reviewed and are negative.   Objective:  Physical Exam  Constitutional: She is oriented to person, place, and time. She appears well-developed and well-nourished.  HENT:  Head: Normocephalic and atraumatic.  Eyes: Conjunctivae are normal. Pupils are equal, round, and reactive to light.  Neck: Normal range of motion.  Cardiovascular: Normal rate and regular rhythm.    Respiratory: Effort normal.  GI: Soft.  Musculoskeletal:  Examination of the left knee shows range of motion from 5-120.  No effusion.  1+ crepitation.  She is tender to palpation across her medial joint line.  Her ligaments are stable.  Her calf is soft and nontender.  She is neurovascular intact distally.  Examination of the right knee shows range of motion from 0-120.  No effusion.  Surgical incision is well healed with no signs of infection.  Knee is stable to varus and valgus stressing.  Her calf is soft and nontender.  She is neurovascularly intact distally.    Neurological: She is alert and oriented to person, place, and time.  Skin: Skin is warm and dry.  Psychiatric: She has a normal mood and affect. Her behavior is normal. Judgment and thought content normal.    Vital signs in last 24 hours:    Labs:   Estimated body mass index is 26.88 kg/(m^2) as calculated from the following:   Height as of 02/20/14: 5\' 2"  (1.575 m).   Weight as of 02/20/14: 66.679 kg (147 lb).   Imaging Review Plain radiographs demonstrate severe degenerative joint disease of the left knee(s). The overall alignment isneutral. The bone quality appears to be good for age and reported activity level.  Assessment/Plan:  End stage arthritis, left knee   The patient history, physical examination, clinical judgment of the provider and imaging studies are consistent with end stage degenerative joint disease of the left knee(s) and total knee arthroplasty is deemed medically necessary. The treatment options including medical management, injection therapy arthroscopy and arthroplasty were discussed at length. The risks and benefits of total knee arthroplasty were presented and reviewed. The risks due to aseptic loosening, infection, stiffness, patella tracking problems, thromboembolic complications and other imponderables were discussed. The patient acknowledged the explanation, agreed to proceed with the plan and  consent was signed. Patient is being admitted for inpatient treatment for surgery, pain control, PT, OT, prophylactic antibiotics, VTE prophylaxis, progressive ambulation and ADL's and discharge planning. The patient is planning to be discharged to skilled nursing facility

## 2014-08-22 ENCOUNTER — Encounter (HOSPITAL_COMMUNITY): Payer: Medicare Other | Admitting: Certified Registered Nurse Anesthetist

## 2014-08-22 ENCOUNTER — Inpatient Hospital Stay (HOSPITAL_COMMUNITY)
Admission: RE | Admit: 2014-08-22 | Discharge: 2014-08-25 | DRG: 470 | Disposition: A | Discharge status: Skilled Nursing Facility | Payer: Medicare Other | Source: Ambulatory Visit | Attending: Orthopaedic Surgery | Admitting: Orthopaedic Surgery

## 2014-08-22 ENCOUNTER — Inpatient Hospital Stay (HOSPITAL_COMMUNITY): Payer: Medicare Other | Admitting: Certified Registered Nurse Anesthetist

## 2014-08-22 ENCOUNTER — Encounter (HOSPITAL_COMMUNITY)
Admission: RE | Disposition: A | Discharge status: Skilled Nursing Facility | Payer: Self-pay | Source: Ambulatory Visit | Attending: Orthopaedic Surgery

## 2014-08-22 ENCOUNTER — Encounter (HOSPITAL_COMMUNITY): Payer: Self-pay | Admitting: Certified Registered Nurse Anesthetist

## 2014-08-22 DIAGNOSIS — Z889 Allergy status to unspecified drugs, medicaments and biological substances status: Secondary | ICD-10-CM | POA: Diagnosis not present

## 2014-08-22 DIAGNOSIS — Z23 Encounter for immunization: Secondary | ICD-10-CM

## 2014-08-22 DIAGNOSIS — Z853 Personal history of malignant neoplasm of breast: Secondary | ICD-10-CM

## 2014-08-22 DIAGNOSIS — M25562 Pain in left knee: Secondary | ICD-10-CM | POA: Diagnosis present

## 2014-08-22 DIAGNOSIS — Z882 Allergy status to sulfonamides status: Secondary | ICD-10-CM

## 2014-08-22 DIAGNOSIS — M8580 Other specified disorders of bone density and structure, unspecified site: Secondary | ICD-10-CM | POA: Diagnosis present

## 2014-08-22 DIAGNOSIS — F419 Anxiety disorder, unspecified: Secondary | ICD-10-CM | POA: Diagnosis not present

## 2014-08-22 DIAGNOSIS — Z806 Family history of leukemia: Secondary | ICD-10-CM | POA: Diagnosis not present

## 2014-08-22 DIAGNOSIS — M1710 Unilateral primary osteoarthritis, unspecified knee: Secondary | ICD-10-CM | POA: Diagnosis not present

## 2014-08-22 DIAGNOSIS — Z8249 Family history of ischemic heart disease and other diseases of the circulatory system: Secondary | ICD-10-CM | POA: Diagnosis not present

## 2014-08-22 DIAGNOSIS — M1712 Unilateral primary osteoarthritis, left knee: Secondary | ICD-10-CM | POA: Diagnosis present

## 2014-08-22 DIAGNOSIS — E785 Hyperlipidemia, unspecified: Secondary | ICD-10-CM | POA: Diagnosis not present

## 2014-08-22 DIAGNOSIS — E039 Hypothyroidism, unspecified: Secondary | ICD-10-CM | POA: Diagnosis present

## 2014-08-22 DIAGNOSIS — Z823 Family history of stroke: Secondary | ICD-10-CM | POA: Diagnosis not present

## 2014-08-22 DIAGNOSIS — I10 Essential (primary) hypertension: Secondary | ICD-10-CM | POA: Diagnosis present

## 2014-08-22 HISTORY — PX: TOTAL KNEE ARTHROPLASTY: SHX125

## 2014-08-22 SURGERY — ARTHROPLASTY, KNEE, TOTAL
Anesthesia: Monitor Anesthesia Care | Site: Knee | Laterality: Left

## 2014-08-22 MED ORDER — PHENYLEPHRINE HCL 10 MG/ML IJ SOLN
INTRAMUSCULAR | Status: AC
  Filled 2014-08-22: qty 1

## 2014-08-22 MED ORDER — METOCLOPRAMIDE HCL 5 MG/ML IJ SOLN: 5.0000 mg | Freq: Three times a day (TID) | INTRAMUSCULAR | Status: DC | PRN

## 2014-08-22 MED ORDER — DIPHENHYDRAMINE HCL 12.5 MG/5ML PO ELIX: 12.5000 mg | ORAL_SOLUTION | ORAL | Status: DC | PRN

## 2014-08-22 MED ORDER — VITAMIN B-12 1000 MCG PO TABS
5000.0000 ug | ORAL_TABLET | Freq: Every day | ORAL | Status: DC
  Administered 2014-08-23 – 2014-08-24 (×2): 5000 ug via ORAL
  Filled 2014-08-22 (×3): qty 5

## 2014-08-22 MED ORDER — ESCITALOPRAM OXALATE 10 MG PO TABS
10.0000 mg | ORAL_TABLET | Freq: Every day | ORAL | Status: DC
  Administered 2014-08-22 – 2014-08-25 (×4): 10 mg via ORAL
  Filled 2014-08-22 (×4): qty 1

## 2014-08-22 MED ORDER — PROPOFOL 10 MG/ML IV BOLUS
INTRAVENOUS | Status: AC
  Filled 2014-08-22: qty 20

## 2014-08-22 MED ORDER — CEFAZOLIN SODIUM-DEXTROSE 2-3 GM-% IV SOLR
2.0000 g | Freq: Four times a day (QID) | INTRAVENOUS | Status: AC
  Administered 2014-08-22 – 2014-08-23 (×2): 2 g via INTRAVENOUS
  Filled 2014-08-22 (×2): qty 50

## 2014-08-22 MED ORDER — METHOCARBAMOL 1000 MG/10ML IJ SOLN
500.0000 mg | Freq: Four times a day (QID) | INTRAVENOUS | Status: DC | PRN
  Filled 2014-08-22: qty 5

## 2014-08-22 MED ORDER — ONDANSETRON HCL 4 MG/2ML IJ SOLN: 4.0000 mg | Freq: Four times a day (QID) | INTRAMUSCULAR | Status: DC | PRN

## 2014-08-22 MED ORDER — PROMETHAZINE HCL 25 MG/ML IJ SOLN: 6.2500 mg | INTRAMUSCULAR | Status: DC | PRN

## 2014-08-22 MED ORDER — OXYCODONE HCL 5 MG/5ML PO SOLN: 5.0000 mg | Freq: Once | ORAL | Status: DC | PRN

## 2014-08-22 MED ORDER — HYDROMORPHONE HCL 1 MG/ML IJ SOLN
0.2500 mg | INTRAMUSCULAR | Status: DC | PRN
Start: 2014-08-22 — End: 2014-08-22

## 2014-08-22 MED ORDER — SODIUM CHLORIDE 0.9 % IR SOLN
Status: DC | PRN
  Administered 2014-08-22 (×2): 1000 mL

## 2014-08-22 MED ORDER — 0.9 % SODIUM CHLORIDE (POUR BTL) OPTIME
TOPICAL | Status: DC | PRN
  Administered 2014-08-22: 1000 mL

## 2014-08-22 MED ORDER — MENTHOL 3 MG MT LOZG
1.0000 | LOZENGE | OROMUCOSAL | Status: DC | PRN
Start: 2014-08-22 — End: 2014-08-25

## 2014-08-22 MED ORDER — CALCIUM CARBONATE-VITAMIN D 500-200 MG-UNIT PO TABS
1.0000 | ORAL_TABLET | Freq: Two times a day (BID) | ORAL | Status: DC
  Administered 2014-08-22 – 2014-08-25 (×6): 1 via ORAL
  Filled 2014-08-22 (×8): qty 1

## 2014-08-22 MED ORDER — ONDANSETRON HCL 4 MG/2ML IJ SOLN
INTRAMUSCULAR | Status: AC
  Filled 2014-08-22: qty 2

## 2014-08-22 MED ORDER — LACTATED RINGERS IV SOLN
INTRAVENOUS | Status: DC | PRN
  Administered 2014-08-22 (×2): via INTRAVENOUS

## 2014-08-22 MED ORDER — LACTATED RINGERS IV SOLN
INTRAVENOUS | Status: DC
  Administered 2014-08-22: 50 mL/h via INTRAVENOUS

## 2014-08-22 MED ORDER — FENTANYL CITRATE 0.05 MG/ML IJ SOLN
INTRAMUSCULAR | Status: AC
Start: 2014-08-22 — End: 2014-08-22
  Filled 2014-08-22: qty 5

## 2014-08-22 MED ORDER — FOLIC ACID 1 MG PO TABS
1.0000 mg | ORAL_TABLET | Freq: Every day | ORAL | Status: DC
  Administered 2014-08-23 – 2014-08-25 (×3): 1 mg via ORAL
  Filled 2014-08-22 (×3): qty 1

## 2014-08-22 MED ORDER — PROPOFOL INFUSION 10 MG/ML OPTIME
INTRAVENOUS | Status: DC | PRN
  Administered 2014-08-22: 25 ug/kg/min via INTRAVENOUS

## 2014-08-22 MED ORDER — HYDROMORPHONE HCL 1 MG/ML IJ SOLN
0.5000 mg | INTRAMUSCULAR | Status: DC | PRN
  Administered 2014-08-22: 1 mg via INTRAVENOUS
  Filled 2014-08-22: qty 1

## 2014-08-22 MED ORDER — LACTATED RINGERS IV SOLN
INTRAVENOUS | Status: DC
  Administered 2014-08-22 – 2014-08-23 (×2): via INTRAVENOUS

## 2014-08-22 MED ORDER — ONDANSETRON 8 MG PO TBDP: 8.0000 mg | ORAL_TABLET | Freq: Three times a day (TID) | ORAL | Status: DC | PRN

## 2014-08-22 MED ORDER — LOSARTAN POTASSIUM 50 MG PO TABS
100.0000 mg | ORAL_TABLET | Freq: Every day | ORAL | Status: DC
  Administered 2014-08-23 – 2014-08-25 (×3): 100 mg via ORAL
  Filled 2014-08-22 (×4): qty 2

## 2014-08-22 MED ORDER — PHENOL 1.4 % MT LIQD: 1.0000 | OROMUCOSAL | Status: DC | PRN

## 2014-08-22 MED ORDER — ONDANSETRON HCL 4 MG PO TABS: 4.0000 mg | ORAL_TABLET | Freq: Four times a day (QID) | ORAL | Status: DC | PRN

## 2014-08-22 MED ORDER — FENTANYL CITRATE 0.05 MG/ML IJ SOLN
INTRAMUSCULAR | Status: DC | PRN
  Administered 2014-08-22 (×2): 50 ug via INTRAVENOUS

## 2014-08-22 MED ORDER — INFLUENZA VAC SPLIT QUAD 0.5 ML IM SUSY
0.5000 mL | PREFILLED_SYRINGE | INTRAMUSCULAR | Status: AC
  Administered 2014-08-25: 0.5 mL via INTRAMUSCULAR
  Filled 2014-08-22 (×2): qty 0.5

## 2014-08-22 MED ORDER — VITAMIN B-12 5000 MCG SL SUBL: 5000.0000 ug | SUBLINGUAL_TABLET | Freq: Every day | SUBLINGUAL | Status: DC

## 2014-08-22 MED ORDER — OXYCODONE HCL 5 MG PO TABS: 5.0000 mg | ORAL_TABLET | Freq: Once | ORAL | Status: DC | PRN

## 2014-08-22 MED ORDER — LEVOTHYROXINE SODIUM 125 MCG PO TABS
125.0000 ug | ORAL_TABLET | Freq: Every day | ORAL | Status: DC
  Administered 2014-08-23 – 2014-08-25 (×3): 125 ug via ORAL
  Filled 2014-08-22 (×4): qty 1

## 2014-08-22 MED ORDER — POTASSIUM CHLORIDE CRYS ER 20 MEQ PO TBCR
20.0000 meq | EXTENDED_RELEASE_TABLET | Freq: Every day | ORAL | Status: DC
  Administered 2014-08-22 – 2014-08-25 (×4): 20 meq via ORAL
  Filled 2014-08-22 (×5): qty 1

## 2014-08-22 MED ORDER — PHENYLEPHRINE HCL 10 MG/ML IJ SOLN
10.0000 mg | INTRAVENOUS | Status: DC | PRN
  Administered 2014-08-22: 20 ug/min via INTRAVENOUS

## 2014-08-22 MED ORDER — METOCLOPRAMIDE HCL 5 MG PO TABS
5.0000 mg | ORAL_TABLET | Freq: Three times a day (TID) | ORAL | Status: DC | PRN
  Filled 2014-08-22: qty 2

## 2014-08-22 MED ORDER — ONDANSETRON HCL 4 MG/2ML IJ SOLN
INTRAMUSCULAR | Status: DC | PRN
Start: 2014-08-22 — End: 2014-08-22
  Administered 2014-08-22: 4 mg via INTRAVENOUS

## 2014-08-22 MED ORDER — LORAZEPAM 1 MG PO TABS
1.0000 mg | ORAL_TABLET | Freq: Every day | ORAL | Status: DC
  Administered 2014-08-23 – 2014-08-24 (×2): 1 mg via ORAL
  Filled 2014-08-22 (×2): qty 1

## 2014-08-22 MED ORDER — BUPIVACAINE IN DEXTROSE 0.75-8.25 % IT SOLN
INTRATHECAL | Status: DC | PRN
  Administered 2014-08-22: 2 mL via INTRATHECAL

## 2014-08-22 MED ORDER — ATORVASTATIN CALCIUM 20 MG PO TABS
20.0000 mg | ORAL_TABLET | Freq: Every day | ORAL | Status: DC
  Administered 2014-08-22 – 2014-08-24 (×3): 20 mg via ORAL
  Filled 2014-08-22 (×4): qty 1

## 2014-08-22 MED ORDER — ALUM & MAG HYDROXIDE-SIMETH 200-200-20 MG/5ML PO SUSP
30.0000 mL | ORAL | Status: DC | PRN
  Administered 2014-08-23: 30 mL via ORAL
  Filled 2014-08-22: qty 30

## 2014-08-22 MED ORDER — DOCUSATE SODIUM 100 MG PO CAPS
100.0000 mg | ORAL_CAPSULE | Freq: Two times a day (BID) | ORAL | Status: DC
  Administered 2014-08-22 – 2014-08-25 (×6): 100 mg via ORAL
  Filled 2014-08-22 (×9): qty 1

## 2014-08-22 MED ORDER — TRANEXAMIC ACID 100 MG/ML IV SOLN: 1000.0000 mg | INTRAVENOUS | Status: DC

## 2014-08-22 MED ORDER — BUPIVACAINE LIPOSOME 1.3 % IJ SUSP
20.0000 mL | INTRAMUSCULAR | Status: AC
  Administered 2014-08-22: 20 mL
  Filled 2014-08-22: qty 20

## 2014-08-22 MED ORDER — HYDROCODONE-ACETAMINOPHEN 5-325 MG PO TABS
1.0000 | ORAL_TABLET | ORAL | Status: DC | PRN
  Administered 2014-08-22 – 2014-08-23 (×4): 2 via ORAL
  Administered 2014-08-23: 1 via ORAL
  Administered 2014-08-23 – 2014-08-25 (×6): 2 via ORAL
  Filled 2014-08-22: qty 1
  Filled 2014-08-22 (×3): qty 2
  Filled 2014-08-22: qty 1
  Filled 2014-08-22 (×6): qty 2
  Filled 2014-08-22: qty 1

## 2014-08-22 MED ORDER — METHOCARBAMOL 500 MG PO TABS
500.0000 mg | ORAL_TABLET | Freq: Four times a day (QID) | ORAL | Status: DC | PRN
  Administered 2014-08-23: 500 mg via ORAL
  Filled 2014-08-22: qty 1

## 2014-08-22 MED ORDER — EXEMESTANE 25 MG PO TABS
25.0000 mg | ORAL_TABLET | Freq: Every day | ORAL | Status: DC
  Administered 2014-08-23 – 2014-08-25 (×3): 25 mg via ORAL
  Filled 2014-08-22 (×5): qty 1

## 2014-08-22 MED ORDER — HYDROCHLOROTHIAZIDE 25 MG PO TABS
25.0000 mg | ORAL_TABLET | Freq: Every day | ORAL | Status: DC
  Administered 2014-08-22 – 2014-08-25 (×4): 25 mg via ORAL
  Filled 2014-08-22 (×4): qty 1

## 2014-08-22 MED ORDER — BISACODYL 5 MG PO TBEC: 5.0000 mg | DELAYED_RELEASE_TABLET | Freq: Every day | ORAL | Status: DC | PRN

## 2014-08-22 MED ORDER — TRANEXAMIC ACID 100 MG/ML IV SOLN
2000.0000 mg | INTRAVENOUS | Status: AC
  Administered 2014-08-22: 2000 mg via TOPICAL
  Filled 2014-08-22: qty 20

## 2014-08-22 MED ORDER — LOSARTAN POTASSIUM-HCTZ 100-25 MG PO TABS: 1.0000 | ORAL_TABLET | Freq: Every day | ORAL | Status: DC

## 2014-08-22 MED ORDER — ASPIRIN EC 325 MG PO TBEC
325.0000 mg | DELAYED_RELEASE_TABLET | Freq: Two times a day (BID) | ORAL | Status: DC
  Administered 2014-08-22 – 2014-08-25 (×6): 325 mg via ORAL
  Filled 2014-08-22 (×9): qty 1

## 2014-08-22 MED ORDER — SODIUM CHLORIDE 0.9 % IJ SOLN
INTRAMUSCULAR | Status: DC | PRN
  Administered 2014-08-22: 40 mL via INTRAVENOUS

## 2014-08-22 MED ORDER — ACETAMINOPHEN 650 MG RE SUPP: 650.0000 mg | Freq: Four times a day (QID) | RECTAL | Status: DC | PRN

## 2014-08-22 MED ORDER — CLONAZEPAM 0.5 MG PO TABS: 0.5000 mg | ORAL_TABLET | Freq: Three times a day (TID) | ORAL | Status: DC | PRN

## 2014-08-22 MED ORDER — ACETAMINOPHEN 325 MG PO TABS: 650.0000 mg | ORAL_TABLET | Freq: Four times a day (QID) | ORAL | Status: DC | PRN

## 2014-08-22 SURGICAL SUPPLY — 62 items
BANDAGE ELASTIC 4 VELCRO ST LF (GAUZE/BANDAGES/DRESSINGS) ×3 IMPLANT
BANDAGE ELASTIC 6 VELCRO ST LF (GAUZE/BANDAGES/DRESSINGS) ×3 IMPLANT
BANDAGE ESMARK 6X9 LF (GAUZE/BANDAGES/DRESSINGS) ×1 IMPLANT
BENZOIN TINCTURE PRP APPL 2/3 (GAUZE/BANDAGES/DRESSINGS) ×3 IMPLANT
BLADE SAG 18X100X1.27 (BLADE) IMPLANT
BLADE SAGITTAL 13X1.27X60 (BLADE) IMPLANT
BLADE SAGITTAL 13X1.27X60MM (BLADE)
BLADE SAGITTAL 25.0X1.19X90 (BLADE) IMPLANT
BLADE SAGITTAL 25.0X1.19X90MM (BLADE)
BLADE SURG ROTATE 9660 (MISCELLANEOUS) IMPLANT
BNDG ELASTIC 6X10 VLCR STRL LF (GAUZE/BANDAGES/DRESSINGS) ×3 IMPLANT
BNDG ESMARK 6X9 LF (GAUZE/BANDAGES/DRESSINGS) ×3
BNDG GAUZE ELAST 4 BULKY (GAUZE/BANDAGES/DRESSINGS) ×6 IMPLANT
BOWL SMART MIX CTS (DISPOSABLE) ×3 IMPLANT
CAPT RP KNEE ×3 IMPLANT
CEMENT HV SMART SET (Cement) ×6 IMPLANT
CLOSURE STERI-STRIP 1/2X4 (GAUZE/BANDAGES/DRESSINGS) ×1
CLSR STERI-STRIP ANTIMIC 1/2X4 (GAUZE/BANDAGES/DRESSINGS) ×2 IMPLANT
COVER SURGICAL LIGHT HANDLE (MISCELLANEOUS) ×3 IMPLANT
CUFF TOURNIQUET SINGLE 34IN LL (TOURNIQUET CUFF) ×3 IMPLANT
CUFF TOURNIQUET SINGLE 44IN (TOURNIQUET CUFF) IMPLANT
DRAPE EXTREMITY T 121X128X90 (DRAPE) ×3 IMPLANT
DRAPE PROXIMA HALF (DRAPES) ×3 IMPLANT
DRAPE U-SHAPE 47X51 STRL (DRAPES) ×3 IMPLANT
DRSG ADAPTIC 3X8 NADH LF (GAUZE/BANDAGES/DRESSINGS) ×3 IMPLANT
DRSG PAD ABDOMINAL 8X10 ST (GAUZE/BANDAGES/DRESSINGS) ×3 IMPLANT
DURAPREP 26ML APPLICATOR (WOUND CARE) ×3 IMPLANT
ELECT REM PT RETURN 9FT ADLT (ELECTROSURGICAL) ×3
ELECTRODE REM PT RTRN 9FT ADLT (ELECTROSURGICAL) ×1 IMPLANT
GAUZE SPONGE 4X4 12PLY STRL (GAUZE/BANDAGES/DRESSINGS) ×3 IMPLANT
GLOVE BIO SURGEON STRL SZ8 (GLOVE) ×6 IMPLANT
GLOVE BIOGEL PI IND STRL 8 (GLOVE) ×2 IMPLANT
GLOVE BIOGEL PI INDICATOR 8 (GLOVE) ×4
GOWN STRL REUS W/ TWL LRG LVL3 (GOWN DISPOSABLE) ×1 IMPLANT
GOWN STRL REUS W/ TWL XL LVL3 (GOWN DISPOSABLE) ×2 IMPLANT
GOWN STRL REUS W/TWL LRG LVL3 (GOWN DISPOSABLE) ×2
GOWN STRL REUS W/TWL XL LVL3 (GOWN DISPOSABLE) ×4
HANDPIECE INTERPULSE COAX TIP (DISPOSABLE) ×2
HOOD PEEL AWAY FACE SHEILD DIS (HOOD) ×6 IMPLANT
IMMOBILIZER KNEE 22 UNIV (SOFTGOODS) ×3 IMPLANT
KIT BASIN OR (CUSTOM PROCEDURE TRAY) ×3 IMPLANT
KIT ROOM TURNOVER OR (KITS) ×3 IMPLANT
MANIFOLD NEPTUNE II (INSTRUMENTS) ×3 IMPLANT
NEEDLE HYPO 21X1 ECLIPSE (NEEDLE) ×3 IMPLANT
NS IRRIG 1000ML POUR BTL (IV SOLUTION) ×3 IMPLANT
PACK TOTAL JOINT (CUSTOM PROCEDURE TRAY) ×3 IMPLANT
PAD ARMBOARD 7.5X6 YLW CONV (MISCELLANEOUS) ×6 IMPLANT
SET HNDPC FAN SPRY TIP SCT (DISPOSABLE) ×1 IMPLANT
SPONGE GAUZE 4X4 12PLY STER LF (GAUZE/BANDAGES/DRESSINGS) ×3 IMPLANT
STAPLER VISISTAT 35W (STAPLE) IMPLANT
SUCTION FRAZIER TIP 10 FR DISP (SUCTIONS) IMPLANT
SUT MNCRL AB 3-0 PS2 18 (SUTURE) IMPLANT
SUT VIC AB 0 CT1 27 (SUTURE) ×4
SUT VIC AB 0 CT1 27XBRD ANBCTR (SUTURE) ×2 IMPLANT
SUT VIC AB 2-0 CT1 27 (SUTURE) ×4
SUT VIC AB 2-0 CT1 TAPERPNT 27 (SUTURE) ×2 IMPLANT
SUT VLOC 180 0 24IN GS25 (SUTURE) ×3 IMPLANT
SYR 50ML LL SCALE MARK (SYRINGE) ×3 IMPLANT
TOWEL OR 17X24 6PK STRL BLUE (TOWEL DISPOSABLE) ×3 IMPLANT
TOWEL OR 17X26 10 PK STRL BLUE (TOWEL DISPOSABLE) ×3 IMPLANT
TRAY FOLEY CATH 14FR (SET/KITS/TRAYS/PACK) ×3 IMPLANT
WATER STERILE IRR 1000ML POUR (IV SOLUTION) ×6 IMPLANT

## 2014-08-22 NOTE — Op Note (Signed)
,PREOP DIAGNOSIS: DJD LEFT KNEE POSTOP DIAGNOSIS:  same PROCEDURE: LEFT TKR ANESTHESIA: Spinal and block ATTENDING SURGEON: Ladiamond Gallina G ASSISTANT: Loni Dolly PA  INDICATIONS FOR PROCEDURE: MALISSIA RABBANI is a 80 y.o. female who has struggled for a long time with pain due to degenerative arthritis of the left knee.  The patient has failed many conservative non-operative measures and at this point has pain which limits the ability to sleep and walk.  The patient is offered total knee replacement.  Informed operative consent was obtained after discussion of possible risks of anesthesia, infection, neurovascular injury, DVT, and death.  The importance of the post-operative rehabilitation protocol to optimize result was stressed extensively with the patient.  SUMMARY OF FINDINGS AND PROCEDURE:  ROMIE KEEBLE was taken to the operative suite where under the above anesthesia a left knee replacement was performed.  There were advanced degenerative changes and the bone quality was good.  We used the DePuyLCS system and placed size standard femur, 3 tibia, 35 mm all polyethylene patella, and a size 10 mm spacer.  Loni Dolly PA-C assisted throughout and was invaluable to the completion of the case in that he helped retract and maintain exposure while I placed the components.  He also helped close thereby minimizing OR time.  The patient was admitted for appropriate post-op care to include perioperative antibiotics and mechanical and pharmacologic measures for DVT prophylaxis.  DESCRIPTION OF PROCEDURE:  NAOMEE NOWLAND was taken to the operative suite where the above anesthesia was applied.  The patient was positioned supine and prepped and draped in normal sterile fashion.  An appropriate time out was performed.  After the administration of kefzol pre-op antibiotic the leg was elevated and exsanguinated and a tourniquet inflated.  A standard longitudinal incision was made on the anterior knee.  Dissection was  carried down to the extensor mechanism.  All appropriate anti-infective measures were used including the pre-operative antibiotic, betadine impregnated drape, and closed hooded exhaust systems for each member of the surgical team.  A medial parapatellar incision was made in the extensor mechanism and the knee cap flipped and the knee flexed.  Some residual meniscal tissues were removed along with any remaining ACL/PCL tissue.  A guide was placed on the tibia and a flat cut was made on it's superior surface.  An intramedullary guide was placed in the femur and was utilized to make anterior and posterior cuts creating an appropriate flexion gap.  A second intramedullary guide was placed in the femur to make a distal cut properly balancing the knee with an extension gap equal to the flexion gap.  The three bones sized to the above mentioned sizes and the appropriate guides were placed and utilized.  A trial reduction was done and the knee easily came to full extension and the patella tracked well on flexion.  The trial components were removed and all bones were cleaned with pulsatile lavage and then dried thoroughly.  Cement was mixed and was pressurized onto the bones followed by placement of the aforementioned components.  Excess cement was trimmed and pressure was held on the components until the cement had hardened.  The tourniquet was deflated and a small amount of bleeding was controlled with cautery and pressure.  The knee was irrigated thoroughly.  The extensor mechanism was re-approximated with V-loc suture in running fashion.  The knee was flexed and the repair was solid.  The subcutaneous tissues were re-approximated with #0 and #2-0 vicryl and the skin closed  with a subcuticular stitch and steristrips.  A sterile dressing was applied.  Intraoperative fluids, EBL, and tourniquet time can be obtained from anesthesia records.  DISPOSITION:  The patient was taken to recovery room in stable condition and  admitted for appropriate post-op care to include peri-operative antibiotic and DVT prophylaxis with mechanical and pharmacologic measures.  Bryar Dahms G 08/22/2014, 12:14 PM

## 2014-08-22 NOTE — Interval H&P Note (Signed)
History and Physical Interval Note:  08/22/2014 9:52 AM  Hannah Galvan  has presented today for surgery, with the diagnosis of LEFT KNEE DEGENERATIVE JOINT DISEASE  The various methods of treatment have been discussed with the patient and family. After consideration of risks, benefits and other options for treatment, the patient has consented to  Procedure(s): TOTAL KNEE ARTHROPLASTY (Left) as a surgical intervention .  The patient's history has been reviewed, patient examined, no change in status, stable for surgery.  I have reviewed the patient's chart and labs.  Questions were answered to the patient's satisfaction.     Danon Lograsso G

## 2014-08-22 NOTE — Evaluation (Signed)
Physical Therapy Evaluation Patient Details Name: Hannah Galvan MRN: [id number] DOB: 02-Jul-1931 Today's Date: 08/22/2014   History of Present Illness  Pt admitted for an elective L TKA  Clinical Impression  Pt admitted with/for elective L TKA.  Pt currently limited functionally due to the problems listed. ( See problems list.)   Pt will benefit from PT to maximize function and safety in order to get ready for next venue listed below.     Follow Up Recommendations Home health PT    Equipment Recommendations       Recommendations for Other Services       Precautions / Restrictions Precautions Precautions: Knee Required Braces or Orthoses: Knee Immobilizer - Left Knee Immobilizer - Left: On when out of bed or walking Restrictions Weight Bearing Restrictions: Yes LLE Weight Bearing: Weight bearing as tolerated      Mobility  Bed Mobility Overal bed mobility: Needs Assistance Bed Mobility: Supine to Sit     Supine to sit: Min assist     General bed mobility comments: cues for technique  Transfers Overall transfer level: Needs assistance Equipment used: Rolling walker (2 wheeled) Transfers: Sit to/from Stand Sit to Stand: Min assist         General transfer comment: cues for hand placement; minor lift assist  Ambulation/Gait Ambulation/Gait assistance: Min assist Ambulation Distance (Feet): 180 Feet Assistive device: Rolling walker (2 wheeled) Gait Pattern/deviations: Step-through pattern Gait velocity: functional   General Gait Details: cues for sequencing and help maneuvering the RW  Stairs            Wheelchair Mobility    Modified Rankin (Stroke Patients Only)       Balance Overall balance assessment: No apparent balance deficits (not formally assessed)                                           Pertinent Vitals/Pain Pain Assessment: 0-10 Pain Score: 5  Pain Location: L knee Pain Descriptors / Indicators:  Guarding;Sharp Pain Intervention(s): Premedicated before session    Home Living Family/patient expects to be discharged to:: Skilled nursing facility Living Arrangements: Alone                    Prior Function Level of Independence: Independent               Hand Dominance        Extremity/Trunk Assessment   Upper Extremity Assessment: Defer to OT evaluation           Lower Extremity Assessment: Overall WFL for tasks assessed;LLE deficits/detail   LLE Deficits / Details: Knee flexion AAROM 65*     Communication   Communication: No difficulties  Cognition Arousal/Alertness: Awake/alert Behavior During Therapy: WFL for tasks assessed/performed Overall Cognitive Status: Within Functional Limits for tasks assessed                      General Comments      Exercises Total Joint Exercises Ankle Circles/Pumps: AROM;20 reps Quad Sets: AROM;Both;10 reps;Supine Gluteal Sets: AROM;Both;10 reps;Supine Heel Slides: AAROM;Left;10 reps;Supine Goniometric ROM: 65*      Assessment/Plan    PT Assessment Patient needs continued PT services  PT Diagnosis Abnormality of gait;Acute pain   PT Problem List Decreased strength;Decreased range of motion;Decreased activity tolerance;Decreased mobility;Decreased knowledge of use of DME;Decreased knowledge of precautions;Pain  PT Treatment Interventions  DME instruction;Gait training;Functional mobility training;Therapeutic activities;Therapeutic exercise;Patient/family education   PT Goals (Current goals can be found in the Care Plan section) Acute Rehab PT Goals Patient Stated Goal: independent  PT Goal Formulation: With patient Time For Goal Achievement: 08/22/14 Potential to Achieve Goals: Good    Frequency 7X/week   Barriers to discharge Decreased caregiver support      Co-evaluation               End of Session Equipment Utilized During Treatment: Left knee immobilizer Activity Tolerance:  Patient tolerated treatment well Patient left: Other (comment);with call bell/phone within reach (sitting EOB for dinner) Nurse Communication: Mobility status         Time: 1630-1705 PT Time Calculation (min): 35 min   Charges:   PT Evaluation $Initial PT Evaluation Tier I: 1 Procedure PT Treatments $Gait Training: 8-22 mins $Therapeutic Exercise: 8-22 mins   PT G Codes:          Avi Archuleta, Tessie Fass 08/22/2014, 5:27 PM 08/22/2014  Donnella Sham, PT [phone]  (pager)

## 2014-08-22 NOTE — Transfer of Care (Signed)
Immediate Anesthesia Transfer of Care Note  Patient: Hannah Galvan  Procedure(s) Performed: Procedure(s): TOTAL KNEE ARTHROPLASTY (Left)  Patient Location: PACU  Anesthesia Type:MAC and Spinal  Level of Consciousness: awake, alert  and oriented  Airway & Oxygen Therapy: Patient Spontanous Breathing and Patient connected to nasal cannula oxygen  Post-op Assessment: Report given to PACU RN and Post -op Vital signs reviewed and stable  Post vital signs: Reviewed and stable  Complications: No apparent anesthesia complications

## 2014-08-22 NOTE — Progress Notes (Signed)
Orthopedic Tech Progress Note Patient Details:  Hannah Galvan January 25, 1931 [id number]  CPM Left Knee CPM Left Knee: On Left Knee Flexion (Degrees): 60 Left Knee Extension (Degrees): 0 Additional Comments: patient refused ohf footsie roll on bed  Ortho Devices Ortho Device/Splint Location: footsie roll Ortho Device/Splint Interventions: Criss Alvine 08/22/2014, 3:32 PM

## 2014-08-22 NOTE — Progress Notes (Signed)
Orthopedic Tech Progress Note Patient Details:  Hannah Galvan [birth year] [id number] On cpm at 7:40 pm LLE  Patient ID: [name], female   DOB: 11/23/1931, 80 y.o.   MRN: [id number]   Braulio Bosch 08/22/2014, 8:02 PM

## 2014-08-22 NOTE — Anesthesia Procedure Notes (Addendum)
Spinal  Patient location during procedure: OR Start time: 08/22/2014 10:40 AM End time: 08/22/2014 10:45 AM Staffing Anesthesiologist: Suzette Battiest E Performed by: anesthesiologist  Preanesthetic Checklist Completed: patient identified, site marked, surgical consent, pre-op evaluation, timeout performed, IV checked, risks and benefits discussed and monitors and equipment checked Spinal Block Patient position: right lateral decubitus Prep: Betadine and site prepped and draped Patient monitoring: heart rate, cardiac monitor, continuous pulse ox and blood pressure Approach: midline Location: L3-4 Injection technique: single-shot Needle Needle type: Quincke  Needle gauge: 22 G Needle length: 9 cm Additional Notes Free flowing CSF before and after injection. 2cc's 0.75% bupivicaine injected intrathecally. Pt tolerated procedure well without apparent complications.  Procedure Name: MAC Date/Time: 08/22/2014 10:43 AM Performed by: Jacob Moores Pre-anesthesia Checklist: Patient identified, Emergency Drugs available, Suction available and Patient being monitored Patient Re-evaluated:Patient Re-evaluated prior to inductionOxygen Delivery Method: Simple face mask Preoxygenation: Pre-oxygenation with 100% oxygen Intubation Type: IV induction Placement Confirmation: positive ETCO2 and breath sounds checked- equal and bilateral Dental Injury: Teeth and Oropharynx as per pre-operative assessment

## 2014-08-22 NOTE — Interval H&P Note (Signed)
History and Physical Interval Note:  08/22/2014 9:52 AM  Hannah Galvan  has presented today for surgery, with the diagnosis of LEFT KNEE DEGENERATIVE JOINT DISEASE  The various methods of treatment have been discussed with the patient and family. After consideration of risks, benefits and other options for treatment, the patient has consented to  Procedure(s): TOTAL KNEE ARTHROPLASTY (Left) as a surgical intervention .  The patient's history has been reviewed, patient examined, no change in status, stable for surgery.  I have reviewed the patient's chart and labs.  Questions were answered to the patient's satisfaction.     [name]

## 2014-08-22 NOTE — Anesthesia Postprocedure Evaluation (Signed)
  Anesthesia Post-op Note  Patient: Hannah Galvan  Procedure(s) Performed: Procedure(s): TOTAL KNEE ARTHROPLASTY (Left)  Patient Location: PACU  Anesthesia Type:Spinal  Level of Consciousness: awake, alert  and oriented  Airway and Oxygen Therapy: Patient Spontanous Breathing  Post-op Pain: none  Post-op Assessment: Post-op Vital signs reviewed  Post-op Vital Signs: Reviewed  Last Vitals:  Filed Vitals:   08/22/14 1432  BP:   Pulse:   Temp: 36.2 C  Resp:     Complications: No apparent anesthesia complications

## 2014-08-22 NOTE — Anesthesia Preprocedure Evaluation (Addendum)
Anesthesia Evaluation  Patient identified by MRN, date of birth, ID band Patient awake    Reviewed: Allergy & Precautions, H&P , NPO status , Patient's Chart, lab work & pertinent test results  Airway Mallampati: II TM Distance: >3 FB Neck ROM: Full    Dental  (+) Dental Advisory Given, Edentulous Upper, Edentulous Lower   Pulmonary neg pulmonary ROS,  breath sounds clear to auscultation        Cardiovascular hypertension, Pt. on medications Rhythm:Regular Rate:Normal     Neuro/Psych PSYCHIATRIC DISORDERS Anxiety negative neurological ROS     GI/Hepatic negative GI ROS, Neg liver ROS,   Endo/Other  Hypothyroidism   Renal/GU negative Renal ROS     Musculoskeletal  (+) Arthritis -,   Abdominal   Peds  Hematology negative hematology ROS (+)   Anesthesia Other Findings   Reproductive/Obstetrics negative OB ROS                       Anesthesia Physical Anesthesia Plan  ASA: II  Anesthesia Plan: Spinal and MAC   Post-op Pain Management:    Induction: Intravenous  Airway Management Planned: Simple Face Mask and Natural Airway  Additional Equipment:   Intra-op Plan:   Post-operative Plan:   Informed Consent: I have reviewed the patients History and Physical, chart, labs and discussed the procedure including the risks, benefits and alternatives for the proposed anesthesia with the patient or authorized representative who has indicated his/her understanding and acceptance.   Dental advisory given  Plan Discussed with: CRNA, Anesthesiologist and Surgeon  Anesthesia Plan Comments:      Anesthesia Quick Evaluation

## 2014-08-22 NOTE — Progress Notes (Signed)
Utilization review completed.  

## 2014-08-23 ENCOUNTER — Encounter (HOSPITAL_COMMUNITY): Payer: Self-pay | Admitting: Orthopaedic Surgery

## 2014-08-23 LAB — BASIC METABOLIC PANEL
Anion gap: 11 (ref 5–15)
BUN: 11 mg/dL (ref 6–23)
CO2: 28 mEq/L (ref 19–32)
CREATININE: 0.74 mg/dL (ref 0.50–1.10)
Calcium: 8.9 mg/dL (ref 8.4–10.5)
Chloride: 95 mEq/L — ABNORMAL LOW (ref 96–112)
GFR calc Af Amer: >90 mL/min (ref >90)
GFR, EST NON AFRICAN AMERICAN: 78 mL/min — AB (ref >90)
Glucose, Bld: 109 mg/dL — ABNORMAL HIGH (ref 70–99)
Potassium: 4.1 mEq/L (ref 3.7–5.3)
Sodium: 134 mEq/L — ABNORMAL LOW (ref 137–147)

## 2014-08-23 LAB — CBC
HEMATOCRIT: 35.8 % — AB (ref 36.0–46.0)
HEMOGLOBIN: 12.3 g/dL (ref 12.0–15.0)
MCH: 31.6 pg (ref 26.0–34.0)
MCHC: 34.4 g/dL (ref 30.0–36.0)
MCV: 92 fL (ref 78.0–100.0)
Platelets: 182 10*3/uL (ref 150–400)
RBC: 3.89 MIL/uL (ref 3.87–5.11)
RDW: 13 % (ref 11.5–15.5)
WBC: 12 10*3/uL — ABNORMAL HIGH (ref 4.0–10.5)

## 2014-08-23 MED ORDER — PANTOPRAZOLE SODIUM 40 MG PO TBEC
40.0000 mg | DELAYED_RELEASE_TABLET | Freq: Every day | ORAL | Status: DC
  Administered 2014-08-23 – 2014-08-25 (×3): 40 mg via ORAL
  Filled 2014-08-23 (×3): qty 1

## 2014-08-23 NOTE — Clinical Social Work Psychosocial (Signed)
Clinical Social Work Department BRIEF PSYCHOSOCIAL ASSESSMENT 08/23/2014  Patient:  Hannah Galvan, Hannah Galvan     Account Number:  [id number]     Admit date:  08/22/2014  Clinical Social Worker:  Wylene Men  Date/Time:  08/23/2014 11:12 AM  Referred by:  Physician  Date Referred:  08/23/2014 Referred for  SNF Placement  Psychosocial assessment   Other Referral:   none   Interview type:  Patient Other interview type:   none    PSYCHOSOCIAL DATA Living Status:  ALONE Admitted from facility:   Level of care:   Primary support name:  [name] Primary support relationship to patient:  CHILD, ADULT Degree of support available:   adequate    CURRENT CONCERNS Current Concerns  Post-Acute Placement   Other Concerns:   none    SOCIAL WORK ASSESSMENT / PLAN CSW assessed pt at bedside.  Pt son, [name] was also at bedside and remained during the assessment per pt request. PT recommends SNF and has reviewed this recommendation with pt.  Pt has not been to a facility for STR in the past and is unfamiliar with the process but states that she has completed pre-registration paperwork with [facility]. CSW will contact [facility] and send appropriate clinical informaiton for smooth transition.  Pt states she will need transportation once medically discharged.  CSW will continue to assist   Assessment/plan status:  Psychosocial Support/Ongoing Assessment of Needs Other assessment/ plan:   FL2  PASARR   Information/referral to community resources:   SNF    PATIENT'S/FAMILY'S RESPONSE TO PLAN OF CARE: Pt is agreeable to SNF and has completed pre-registration paperwork with [employer]  CSW to continue to assist.       Nonnie Done, [place] [phone]  Psychiatric & Orthopedics (5N 1-16) Clinical Social Worker

## 2014-08-23 NOTE — Progress Notes (Signed)
Subjective: 1 Day Post-Op Procedure(s) (LRB): TOTAL KNEE ARTHROPLASTY (Left)  Activity level:  Weight bearing as tolerated  Diet tolerance: eating well Voiding:  Foley removed this am Patient reports pain as mild and moderate.    Objective: Vital signs in last 24 hours: Temp:  [97 F (36.1 C)-99.9 F (37.7 C)] 99.7 F (37.6 C) (09/30 0600) Pulse Rate:  [60-93] 88 (09/30 0600) Resp:  [12-17] 16 (09/30 0600) BP: (106-138)/(59-79) 126/61 mmHg (09/30 0600) SpO2:  [89 %-100 %] 97 % (09/30 0600) Weight:  [68.04 kg (150 lb)] 68.04 kg (150 lb) (09/29 0832)  Labs:  Recent Labs  08/23/14 0540  HGB 12.3    Recent Labs  08/23/14 0540  WBC 12.0*  RBC 3.89  HCT 35.8*  PLT 182    Recent Labs  08/23/14 0540  NA 134*  K 4.1  CL 95*  CO2 28  BUN 11  CREATININE 0.74  GLUCOSE 109*  CALCIUM 8.9   No results found for this basename: LABPT, INR,  in the last 72 hours  Physical Exam:  Neurologically intact ABD soft Neurovascular intact Sensation intact distally Intact pulses distally Dorsiflexion/Plantar flexion intact Incision: scant drainage No cellulitis present Compartment soft  Assessment/Plan:  1 Day Post-Op Procedure(s) (LRB): TOTAL KNEE ARTHROPLASTY (Left) Advance diet Up with therapy D/C IV fluids Plan for discharge tomorrow Discharge to SNF  When doing well and cleared by PT  Continue on ASA 325mg  BID X2 weeks for DVT prophylaxis  Dressing changed to Aquacel today Follow up in 2 weeks post op   Suezette Lafave PAUL 08/23/2014, 7:54 AM

## 2014-08-23 NOTE — Evaluation (Signed)
Occupational Therapy Evaluation Patient Details Name: Hannah Galvan MRN: [id number] DOB: 1931/09/28 Today's Date: 08/23/2014    History of Present Illness Pt admitted for an elective L TKA   Clinical Impression   Pt admitted with the above diagnoses and presents with below problem list. Pt will benefit from continued acute OT to address the below listed deficits and maximize independence with basic ADLs prior to d/c to next venue. PTA pt was independent with ADLs. Pt currently at mod A level for LB ADLs and functional transfers.       Follow Up Recommendations  SNF    Equipment Recommendations  Other (comment) (defer to next venue)    Recommendations for Other Services       Precautions / Restrictions Precautions Precautions: Knee Precaution Comments: reviewed precautions Required Braces or Orthoses: Knee Immobilizer - Left Knee Immobilizer - Left: On when out of bed or walking Restrictions Weight Bearing Restrictions: Yes LLE Weight Bearing: Weight bearing as tolerated      Mobility Bed Mobility      General bed mobility comments: in recliner  Transfers Overall transfer level: Needs assistance Equipment used: Rolling walker (2 wheeled) Transfers: Sit to/from Stand Sit to Stand: Mod assist         General transfer comment: cues for technique. extra time. Mod A to power up trunk. sit<>stand from recliner.    Balance Overall balance assessment: Needs assistance Sitting-balance support: No upper extremity supported;Feet supported Sitting balance-Leahy Scale: Fair     Standing balance support: Bilateral upper extremity supported;During functional activity Standing balance-Leahy Scale: Poor Standing balance comment: cues for standing tall second to trunk flexion                            ADL Overall ADL's : Needs assistance/impaired Eating/Feeding: Set up;Sitting   Grooming: Set up;Sitting   Upper Body Bathing: Sitting;Supervision/ safety    Lower Body Bathing: Moderate assistance;Sit to/from stand;With adaptive equipment   Upper Body Dressing : Supervision/safety;Sitting   Lower Body Dressing: Moderate assistance;Sit to/from stand;With adaptive equipment   Toilet Transfer: Moderate assistance;BSC;RW;Ambulation   Toileting- Clothing Manipulation and Hygiene: Moderate assistance;Sit to/from stand   Tub/ Banker: Moderate assistance;Stand-pivot;3 in 1;Rolling walker     General ADL Comments: Pt completed sit<>stand with mod A, extra time, and cues for technique. Educated on basic ADL techniques.      Vision                     Perception     Praxis      Pertinent Vitals/Pain Pain Assessment: 0-10 Pain Score: 8  Pain Location: L knee Pain Descriptors / Indicators: Sharp Pain Intervention(s): Limited activity within patient's tolerance;Monitored during session;Patient requesting pain meds-RN notified;Utilized relaxation techniques;Ice applied     Hand Dominance     Extremity/Trunk Assessment Upper Extremity Assessment Upper Extremity Assessment: Generalized weakness   Lower Extremity Assessment Lower Extremity Assessment: Overall WFL for tasks assessed       Communication Communication Communication: No difficulties   Cognition Arousal/Alertness: Awake/alert Behavior During Therapy: WFL for tasks assessed/performed Overall Cognitive Status: Within Functional Limits for tasks assessed                     General Comments       Exercises       Shoulder Instructions      Home Living Family/patient expects to be discharged to:: Skilled nursing facility  Living Arrangements: Alone                                      Prior Functioning/Environment Level of Independence: Independent             OT Diagnosis: Acute pain;Generalized weakness   OT Problem List: Impaired balance (sitting and/or standing);Decreased knowledge of use of DME or AE;Decreased  knowledge of precautions;Pain   OT Treatment/Interventions: Self-care/ADL training;Therapeutic exercise;Energy conservation;DME and/or AE instruction;Therapeutic activities;Patient/family education;Balance training    OT Goals(Current goals can be found in the care plan section) Acute Rehab OT Goals Patient Stated Goal: independent  OT Goal Formulation: With patient Time For Goal Achievement: 08/30/14 Potential to Achieve Goals: Good ADL Goals Pt Will Perform Lower Body Bathing: with min guard assist;with adaptive equipment;sit to/from stand Pt Will Perform Lower Body Dressing: with min guard assist;with adaptive equipment;sit to/from stand Pt Will Transfer to Toilet: with min guard assist;ambulating (3n1 over toilet) Pt Will Perform Toileting - Clothing Manipulation and hygiene: with min guard assist;sit to/from stand Pt Will Perform Tub/Shower Transfer: with min guard assist;ambulating;3 in 1;rolling walker  OT Frequency: Min 3X/week   Barriers to D/C: Decreased caregiver support  lives alone       Co-evaluation              End of Session Equipment Utilized During Treatment: Gait belt;Rolling walker;Left knee immobilizer;Oxygen CPM Left Knee CPM Left Knee: Off Nurse Communication: Patient requests pain meds  Activity Tolerance: Patient limited by pain;Patient tolerated treatment well Patient left: in chair;with call bell/phone within reach;Other (comment) (with rolled towel under L ankle)   Time: 1001-1029 OT Time Calculation (min): 28 min Charges:  OT General Charges $OT Visit: 1 Procedure OT Evaluation $Initial OT Evaluation Tier I: 1 Procedure OT Treatments $Self Care/Home Management : 8-22 mins G-Codes:    Hortencia Pilar 09/05/2014, 12:03 PM

## 2014-08-23 NOTE — Progress Notes (Signed)
Orthopedic Tech Progress Note Patient Details:  Hannah Galvan 04-06-1931 [id number]  Ortho Devices Ortho Device/Splint Location: footsie roll Ortho Device/Splint Interventions: Ordered Placed pt's lle in cpm @ 0-80 degrees @1505 ; will increase as pt tolerates; rn notified  Margaruite Top 08/23/2014, 3:10 PM

## 2014-08-23 NOTE — Clinical Social Work Placement (Addendum)
Clinical Social Work Department CLINICAL SOCIAL WORK PLACEMENT NOTE 08/23/2014  Patient:  Hannah Galvan, Hannah Galvan  Account Number:  [id number] Admit date:  08/22/2014  Clinical Social Worker:  Wylene Men  Date/time:  08/23/2014 11:16 AM  Clinical Social Work is seeking post-discharge placement for this patient at the following level of care:   SKILLED NURSING   (*CSW will update this form in Epic as items are completed)   08/23/2014  Patient/family provided with [place] Department of Clinical Social Work's list of facilities offering this level of care within the geographic area requested by the patient (or if unable, by the patient's family).  08/23/2014  Patient/family informed of their freedom to choose among providers that offer the needed level of care, that participate in Medicare, Medicaid or managed care program needed by the patient, have an available bed and are willing to accept the patient.  08/23/2014  Patient/family informed of [facility]' ownership interest in [facility], as well as of the fact that they are under no obligation to receive care at this facility.  PASARR submitted to EDS on 08/23/2014 PASARR number received on 08/23/2014  FL2 transmitted to all facilities in geographic area requested by pt/family on  08/23/2014 FL2 transmitted to all facilities within larger geographic area on   Patient informed that his/her managed care company has contracts with or will negotiate with  certain facilities, including the following:     Patient/family informed of bed offers received:  08/13/2014 Patient chooses bed at [facility], SNF Physician recommends and patient chooses bed at  n/a  Patient to be transferred to  [facility] on  08/24/2014 Patient to be transferred to facility by PTAR Patient and family notified of transfer on 08/24/2014 Name of family member notified:  Pt is alert and oriented x4.  Pt (along with family at bedside) was updated by RN  regarding discharge plans for today. Pt and family agreeable to this plan.  The following physician request were entered in Epic:   Additional Comments:   Nonnie Done, [place] [phone]  Psychiatric & Orthopedics (5N 1-16) Clinical Social Worker

## 2014-08-23 NOTE — Progress Notes (Signed)
aquacell

## 2014-08-23 NOTE — Progress Notes (Addendum)
Physical Therapy Treatment Patient Details Name: Hannah Galvan MRN: [id number] DOB: [birth year] Today's Date: 08/23/2014    History of Present Illness Pt admitted for an elective L TKA    PT Comments    Patient feeling a little more sore this morning and having slightly increase pain. Patient is planning to DC to [facility] to continue therapy when medically ready. Patient lives alone  Follow Up Recommendations  SNF     Equipment Recommendations  Rolling walker with 5" wheels    Recommendations for Other Services       Precautions / Restrictions Precautions Precautions: Knee Required Braces or Orthoses: Knee Immobilizer - Left Knee Immobilizer - Left: On when out of bed or walking Restrictions Weight Bearing Restrictions: Yes LLE Weight Bearing: Weight bearing as tolerated    Mobility  Bed Mobility Overal bed mobility: Needs Assistance Bed Mobility: Supine to Sit     Supine to sit: Min assist     General bed mobility comments: cues for technique. A for trunk support into sitting and use of chuck pad to scoot EOB  Transfers Overall transfer level: Needs assistance Equipment used: Rolling walker (2 wheeled)   Sit to Stand: Mod assist         General transfer comment: A to power up into standing and to facilitate anterior weight shift. Cues for safe hand placement and technique  Ambulation/Gait Ambulation/Gait assistance: Min assist Ambulation Distance (Feet): 120 Feet Assistive device: Rolling walker (2 wheeled) Gait Pattern/deviations: Step-to pattern;Decreased stance time - left;Decreased step length - right   Gait velocity interpretation: Below normal speed for age/gender General Gait Details: Cues for RW management and positioning. A for balance initially as patient with slight posterior lean   Stairs            Wheelchair Mobility    Modified Rankin (Stroke Patients Only)       Balance                                     Cognition Arousal/Alertness: Awake/alert Behavior During Therapy: WFL for tasks assessed/performed Overall Cognitive Status: Within Functional Limits for tasks assessed                      Exercises Total Joint Exercises Quad Sets: AROM;Both;10 reps;Supine Short Arc Quad: AAROM;Left;10 reps Heel Slides: AAROM;Left;10 reps;Supine Hip ABduction/ADduction: AAROM;Left;10 reps Straight Leg Raises: AAROM;Left;10 reps    General Comments        Pertinent Vitals/Pain Pain Score: 6  Pain Location: L knee Pain Descriptors / Indicators: Guarding Pain Intervention(s): Monitored during session;Limited activity within patient's tolerance    Home Living                      Prior Function            PT Goals (current goals can now be found in the care plan section) Progress towards PT goals: Progressing toward goals    Frequency  7X/week    PT Plan Discharge plan needs to be updated    Co-evaluation             End of Session Equipment Utilized During Treatment: Left knee immobilizer Activity Tolerance: Patient tolerated treatment well Patient left: in chair;with call bell/phone within reach     Time: 0826-0850 PT Time Calculation (min): 24 min  Charges:  $Gait Training: 8-22 mins $Therapeutic  Exercise: 8-22 mins                    G Codes:      Jacqualyn Posey 08/23/2014, 8:56 AM 08/23/2014 Jacqualyn Posey PTA [phone] pager [phone] office

## 2014-08-23 NOTE — Progress Notes (Signed)
Agree with PTA.    Sunshyne Horvath, PT [phone]  

## 2014-08-24 LAB — CBC
HCT: 33.4 % — ABNORMAL LOW (ref 36.0–46.0)
HEMOGLOBIN: 11.4 g/dL — AB (ref 12.0–15.0)
MCH: 32.2 pg (ref 26.0–34.0)
MCHC: 34.1 g/dL (ref 30.0–36.0)
MCV: 94.4 fL (ref 78.0–100.0)
PLATELETS: 164 10*3/uL (ref 150–400)
RBC: 3.54 MIL/uL — ABNORMAL LOW (ref 3.87–5.11)
RDW: 12.8 % (ref 11.5–15.5)
WBC: 13.4 10*3/uL — ABNORMAL HIGH (ref 4.0–10.5)

## 2014-08-24 NOTE — Progress Notes (Signed)
Orthopedic Tech Progress Note Patient Details:  [name] [birth year] [id number] On cpm at 7:30 pm 0-80 Patient ID: Hannah Galvan, female   DOB: [birth year], 80 y.o.   MRN: [id number]   Hannah Galvan 08/24/2014, 7:34 PM

## 2014-08-24 NOTE — Progress Notes (Signed)
Physical Therapy Treatment Patient Details Name: Hannah Galvan MRN: [id number] DOB: [birth year] Today's Date: 08/24/2014    History of Present Illness Pt admitted for an elective L TKA    PT Comments    Patient making great progress today with ambulate and therex. She is not having as much pain as yesterday. Continue to recommend SNF for ongoing Physical Therapy.     Follow Up Recommendations  SNF     Equipment Recommendations  Rolling walker with 5" wheels    Recommendations for Other Services       Precautions / Restrictions Precautions Precautions: Knee Precaution Comments: reviewed precautions Required Braces or Orthoses: Knee Immobilizer - Left Knee Immobilizer - Left: On when out of bed or walking Restrictions LLE Weight Bearing: Weight bearing as tolerated    Mobility  Bed Mobility Overal bed mobility: Needs Assistance Bed Mobility: Supine to Sit     Supine to sit: Min guard     General bed mobility comments: [place] for safety. patient able to move L knee off bed without assistance  Transfers Overall transfer level: Needs assistance Equipment used: Rolling walker (2 wheeled)   Sit to Stand: Min assist         General transfer comment: Min A to power up into standing. Cues for hand placement  Ambulation/Gait Ambulation/Gait assistance: Min guard Ambulation Distance (Feet): 200 Feet Assistive device: Rolling walker (2 wheeled) Gait Pattern/deviations: Step-through pattern;Decreased stride length     General Gait Details: Cues for RW management and positioning. Cues to control RW and ambulation speed as she sevres and gets too quick with gait   Stairs            Wheelchair Mobility    Modified Rankin (Stroke Patients Only)       Balance                                    Cognition Arousal/Alertness: Awake/alert Behavior During Therapy: WFL for tasks assessed/performed Overall Cognitive Status: Within Functional  Limits for tasks assessed                      Exercises Total Joint Exercises Quad Sets: Left;AROM Short Arc QuadSinclair Ship;Left;10 reps Heel Slides: AAROM;Left;10 reps Hip ABduction/ADduction: AAROM;Left;10 reps Straight Leg Raises: AAROM;Left;10 reps    General Comments        Pertinent Vitals/Pain Pain Score: 5  Pain Location: L knee Pain Descriptors / Indicators: Sore Pain Intervention(s): Monitored during session    Home Living                      Prior Function            PT Goals (current goals can now be found in the care plan section) Progress towards PT goals: Progressing toward goals    Frequency  7X/week    PT Plan Current plan remains appropriate    Co-evaluation             End of Session Equipment Utilized During Treatment: Left knee immobilizer Activity Tolerance: Patient tolerated treatment well Patient left: in chair;with call bell/phone within reach     Time: 1040-1106 PT Time Calculation (min): 26 min  Charges:  $Gait Training: 8-22 mins $Therapeutic Exercise: 8-22 mins                    G Codes:  Jacqualyn Posey 08/24/2014, 11:17 AM  08/24/2014 Jacqualyn Posey PTA [phone] pager [phone] office

## 2014-08-24 NOTE — Progress Notes (Signed)
Orthopedic Tech Progress Note Patient Details:  Hannah Galvan 08-27-1931 [id number] Patient placed in CPM-1500 rounds CPM Left Knee CPM Left Knee: On Left Knee Flexion (Degrees): 80 Left Knee Extension (Degrees): 0 Additional Comments: completed 2 nhours   [place] R Thompson 08/24/2014, 2:45 PM

## 2014-08-24 NOTE — Progress Notes (Signed)
Subjective: 2 Days Post-Op Procedure(s) (LRB): TOTAL KNEE ARTHROPLASTY (Left)  Activity level:  WBAT  Diet tolerance:  Eating well Voiding:  ok Patient reports pain as mild.    Objective: Vital signs in last 24 hours: Temp:  [98.4 F (36.9 C)-98.6 F (37 C)] 98.4 F (36.9 C) (10/01 0541) Pulse Rate:  [81-83] 83 (10/01 0541) Resp:  [16] 16 (10/01 0541) BP: (118-122)/(57-67) 122/67 mmHg (10/01 0541) SpO2:  [93 %-96 %] 96 % (10/01 0541)  Labs:  Recent Labs  08/23/14 0540 08/24/14 0550  HGB 12.3 11.4*    Recent Labs  08/23/14 0540 08/24/14 0550  WBC 12.0* 13.4*  RBC 3.89 3.54*  HCT 35.8* 33.4*  PLT 182 164    Recent Labs  08/23/14 0540  NA 134*  K 4.1  CL 95*  CO2 28  BUN 11  CREATININE 0.74  GLUCOSE 109*  CALCIUM 8.9   No results found for this basename: LABPT, INR,  in the last 72 hours  Physical Exam:  Neurologically intact ABD soft Neurovascular intact Sensation intact distally Intact pulses distally Dorsiflexion/Plantar flexion intact Incision: dressing C/D/I and scant drainage No cellulitis present Compartment soft  Assessment/Plan:  2 Days Post-Op Procedure(s) (LRB): TOTAL KNEE ARTHROPLASTY (Left) Advance diet Up with therapy Plan for discharge tomorrow Discharge to SNF [facility] ASA 325mg  BID for 2wks post op for DVT prevention Follow up in the office in 2 wks post op     Hannah Galvan, Larwance Sachs 08/24/2014, 2:04 PM

## 2014-08-25 DIAGNOSIS — M1712 Unilateral primary osteoarthritis, left knee: Secondary | ICD-10-CM | POA: Diagnosis not present

## 2014-08-25 LAB — CBC
HCT: 35.4 % — ABNORMAL LOW (ref 36.0–46.0)
Hemoglobin: 12 g/dL (ref 12.0–15.0)
MCH: 31.4 pg (ref 26.0–34.0)
MCHC: 33.9 g/dL (ref 30.0–36.0)
MCV: 92.7 fL (ref 78.0–100.0)
Platelets: 203 10*3/uL (ref 150–400)
RBC: 3.82 MIL/uL — ABNORMAL LOW (ref 3.87–5.11)
RDW: 13.1 % (ref 11.5–15.5)
WBC: 11.9 10*3/uL — AB (ref 4.0–10.5)

## 2014-08-25 MED ORDER — METHOCARBAMOL 500 MG PO TABS: 500.0000 mg | ORAL_TABLET | Freq: Four times a day (QID) | ORAL | Status: DC | PRN

## 2014-08-25 MED ORDER — ASPIRIN 325 MG PO TBEC: 325.0000 mg | DELAYED_RELEASE_TABLET | Freq: Two times a day (BID) | ORAL | Status: DC

## 2014-08-25 MED ORDER — HYDROCODONE-ACETAMINOPHEN 5-325 MG PO TABS: 1.0000 | ORAL_TABLET | Freq: Four times a day (QID) | ORAL | Status: DC | PRN

## 2014-08-25 NOTE — Progress Notes (Signed)
Subjective: 3 Days Post-Op Procedure(s) (LRB): TOTAL KNEE ARTHROPLASTY (Left)  Activity level:  wbat Diet tolerance:  Eating well Voiding:  ok Patient reports pain as mild.    Objective: Vital signs in last 24 hours: Temp:  [98.2 F (36.8 C)-99.5 F (37.5 C)] 98.2 F (36.8 C) (10/02 0624) Pulse Rate:  [87-98] 87 (10/02 0624) Resp:  [17-18] 17 (10/01 2000) BP: (106-148)/(52-126) 148/126 mmHg (10/02 0624) SpO2:  [91 %-95 %] 91 % (10/02 0624)  Labs:  Recent Labs  08/23/14 0540 08/24/14 0550 08/25/14 0400  HGB 12.3 11.4* 12.0    Recent Labs  08/24/14 0550 08/25/14 0400  WBC 13.4* 11.9*  RBC 3.54* 3.82*  HCT 33.4* 35.4*  PLT 164 203    Recent Labs  08/23/14 0540  NA 134*  K 4.1  CL 95*  CO2 28  BUN 11  CREATININE 0.74  GLUCOSE 109*  CALCIUM 8.9   No results found for this basename: LABPT, INR,  in the last 72 hours  Physical Exam:  Neurologically intact ABD soft Neurovascular intact Sensation intact distally Intact pulses distally Dorsiflexion/Plantar flexion intact Incision: scant drainage No cellulitis present Compartment soft  Assessment/Plan:  3 Days Post-Op Procedure(s) (LRB): TOTAL KNEE ARTHROPLASTY (Left) Advance diet Up with therapy Discharge to SNF today after PT this morning. Dressing changed again today. Continue on ASA 325mg  BID x 2 weeks post op for DVT prevention. Follow up in office 2 weeks post op.     Hannah Galvan, Larwance Sachs 08/25/2014, 7:44 AM

## 2014-08-25 NOTE — Progress Notes (Signed)
Physical Therapy Treatment Patient Details Name: Hannah Galvan MRN: [id number] DOB: 11-15-1931 Today's Date: 08/25/2014    History of Present Illness Pt admitted for an elective L TKA    PT Comments    Patient progressing well with overall mobility. Planning to Dc to [facility] later today for more therapy to increased functional independence prior to returning home.   Follow Up Recommendations  SNF     Equipment Recommendations  Rolling walker with 5" wheels    Recommendations for Other Services       Precautions / Restrictions Precautions Precautions: Knee Precaution Comments: reviewed precautions Required Braces or Orthoses: Knee Immobilizer - Left Knee Immobilizer - Left: On when out of bed or walking Restrictions Weight Bearing Restrictions: Yes LLE Weight Bearing: Weight bearing as tolerated    Mobility  Bed Mobility               General bed mobility comments: Patient up in recliner before and after session  Transfers Overall transfer level: Needs assistance Equipment used: Rolling walker (2 wheeled) Transfers: Sit to/from Stand           General transfer comment: Min A to power up into standing. Cues for hand placement  Ambulation/Gait Ambulation/Gait assistance: Supervision Ambulation Distance (Feet): 200 Feet Assistive device: Rolling walker (2 wheeled) Gait Pattern/deviations: Step-through pattern     General Gait Details: Cues for upright posture. Better control of RW and safety this session   Stairs            Wheelchair Mobility    Modified Rankin (Stroke Patients Only)       Balance                                    Cognition Arousal/Alertness: Awake/alert Behavior During Therapy: WFL for tasks assessed/performed Overall Cognitive Status: Within Functional Limits for tasks assessed                      Exercises Total Joint Exercises Gluteal Sets: AROM;Both;10 reps;Supine Heel Slides:  AAROM;Left;10 reps Hip ABduction/ADduction: AAROM;Left;10 reps Straight Leg Raises: AAROM;Left;10 reps Long Arc Quad: AROM;Left;10 reps    General Comments        Pertinent Vitals/Pain Pain Assessment: No/denies pain    Home Living                      Prior Function            PT Goals (current goals can now be found in the care plan section) Progress towards PT goals: Progressing toward goals    Frequency  7X/week    PT Plan Current plan remains appropriate    Co-evaluation             End of Session Equipment Utilized During Treatment: Left knee immobilizer Activity Tolerance: Patient tolerated treatment well Patient left: in chair;with call bell/phone within reach     Time: 0841-0905 PT Time Calculation (min): 24 min  Charges:  $Gait Training: 8-22 mins $Therapeutic Exercise: 8-22 mins                    G Codes:      Jacqualyn Posey 08/25/2014, 9:58 AM 08/25/2014 Jacqualyn Posey PTA [phone] pager [phone] office

## 2014-08-25 NOTE — Discharge Summary (Signed)
Patient ID: MELVENA VINK MRN: [id number] DOB/AGE: 1931/04/12 80 y.o.  Admit date: 08/22/2014 Discharge date: 08/25/2014  Admission Diagnoses:  Principal Problem:   Left knee DJD   Discharge Diagnoses:  Same  Past Medical History  Diagnosis Date  . Thyroid disease   . Hypertension   . Hyperlipidemia   . Arthritis   . Cancer     breast, s/p RXT, bilateral  . Hypothyroidism   . Shortness of breath   . Anxiety     Surgeries: Procedure(s): TOTAL KNEE ARTHROPLASTY on 08/22/2014   Consultants:    Discharged Condition: Improved  Hospital Course: DEMIYAH FISCHBACH is an 80 y.o. female who was admitted 08/22/2014 for operative treatment ofLeft knee DJD. Patient has severe unremitting pain that affects sleep, daily activities, and work/hobbies. After pre-op clearance the patient was taken to the operating room on 08/22/2014 and underwent  Procedure(s): TOTAL KNEE ARTHROPLASTY.    Patient was given perioperative antibiotics: Anti-infectives   Start     Dose/Rate Route Frequency Ordered Stop   08/22/14 1630  ceFAZolin (ANCEF) IVPB 2 g/50 mL premix     2 g 100 mL/hr over 30 Minutes Intravenous Every 6 hours 08/22/14 1517 08/23/14 0659   08/22/14 0600  ceFAZolin (ANCEF) IVPB 2 g/50 mL premix     2 g 100 mL/hr over 30 Minutes Intravenous On call to O.R. 08/21/14 1403 08/22/14 1052       Patient was given sequential compression devices, early ambulation, and chemoprophylaxis to prevent DVT.  Patient benefited maximally from hospital stay and there were no complications.    Recent vital signs: Patient Vitals for the past 24 hrs:  BP Temp Temp src Pulse Resp SpO2  08/25/14 0624 148/126 mmHg 98.2 F (36.8 C) Oral 87 - 91 %  08/24/14 2052 123/52 mmHg 98.6 F (37 C) Oral 98 - 95 %  08/24/14 2000 - - - - 17 -  08/24/14 1400 106/64 mmHg 99.5 F (37.5 C) - 91 18 92 %     Recent laboratory studies:  Recent Labs  08/23/14 0540 08/24/14 0550 08/25/14 0400  WBC 12.0* 13.4* 11.9*   HGB 12.3 11.4* 12.0  HCT 35.8* 33.4* 35.4*  PLT 182 164 203  NA 134*  --   --   K 4.1  --   --   CL 95*  --   --   CO2 28  --   --   BUN 11  --   --   CREATININE 0.74  --   --   GLUCOSE 109*  --   --   CALCIUM 8.9  --   --      Discharge Medications:     Medication List         aspirin 325 MG EC tablet  Take 1 tablet (325 mg total) by mouth 2 (two) times daily after a meal.     Calcium Carbonate-Vitamin D 600-200 MG-UNIT Tabs  Take 1 tablet by mouth 2 (two) times daily.     CENTRUM SILVER ADULT 50+ PO  Take by mouth once.     clonazePAM 0.5 MG tablet  Commonly known as:  KLONOPIN  Take 0.5 mg by mouth 3 (three) times daily as needed for anxiety.     CRANBERRY PO  Take 1 tablet by mouth daily.     escitalopram 10 MG tablet  Commonly known as:  LEXAPRO  Take 10 mg by mouth daily.     exemestane 25 MG tablet  Commonly  known as:  AROMASIN  Take 25 mg by mouth daily after breakfast.     folic acid 1 MG tablet  Commonly known as:  FOLVITE  Take 1 tablet (1 mg total) by mouth daily.     HYDROcodone-acetaminophen 5-325 MG per tablet  Commonly known as:  NORCO/VICODIN  Take 1-2 tablets by mouth every 6 (six) hours as needed for moderate pain.     levothyroxine 125 MCG tablet  Commonly known as:  SYNTHROID  Take 1 tablet (125 mcg total) by mouth daily.     LORazepam 1 MG tablet  Commonly known as:  ATIVAN  Take 1 mg by mouth at bedtime.     losartan-hydrochlorothiazide 100-25 MG per tablet  Commonly known as:  HYZAAR  Take 1 tablet by mouth daily.     methocarbamol 500 MG tablet  Commonly known as:  ROBAXIN  Take 1 tablet (500 mg total) by mouth every 6 (six) hours as needed for muscle spasms.     ondansetron 8 MG disintegrating tablet  Commonly known as:  ZOFRAN-ODT  Take 8 mg by mouth every 8 (eight) hours as needed for nausea.     potassium chloride SA 20 MEQ tablet  Commonly known as:  K-DUR,KLOR-CON  Take 1 tablet (20 mEq total) by mouth daily.      rosuvastatin 10 MG tablet  Commonly known as:  CRESTOR  Take 5 mg by mouth daily.     Vitamin B-12 5000 MCG Subl  Take 5,000 mcg by mouth daily.        Diagnostic Studies: Dg Chest 2 View  08/10/2014   CLINICAL DATA:  Preop total knee arthroplasty.  EXAM: CHEST  2 VIEW  COMPARISON:  06/04/2013  FINDINGS: Lungs are clear. Cardiomediastinal silhouette and remainder of the exam is unchanged to include calcified plaque over the thoracoabdominal aorta and degenerate changes of the spine.  IMPRESSION: No active cardiopulmonary disease.   Electronically Signed   By: Marin Olp M.D.   On: 08/10/2014 15:12    Disposition: 01-Home or Self Care      Discharge Instructions   Call MD / Call 911    Complete by:  As directed   If you experience chest pain or shortness of breath, CALL 911 and be transported to the hospital emergency room.  If you develope a fever above 101 F, pus (white drainage) or increased drainage or redness at the wound, or calf pain, call your surgeon's office.     Constipation Prevention    Complete by:  As directed   Drink plenty of fluids.  Prune juice may be helpful.  You may use a stool softener, such as Colace (over the counter) 100 mg twice a day.  Use MiraLax (over the counter) for constipation as needed.     Diet - low sodium heart healthy    Complete by:  As directed      Increase activity slowly as tolerated    Complete by:  As directed            Follow-up Information   Follow up with Hessie Dibble, MD. Call in 2 weeks.   Specialty:  Orthopedic Surgery   Contact information:   [place] [address] [phone]        Signed: Rich Fuchs 08/25/2014, 7:56 AM

## 2014-08-28 ENCOUNTER — Non-Acute Institutional Stay (SKILLED_NURSING_FACILITY): Payer: Medicare Other | Admitting: Adult Health

## 2014-08-28 ENCOUNTER — Encounter: Payer: Self-pay | Admitting: Adult Health

## 2014-08-28 DIAGNOSIS — E785 Hyperlipidemia, unspecified: Secondary | ICD-10-CM

## 2014-08-28 DIAGNOSIS — I10 Essential (primary) hypertension: Secondary | ICD-10-CM

## 2014-08-28 DIAGNOSIS — M1990 Unspecified osteoarthritis, unspecified site: Secondary | ICD-10-CM

## 2014-08-28 DIAGNOSIS — E876 Hypokalemia: Secondary | ICD-10-CM

## 2014-08-28 DIAGNOSIS — C50919 Malignant neoplasm of unspecified site of unspecified female breast: Secondary | ICD-10-CM

## 2014-08-28 DIAGNOSIS — Z96652 Presence of left artificial knee joint: Secondary | ICD-10-CM | POA: Insufficient documentation

## 2014-08-28 NOTE — Progress Notes (Signed)
Patient ID: Hannah Galvan, female   DOB: [birth year], 80 y.o.   MRN: [id number]     [facility]  Allergies  Allergen Reactions  . Ambien [Zolpidem]     confusion  . Pyridium [Phenazopyridine Hcl] Other (See Comments)    hemolysis  . Sulfa Antibiotics Rash    Chief Complaint  Patient presents with  . Hospitalization Follow-up    HPI:  She has been hospitalized for a left knee replacement and here for short term rehab. Her pain is being adequately managed. She is having issues with constipation. Her goal is to return home in the next 1-2 weeks.    Past Medical History  Diagnosis Date  . Thyroid disease   . Hypertension   . Hyperlipidemia   . Arthritis   . Cancer     breast, s/p RXT, bilateral  . Hypothyroidism   . Shortness of breath   . Anxiety     Past Surgical History  Procedure Laterality Date  . Abdominal hysterectomy  2008  . Thyroid surgery  2002  . Breast lumpectomy  2000  . Breast lumpectomy  2007  . Cataract extraction  2011  . Rotator cuff repair      right  . Knee surgery      right  . Bladder tact    . Total knee arthroplasty Left 06/02/2013    Dr Rhona Raider  . Total knee arthroplasty Right 06/02/2013    Procedure: RIGHT TOTAL KNEE ARTHROPLASTY;  Surgeon: Hessie Dibble, MD;  Location: [place];  Service: Orthopedics;  Laterality: Right;  . Total knee arthroplasty Left 08/22/2014    Procedure: TOTAL KNEE ARTHROPLASTY;  Surgeon: Hessie Dibble, MD;  Location: [place];  Service: Orthopedics;  Laterality: Left;    VITAL SIGNS BP 113/67  Pulse 78  Ht 5\' 2"  (1.575 m)  Wt 157 lb (71.215 kg)  BMI 28.71 kg/m2   Patient's Medications  New Prescriptions   No medications on file  Previous Medications   ASPIRIN 325 MG EC TABLET    Take 1 tablet (325 mg total) by mouth 2 (two) times daily after a meal.   ATORVASTATIN (LIPITOR) 20 MG TABLET    Take 20 mg by mouth daily.   CALCIUM CARBONATE-VITAMIN D (CALCIUM + D) 600-200 MG-UNIT TABS    Take 1 tablet by  mouth 2 (two) times daily.     CLONAZEPAM (KLONOPIN) 0.5 MG TABLET    Take 0.5 mg by mouth 3 (three) times daily as needed for anxiety.   CRANBERRY PO    Take 1 tablet by mouth daily. 450 mg daily   CYANOCOBALAMIN (VITAMIN B-12) 5000 MCG SUBL    Take 5,000 mcg by mouth daily.    ESCITALOPRAM (LEXAPRO) 10 MG TABLET    Take 10 mg by mouth daily.    EXEMESTANE (AROMASIN) 25 MG TABLET    Take 25 mg by mouth daily after breakfast.   FOLIC ACID (FOLVITE) 1 MG TABLET    Take 1 tablet (1 mg total) by mouth daily.   HYDROCODONE-ACETAMINOPHEN (NORCO/VICODIN) 5-325 MG PER TABLET    Take 1-2 tablets by mouth every 6 (six) hours as needed for moderate pain.   LEVOTHYROXINE (SYNTHROID) 125 MCG TABLET    Take 1 tablet (125 mcg total) by mouth daily.   LORAZEPAM (ATIVAN) 1 MG TABLET    Take 1 mg by mouth at bedtime.   LOSARTAN-HYDROCHLOROTHIAZIDE (HYZAAR) 100-25 MG PER TABLET    Take 1 tablet by mouth daily.  METHOCARBAMOL (ROBAXIN) 500 MG TABLET    Take 1 tablet (500 mg total) by mouth every 6 (six) hours as needed for muscle spasms.   MULTIPLE VITAMINS-MINERALS (CENTRUM SILVER ADULT 50+ PO)    Take by mouth once.   ONDANSETRON (ZOFRAN-ODT) 8 MG DISINTEGRATING TABLET    Take 8 mg by mouth every 8 (eight) hours as needed for nausea.    POTASSIUM CHLORIDE SA (K-DUR,KLOR-CON) 20 MEQ TABLET    Take 1 tablet (20 mEq total) by mouth daily.  Modified Medications   No medications on file  Discontinued Medications   ROSUVASTATIN (CRESTOR) 10 MG TABLET    Take 5 mg by mouth daily.     SIGNIFICANT DIAGNOSTIC EXAMS  07-25-14: chest x-ray: No active cardiopulmonary disease.    LABS REVIEWED:   08-10-14: wbc 12.6; hgb 15.4; hct 44.2; mcv 93.1; plt 254; glucose 118; bun 14; creat 0.63; k+4.4; na+136 08-23-14: wbc 12.0; hgb 12.3; hct 35.8; mcv 92; plt 182; glucose 109; bun 11; creat 0.74; k+4.1; na++134 08-25-14: wbc 11.9; hgb 12.0; hct 35.4 ;mcv 92.7; plt 203    Review of Systems  Constitutional: Negative for  malaise/fatigue.  Respiratory: Negative for cough and shortness of breath.   Cardiovascular: Positive for leg swelling. Negative for chest pain and palpitations.  Gastrointestinal: Positive for constipation. Negative for heartburn.  Musculoskeletal: Negative for joint pain and myalgias.       Left knee pain is well managed   Skin: Negative.   Neurological: Negative for headaches.  Psychiatric/Behavioral: Negative for depression. The patient is not nervous/anxious.      Physical Exam  Constitutional: She is oriented to person, place, and time. She appears well-developed and well-nourished. No distress.  Neck: Neck supple. No JVD present.  Cardiovascular: Normal rate, regular rhythm and intact distal pulses.   Respiratory: Effort normal and breath sounds normal. No respiratory distress. She has no wheezes.  GI: Soft. Bowel sounds are normal. She exhibits no distension. There is no tenderness.  Musculoskeletal: She exhibits edema.  Is able to move all extremities; is status post left knee replacement has 2+edema on right lower extremity Has 3+ edema left lower extremity  Neurological: She is alert and oriented to person, place, and time.  Skin: Skin is warm and dry. She is not diaphoretic.  Incision line without signs of infection present        ASSESSMENT/ PLAN:  1. DJD left knee: is status post left knee replacement; will follow up with orthopedic as indicated; will continue therapy as directed will continue robaxin 500 mg every 6 hours as needed and vicodin 5/325 mg 1 or 2 tabs every 6 hours as needed asa 325 mg twice daily and will monitor. Will begin ted hose daily and will monitor   2. Anxiety with depression: will continue lexapro 10 mg daily; klonopin 0.5 mg three times daily as needed; and ativan 1 mg nightly and will monitor   3. Hypothyroidism: will continue synthroid 125 mcg daily   4. Breast cancer: she is presently stable will continue her on aromasin 25 mg daily and  will monitor her status   5. Dyslipidemia: will continue lipitor 20 mg daily   6. Hypertension: will continue hyzaar 100/25 mg daily   7. Hypokalemia: will continue k+ 20 meq daily   8. Constipation: will begin colace twice daily    Time spent with patient 50 minutes   Ok Edwards NP [facility]  Contact [phone] Monday through Friday 8am- 5pm  After hours  call [phone]

## 2014-08-31 ENCOUNTER — Non-Acute Institutional Stay (SKILLED_NURSING_FACILITY): Payer: Medicare Other | Admitting: Internal Medicine

## 2014-08-31 DIAGNOSIS — K5900 Constipation, unspecified: Secondary | ICD-10-CM

## 2014-08-31 DIAGNOSIS — F329 Major depressive disorder, single episode, unspecified: Secondary | ICD-10-CM

## 2014-08-31 DIAGNOSIS — E039 Hypothyroidism, unspecified: Secondary | ICD-10-CM

## 2014-08-31 DIAGNOSIS — E785 Hyperlipidemia, unspecified: Secondary | ICD-10-CM

## 2014-08-31 DIAGNOSIS — I10 Essential (primary) hypertension: Secondary | ICD-10-CM

## 2014-08-31 DIAGNOSIS — C50919 Malignant neoplasm of unspecified site of unspecified female breast: Secondary | ICD-10-CM

## 2014-08-31 DIAGNOSIS — F418 Other specified anxiety disorders: Secondary | ICD-10-CM

## 2014-08-31 DIAGNOSIS — M1712 Unilateral primary osteoarthritis, left knee: Secondary | ICD-10-CM

## 2014-08-31 DIAGNOSIS — F419 Anxiety disorder, unspecified: Secondary | ICD-10-CM

## 2014-09-03 ENCOUNTER — Encounter: Payer: Self-pay | Admitting: Internal Medicine

## 2014-09-03 DIAGNOSIS — K5900 Constipation, unspecified: Secondary | ICD-10-CM | POA: Insufficient documentation

## 2014-09-03 DIAGNOSIS — F32A Depression, unspecified: Secondary | ICD-10-CM | POA: Insufficient documentation

## 2014-09-03 DIAGNOSIS — F329 Major depressive disorder, single episode, unspecified: Secondary | ICD-10-CM | POA: Insufficient documentation

## 2014-09-03 DIAGNOSIS — F419 Anxiety disorder, unspecified: Secondary | ICD-10-CM

## 2014-09-03 NOTE — Progress Notes (Signed)
Patient ID: Hannah Galvan, female   DOB: 06-03-1931, 80 y.o.   MRN: [id number]     Facility: [facility]    PCP: Sheela Stack, MD  Code Status: full code  Allergies  Allergen Reactions  . Ambien [Zolpidem]     confusion  . Pyridium [Phenazopyridine Hcl] Other (See Comments)    hemolysis  . Sulfa Antibiotics Rash    Chief Complaint: new admission  HPI:  80 y/o female patient is here for STR after hospital admission from 08/22/14-08/25/14 with left knee OA and undergoing left total knee arthroplasty. She is seen in her room today. She denies any concerns. Her pain is under control and she is working with therapy team  Review of Systems:  Constitutional: Negative for fever, chills, malaise/fatigue and diaphoresis.  HENT: Negative for congestion Respiratory: Negative for cough, sputum production, shortness of breath and wheezing.   Cardiovascular: Negative for chest pain, palpitations, orthopnea. Positive for leg swelling.  Gastrointestinal: Negative for heartburn, nausea, vomiting, abdominal pain, diarrhea and constipation.  Genitourinary: Negative for dysuria  Musculoskeletal: Negative for back pain, falls, joint pain and myalgias.  Skin: Negative for itching and rash.  Neurological: Negative for weakness,dizziness, tingling, focal weakness and headaches.  Psychiatric/Behavioral: Negative for depression      Past Medical History  Diagnosis Date  . Thyroid disease   . Hypertension   . Hyperlipidemia   . Arthritis   . Cancer     breast, s/p RXT, bilateral  . Hypothyroidism   . Shortness of breath   . Anxiety    Past Surgical History  Procedure Laterality Date  . Abdominal hysterectomy  2008  . Thyroid surgery  2002  . Breast lumpectomy  2000  . Breast lumpectomy  2007  . Cataract extraction  2011  . Rotator cuff repair      right  . Knee surgery      right  . Bladder tact    . Total knee arthroplasty Left 06/02/2013    Dr  Rhona Raider  . Total knee arthroplasty Right 06/02/2013    Procedure: RIGHT TOTAL KNEE ARTHROPLASTY;  Surgeon: Hessie Dibble, MD;  Location: [place];  Service: Orthopedics;  Laterality: Right;  . Total knee arthroplasty Left 08/22/2014    Procedure: TOTAL KNEE ARTHROPLASTY;  Surgeon: Hessie Dibble, MD;  Location: [place];  Service: Orthopedics;  Laterality: Left;   Social History:   reports that she has never smoked. She has never used smokeless tobacco. She reports that she does not drink alcohol or use illicit drugs.  Family History  Problem Relation Age of Onset  . Hypertension Mother   . Stroke Mother   . Cancer Father   . Cancer Sister     leukemia  . Cancer Brother     leukemia  . Cancer Sister     vaginal  . Cancer Sister     bladder  . Cancer Brother     prostate, skin  . Cancer Brother     colon  . Cancer Brother     prostate, bone mets  . Cancer Brother     skin  . Heart disease Sister     Medications: Patient's Medications  New Prescriptions   No medications on file  Previous Medications   ASPIRIN 325 MG EC TABLET    Take 1 tablet (325 mg total) by mouth 2 (two) times daily after a meal.   ATORVASTATIN (LIPITOR) 20 MG TABLET    Take 20  mg by mouth daily.   CALCIUM CARBONATE-VITAMIN D (CALCIUM + D) 600-200 MG-UNIT TABS    Take 1 tablet by mouth 2 (two) times daily.     CLONAZEPAM (KLONOPIN) 0.5 MG TABLET    Take 0.5 mg by mouth 3 (three) times daily as needed for anxiety.   CRANBERRY PO    Take 1 tablet by mouth daily. 450 mg daily   CYANOCOBALAMIN (VITAMIN B-12) 5000 MCG SUBL    Take 5,000 mcg by mouth daily.    ESCITALOPRAM (LEXAPRO) 10 MG TABLET    Take 10 mg by mouth daily.    EXEMESTANE (AROMASIN) 25 MG TABLET    Take 25 mg by mouth daily after breakfast.   FOLIC ACID (FOLVITE) 1 MG TABLET    Take 1 tablet (1 mg total) by mouth daily.   HYDROCODONE-ACETAMINOPHEN (NORCO/VICODIN) 5-325 MG PER TABLET    Take 1-2 tablets by mouth every 6 (six) hours as needed  for moderate pain.   LEVOTHYROXINE (SYNTHROID) 125 MCG TABLET    Take 1 tablet (125 mcg total) by mouth daily.   LORAZEPAM (ATIVAN) 1 MG TABLET    Take 1 mg by mouth at bedtime.   LOSARTAN-HYDROCHLOROTHIAZIDE (HYZAAR) 100-25 MG PER TABLET    Take 1 tablet by mouth daily.    METHOCARBAMOL (ROBAXIN) 500 MG TABLET    Take 1 tablet (500 mg total) by mouth every 6 (six) hours as needed for muscle spasms.   MULTIPLE VITAMINS-MINERALS (CENTRUM SILVER ADULT 50+ PO)    Take by mouth once.   ONDANSETRON (ZOFRAN-ODT) 8 MG DISINTEGRATING TABLET    Take 8 mg by mouth every 8 (eight) hours as needed for nausea.    POTASSIUM CHLORIDE SA (K-DUR,KLOR-CON) 20 MEQ TABLET    Take 1 tablet (20 mEq total) by mouth daily.  Modified Medications   No medications on file  Discontinued Medications   No medications on file     Physical Exam:  Filed Vitals:   08/31/14 1432  BP: 137/71  Pulse: 83  Temp: 97.5 F (36.4 C)  Resp: 18  SpO2: 98%    General- elderly female in no acute distress Head- atraumatic, normocephalic Eyes- no pallor, no icterus, no discharge Neck- no lymphadenopathy Cardiovascular- normal s1,s2, no murmurs Respiratory- bilateral clear to auscultation, no wheeze, no rhonchi, no crackles, no use of accessory muscles Abdomen- bowel sounds present, soft, non tender Musculoskeletal- able to move all 4 extremities, using a walker, leg edema present, ted hose present Neurological- no focal deficit Skin- warm and dry, dressing in place at incision site Psychiatry- alert and oriented to person, place and time, normal mood and affect    Labs reviewed: Basic Metabolic Panel:  Recent Labs  02/20/14 1518 08/10/14 1222 08/23/14 0540  NA 133* 136* 134*  K 3.6 4.4 4.1  CL 97 95* 95*  CO2 29 24 28   GLUCOSE 115* 118* 109*  BUN 10 14 11   CREATININE 0.82 0.63 0.74  CALCIUM 9.2 9.8 8.9   Liver Function Tests:  Recent Labs  02/20/14 1518  AST 25  ALT 20  ALKPHOS 54  BILITOT 0.5    PROT 6.1  ALBUMIN 4.1   No results found for this basename: LIPASE, AMYLASE,  in the last 8760 hours No results found for this basename: AMMONIA,  in the last 8760 hours CBC:  Recent Labs  02/20/14 1518  08/10/14 1222 08/23/14 0540 08/24/14 0550 08/25/14 0400  WBC 7.5  < > 12.6* 12.0* 13.4* 11.9*  NEUTROABS 4.1  --  10.6*  --   --   --   HGB 14.2  --  15.4* 12.3 11.4* 12.0  HCT 41.2  --  44.2 35.8* 33.4* 35.4*  MCV 94  < > 93.1 92.0 94.4 92.7  PLT 238  < > 254 182 164 203  < > = values in this interval not displayed.  Radiological Exams: Dg Chest 2 View  08/10/2014   CLINICAL DATA:  Preop total knee arthroplasty.  EXAM: CHEST  2 VIEW  COMPARISON:  06/04/2013  FINDINGS: Lungs are clear. Cardiomediastinal silhouette and remainder of the exam is unchanged to include calcified plaque over the thoracoabdominal aorta and degenerate changes of the spine.  IMPRESSION: No active cardiopulmonary disease.   Electronically Signed   By: Marin Olp M.D.   On: 08/10/2014 15:12    Assessment/Plan  Left knee OA status post left knee replacement. Will have patient work with PT/OT as tolerated to regain strength and restore function.  Fall precautions are in place. Has follow up with orthopedic. continue current regimen vicodin for pain and robaxin 500 mg every 6 hours as needed for muscle spasm. continue aspirin 325 mg bid for dvt prophylaxis. Continue ca-vit d   Constipation Continue colace twice daily   HTN Stable, continue hyzaar current regimen with kcl supplement  Anxiety with depression continue lexapro 10 mg daily, ativan daily and klonopin 0.5 mg three times daily as needed  Hypothyroidism continue synthroid 125 mcg daily   Breast cancer continue haromasin 25 mg daily   Dyslipidemia continue lipitor 20 mg daily    Family/ staff Communication: reviewed care plan with patient and nursing supervisor   Goals of care: short term rehabilitation    Labs/tests ordered:  cbc, cmp    Blanchie Serve, MD  [facility] [phone] (Monday-Friday 8 am - 5 pm) [phone] (afterhours)

## 2014-09-07 ENCOUNTER — Non-Acute Institutional Stay (SKILLED_NURSING_FACILITY): Payer: Medicare Other | Admitting: Adult Health

## 2014-09-07 DIAGNOSIS — M1712 Unilateral primary osteoarthritis, left knee: Secondary | ICD-10-CM

## 2014-09-07 DIAGNOSIS — I10 Essential (primary) hypertension: Secondary | ICD-10-CM

## 2014-09-07 DIAGNOSIS — Z96652 Presence of left artificial knee joint: Secondary | ICD-10-CM

## 2014-09-07 DIAGNOSIS — R600 Localized edema: Secondary | ICD-10-CM

## 2014-09-10 DIAGNOSIS — M6281 Muscle weakness (generalized): Secondary | ICD-10-CM

## 2014-09-10 DIAGNOSIS — Z471 Aftercare following joint replacement surgery: Secondary | ICD-10-CM

## 2014-09-10 DIAGNOSIS — C50911 Malignant neoplasm of unspecified site of right female breast: Secondary | ICD-10-CM

## 2014-09-10 DIAGNOSIS — F419 Anxiety disorder, unspecified: Secondary | ICD-10-CM

## 2014-09-10 DIAGNOSIS — M1990 Unspecified osteoarthritis, unspecified site: Secondary | ICD-10-CM

## 2014-09-10 DIAGNOSIS — I10 Essential (primary) hypertension: Secondary | ICD-10-CM

## 2014-09-18 NOTE — Progress Notes (Signed)
Patient ID: Hannah Galvan, female   DOB: 1930-12-31, 80 y.o.   MRN: [id number]     [facility]  Allergies  Allergen Reactions  . Ambien [Zolpidem]     confusion  . Pyridium [Phenazopyridine Hcl] Other (See Comments)    hemolysis  . Sulfa Antibiotics Rash     Chief Complaint  Patient presents with  . Discharge Note    HPI:  She is being discharged to home with home health for pt/ot/aid. She will not need dme. She will need prescriptions and a follow up with her pcp. She had been hospitalized for a left knee replacement.     Past Medical History  Diagnosis Date  . Thyroid disease   . Hypertension   . Hyperlipidemia   . Arthritis   . Cancer     breast, s/p RXT, bilateral  . Hypothyroidism   . Shortness of breath   . Anxiety     Past Surgical History  Procedure Laterality Date  . Abdominal hysterectomy  2008  . Thyroid surgery  2002  . Breast lumpectomy  2000  . Breast lumpectomy  2007  . Cataract extraction  2011  . Rotator cuff repair      right  . Knee surgery      right  . Bladder tact    . Total knee arthroplasty Left 06/02/2013    Dr Rhona Raider  . Total knee arthroplasty Right 06/02/2013    Procedure: RIGHT TOTAL KNEE ARTHROPLASTY;  Surgeon: Hessie Dibble, MD;  Location: [place];  Service: Orthopedics;  Laterality: Right;  . Total knee arthroplasty Left 08/22/2014    Procedure: TOTAL KNEE ARTHROPLASTY;  Surgeon: Hessie Dibble, MD;  Location: [place];  Service: Orthopedics;  Laterality: Left;    VITAL SIGNS BP 124/68  Pulse 72  Ht 5\' 2"  (1.575 m)  Wt 157 lb (71.215 kg)  BMI 28.71 kg/m2   Patient's Medications  New Prescriptions   No medications on file  Previous Medications   ASPIRIN 325 MG EC TABLET    Take 1 tablet (325 mg total) by mouth 2 (two) times daily after a meal.   ATORVASTATIN (LIPITOR) 20 MG TABLET    Take 20 mg by mouth daily.   CALCIUM CARBONATE-VITAMIN D (CALCIUM + D) 600-200 MG-UNIT TABS    Take 1 tablet by mouth 2 (two) times  daily.     CLONAZEPAM (KLONOPIN) 0.5 MG TABLET    Take 0.5 mg by mouth 3 (three) times daily as needed for anxiety.   CRANBERRY PO    Take 1 tablet by mouth daily. 450 mg daily   CYANOCOBALAMIN (VITAMIN B-12) 5000 MCG SUBL    Take 5,000 mcg by mouth daily.    ESCITALOPRAM (LEXAPRO) 10 MG TABLET    Take 10 mg by mouth daily.    EXEMESTANE (AROMASIN) 25 MG TABLET    Take 25 mg by mouth daily after breakfast.   FOLIC ACID (FOLVITE) 1 MG TABLET    Take 1 tablet (1 mg total) by mouth daily.   HYDROCODONE-ACETAMINOPHEN (NORCO/VICODIN) 5-325 MG PER TABLET    Take 1-2 tablets by mouth every 6 (six) hours as needed for moderate pain.   LEVOTHYROXINE (SYNTHROID) 125 MCG TABLET    Take 1 tablet (125 mcg total) by mouth daily.   LORAZEPAM (ATIVAN) 1 MG TABLET    Take 1 mg by mouth at bedtime.   LOSARTAN-HYDROCHLOROTHIAZIDE (HYZAAR) 100-25 MG PER TABLET    Take 1 tablet by mouth daily.  METHOCARBAMOL (ROBAXIN) 500 MG TABLET    Take 1 tablet (500 mg total) by mouth every 6 (six) hours as needed for muscle spasms.   MULTIPLE VITAMINS-MINERALS (CENTRUM SILVER ADULT 50+ PO)    Take by mouth once.   ONDANSETRON (ZOFRAN-ODT) 8 MG DISINTEGRATING TABLET    Take 8 mg by mouth every 8 (eight) hours as needed for nausea.    POTASSIUM CHLORIDE SA (K-DUR,KLOR-CON) 20 MEQ TABLET    Take 1 tablet (20 mEq total) by mouth daily.  Modified Medications   No medications on file  Discontinued Medications   No medications on file    SIGNIFICANT DIAGNOSTIC EXAMS   07-25-14: chest x-ray: No active cardiopulmonary disease.    LABS REVIEWED:   08-10-14: wbc 12.6; hgb 15.4; hct 44.2; mcv 93.1; plt 254; glucose 118; bun 14; creat 0.63; k+4.4; na+136 08-23-14: wbc 12.0; hgb 12.3; hct 35.8; mcv 92; plt 182; glucose 109; bun 11; creat 0.74; k+4.1; na++134 08-25-14: wbc 11.9; hgb 12.0; hct 35.4 ;mcv 92.7; plt 203    Review of Systems  Constitutional: Negative for malaise/fatigue.  Respiratory: Negative for cough and shortness  of breath.   Cardiovascular: Positive for leg swelling. Negative for chest pain and palpitations.  Gastrointestinal: Positive for constipation. Negative for heartburn.  Musculoskeletal: Negative for joint pain and myalgias.       Left knee pain is well managed   Skin: Negative.   Neurological: Negative for headaches.  Psychiatric/Behavioral: Negative for depression. The patient is not nervous/anxious.      Physical Exam  Constitutional: She is oriented to person, place, and time. She appears well-developed and well-nourished. No distress.  Neck: Neck supple. No JVD present.  Cardiovascular: Normal rate, regular rhythm and intact distal pulses.   Respiratory: Effort normal and breath sounds normal. No respiratory distress. She has no wheezes.  GI: Soft. Bowel sounds are normal. She exhibits no distension. There is no tenderness.  Musculoskeletal: She exhibits edema.  Is able to move all extremities; is status post left knee replacement has 2+edema on right lower extremity Has 3+ edema left lower extremity  Neurological: She is alert and oriented to person, place, and time.  Skin: Skin is warm and dry. She is not diaphoretic.  Incision line without signs of infection present        ASSESSMENT/ PLAN:  Will discharge her to home with home health for pt/ot/aid: to improve upon gait; mobility; independence with adls and ald care. She will not need dme. Her prescriptions have been written for a 30 day supply of her medications with #30 klonopin 0.5 mg tabs and #30 ativan 1 mg tabs. She has a follow up with her pcp Sharilyn Sites on 09-19-14 at 11am.   Time spent with patient 40 minutes.   Ok Edwards NP [facility]  Contact [phone] Monday through Friday 8am- 5pm  After hours call [phone]

## 2014-09-25 ENCOUNTER — Encounter: Payer: Self-pay | Admitting: Internal Medicine

## 2014-10-02 ENCOUNTER — Other Ambulatory Visit: Payer: Self-pay | Admitting: Adult Health

## 2014-10-04 MED ORDER — METHYLPREDNISOLONE SODIUM SUCC 40 MG IJ SOLR
INTRAMUSCULAR | Status: AC
  Filled 2014-10-04: qty 1

## 2014-11-02 ENCOUNTER — Other Ambulatory Visit: Payer: Self-pay | Admitting: Dermatology

## 2014-11-02 ENCOUNTER — Telehealth: Payer: Self-pay | Admitting: Hematology & Oncology

## 2014-11-02 NOTE — Telephone Encounter (Signed)
Pt moved 12-29 to 12-28

## 2014-11-08 ENCOUNTER — Other Ambulatory Visit: Payer: Self-pay

## 2014-11-08 DIAGNOSIS — Z1231 Encounter for screening mammogram for malignant neoplasm of breast: Secondary | ICD-10-CM

## 2014-11-14 ENCOUNTER — Other Ambulatory Visit: Payer: Self-pay | Admitting: Adult Health

## 2014-11-16 ENCOUNTER — Ambulatory Visit: Payer: Medicare Other

## 2014-11-20 ENCOUNTER — Ambulatory Visit: Payer: Medicare Other | Admitting: Hematology & Oncology

## 2014-11-20 ENCOUNTER — Telehealth: Payer: Self-pay | Admitting: Hematology & Oncology

## 2014-11-20 ENCOUNTER — Other Ambulatory Visit: Payer: Medicare Other | Admitting: Lab

## 2014-11-20 NOTE — Telephone Encounter (Signed)
Pt called back left message cx 12-30 said couldn't come early. I called back home number no answer not sure about voice mail, I left message but again not sure if it was really voice mail. I called cell no answer and voice mail full.

## 2014-11-21 ENCOUNTER — Other Ambulatory Visit: Payer: Medicare Other | Admitting: Lab

## 2014-11-21 ENCOUNTER — Ambulatory Visit: Payer: Medicare Other | Admitting: Hematology & Oncology

## 2014-11-22 ENCOUNTER — Ambulatory Visit: Payer: Medicare Other | Admitting: Hematology & Oncology

## 2014-11-22 ENCOUNTER — Other Ambulatory Visit: Payer: Medicare Other | Admitting: Lab

## 2014-11-23 ENCOUNTER — Ambulatory Visit
Admission: RE | Admit: 2014-11-23 | Discharge: 2014-11-23 | Disposition: A | Discharge status: Home / Self Care | Payer: Medicare Other | Source: Ambulatory Visit

## 2014-11-23 DIAGNOSIS — Z1231 Encounter for screening mammogram for malignant neoplasm of breast: Secondary | ICD-10-CM

## 2014-12-06 ENCOUNTER — Ambulatory Visit (INDEPENDENT_AMBULATORY_CARE_PROVIDER_SITE_OTHER): Payer: Medicare Other | Admitting: Pulmonary Disease

## 2014-12-06 ENCOUNTER — Other Ambulatory Visit: Payer: Medicare Other

## 2014-12-06 ENCOUNTER — Encounter (INDEPENDENT_AMBULATORY_CARE_PROVIDER_SITE_OTHER): Payer: Self-pay

## 2014-12-06 ENCOUNTER — Encounter: Payer: Self-pay | Admitting: Pulmonary Disease

## 2014-12-06 VITALS — BP 128/78 | HR 87 | Ht 63.0 in | Wt 151.2 lb

## 2014-12-06 DIAGNOSIS — R42 Dizziness and giddiness: Secondary | ICD-10-CM

## 2014-12-06 DIAGNOSIS — R911 Solitary pulmonary nodule: Secondary | ICD-10-CM

## 2014-12-06 DIAGNOSIS — R0600 Dyspnea, unspecified: Secondary | ICD-10-CM | POA: Diagnosis not present

## 2014-12-06 DIAGNOSIS — F418 Other specified anxiety disorders: Secondary | ICD-10-CM

## 2014-12-06 DIAGNOSIS — F329 Major depressive disorder, single episode, unspecified: Secondary | ICD-10-CM

## 2014-12-06 DIAGNOSIS — F419 Anxiety disorder, unspecified: Secondary | ICD-10-CM

## 2014-12-06 NOTE — Progress Notes (Signed)
Subjective:    Patient ID: Hannah Galvan, female    DOB: 1931/10/02, 81 y.o.   MRN: [id number]  HPI   PCP- [place]  81 year old never smoker presents for evaluation of episodic dyspnea. Her husband died 2 years ago after 52 years of marriage and her son was killed in August 2015- she is obviously traumatized and has been treated for anxiety with benzodiazepines. She reports for the last 4 months episodes of dyspnea right after waking up with a feeling of chest heaviness, feeling nervous and jittery-this lasts for about 2 hours and then spontaneously resolves. During one such episode, she was taken to the local fire department an EKG was reportedly normal. She was prescribed clonazepam but stopped working, she is now on lorazepam. Her son [name] and niece accompany her and feel that this is related to anxiety. There is no prior history of asthma or wheezing. There is no personal or family history of venous thromboembolism. She does not have significant dyspnea on exertion. She sees Dr. Marin Olp for breast cancer surveillance, is maintained on Aromasin. A 9 mm right lower lobe nodule was noted in 02/2010, slight increased to 1.1 cm in 12/2011. Chest x-ray in 07/2014 was normal She did not desaturate on walking 3 laps around the office today and heart rate improved from 78-101.  Lab work including TSH was normal.  Past Medical History  Diagnosis Date  . Thyroid disease   . Hypertension   . Hyperlipidemia   . Arthritis   . Cancer     breast, s/p RXT, bilateral  . Hypothyroidism   . Shortness of breath   . Anxiety     Past Surgical History  Procedure Laterality Date  . Abdominal hysterectomy  2008  . Thyroid surgery  2002  . Breast lumpectomy  2000  . Breast lumpectomy  2007  . Cataract extraction  2011  . Rotator cuff repair      right  . Knee surgery      right  . Bladder tact    . Total knee arthroplasty Left 06/02/2013    Dr Rhona Raider  . Total knee arthroplasty Right 06/02/2013   Procedure: RIGHT TOTAL KNEE ARTHROPLASTY;  Surgeon: Hessie Dibble, MD;  Location: [place];  Service: Orthopedics;  Laterality: Right;  . Total knee arthroplasty Left 08/22/2014    Procedure: TOTAL KNEE ARTHROPLASTY;  Surgeon: Hessie Dibble, MD;  Location: [place];  Service: Orthopedics;  Laterality: Left;    Allergies  Allergen Reactions  . Ambien [Zolpidem]     confusion  . Pyridium [Phenazopyridine Hcl] Other (See Comments)    hemolysis  . Sulfa Antibiotics Rash    History   Social History  . Marital Status: Widowed    Spouse Name: N/A    Number of Children: N/A  . Years of Education: N/A   Occupational History  . Not on file.   Social History Main Topics  . Smoking status: Never Smoker   . Smokeless tobacco: Never Used     Comment: never used tobacco  . Alcohol Use: No  . Drug Use: No  . Sexual Activity: Not Currently   Other Topics Concern  . Not on file   Social History Narrative    Family History  Problem Relation Age of Onset  . Hypertension Mother   . Stroke Mother   . Cancer Father   . Cancer Sister     leukemia  . Cancer Brother     leukemia  .  Cancer Sister     vaginal  . Cancer Sister     bladder  . Cancer Brother     prostate, skin  . Cancer Brother     colon  . Cancer Brother     prostate, bone mets  . Cancer Brother     skin  . Heart disease Sister      Review of Systems  Constitutional: Negative for fever and unexpected weight change.  HENT: Negative for congestion, dental problem, ear pain, nosebleeds, postnasal drip, rhinorrhea, sinus pressure, sneezing, sore throat and trouble swallowing.   Eyes: Negative for redness and itching.  Respiratory: Positive for shortness of breath. Negative for cough, chest tightness and wheezing.   Cardiovascular: Negative for palpitations and leg swelling.  Gastrointestinal: Negative for nausea and vomiting.  Genitourinary: Negative for dysuria.  Musculoskeletal: Negative for joint swelling.    Skin: Negative for rash.  Neurological: Negative for headaches.  Hematological: Does not bruise/bleed easily.  Psychiatric/Behavioral: Negative for dysphoric mood. The patient is not nervous/anxious.        Objective:   Physical Exam  Gen. Pleasant, well-nourished, in no distress, normal affect ENT - no lesions, no post nasal drip Neck: No JVD, no thyromegaly, no carotid bruits Lungs: no use of accessory muscles, no dullness to percussion, clear without rales or rhonchi  Cardiovascular: Rhythm regular, heart sounds  normal, no murmurs or gallops, no peripheral edema Abdomen: soft and non-tender, no hepatosplenomegaly, BS normal. Musculoskeletal: No deformities, no cyanosis or clubbing Neuro:  alert, non focal       Assessment & Plan:

## 2014-12-06 NOTE — Assessment & Plan Note (Signed)
It is very likely that her dyspnea is related to anxiety rather than to an organic cause- but since her symptoms are somewhat nonspecific, it is reasonable to rule out arrhythmia as a cause. Given her recent knee replacements, venous thromboembolism is a possibility-however the episodic nature of her symptoms makes this a low probability.  Holter monitor for heart rhythm Blood test for blood clots If above testing ok, then your breathing issues may be related to anxiety & would suggest counselling

## 2014-12-06 NOTE — Patient Instructions (Signed)
Ambulatory satn Holter monitor for heart rhythm Blood test for blood clots If above testing ok, then your breathing issues may be related to anxiety & would suggest counselling

## 2014-12-06 NOTE — Assessment & Plan Note (Signed)
If testing is negative, then would suggest adding low-dose benzo such as Xanax for episodic symptoms when necessary

## 2014-12-06 NOTE — Assessment & Plan Note (Signed)
Suggest follow-up CT imaging for nodule

## 2014-12-07 LAB — D-DIMER, QUANTITATIVE (NOT AT ARMC): D-Dimer, Quant: 1.82 ug/mL-FEU — ABNORMAL HIGH (ref 0.00–0.48)

## 2014-12-08 ENCOUNTER — Encounter (INDEPENDENT_AMBULATORY_CARE_PROVIDER_SITE_OTHER): Payer: Medicare Other

## 2014-12-08 ENCOUNTER — Encounter: Payer: Self-pay | Admitting: Radiology

## 2014-12-08 ENCOUNTER — Other Ambulatory Visit: Payer: Self-pay | Admitting: Pulmonary Disease

## 2014-12-08 DIAGNOSIS — R0600 Dyspnea, unspecified: Secondary | ICD-10-CM

## 2014-12-08 DIAGNOSIS — R42 Dizziness and giddiness: Secondary | ICD-10-CM

## 2014-12-08 NOTE — Progress Notes (Signed)
Patient ID: Hannah Galvan, female   DOB: 11/14/1931, 81 y.o.   MRN: [id number] [facility] 48hr holter applied

## 2014-12-08 NOTE — Addendum Note (Signed)
Addended by: Virl Cagey on: 12/08/2014 11:22 AM   Modules accepted: Orders

## 2014-12-14 ENCOUNTER — Telehealth: Payer: Self-pay | Admitting: Pulmonary Disease

## 2014-12-14 NOTE — Telephone Encounter (Signed)
Pl ask cardiology for results &have the route to me

## 2014-12-14 NOTE — Telephone Encounter (Signed)
Under ECG tab it reports holter monitor still in process. Pt is aware. I advised will call her as soon as RA gets results. Will forward to [place] to f/u on

## 2014-12-14 NOTE — Telephone Encounter (Signed)
Pt turned Holter Monitor on 12/11/14. Requesting results. Please advise Dr Elsworth Soho if you have received these results.

## 2014-12-18 ENCOUNTER — Ambulatory Visit (HOSPITAL_BASED_OUTPATIENT_CLINIC_OR_DEPARTMENT_OTHER): Payer: Medicare Other | Admitting: Hematology & Oncology

## 2014-12-18 ENCOUNTER — Other Ambulatory Visit (HOSPITAL_BASED_OUTPATIENT_CLINIC_OR_DEPARTMENT_OTHER): Payer: Medicare Other | Admitting: Lab

## 2014-12-18 ENCOUNTER — Encounter: Payer: Self-pay | Admitting: Hematology & Oncology

## 2014-12-18 VITALS — BP 118/59 | HR 72 | Temp 98.0°F | Resp 14 | Ht 63.0 in | Wt 150.0 lb

## 2014-12-18 DIAGNOSIS — C50919 Malignant neoplasm of unspecified site of unspecified female breast: Secondary | ICD-10-CM

## 2014-12-18 DIAGNOSIS — Z853 Personal history of malignant neoplasm of breast: Secondary | ICD-10-CM

## 2014-12-18 LAB — COMPREHENSIVE METABOLIC PANEL
ALK PHOS: 57 U/L (ref 39–117)
ALT: 25 U/L (ref 0–35)
AST: 31 U/L (ref 0–37)
Albumin: 4.3 g/dL (ref 3.5–5.2)
BILIRUBIN TOTAL: 0.4 mg/dL (ref 0.2–1.2)
BUN: 20 mg/dL (ref 6–23)
CALCIUM: 9.8 mg/dL (ref 8.4–10.5)
CO2: 28 mEq/L (ref 19–32)
Chloride: 99 mEq/L (ref 96–112)
Creatinine, Ser: 0.86 mg/dL (ref 0.50–1.10)
Glucose, Bld: 93 mg/dL (ref 70–99)
Potassium: 4.3 mEq/L (ref 3.5–5.3)
SODIUM: 137 meq/L (ref 135–145)
Total Protein: 6.9 g/dL (ref 6.0–8.3)

## 2014-12-18 LAB — CBC WITH DIFFERENTIAL (CANCER CENTER ONLY)
BASO#: 0 10*3/uL (ref 0.0–0.2)
BASO%: 0.6 % (ref 0.0–2.0)
EOS%: 1.8 % (ref 0.0–7.0)
Eosinophils Absolute: 0.1 10*3/uL (ref 0.0–0.5)
HEMATOCRIT: 43.4 % (ref 34.8–46.6)
HGB: 14.3 g/dL (ref 11.6–15.9)
LYMPH#: 2.6 10*3/uL (ref 0.9–3.3)
LYMPH%: 35.5 % (ref 14.0–48.0)
MCH: 30.4 pg (ref 26.0–34.0)
MCHC: 32.9 g/dL (ref 32.0–36.0)
MCV: 92 fL (ref 81–101)
MONO#: 0.8 10*3/uL (ref 0.1–0.9)
MONO%: 11.3 % (ref 0.0–13.0)
NEUT#: 3.7 10*3/uL (ref 1.5–6.5)
NEUT%: 50.8 % (ref 39.6–80.0)
Platelets: 194 10*3/uL (ref 145–400)
RBC: 4.71 10*6/uL (ref 3.70–5.32)
RDW: 13.8 % (ref 11.1–15.7)
WBC: 7.3 10*3/uL (ref 3.9–10.0)

## 2014-12-18 NOTE — Progress Notes (Signed)
Hematology and Oncology Follow Up Visit  Hannah Galvan [id number] Dec 13, 1930 81 y.o. 12/18/2014   Principle Diagnosis:  1. Ductal carcinoma in situ of the left breast. 2. History of stage IIA (T1 N1 M0) ductal carcinoma of the right     breast. 3. History of drug-induced hemolytic anemia  Current Therapy:   Aromasin 25 mg p.o. daily for prophylaxis.     Interim History:  Ms.  Galvan is back for followup. She is having a tough time. Her son died in a tragic accident last year. This is been very hard on her.  Her husband died 2 years ago. He had congestive heart failure.  She had knee surgery for the left knee back in September. This went well. She was doing physical therapy.  She had a skin cancer taken off the right temple recently. She still has a bandage on this area. She has back next week to have this taken care of with suture removal. She thinks is a squamous cell carcinoma.  Her last mammogram was back in December 2015. Everything looked okay.  Otherwise, she is doing okay. She's had no change in medicines. She did have some heart issues. She was on a Holter monitor. She does not of the results.  She did see a pulmonologist for some shortness of breath.    Medications:  Current outpatient prescriptions:  .  aspirin EC 81 MG tablet, Take 81 mg by mouth daily., Disp: , Rfl:  .  atorvastatin (LIPITOR) 20 MG tablet, Take 10 mg by mouth daily. , Disp: , Rfl:  .  Calcium Carbonate-Vitamin D (CALCIUM + D) 600-200 MG-UNIT TABS, Take 1 tablet by mouth 2 (two) times daily.  , Disp: , Rfl:  .  CRANBERRY PO, Take 1 tablet by mouth daily. 450 mg daily, Disp: , Rfl:  .  escitalopram (LEXAPRO) 10 MG tablet, Take 10 mg by mouth daily. , Disp: , Rfl:  .  exemestane (AROMASIN) 25 MG tablet, Take 25 mg by mouth daily after breakfast., Disp: , Rfl:  .  folic acid (FOLVITE) 1 MG tablet, Take 1 tablet (1 mg total) by mouth daily., Disp: 90 tablet, Rfl: 1 .  HYDROcodone-acetaminophen  (NORCO/VICODIN) 5-325 MG per tablet, Take 1-2 tablets by mouth every 6 (six) hours as needed for moderate pain., Disp: 50 tablet, Rfl: 0 .  levothyroxine (SYNTHROID) 125 MCG tablet, Take 1 tablet (125 mcg total) by mouth daily., Disp: 90 tablet, Rfl: 1 .  LORazepam (ATIVAN) 1 MG tablet, Take 1 mg by mouth at bedtime., Disp: , Rfl:  .  losartan-hydrochlorothiazide (HYZAAR) 100-25 MG per tablet, Take 1 tablet by mouth daily. , Disp: , Rfl:  .  meloxicam (MOBIC) 7.5 MG tablet, , Disp: , Rfl:  .  methocarbamol (ROBAXIN) 500 MG tablet, Take 1 tablet (500 mg total) by mouth every 6 (six) hours as needed for muscle spasms., Disp: 50 tablet, Rfl: 0 .  Multiple Vitamins-Minerals (CENTRUM SILVER ADULT 50+ PO), Take by mouth once., Disp: , Rfl:  .  ondansetron (ZOFRAN-ODT) 8 MG disintegrating tablet, Take 8 mg by mouth every 8 (eight) hours as needed for nausea. , Disp: , Rfl:  .  potassium chloride SA (K-DUR,KLOR-CON) 20 MEQ tablet, Take 1 tablet (20 mEq total) by mouth daily., Disp: 90 tablet, Rfl: 1  Allergies:  Allergies  Allergen Reactions  . Ambien [Zolpidem]     confusion  . Pyridium [Phenazopyridine Hcl] Other (See Comments)    hemolysis  . Sulfa Antibiotics Rash  Past Medical History, Surgical history, Social history, and Family History were reviewed and updated.  Review of Systems: As above  Physical Exam:  height is 5\' 3"  (1.6 m) and weight is 150 lb (68.04 kg). Her oral temperature is 98 F (36.7 C). Her blood pressure is 118/59 and her pulse is 72. Her respiration is 14.   [name]. She has no ocular or oral lesions. There is no adenopathy in the neck. Her lungs are clear. Cardiac exam regular rate and rhythm. Skin exam shows seborrheic keratoses. Back exam shows kyphosis. Breast exam shows a left breast with a well-healed lumpectomy. This is at the 4:00 position. She has no left breast mass. there is no left axillary adenopathy. Right breast shows a lumpectomy of the 9:00  position. The biopsy site is well-healed. There is no right breast masses the right axillary adenopathy. Abdomen is soft. She has good bowel sounds. There is no abdominal mass. There is no palpable liver or spleen. Back exam shows some kyphosis. There is no tenderness over the spine. Extremities do show some chronic edema in the legs. She has minimal swelling in her knees. She has well-healed surgical scars with her knees. Neurological exam nonfocal.  Lab Results  Component Value Date   WBC 7.3 12/18/2014   HGB 14.3 12/18/2014   HCT 43.4 12/18/2014   MCV 92 12/18/2014   PLT 194 12/18/2014     Chemistry      Component Value Date/Time   NA 134* 08/23/2014 0540   K 4.1 08/23/2014 0540   CL 95* 08/23/2014 0540   CO2 28 08/23/2014 0540   BUN 11 08/23/2014 0540   CREATININE 0.74 08/23/2014 0540   CREATININE 0.93 06/02/2012 1511      Component Value Date/Time   CALCIUM 8.9 08/23/2014 0540   ALKPHOS 54 02/20/2014 1518   AST 25 02/20/2014 1518   ALT 20 02/20/2014 1518   BILITOT 0.5 02/20/2014 1518         Impression and Plan: Hannah Galvan isan 81 year old white female. She is history of 2 separate breast primaries. She had right breast cancer that that was invasive.. This was diagnosed back in 2000. She had ductal carcinoma in situ of the left breast. This was diagnosed in 2007. Currently, I do not see any issues with either of these.  I feel bad that she is going through a hard time right now. She lost her son tragically this past year. Is now been 2 years since her husband passed away.  We will plan to get her back in another 6 months.  I don't see any problems with her if she needs to have the surgery.   Volanda Napoleon, MD 1/25/20162:55 PM

## 2014-12-19 NOTE — Telephone Encounter (Signed)
No, we have not received them yet.  I am following up on it.

## 2014-12-19 NOTE — Telephone Encounter (Signed)
Do we have these results yet? Thanks!

## 2014-12-20 ENCOUNTER — Institutional Professional Consult (permissible substitution): Payer: Self-pay | Admitting: Internal Medicine

## 2014-12-20 NOTE — Telephone Encounter (Signed)
Copy of results faxed to me today, put in Dr. Bari Mantis box for review this afternoon.  Will contact patient once RA has had a chance to review results.

## 2014-12-20 NOTE — Telephone Encounter (Signed)
Called [name] at the heart center, she says that the doctor has not reviewed the results yet and she will check on the results and call me back.  Awaiting call back.

## 2014-12-21 NOTE — Telephone Encounter (Signed)
Per Dr. Elsworth Soho, results were normal sinus rhythm with Premature Atrial Contractions, no interventions.  Patient notified.  No questions or concerns at this time. Nothing further needed.

## 2015-04-20 ENCOUNTER — Other Ambulatory Visit: Payer: Self-pay | Admitting: Endocrinology

## 2015-04-20 DIAGNOSIS — R911 Solitary pulmonary nodule: Secondary | ICD-10-CM

## 2015-04-26 ENCOUNTER — Ambulatory Visit
Admission: RE | Admit: 2015-04-26 | Discharge: 2015-04-26 | Disposition: A | Discharge status: Home / Self Care | Payer: Medicare Other | Source: Ambulatory Visit | Attending: Endocrinology | Admitting: Endocrinology

## 2015-04-26 DIAGNOSIS — R911 Solitary pulmonary nodule: Secondary | ICD-10-CM

## 2015-05-11 ENCOUNTER — Ambulatory Visit: Payer: No Typology Code available for payment source | Admitting: Nurse Practitioner

## 2015-06-18 ENCOUNTER — Other Ambulatory Visit (HOSPITAL_BASED_OUTPATIENT_CLINIC_OR_DEPARTMENT_OTHER): Payer: Medicare Other

## 2015-06-18 ENCOUNTER — Encounter: Payer: Self-pay | Admitting: Family

## 2015-06-18 ENCOUNTER — Ambulatory Visit (HOSPITAL_BASED_OUTPATIENT_CLINIC_OR_DEPARTMENT_OTHER): Payer: Medicare Other | Admitting: Family

## 2015-06-18 VITALS — BP 141/54 | HR 87 | Ht 63.0 in | Wt 153.0 lb

## 2015-06-18 DIAGNOSIS — C50919 Malignant neoplasm of unspecified site of unspecified female breast: Secondary | ICD-10-CM | POA: Diagnosis not present

## 2015-06-18 LAB — CMP (CANCER CENTER ONLY)
ALBUMIN: 4 g/dL (ref 3.3–5.5)
ALT(SGPT): 36 U/L (ref 10–47)
AST: 37 U/L (ref 11–38)
Alkaline Phosphatase: 59 U/L (ref 26–84)
BUN: 15 mg/dL (ref 7–22)
CO2: 26 mEq/L (ref 18–33)
CREATININE: 1.1 mg/dL (ref 0.6–1.2)
Calcium: 9.4 mg/dL (ref 8.0–10.3)
Chloride: 100 mEq/L (ref 98–108)
Glucose, Bld: 176 mg/dL — ABNORMAL HIGH (ref 73–118)
Potassium: 4.2 mEq/L (ref 3.3–4.7)
Sodium: 138 mEq/L (ref 128–145)
Total Bilirubin: 0.7 mg/dl (ref 0.20–1.60)
Total Protein: 7.3 g/dL (ref 6.4–8.1)

## 2015-06-18 LAB — CBC WITH DIFFERENTIAL (CANCER CENTER ONLY)
BASO#: 0 10*3/uL (ref 0.0–0.2)
BASO%: 0.3 % (ref 0.0–2.0)
EOS ABS: 0 10*3/uL (ref 0.0–0.5)
EOS%: 0.3 % (ref 0.0–7.0)
HCT: 43 % (ref 34.8–46.6)
HGB: 15 g/dL (ref 11.6–15.9)
LYMPH#: 1.5 10*3/uL (ref 0.9–3.3)
LYMPH%: 16.4 % (ref 14.0–48.0)
MCH: 32.5 pg (ref 26.0–34.0)
MCHC: 34.9 g/dL (ref 32.0–36.0)
MCV: 93 fL (ref 81–101)
MONO#: 0.3 10*3/uL (ref 0.1–0.9)
MONO%: 3.7 % (ref 0.0–13.0)
NEUT%: 79.3 % (ref 39.6–80.0)
NEUTROS ABS: 7.1 10*3/uL — AB (ref 1.5–6.5)
Platelets: 237 10*3/uL (ref 145–400)
RBC: 4.61 10*6/uL (ref 3.70–5.32)
RDW: 12.7 % (ref 11.1–15.7)
WBC: 8.9 10*3/uL (ref 3.9–10.0)

## 2015-06-18 NOTE — Progress Notes (Signed)
Hematology and Oncology Follow Up Visit  Hannah Galvan [id number] 1931-06-15 81 y.o. 06/18/2015   Principle Diagnosis:  1. Ductal carcinoma in situ of the left breast. 2. History of stage IIA (T1 N1 M0) ductal carcinoma of the right breast. 3. History of medication induced hemolytic anemia  Current Therapy:   Aromasin 25 mg p.o. daily for prophylaxis    Interim History:  Hannah Galvan is here today for a follow-up. She is doing fairly well. She is still having a hard time missing her husband and son. She really is heart broken. Unfortunately, she does not have as good of a relationship with her older son. She is involved in her church but feels like she may need to try a new church with a smaller congregation.  She has had some SOB at times and palpitations. She quite taking her antidepressants because "they made her tired."  She has had no problem with infections. No fever, chills, hot flashes, n/v, cough, rash, dizziness, headaches, vision changes, chest pain, changes in bowel or bladder habits. No blood in her urine or stool.  There have been no changes with her chest. No mass, lesions, rash or lymphadenopathy found on exam. Her mammogram in December 2015 was negative.  She has lower back and knee pain and recently saw [facility]. She has had bilateral total knee replacements. She is being referred to Dr. Mina Marble for evaluation for therapeutic injection.  She has some mild numbness and tingling in her feet. This has not effected her gait. She has had no falls.  Her appetite is "ok" and she is staying hydrated. Her weight is stable.    Medications:    Medication List       This list is accurate as of: 06/18/15  2:35 PM.  Always use your most recent med list.               aspirin EC 81 MG tablet  Take 81 mg by mouth daily.     atorvastatin 20 MG tablet  Commonly known as:  LIPITOR  Take 10 mg by mouth daily.     Calcium Carbonate-Vitamin D 600-200 MG-UNIT Tabs  Take 1  tablet by mouth 2 (two) times daily.     CENTRUM SILVER ADULT 50+ PO  Take by mouth once.     CRANBERRY PO  Take 1 tablet by mouth daily. 450 mg daily     escitalopram 10 MG tablet  Commonly known as:  LEXAPRO  Take 10 mg by mouth daily.     exemestane 25 MG tablet  Commonly known as:  AROMASIN  Take 25 mg by mouth daily after breakfast.     folic acid 1 MG tablet  Commonly known as:  FOLVITE  Take 1 tablet (1 mg total) by mouth daily.     HYDROcodone-acetaminophen 5-325 MG per tablet  Commonly known as:  NORCO/VICODIN  Take 1-2 tablets by mouth every 6 (six) hours as needed for moderate pain.     levothyroxine 125 MCG tablet  Commonly known as:  SYNTHROID  Take 1 tablet (125 mcg total) by mouth daily.     LORazepam 1 MG tablet  Commonly known as:  ATIVAN  Take 1 mg by mouth at bedtime.     losartan-hydrochlorothiazide 100-25 MG per tablet  Commonly known as:  HYZAAR  Take 1 tablet by mouth daily.     meloxicam 7.5 MG tablet  Commonly known as:  MOBIC     methocarbamol 500 MG  tablet  Commonly known as:  ROBAXIN  Take 1 tablet (500 mg total) by mouth every 6 (six) hours as needed for muscle spasms.     ondansetron 8 MG disintegrating tablet  Commonly known as:  ZOFRAN-ODT  Take 8 mg by mouth every 8 (eight) hours as needed for nausea.     potassium chloride SA 20 MEQ tablet  Commonly known as:  K-DUR,KLOR-CON  Take 1 tablet (20 mEq total) by mouth daily.        Allergies:  Allergies  Allergen Reactions  . Ambien [Zolpidem]     confusion  . Pyridium [Phenazopyridine Hcl] Other (See Comments)    hemolysis  . Sulfa Antibiotics Rash    Past Medical History, Surgical history, Social history, and Family History were reviewed and updated.  Review of Systems: All other 10 point review of systems is negative.   Physical Exam:  height is 5\' 3"  (1.6 m) and weight is 153 lb (69.4 kg). Her blood pressure is 141/54 and her pulse is 87.   Wt Readings from Last  3 Encounters:  06/18/15 153 lb (69.4 kg)  12/18/14 150 lb (68.04 kg)  12/06/14 151 lb 3.2 oz (68.584 kg)    Ocular: Sclerae unicteric, pupils equal, round and reactive to light Ear-nose-throat: Oropharynx clear, dentition fair Lymphatic: No cervical or supraclavicular adenopathy Lungs no rales or rhonchi, good excursion bilaterally Heart regular rate and rhythm, no murmur appreciated Abd soft, nontender, positive bowel sounds MSK no focal spinal tenderness, no joint edema Neuro: non-focal, well-oriented, appropriate affect Breasts: No changes. No mass, lesion, rash or lymphadenopathy.   Lab Results  Component Value Date   WBC 7.3 12/18/2014   HGB 14.3 12/18/2014   HCT 43.4 12/18/2014   MCV 92 12/18/2014   PLT 194 12/18/2014   Lab Results  Component Value Date   FERRITIN 181 02/26/2007   Lab Results  Component Value Date   RETICCTPCT 1.0 08/02/2009   RBC 4.71 12/18/2014   RETICCTABS 43.3 08/02/2009   No results found for: KPAFRELGTCHN, LAMBDASER, KAPLAMBRATIO No results found for: Osborne Casco Lab Results  Component Value Date   TOTALPROTELP 7.1 02/26/2007   ALBUMINELP 66.0 02/26/2007   A1GS 4.8 02/26/2007   A2GS 9.4 02/26/2007   BETS 5.6 02/26/2007   BETA2SER 3.4 02/26/2007   GAMS 10.8* 02/26/2007   MSPIKE NOT DET 02/26/2007   SPEI * 02/26/2007     Chemistry      Component Value Date/Time   NA 137 12/18/2014 1343   K 4.3 12/18/2014 1343   CL 99 12/18/2014 1343   CO2 28 12/18/2014 1343   BUN 20 12/18/2014 1343   CREATININE 0.86 12/18/2014 1343   CREATININE 0.93 06/02/2012 1511      Component Value Date/Time   CALCIUM 9.8 12/18/2014 1343   ALKPHOS 57 12/18/2014 1343   AST 31 12/18/2014 1343   ALT 25 12/18/2014 1343   BILITOT 0.4 12/18/2014 1343     Impression and Plan: Hannah Galvan is an 81 yo female with a history of 2 separate breast primaries. She had invasive right breast cancer diagnosed in 2000. She then had ductal carcinoma in situ of  the left breast diagnosed in 2007. At this time she is asymptomatic and there has been no evidence of recurrence.  She is taking Aromasin daily and has done well with this. No side effects.  Her biggest issue at this time seems to be depression missing her husband and son. She has stopped her antidepressants  due to "feeling tired." We will plan to see her back in 1 year for albs and follow-up.  She knows to call here with any questions or concerns. We can certainly see her sooner if need be.   Eliezer Bottom, NP 7/25/20162:35 PM

## 2015-06-19 LAB — VITAMIN D 25 HYDROXY (VIT D DEFICIENCY, FRACTURES): Vit D, 25-Hydroxy: 50 ng/mL (ref 30–100)

## 2015-06-24 NOTE — Progress Notes (Signed)
Cardiology Office Note   Date:  06/24/2015   ID:  Hannah Galvan, DOB [birth year], MRN [id number]  PCP:  Sheela Stack, MD  Cardiologist:  Dr. Darlin Coco   Electrophysiologist:  n/a  Chief Complaint  Patient presents with  . Follow-up    HTN     History of Present Illness: Hannah Galvan is a 81 y.o. female with a hx of    Studies/Reports Reviewed Today:  Holter 11/2014 NSR, PACs  Echo 06/2012 - Left ventricle: The cavity size was normal. Wall thickness was normal. Systolic function was normal. The estimated ejection fraction was in the range of 60% to 65%. Wall motion was normal; there were no regional wall motion abnormalities. Doppler parameters are consistent with abnormal left ventricular relaxation (grade 1 diastolic dysfunction). - Aortic valve: Trivial regurgitation. - Mitral valve: There was mild systolic anterior motion of the chordal structures. Mild regurgitation. - Pulmonary arteries: Systolic pressure was mildly increased. PA peak pressure: 54mm Hg (S).  Event Monitor 07/2012 NSR, PACs, PVCs  Carotid US 10/12 1. No significant carotid bifurcation plaque or stenosis.   Past Medical History  Diagnosis Date  . Thyroid disease   . Hypertension   . Hyperlipidemia   . Arthritis   . Cancer     breast, s/p RXT, bilateral  . Hypothyroidism   . Shortness of breath   . Anxiety     Past Surgical History  Procedure Laterality Date  . Abdominal hysterectomy  2008  . Thyroid surgery  2002  . Breast lumpectomy  2000  . Breast lumpectomy  2007  . Cataract extraction  2011  . Rotator cuff repair      right  . Knee surgery      right  . Bladder tact    . Total knee arthroplasty Left 06/02/2013    Dr Rhona Raider  . Total knee arthroplasty Right 06/02/2013    Procedure: RIGHT TOTAL KNEE ARTHROPLASTY;  Surgeon: Hessie Dibble, MD;  Location: [facility] [place] [facility];  Service: Orthopedics;  Laterality: Right;  . Total knee arthroplasty Left  08/22/2014    Procedure: TOTAL KNEE ARTHROPLASTY;  Surgeon: Hessie Dibble, MD;  Location: [place];  Service: Orthopedics;  Laterality: Left;     Current Outpatient Prescriptions  Medication Sig Dispense Refill  . aspirin EC 81 MG tablet Take 81 mg by mouth daily.    Marland Kitchen atorvastatin (LIPITOR) 20 MG tablet Take 10 mg by mouth daily.     . Calcium Carbonate-Vitamin D (CALCIUM + D) 600-200 MG-UNIT TABS Take 1 tablet by mouth 2 (two) times daily.      . clonazePAM (KLONOPIN) 0.5 MG tablet TAKE 1 TABLET EVERY 8 HOURS  1  . CRANBERRY PO Take 1 tablet by mouth daily. 450 mg daily    . exemestane (AROMASIN) 25 MG tablet Take 25 mg by mouth daily after breakfast.    . folic acid (FOLVITE) 1 MG tablet Take 1 tablet (1 mg total) by mouth daily. 90 tablet 1  . furosemide (LASIX) 20 MG tablet TAKE ONE TABLET BY MOUTH TWICE WEEKLY FOR SWELLING  1  . HYDROcodone-acetaminophen (NORCO/VICODIN) 5-325 MG per tablet Take 1-2 tablets by mouth every 6 (six) hours as needed for moderate pain. 50 tablet 0  . levothyroxine (SYNTHROID) 125 MCG tablet Take 1 tablet (125 mcg total) by mouth daily. 90 tablet 1  . LORazepam (ATIVAN) 1 MG tablet Take 1 mg by mouth at bedtime.    Marland Kitchen losartan-hydrochlorothiazide (HYZAAR) 100-25 MG per  tablet Take 1 tablet by mouth daily.     . Multiple Vitamins-Minerals (CENTRUM SILVER ADULT 50+ PO) Take by mouth once.    . ondansetron (ZOFRAN-ODT) 8 MG disintegrating tablet Take 8 mg by mouth every 8 (eight) hours as needed for nausea.     . potassium chloride SA (K-DUR,KLOR-CON) 20 MEQ tablet Take 1 tablet (20 mEq total) by mouth daily. 90 tablet 1  . predniSONE (STERAPRED UNI-PAK 21 TAB) 5 MG (21) TBPK tablet See admin instructions.  0   No current facility-administered medications for this visit.    Allergies:   Ambien; Pyridium; and Sulfa antibiotics    Social History:  The patient  reports that she has never smoked. She has never used smokeless tobacco. She reports that she does not  drink alcohol or use illicit drugs.   Family History:  The patient's family history includes Cancer in her brother, brother, brother, brother, brother, father, sister, sister, and sister; Heart disease in her sister; Hypertension in her mother; Stroke in her mother.    ROS:   Please see the history of present illness.   ROS    PHYSICAL EXAM: VS:  There were no vitals taken for this visit.    Wt Readings from Last 3 Encounters:  06/18/15 153 lb (69.4 kg)  12/18/14 150 lb (68.04 kg)  12/06/14 151 lb 3.2 oz (68.584 kg)     GEN: Well nourished, well developed, in no acute distress HEENT: normal Neck: no JVD, no carotid bruits, no masses Cardiac:  Normal S1/S2, RRR; no murmur ,  no rubs or gallops, no edema  Respiratory:  clear to auscultation bilaterally, no wheezing, rhonchi or rales. GI: soft, nontender, nondistended, + BS MS: no deformity or atrophy Skin: warm and dry  Neuro:  CNs II-XII intact, Strength and sensation are intact Psych: Normal affect   EKG:  EKG is ordered today.  It demonstrates:      Recent Labs: 06/18/2015: ALT(SGPT) 36; BUN, Bld 15; Creat 1.1; HGB 15.0; Platelets 237; Potassium 4.2; Sodium 138    Lipid Panel    Component Value Date/Time   CHOL 164 10/04/2012 0840   TRIG 64.0 10/04/2012 0840   HDL 60.50 10/04/2012 0840   CHOLHDL 3 10/04/2012 0840   VLDL 12.8 10/04/2012 0840   LDLCALC 91 10/04/2012 0840      ASSESSMENT AND PLAN:  No diagnosis found.     Medication Changes: Current medicines are reviewed at length with the patient today.  Concerns regarding medicines are as outlined above.  The following changes have been made:   Discontinued Medications   No medications on file   Modified Medications   No medications on file   New Prescriptions   No medications on file     Labs/ tests ordered today include:   No orders of the defined types were placed in this encounter.     Disposition:   FU with   Signed, Versie Starks, MHS 06/24/2015 10:03 PM    [facility] [place], [place], [place]  [address] Phone: [phone]; Fax: [phone]    This encounter was created in error - please disregard.

## 2015-06-25 ENCOUNTER — Encounter (INDEPENDENT_AMBULATORY_CARE_PROVIDER_SITE_OTHER): Payer: Medicare Other | Admitting: Physician Assistant

## 2015-06-28 ENCOUNTER — Encounter: Payer: Self-pay | Admitting: Physician Assistant

## 2015-06-28 ENCOUNTER — Ambulatory Visit (INDEPENDENT_AMBULATORY_CARE_PROVIDER_SITE_OTHER): Payer: Medicare Other | Admitting: Physician Assistant

## 2015-06-28 VITALS — BP 120/58 | HR 77 | Ht 62.0 in | Wt 153.4 lb

## 2015-06-28 DIAGNOSIS — R0602 Shortness of breath: Secondary | ICD-10-CM | POA: Diagnosis not present

## 2015-06-28 DIAGNOSIS — R079 Chest pain, unspecified: Secondary | ICD-10-CM

## 2015-06-28 DIAGNOSIS — E785 Hyperlipidemia, unspecified: Secondary | ICD-10-CM

## 2015-06-28 DIAGNOSIS — I10 Essential (primary) hypertension: Secondary | ICD-10-CM

## 2015-06-28 DIAGNOSIS — R002 Palpitations: Secondary | ICD-10-CM

## 2015-06-28 DIAGNOSIS — R011 Cardiac murmur, unspecified: Secondary | ICD-10-CM

## 2015-06-28 LAB — CBC WITH DIFFERENTIAL/PLATELET
Basophils Absolute: 0 10*3/uL (ref 0.0–0.1)
Basophils Relative: 0.5 % (ref 0.0–3.0)
Eosinophils Absolute: 0.3 10*3/uL (ref 0.0–0.7)
Eosinophils Relative: 2.9 % (ref 0.0–5.0)
HCT: 42.9 % (ref 36.0–46.0)
Hemoglobin: 14.5 g/dL (ref 12.0–15.0)
LYMPHS PCT: 24.3 % (ref 12.0–46.0)
Lymphs Abs: 2.4 10*3/uL (ref 0.7–4.0)
MCHC: 33.8 g/dL (ref 30.0–36.0)
MCV: 93.8 fl (ref 78.0–100.0)
Monocytes Absolute: 0.6 10*3/uL (ref 0.1–1.0)
Monocytes Relative: 6.4 % (ref 3.0–12.0)
NEUTROS ABS: 6.6 10*3/uL (ref 1.4–7.7)
NEUTROS PCT: 65.9 % (ref 43.0–77.0)
PLATELETS: 225 10*3/uL (ref 150.0–400.0)
RBC: 4.57 Mil/uL (ref 3.87–5.11)
RDW: 13.8 % (ref 11.5–15.5)
WBC: 10.1 10*3/uL (ref 4.0–10.5)

## 2015-06-28 LAB — BASIC METABOLIC PANEL
BUN: 13 mg/dL (ref 6–23)
CHLORIDE: 98 meq/L (ref 96–112)
CO2: 32 mEq/L (ref 19–32)
Calcium: 10.4 mg/dL (ref 8.4–10.5)
Creatinine, Ser: 0.9 mg/dL (ref 0.40–1.20)
GFR: 63.7 mL/min (ref >60.00)
Glucose, Bld: 87 mg/dL (ref 70–99)
Potassium: 4 mEq/L (ref 3.5–5.1)
Sodium: 136 mEq/L (ref 135–145)

## 2015-06-28 LAB — BRAIN NATRIURETIC PEPTIDE: Pro B Natriuretic peptide (BNP): 44 pg/mL (ref 0.0–100.0)

## 2015-06-28 MED ORDER — METOPROLOL SUCCINATE ER 25 MG PO TB24: 12.5000 mg | ORAL_TABLET | Freq: Every day | ORAL | Status: DC

## 2015-06-28 NOTE — Patient Instructions (Signed)
Medication Instructions:  1. START TOPROL XL 25 MG TABLET WITH THE DIRECTIONS TO TAKE 1/2 TAB AT BED TIME = 12.5MG    Labwork: TODAY BMET, CBC W/DIFF, BNP  Testing/Procedures: Your physician has requested that you have an echocardiogram. Echocardiography is a painless test that uses sound waves to create images of your heart. It provides your doctor with information about the size and shape of your heart and how well your heart's chambers and valves are working. This procedure takes approximately one hour. There are no restrictions for this procedure.  Your physician has requested that you have a lexiscan myoview. For further information please visit [email or web address]. Please follow instruction sheet, as given.  Your physician has recommended that you wear an event monitor. Event monitors are medical devices that record the heart's electrical activity. Doctors most often [place] these monitors to diagnose arrhythmias. Arrhythmias are problems with the speed or rhythm of the heartbeat. The monitor is a small, portable device. You can wear one while you do your normal daily activities. This is usually used to diagnose what is causing palpitations/syncope (passing out).   Follow-Up: 4-5 WEEKS WITH SCOTT WEAVER, [facility]   Any Other Special Instructions Will Be Listed Below (If Applicable).

## 2015-06-28 NOTE — Progress Notes (Signed)
Cardiology Office Note   Date:  06/28/2015   ID:  ADLYN FIFE, DOB [birth year], MRN [id number]  PCP:  Sheela Stack, MD  Cardiologist:  Dr. Darlin Coco   Electrophysiologist:  n/a  Chief Complaint  Patient presents with  . Shortness of Breath  . Palpitations     History of Present Illness: Hannah Galvan is a 81 y.o. female with a hx of HTN, HL, hypothyroidism, palpitations, breast CA (R in 2000, L in 2007), HL (intol to Crestor), mild AI, mild MR.  Last seen by Dr. Mare Ferrari 11/2012.  She is here today with her daughter-in-law. She notes a long history of palpitations. Over the last several months, she has noted a rapid fluttering in her chest. This typically occurs in the morning. She has associated dyspnea. She also notes shortness of breath with certain activities. Overall, she is NYHA 2b. She denies orthopnea or PND. She does have dependent pedal edema. Her orthopedist gave her Lasix to take as needed. She does note some tightness in her chest when she exerts herself at times. She denies any radiating symptoms or other associated symptoms. She denies syncope. She can sometimes take Klonopin to help her palpitations.   Studies/Reports Reviewed Today:  Holter 11/2014 NSR, PACs  Echo 06/2012 EF 60-65%, normal wall motion, grade 1 diastolic dysfunction, trivial AI, mild MR, mild SAM, PASP 36 mmHg  Event Monitor 07/2012 NSR, PACs, PVCs  Carotid US 10/12 No significant carotid bifurcation plaque or stenosis.   Past Medical History  Diagnosis Date  . Thyroid disease   . Hypertension   . Hyperlipidemia   . Arthritis   . Cancer     breast, s/p RXT, bilateral  . Hypothyroidism   . Shortness of breath   . Anxiety     Past Surgical History  Procedure Laterality Date  . Abdominal hysterectomy  2008  . Thyroid surgery  2002  . Breast lumpectomy  2000  . Breast lumpectomy  2007  . Cataract extraction  2011  . Rotator cuff repair      right  . Knee surgery      right  . Bladder tact    . Total knee arthroplasty Left 06/02/2013    Dr Rhona Raider  . Total knee arthroplasty Right 06/02/2013    Procedure: RIGHT TOTAL KNEE ARTHROPLASTY;  Surgeon: Hessie Dibble, MD;  Location: [place];  Service: Orthopedics;  Laterality: Right;  . Total knee arthroplasty Left 08/22/2014    Procedure: TOTAL KNEE ARTHROPLASTY;  Surgeon: Hessie Dibble, MD;  Location: [place];  Service: Orthopedics;  Laterality: Left;     Current Outpatient Prescriptions  Medication Sig Dispense Refill  . aspirin EC 81 MG tablet Take 81 mg by mouth daily.    Marland Kitchen atorvastatin (LIPITOR) 20 MG tablet Take 10 mg by mouth daily.     . Calcium Carbonate-Vitamin D (CALCIUM + D) 600-200 MG-UNIT TABS Take 1 tablet by mouth 2 (two) times daily.      . clonazePAM (KLONOPIN) 0.5 MG tablet TAKE 1 TABLET EVERY 8 HOURS  1  . CRANBERRY PO Take 1 tablet by mouth daily. 450 mg daily    . exemestane (AROMASIN) 25 MG tablet Take 25 mg by mouth daily after breakfast.    . folic acid (FOLVITE) 1 MG tablet Take 1 tablet (1 mg total) by mouth daily. 90 tablet 1  . HYDROcodone-acetaminophen (NORCO/VICODIN) 5-325 MG per tablet Take 1-2 tablets by mouth every 6 (six) hours as needed for  moderate pain. 50 tablet 0  . levothyroxine (SYNTHROID) 125 MCG tablet Take 1 tablet (125 mcg total) by mouth daily. 90 tablet 1  . LORazepam (ATIVAN) 1 MG tablet Take 1 mg by mouth at bedtime.    Marland Kitchen losartan-hydrochlorothiazide (HYZAAR) 100-25 MG per tablet Take 1 tablet by mouth daily.     . Multiple Vitamins-Minerals (CENTRUM SILVER ADULT 50+ PO) Take by mouth once.    . ondansetron (ZOFRAN-ODT) 8 MG disintegrating tablet Take 8 mg by mouth every 8 (eight) hours as needed for nausea.     . potassium chloride SA (K-DUR,KLOR-CON) 20 MEQ tablet Take 1 tablet (20 mEq total) by mouth daily. 90 tablet 1  . rosuvastatin (CRESTOR) 20 MG tablet Take 20 mg by mouth daily. Patient taking 1/2 tablet by mouth once daily    . metoprolol succinate  (TOPROL-XL) 25 MG 24 hr tablet Take 0.5 tablets (12.5 mg total) by mouth at bedtime. 30 tablet 11   No current facility-administered medications for this visit.    Allergies:   Ambien; Pyridium; and Sulfa antibiotics    Social History:  The patient  reports that she has never smoked. She has never used smokeless tobacco. She reports that she does not drink alcohol or use illicit drugs.   Family History:  The patient's family history includes Cancer in her brother, brother, brother, brother, brother, father, sister, sister, and sister; Heart disease in her sister; Hypertension in her mother; Stroke in her mother.    ROS:   Please see the history of present illness.   Review of Systems  Constitution: Positive for malaise/fatigue.  Cardiovascular: Positive for dyspnea on exertion, irregular heartbeat and leg swelling.  Hematologic/Lymphatic: Bruises/bleeds easily.  Musculoskeletal: Positive for back pain and joint pain.  Psychiatric/Behavioral: The patient is nervous/anxious.   All other systems reviewed and are negative.     PHYSICAL EXAM: VS:  BP 120/58 mmHg  Pulse 77  Ht 5\' 2"  (1.575 m)  Wt 153 lb 6.4 oz (69.582 kg)  BMI 28.05 kg/m2    Wt Readings from Last 3 Encounters:  06/28/15 153 lb 6.4 oz (69.582 kg)  06/18/15 153 lb (69.4 kg)  12/18/14 150 lb (68.04 kg)     GEN: Well nourished, well developed, in no acute distress HEENT: normal Neck: no JVD, no carotid bruits, no masses Cardiac:  Normal S1/S2, RRR; 4-1/3 systolic murmur RUSB,  no rubs or gallops, trace-1+ bilateral ankle edema  Respiratory:  clear to auscultation bilaterally, no wheezing, rhonchi or rales. GI: soft, nontender, nondistended, + BS MS: no deformity or atrophy Skin: warm and dry  Neuro:  CNs II-XII intact, Strength and sensation are intact Psych: Normal affect   EKG:  EKG is ordered today.  It demonstrates:   NSR, HR 77, LAD, no change from prior tracings   Recent Labs: 06/18/2015: ALT(SGPT)  36; BUN, Bld 15; Creat 1.1; HGB 15.0; Platelets 237; Potassium 4.2; Sodium 138    Lipid Panel    Component Value Date/Time   CHOL 164 10/04/2012 0840   TRIG 64.0 10/04/2012 0840   HDL 60.50 10/04/2012 0840   CHOLHDL 3 10/04/2012 0840   VLDL 12.8 10/04/2012 0840   LDLCALC 91 10/04/2012 0840      ASSESSMENT AND PLAN:  1. Palpitations: She does have episodes of rapid palpitations that last between 10 minutes and 1 hour. She can take Klonopin with relief. She does have associated dyspnea. Her daughter-in-law tells me that she does snore. Question if she may have sleep  apnea. Given her age, I'm concerned that she may have atrial fibrillation. I will arrange 30 day event monitor. She also has a murmur on exam. I will obtain an echocardiogram. I will give her a prescription for Toprol XL 12.5 mg daily at bedtime.  2. Shortness of Breath: The patient notes dyspnea with exertion. She also has dyspnea with her palpitations. She has some vague chest tightness as well. ECG is normal. Obtain echocardiogram as noted. I will obtain a BMET, BNP and CBC. She does have LE edema that seems to be related to venous insufficiency. If her BNP is elevated, I will have her take Lasix on a daily basis. Obtain Lexiscan Myoview to rule out ischemia.  3. Snoring: I have asked her family to keep an eye on her. If she continues to have loud snoring, we may need to proceed with sleep study.  4. Hypertension: Controlled.  5. Hyperlipidemia: Continue statin.     Medication Changes: Current medicines are reviewed at length with the patient today.  Concerns regarding medicines are as outlined above.  The following changes have been made:   Discontinued Medications   FUROSEMIDE (LASIX) 20 MG TABLET    TAKE ONE TABLET BY MOUTH TWICE WEEKLY FOR SWELLING   PREDNISONE (STERAPRED UNI-PAK 21 TAB) 5 MG (21) TBPK TABLET    See admin instructions.   Modified Medications   No medications on file   New Prescriptions    METOPROLOL SUCCINATE (TOPROL-XL) 25 MG 24 HR TABLET    Take 0.5 tablets (12.5 mg total) by mouth at bedtime.    Labs/ tests ordered today include:   Orders Placed This Encounter  Procedures  . Basic Metabolic Panel (BMET)  . CBC w/Diff  . B Nat Peptide  . Cardiac event monitor  . Myocardial Perfusion Imaging  . EKG 12-Lead  . Echocardiogram     Disposition:   FU with me 4-5 weeks.    Signed, Versie Starks, MHS 06/28/2015 3:42 PM    [facility] [place], [place], [place]  [address] Phone: [phone]; Fax: [phone]

## 2015-06-29 ENCOUNTER — Telehealth (HOSPITAL_COMMUNITY): Payer: Self-pay

## 2015-06-29 NOTE — Telephone Encounter (Signed)
Left message on voicemail per DPR in reference to upcoming appointment scheduled on 07-03-2015 at 1200 with detailed instructions given per Myocardial Perfusion Study Information Sheet for the test. LM to arrive 15 minutes early, and that it is imperative to arrive on time for appointment to keep from having the test rescheduled.Phone number given for call back for any questions. Oletta Lamas, [place] A

## 2015-07-03 ENCOUNTER — Telehealth: Payer: Self-pay | Admitting: *Deleted

## 2015-07-03 ENCOUNTER — Encounter: Payer: Self-pay | Admitting: Physician Assistant

## 2015-07-03 ENCOUNTER — Other Ambulatory Visit (INDEPENDENT_AMBULATORY_CARE_PROVIDER_SITE_OTHER): Payer: Medicare Other | Admitting: *Deleted

## 2015-07-03 ENCOUNTER — Ambulatory Visit (HOSPITAL_COMMUNITY): Payer: Medicare Other | Attending: Cardiovascular Disease

## 2015-07-03 ENCOUNTER — Encounter (HOSPITAL_COMMUNITY): Payer: Self-pay

## 2015-07-03 DIAGNOSIS — R0602 Shortness of breath: Secondary | ICD-10-CM | POA: Diagnosis not present

## 2015-07-03 DIAGNOSIS — R002 Palpitations: Secondary | ICD-10-CM

## 2015-07-03 DIAGNOSIS — R079 Chest pain, unspecified: Secondary | ICD-10-CM | POA: Insufficient documentation

## 2015-07-03 DIAGNOSIS — E079 Disorder of thyroid, unspecified: Secondary | ICD-10-CM | POA: Diagnosis not present

## 2015-07-03 LAB — MYOCARDIAL PERFUSION IMAGING
CHL CUP NUCLEAR SRS: 0
CHL CUP NUCLEAR SSS: 3
CHL CUP RESTING HR STRESS: 70 {beats}/min
CSEPPHR: 102 {beats}/min
LHR: 0.18
LV dias vol: 10 mL
LV sys vol: 53 mL
SDS: 3
TID: 0.91

## 2015-07-03 LAB — TSH: TSH: 0.49 u[IU]/mL (ref 0.35–4.50)

## 2015-07-03 MED ORDER — TECHNETIUM TC 99M SESTAMIBI GENERIC - CARDIOLITE
10.7000 | Freq: Once | INTRAVENOUS | Status: AC | PRN
  Administered 2015-07-03: 11 via INTRAVENOUS

## 2015-07-03 MED ORDER — TECHNETIUM TC 99M SESTAMIBI GENERIC - CARDIOLITE
32.8000 | Freq: Once | INTRAVENOUS | Status: AC | PRN
  Administered 2015-07-03: 32.8 via INTRAVENOUS

## 2015-07-03 MED ORDER — AMINOPHYLLINE 25 MG/ML IV SOLN
150.0000 mg | Freq: Once | INTRAVENOUS | Status: AC
  Administered 2015-07-03: 150 mg via INTRAVENOUS

## 2015-07-03 MED ORDER — REGADENOSON 0.4 MG/5ML IV SOLN
0.4000 mg | Freq: Once | INTRAVENOUS | Status: AC
Start: 2015-07-03 — End: 2015-07-03
  Administered 2015-07-03: 0.4 mg via INTRAVENOUS

## 2015-07-03 NOTE — Telephone Encounter (Signed)
Pt notified of lab results from 8/4. Pt advised we will get TSH while her for her stress test, pt said ok. Pt asked for something for anxiety before myoview today. I advised pt she has Clonazepam and Ativan already and that she can take one of these.

## 2015-07-03 NOTE — Progress Notes (Signed)
Patient complained of heart racing during 2nd images after [employer]. Patient on the monitor in NSR with rates 80's to 92.  BP 140/70, HR 82, O2 sat 97% RA. Patient given coke with Toprol XL 25mg  1/2 tablet at 1539. EKG ordered per Richardson Dopp, PAC. Patient feeling some better. [place] with the patient. OK for patient to leave per Richardson Dopp, PAC. Irven Baltimore, RN

## 2015-07-04 ENCOUNTER — Telehealth: Payer: Self-pay | Admitting: *Deleted

## 2015-07-04 NOTE — Telephone Encounter (Signed)
DPR states ok to leave detailed message on machine. left message lab and myoview normal; if any questions can call back [phone].

## 2015-07-09 ENCOUNTER — Ambulatory Visit (HOSPITAL_COMMUNITY): Payer: Medicare Other | Attending: Cardiology

## 2015-07-09 ENCOUNTER — Ambulatory Visit (INDEPENDENT_AMBULATORY_CARE_PROVIDER_SITE_OTHER): Payer: Medicare Other

## 2015-07-09 ENCOUNTER — Other Ambulatory Visit: Payer: Self-pay

## 2015-07-09 DIAGNOSIS — R0600 Dyspnea, unspecified: Secondary | ICD-10-CM | POA: Diagnosis present

## 2015-07-09 DIAGNOSIS — I10 Essential (primary) hypertension: Secondary | ICD-10-CM | POA: Diagnosis not present

## 2015-07-09 DIAGNOSIS — I517 Cardiomegaly: Secondary | ICD-10-CM | POA: Diagnosis not present

## 2015-07-09 DIAGNOSIS — I340 Nonrheumatic mitral (valve) insufficiency: Secondary | ICD-10-CM | POA: Diagnosis not present

## 2015-07-09 DIAGNOSIS — R002 Palpitations: Secondary | ICD-10-CM

## 2015-07-09 DIAGNOSIS — I351 Nonrheumatic aortic (valve) insufficiency: Secondary | ICD-10-CM | POA: Insufficient documentation

## 2015-07-09 DIAGNOSIS — R0602 Shortness of breath: Secondary | ICD-10-CM | POA: Diagnosis not present

## 2015-07-09 DIAGNOSIS — R011 Cardiac murmur, unspecified: Secondary | ICD-10-CM | POA: Diagnosis not present

## 2015-07-10 ENCOUNTER — Encounter: Payer: Self-pay | Admitting: Physician Assistant

## 2015-07-11 ENCOUNTER — Telehealth: Payer: Self-pay | Admitting: *Deleted

## 2015-07-11 NOTE — Telephone Encounter (Signed)
pt notified of echo results by phone with verbal understanding.

## 2015-07-16 ENCOUNTER — Telehealth: Payer: Self-pay | Admitting: Physician Assistant

## 2015-07-16 NOTE — Telephone Encounter (Signed)
New message      Talk to the nurse about the heart monitor she is wearing.  She do not want to wear it any more---she has had it on for 1 week

## 2015-07-16 NOTE — Telephone Encounter (Signed)
I cb pt to d/w her about the monitor. I advised pt since she snores PA wants to see if she has A-Fib or not. I explained what A-Fib and it's risk of stroke. Pt said doesn't think she has monitor on right. I will have Shelly call her about monitor placement.  I asked pt to please try to keep monitor on and advised pt again about the risk if she does have a-fib that we would have to treat have to treat her for this. Pt is frustrated and said she does not want to wear this anymore. I did get her to agree to wear for this week and that I will d/w Brynda Rim. PA when he is back in the office next week.

## 2015-07-17 ENCOUNTER — Telehealth: Payer: Self-pay | Admitting: *Deleted

## 2015-07-17 NOTE — Telephone Encounter (Signed)
Patient call regarding message she was having difficulties with monitor and did not know if she was using it correctly. Scheduled appointment 07/17/15, 1230PM, for patient to stop in to review monitor instructions.

## 2015-07-21 ENCOUNTER — Telehealth: Payer: Self-pay | Admitting: Internal Medicine

## 2015-07-21 NOTE — Telephone Encounter (Signed)
I was contacted by [employer] this evening with a report of the first documented episode of atrial fibrillation, HR 50's to 70's, for Hannah Galvan.  The monitor was placed for evaluation of palpitations following a recent visit with Richardson Dopp in cardiology clinic.  I will forward this result to Mr. Kathlen Mody and Dr. Mare Ferrari.  [employer] has faxed the tracings to their office as well for further review.  Nelva Bush, MD Cardiology Fellow

## 2015-07-22 ENCOUNTER — Telehealth: Payer: Self-pay | Admitting: Physician Assistant

## 2015-07-22 NOTE — Telephone Encounter (Signed)
Please obtain recent Event Monitor results from 8/27 that demonstrates AFib and bring to me for review. Richardson Dopp, PA-C   07/22/2015 1:23 PM

## 2015-07-23 ENCOUNTER — Telehealth: Payer: Self-pay | Admitting: Cardiology

## 2015-07-23 MED ORDER — APIXABAN 5 MG PO TABS: 5.0000 mg | ORAL_TABLET | Freq: Two times a day (BID) | ORAL | Status: DC

## 2015-07-23 NOTE — Telephone Encounter (Signed)
Advised patient, verbalized understanding  

## 2015-07-23 NOTE — Telephone Encounter (Signed)
Per Brynda Rim PA I s/w Shelly our monitor tech about obtaining the strip showing A-fib on 07/20/15 for PA review.

## 2015-07-23 NOTE — Telephone Encounter (Signed)
Please report.  The event monitor documented paroxysmal atrial flutter fibrillation.  This would be consistent with her history of intermittent palpitations. She should be on anticoagulation to prevent stroke.  Begin Apixaban 5 mg twice a day.  Have her see the anticoagulation clinic for instructions about Apixaban also.  She should stop her baby aspirin when she starts Apixaban.  Her heart rate is reasonably well controlled.  If necessary she can take an extra half Toprol on an as-needed basis.

## 2015-07-23 NOTE — Telephone Encounter (Signed)
Can we get hold of these tracings to review? TB

## 2015-07-23 NOTE — Telephone Encounter (Signed)
Dr. Mare Ferrari reviewed reading and will have her d/c ASA and start Eliquis 5 mg BID Advised patient, verbalized understanding

## 2015-07-23 NOTE — Telephone Encounter (Signed)
Patient will be started on apixaban 5 mg BID for paroxysmal atrial flutter/fibrillation.

## 2015-07-24 ENCOUNTER — Telehealth: Payer: Self-pay

## 2015-07-24 NOTE — Telephone Encounter (Signed)
Prior auth for Eliquis 5mg sent to [facility] via Cover My Meds. 

## 2015-07-25 ENCOUNTER — Telehealth: Payer: Self-pay | Admitting: Physician Assistant

## 2015-07-25 ENCOUNTER — Telehealth: Payer: Self-pay

## 2015-07-25 NOTE — Telephone Encounter (Signed)
Eliquis approved through [facility]. [id number]. Good through 07/23/2016.

## 2015-07-25 NOTE — Telephone Encounter (Signed)
error 

## 2015-08-01 NOTE — Progress Notes (Signed)
Cardiology Office Note   Date:  08/02/2015   ID:  Hannah Galvan, DOB 04/15/1931, MRN [id number]  PCP:  Hannah Stack, MD  Cardiologist:  Dr. Darlin Galvan   Electrophysiologist:  n/a  Chief Complaint  Patient presents with  . Atrial Fibrillation     History of Present Illness: Hannah Galvan is a 81 y.o. female with a hx of HTN, HL, hypothyroidism, palpitations, breast CA (R in 2000, L in 2007), HL (intol to Crestor), mild AI, mild MR.    Last seen in clinic by me in 8/16.  She complained of rapid palpitations and DOE and CP.  I placed her on Toprol-XL 12.5 mg QD.  Echo, Myoview and Event Monitor was arranged. Myoview was low risk with no ischemia and normal LVF.  Echo demonstrated vigorous LVF, mild diastolic dysfunction and PASP 33 mmHg.  Event monitor demonstrated NSR, PACs and an episode of PAF with CVR.  Dr. Darlin Galvan placed her on Apixaban.  CHADS2-VASc=4.  She returns for FU.  She is here with her daughter in law.  She tells me that she does not feel good.  This is usually in the AM. She stopped Toprol-XL for unclear reasons.  She continues to have palpitations.  It is not clear that her symptoms in the AM are related to AF or PACs.  She has occasional chest pain with positional changes.  She denies significant DOE.  She is likely NYHA 2-2b.  She denies orthopnea, PND.  She denies significant LE edema.  No syncope.  Of note, her husband and son both died in the past 2 years.  She is depressed and usually feels bad when she is under stress.      Studies/Reports Reviewed Today:  Myoview 8/16  Nuclear stress EF: 81%.  There was no ST segment deviation noted during stress.  No T wave inversion was noted during stress.  Defect 1: There is a small defect of mild severity present in the apex location. This is most likely due to breast attenuation artifact with no ischemia noted.  This is a low risk study.  The left ventricular ejection fraction is hyperdynamic  (>65%).  Echo 07/09/15 Vigorous LVF, EF 82-50%, grade 1 diastolic dysfunction, trivial AI, MAC, mild MR, mild RVE, normal RV function, PASP 33 mmHg  Holter 11/2014 NSR, PACs  Echo 06/2012 EF 60-65%, normal wall motion, grade 1 diastolic dysfunction, trivial AI, mild MR, mild SAM, PASP 36 mmHg  Event Monitor 07/2012 NSR, PACs, PVCs  Carotid US 10/12 No significant carotid bifurcation plaque or stenosis.   Past Medical History  Diagnosis Date  . Thyroid disease   . Hypertension   . Hyperlipidemia   . Arthritis   . Cancer     breast, s/p RXT, bilateral  . Hypothyroidism   . Shortness of breath   . Anxiety   . History of stress test     Lexiscan Myoview 8/16:  EF 81%, breast attenuation, no ischemia; Low Risk  . History of echocardiogram     Echo 8/16: Vigorous LVF, EF 53-97%, grade 1 diastolic dysfunction, trivial AI, mild MR, mild RVE, normal RV function, PASP 33 mmHg    Past Surgical History  Procedure Laterality Date  . Abdominal hysterectomy  2008  . Thyroid surgery  2002  . Breast lumpectomy  2000  . Breast lumpectomy  2007  . Cataract extraction  2011  . Rotator cuff repair      right  . Knee surgery  right  . Bladder tact    . Total knee arthroplasty Left 06/02/2013    Dr Rhona Raider  . Total knee arthroplasty Right 06/02/2013    Procedure: RIGHT TOTAL KNEE ARTHROPLASTY;  Surgeon: Hessie Dibble, MD;  Location: [facility];  Service: Orthopedics;  Laterality: Right;  . Total knee arthroplasty Left 08/22/2014    Procedure: TOTAL KNEE ARTHROPLASTY;  Surgeon: Hessie Dibble, MD;  Location: [facility];  Service: Orthopedics;  Laterality: Left;     Current Outpatient Prescriptions  Medication Sig Dispense Refill  . apixaban (ELIQUIS) 5 MG TABS tablet Take 1 tablet (5 mg total) by mouth 2 (two) times daily. 60 tablet 5  . atorvastatin (LIPITOR) 20 MG tablet Take 10 mg by mouth daily.     . Calcium Carbonate-Vitamin D (CALCIUM + D) 600-200 MG-UNIT TABS Take 1 tablet by  mouth 2 (two) times daily.      . clonazePAM (KLONOPIN) 0.5 MG tablet TAKE 1 TABLET EVERY 8 HOURS  1  . clonazePAM (KLONOPIN) 0.5 MG tablet Take 0.5 mg by mouth daily as needed for anxiety.    Marland Kitchen CRANBERRY PO Take 1 tablet by mouth daily. 450 mg daily    . exemestane (AROMASIN) 25 MG tablet Take 25 mg by mouth daily after breakfast.    . folic acid (FOLVITE) 1 MG tablet Take 1 tablet (1 mg total) by mouth daily. 90 tablet 1  . HYDROcodone-acetaminophen (NORCO/VICODIN) 5-325 MG per tablet Take 1-2 tablets by mouth every 6 (six) hours as needed for moderate pain. 50 tablet 0  . levothyroxine (SYNTHROID) 125 MCG tablet Take 1 tablet (125 mcg total) by mouth daily. 90 tablet 1  . LORazepam (ATIVAN) 1 MG tablet Take 1 mg by mouth at bedtime.    Marland Kitchen losartan-hydrochlorothiazide (HYZAAR) 100-25 MG per tablet Take 1 tablet by mouth daily.     . Multiple Vitamins-Minerals (CENTRUM SILVER ADULT 50+ PO) Take by mouth once.    . potassium chloride SA (K-DUR,KLOR-CON) 20 MEQ tablet Take 1 tablet (20 mEq total) by mouth daily. 90 tablet 1  . rosuvastatin (CRESTOR) 20 MG tablet Take 20 mg by mouth daily. Patient taking 1/2 tablet by mouth once daily    . diltiazem (CARDIZEM) 30 MG tablet Take 1 tablet (30 mg total) by mouth as needed (PALPITATIONS). 30 tablet 2   No current facility-administered medications for this visit.    Allergies:   Ambien; Pyridium; and Sulfa antibiotics    Social History:  The patient  reports that she has never smoked. She has never used smokeless tobacco. She reports that she does not drink alcohol or use illicit drugs.   Family History:  The patient's family history includes Cancer in her brother, brother, brother, brother, brother, father, sister, sister, and sister; Heart disease in her sister; Hypertension in her mother; Stroke in her mother.    ROS:   Please see the history of present illness.   Review of Systems  All other systems reviewed and are negative.      PHYSICAL EXAM: VS:  BP 120/62 mmHg  Pulse 79  Ht 5\' 2"  (1.575 m)  Wt 151 lb (68.493 kg)  BMI 27.61 kg/m2    Wt Readings from Last 3 Encounters:  08/02/15 151 lb (68.493 kg)  06/28/15 153 lb 6.4 oz (69.582 kg)  06/18/15 153 lb (69.4 kg)     GEN: Well nourished, well developed, in no acute distress HEENT: normal Neck: no JVD,  no masses Cardiac:  Normal S1/S2, RRR;  short 1/6 systolic murmur RUSB,  no rubs or gallops, trace bilateral ankle edema  Respiratory:  clear to auscultation bilaterally, no wheezing, rhonchi or rales. GI: soft, nontender, nondistended, + BS MS: no deformity or atrophy Skin: warm and dry  Neuro:  CNs II-XII intact, Strength and sensation are intact Psych: Normal affect   EKG:  EKG is ordered today.  It demonstrates:   NSR, HR 79, LAD, QTc 444 ms, no change from prior tracings   Recent Labs: 06/18/2015: ALT(SGPT) 36 06/28/2015: BUN 13; Creatinine, Ser 0.90; Hemoglobin 14.5; Platelets 225.0; Potassium 4.0; Pro B Natriuretic peptide (BNP) 44.0; Sodium 136 07/03/2015: TSH 0.49    Lipid Panel    Component Value Date/Time   CHOL 164 10/04/2012 0840   TRIG 64.0 10/04/2012 0840   HDL 60.50 10/04/2012 0840   CHOLHDL 3 10/04/2012 0840   VLDL 12.8 10/04/2012 0840   LDLCALC 91 10/04/2012 0840      ASSESSMENT AND PLAN:  Paroxysmal Atrial Fibrillation:  She had one episode of PAF on her monitor and HR was controlled.  CHADS2-VASc=4.  She is now on [facility] with Eliquis.  FU with CVRR next month as planned.  Get BMET, CBC at that time.  I am not convinced her symptoms are all related to PAF.  I think she is having a lot of issues with depression.  She stopped Toprol-XL for unclear reasons.    -  Continue Eliquis  -  DC Toprol-XL  -  Start Diltiazem 30 mg QD prn for rapid palps  -  If symptoms more suggestive of symptomatic PAF, refer to AF Clinic for recommendations regarding rhythm control.   Depression:  I have asked her to FU with her PCP for further eval and  mgmt.  Snoring:  We talked about this at her last visit. But, her family now notes that she does not have significant snoring.  I am not convinced she needs sleep testing at this time.   Diastolic Dysfunction:  She has mild diastolic dysfn on her echo. She is already on HCTZ.  BNP at last visit was normal. She does not look volume overloaded.  I think her symptoms of dyspnea are related to anxiety.  I do not think she needs adjustments in her diuretics at this point.   Hypertension: Controlled.  Hyperlipidemia: Continue statin.     Medication Changes: Current medicines are reviewed at length with the patient today.  Concerns regarding medicines are as outlined above.  The following changes have been made:   Discontinued Medications   METOPROLOL SUCCINATE (TOPROL-XL) 25 MG 24 HR TABLET    Take 0.5 tablets (12.5 mg total) by mouth at bedtime.   ONDANSETRON (ZOFRAN-ODT) 8 MG DISINTEGRATING TABLET    Take 8 mg by mouth every 8 (eight) hours as needed for nausea.    Modified Medications   No medications on file   New Prescriptions   DILTIAZEM (CARDIZEM) 30 MG TABLET    Take 1 tablet (30 mg total) by mouth as needed (PALPITATIONS).    Labs/ tests ordered today include:   Orders Placed This Encounter  Procedures  . Basic Metabolic Panel (BMET)  . CBC w/Diff     Disposition:   She does not want to FU with Dr. Darlin Galvan.  FU with CVRR in 10/16 and me in 3 mos.      Signed, Versie Starks, MHS 08/02/2015 4:40 PM    [place], [place]  [address] Phone: [phone]; Fax: [phone]

## 2015-08-02 ENCOUNTER — Encounter: Payer: Self-pay | Admitting: Physician Assistant

## 2015-08-02 ENCOUNTER — Ambulatory Visit (INDEPENDENT_AMBULATORY_CARE_PROVIDER_SITE_OTHER): Payer: Medicare Other | Admitting: Physician Assistant

## 2015-08-02 VITALS — BP 120/62 | HR 79 | Ht 62.0 in | Wt 151.0 lb

## 2015-08-02 DIAGNOSIS — F32A Depression, unspecified: Secondary | ICD-10-CM

## 2015-08-02 DIAGNOSIS — I480 Paroxysmal atrial fibrillation: Secondary | ICD-10-CM

## 2015-08-02 DIAGNOSIS — I519 Heart disease, unspecified: Secondary | ICD-10-CM

## 2015-08-02 DIAGNOSIS — I10 Essential (primary) hypertension: Secondary | ICD-10-CM

## 2015-08-02 DIAGNOSIS — F329 Major depressive disorder, single episode, unspecified: Secondary | ICD-10-CM

## 2015-08-02 DIAGNOSIS — E785 Hyperlipidemia, unspecified: Secondary | ICD-10-CM | POA: Diagnosis not present

## 2015-08-02 DIAGNOSIS — I5189 Other ill-defined heart diseases: Secondary | ICD-10-CM

## 2015-08-02 MED ORDER — DILTIAZEM HCL 30 MG PO TABS: 30.0000 mg | ORAL_TABLET | ORAL | Status: DC | PRN

## 2015-08-02 NOTE — Patient Instructions (Addendum)
Medication Instructions:  Your physician has recommended you make the following change in your medication:  1. Take Diltiazem ( 30 mg ) as needed for palpitations 2. Discontinue Toprol XL  Labwork: Your physician recommends that you return for lab work on October 7 with coumadin appointment   Testing/Procedures: -None  Follow-Up: Your physician recommends that you keep your scheduled follow-up appointment in: 3 months on December 2  with Richardson Dopp, PA-C   Any Other Special Instructions Will Be Listed Below (If Applicable). Follow up with PCP for depression.

## 2015-08-04 ENCOUNTER — Telehealth: Payer: Self-pay | Admitting: Physician Assistant

## 2015-08-04 NOTE — Telephone Encounter (Signed)
     Patient called to report that her nose bled and she is on Eliquis. She only bled for a few minutes and now it is stopped. I reassured the patient and advised her to call back if she continues to bleed or becomes lightheaded or dizzy. She denies any CP or SOB. Feeling well otherwise. She will keep a close eye on the bleeding.  Angelena Form PA-C  [facility]

## 2015-08-09 NOTE — Addendum Note (Signed)
Addended by: Freada Bergeron on: 08/09/2015 05:50 PM   Modules accepted: Orders

## 2015-08-25 ENCOUNTER — Encounter (HOSPITAL_COMMUNITY): Payer: Self-pay | Admitting: Emergency Medicine

## 2015-08-25 ENCOUNTER — Emergency Department (HOSPITAL_COMMUNITY)
Admission: EM | Admit: 2015-08-25 | Discharge: 2015-08-25 | Disposition: A | Discharge status: Home / Self Care | Payer: Medicare Other | Attending: Emergency Medicine | Admitting: Emergency Medicine

## 2015-08-25 ENCOUNTER — Emergency Department (HOSPITAL_COMMUNITY): Payer: Medicare Other

## 2015-08-25 DIAGNOSIS — F419 Anxiety disorder, unspecified: Secondary | ICD-10-CM | POA: Diagnosis not present

## 2015-08-25 DIAGNOSIS — Z79899 Other long term (current) drug therapy: Secondary | ICD-10-CM | POA: Insufficient documentation

## 2015-08-25 DIAGNOSIS — I10 Essential (primary) hypertension: Secondary | ICD-10-CM | POA: Insufficient documentation

## 2015-08-25 DIAGNOSIS — Y9389 Activity, other specified: Secondary | ICD-10-CM | POA: Diagnosis not present

## 2015-08-25 DIAGNOSIS — Y92410 Unspecified street and highway as the place of occurrence of the external cause: Secondary | ICD-10-CM | POA: Insufficient documentation

## 2015-08-25 DIAGNOSIS — S299XXA Unspecified injury of thorax, initial encounter: Secondary | ICD-10-CM | POA: Diagnosis present

## 2015-08-25 DIAGNOSIS — Z7982 Long term (current) use of aspirin: Secondary | ICD-10-CM | POA: Insufficient documentation

## 2015-08-25 DIAGNOSIS — E785 Hyperlipidemia, unspecified: Secondary | ICD-10-CM | POA: Diagnosis not present

## 2015-08-25 DIAGNOSIS — M1990 Unspecified osteoarthritis, unspecified site: Secondary | ICD-10-CM | POA: Insufficient documentation

## 2015-08-25 DIAGNOSIS — Y998 Other external cause status: Secondary | ICD-10-CM | POA: Insufficient documentation

## 2015-08-25 DIAGNOSIS — Z853 Personal history of malignant neoplasm of breast: Secondary | ICD-10-CM | POA: Insufficient documentation

## 2015-08-25 DIAGNOSIS — E039 Hypothyroidism, unspecified: Secondary | ICD-10-CM | POA: Insufficient documentation

## 2015-08-25 DIAGNOSIS — R0789 Other chest pain: Secondary | ICD-10-CM

## 2015-08-25 LAB — BASIC METABOLIC PANEL
Anion gap: 11 (ref 5–15)
BUN: 16 mg/dL (ref 6–20)
CO2: 23 mmol/L (ref 22–32)
Calcium: 8.6 mg/dL — ABNORMAL LOW (ref 8.9–10.3)
Chloride: 93 mmol/L — ABNORMAL LOW (ref 101–111)
Creatinine, Ser: 0.77 mg/dL (ref 0.44–1.00)
GFR calc Af Amer: >60 mL/min (ref >60)
GFR calc non Af Amer: >60 mL/min (ref >60)
Glucose, Bld: 108 mg/dL — ABNORMAL HIGH (ref 65–99)
Potassium: 3.1 mmol/L — ABNORMAL LOW (ref 3.5–5.1)
Sodium: 127 mmol/L — ABNORMAL LOW (ref 135–145)

## 2015-08-25 LAB — CBC WITH DIFFERENTIAL/PLATELET
Basophils Absolute: 0 10*3/uL (ref 0.0–0.1)
Basophils Relative: 0 %
Eosinophils Absolute: 0.1 10*3/uL (ref 0.0–0.7)
Eosinophils Relative: 1 %
HCT: 40.7 % (ref 36.0–46.0)
Hemoglobin: 14.5 g/dL (ref 12.0–15.0)
Lymphocytes Relative: 22 %
Lymphs Abs: 2.8 10*3/uL (ref 0.7–4.0)
MCH: 31.9 pg (ref 26.0–34.0)
MCHC: 35.6 g/dL (ref 30.0–36.0)
MCV: 89.5 fL (ref 78.0–100.0)
Monocytes Absolute: 1.1 10*3/uL — ABNORMAL HIGH (ref 0.1–1.0)
Monocytes Relative: 9 %
Neutro Abs: 8.6 10*3/uL — ABNORMAL HIGH (ref 1.7–7.7)
Neutrophils Relative %: 68 %
Platelets: 258 10*3/uL (ref 150–400)
RBC: 4.55 MIL/uL (ref 3.87–5.11)
RDW: 13.2 % (ref 11.5–15.5)
WBC: 12.5 10*3/uL — ABNORMAL HIGH (ref 4.0–10.5)

## 2015-08-25 LAB — TROPONIN I: Troponin I: <0.03 ng/mL (ref <0.031)
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
## 2015-08-25 NOTE — Discharge Instructions (Signed)

## 2015-08-25 NOTE — ED Notes (Signed)
Lab draw delayed, pt not in room

## 2015-08-25 NOTE — ED Notes (Signed)
Patient transported to X-ray 
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
## 2015-08-27 ENCOUNTER — Telehealth: Payer: Self-pay | Admitting: *Deleted

## 2015-08-27 MED ORDER — METOPROLOL SUCCINATE ER 25 MG PO TB24: 12.5000 mg | ORAL_TABLET | Freq: Every day | ORAL | Status: DC

## 2015-08-27 NOTE — Telephone Encounter (Signed)
-----   Message from Darlin Coco, MD sent at 08/21/2015 12:23 PM EDT ----- Please report.  Further review of her event monitor does not show any atrial fibrillation.  The one episode (asymptomatic) in question was less than 2 minutes and appears to be artefact baseline and frequent PACs. She can stop her Eliquis and return to taking a baby ASA daily.  For her symptomatic palpitations she can start taking Toprol 12.5 mg daily. Recheck for OV and EKG in several weeks.

## 2015-08-27 NOTE — Telephone Encounter (Signed)
Notes Recorded by Earvin Hansen on 08/23/2015 at 4:24 PM Advised patient of results Notes Recorded by Earvin Hansen on 08/23/2015 at 4:19 PM Advised patient of results Notes Recorded by Earvin Hansen on 08/21/2015 at 5:54 PM No answer Notes Recorded by Darlin Coco, MD on 08/21/2015 at 12:23 PM Please report. Further review of her event monitor does not show any atrial fibrillation. The one episode (asymptomatic) in question was less than 2 minutes and appears to be artefact baseline and frequent PACs. She can stop her Eliquis and return to taking a baby ASA daily. For her symptomatic palpitations she can start taking Toprol 12.5 mg daily. Recheck for OV and EKG in several weeks.

## 2015-08-31 ENCOUNTER — Other Ambulatory Visit: Payer: Medicare Other

## 2015-09-07 ENCOUNTER — Ambulatory Visit: Payer: Medicare Other | Admitting: Cardiology

## 2015-10-02 ENCOUNTER — Emergency Department (HOSPITAL_COMMUNITY): Payer: Medicare Other

## 2015-10-02 ENCOUNTER — Emergency Department (HOSPITAL_COMMUNITY)
Admission: EM | Admit: 2015-10-02 | Discharge: 2015-10-03 | Disposition: A | Discharge status: Home / Self Care | Payer: Medicare Other | Attending: Emergency Medicine | Admitting: Emergency Medicine

## 2015-10-02 ENCOUNTER — Encounter (HOSPITAL_COMMUNITY): Payer: Self-pay | Admitting: Emergency Medicine

## 2015-10-02 DIAGNOSIS — E785 Hyperlipidemia, unspecified: Secondary | ICD-10-CM | POA: Insufficient documentation

## 2015-10-02 DIAGNOSIS — Y998 Other external cause status: Secondary | ICD-10-CM | POA: Diagnosis not present

## 2015-10-02 DIAGNOSIS — W1839XA Other fall on same level, initial encounter: Secondary | ICD-10-CM | POA: Insufficient documentation

## 2015-10-02 DIAGNOSIS — Y9389 Activity, other specified: Secondary | ICD-10-CM | POA: Insufficient documentation

## 2015-10-02 DIAGNOSIS — Z7982 Long term (current) use of aspirin: Secondary | ICD-10-CM | POA: Diagnosis not present

## 2015-10-02 DIAGNOSIS — Z79899 Other long term (current) drug therapy: Secondary | ICD-10-CM | POA: Diagnosis not present

## 2015-10-02 DIAGNOSIS — E079 Disorder of thyroid, unspecified: Secondary | ICD-10-CM | POA: Insufficient documentation

## 2015-10-02 DIAGNOSIS — S20211A Contusion of right front wall of thorax, initial encounter: Secondary | ICD-10-CM | POA: Diagnosis not present

## 2015-10-02 DIAGNOSIS — I10 Essential (primary) hypertension: Secondary | ICD-10-CM | POA: Diagnosis not present

## 2015-10-02 DIAGNOSIS — Y9289 Other specified places as the place of occurrence of the external cause: Secondary | ICD-10-CM | POA: Diagnosis not present

## 2015-10-02 DIAGNOSIS — Z853 Personal history of malignant neoplasm of breast: Secondary | ICD-10-CM | POA: Diagnosis not present

## 2015-10-02 DIAGNOSIS — E039 Hypothyroidism, unspecified: Secondary | ICD-10-CM | POA: Insufficient documentation

## 2015-10-02 DIAGNOSIS — R55 Syncope and collapse: Secondary | ICD-10-CM | POA: Insufficient documentation

## 2015-10-02 DIAGNOSIS — W19XXXA Unspecified fall, initial encounter: Secondary | ICD-10-CM

## 2015-10-02 DIAGNOSIS — F419 Anxiety disorder, unspecified: Secondary | ICD-10-CM | POA: Insufficient documentation

## 2015-10-02 DIAGNOSIS — M1990 Unspecified osteoarthritis, unspecified site: Secondary | ICD-10-CM | POA: Insufficient documentation

## 2015-10-02 LAB — CBC WITH DIFFERENTIAL/PLATELET
BASOS ABS: 0 10*3/uL (ref 0.0–0.1)
BASOS PCT: 0 %
EOS ABS: 0 10*3/uL (ref 0.0–0.7)
EOS PCT: 0 %
HEMATOCRIT: 44.1 % (ref 36.0–46.0)
Hemoglobin: 15.4 g/dL — ABNORMAL HIGH (ref 12.0–15.0)
Lymphocytes Relative: 11 %
Lymphs Abs: 1.3 10*3/uL (ref 0.7–4.0)
MCH: 32.1 pg (ref 26.0–34.0)
MCHC: 34.9 g/dL (ref 30.0–36.0)
MCV: 91.9 fL (ref 78.0–100.0)
MONO ABS: 0.8 10*3/uL (ref 0.1–1.0)
Monocytes Relative: 7 %
NEUTROS ABS: 9.5 10*3/uL — AB (ref 1.7–7.7)
Neutrophils Relative %: 82 %
PLATELETS: 238 10*3/uL (ref 150–400)
RBC: 4.8 MIL/uL (ref 3.87–5.11)
RDW: 13.2 % (ref 11.5–15.5)
WBC: 11.6 10*3/uL — ABNORMAL HIGH (ref 4.0–10.5)

## 2015-10-02 LAB — BASIC METABOLIC PANEL
ANION GAP: 12 (ref 5–15)
BUN: 12 mg/dL (ref 6–20)
CALCIUM: 9.3 mg/dL (ref 8.9–10.3)
CO2: 25 mmol/L (ref 22–32)
CREATININE: 0.82 mg/dL (ref 0.44–1.00)
Chloride: 98 mmol/L — ABNORMAL LOW (ref 101–111)
Glucose, Bld: 104 mg/dL — ABNORMAL HIGH (ref 65–99)
Potassium: 3.2 mmol/L — ABNORMAL LOW (ref 3.5–5.1)
SODIUM: 135 mmol/L (ref 135–145)

## 2015-10-02 LAB — URINALYSIS, ROUTINE W REFLEX MICROSCOPIC
BILIRUBIN URINE: NEGATIVE
Glucose, UA: NEGATIVE mg/dL
HGB URINE DIPSTICK: NEGATIVE
KETONES UR: 15 mg/dL — AB
Leukocytes, UA: NEGATIVE
NITRITE: NEGATIVE
Protein, ur: NEGATIVE mg/dL
Specific Gravity, Urine: 1.011 (ref 1.005–1.030)
UROBILINOGEN UA: 0.2 mg/dL (ref 0.0–1.0)
pH: 8 (ref 5.0–8.0)

## 2015-10-02 LAB — I-STAT TROPONIN, ED: TROPONIN I, POC: 0 ng/mL (ref 0.00–0.08)

## 2015-10-02 MED ORDER — POTASSIUM CHLORIDE CRYS ER 20 MEQ PO TBCR
40.0000 meq | EXTENDED_RELEASE_TABLET | Freq: Once | ORAL | Status: AC
  Administered 2015-10-03: 40 meq via ORAL
  Filled 2015-10-02: qty 2

## 2015-10-02 NOTE — ED Notes (Signed)
Pt here via EMS with c/o fall with unknown down time. PT was incontinent of urine upon arrival. Pt is a/o x 4 and has no complaints. She does not have any memory of the fall or when she fell. Denies blood thinners.

## 2015-10-02 NOTE — ED Provider Notes (Signed)
CSN: [id number]     Arrival date & time 10/02/15  2007 History   First MD Initiated Contact with Patient 10/02/15 2007     Chief Complaint  Patient presents with  . Fall     (Consider location/radiation/quality/duration/timing/severity/associated sxs/prior Treatment) HPI Comments: Patient is an 81 year old female with a past medical history of hypertension, hypothyroidism, and arthritis who presents after an unwitnessed fall that occurred prior to arrival. Patient's granddaughter went to visit her this evening and found her on the floor in the hallway. Patient reports she fell but does not remember how she fell. She is unsure of any preceeding symptoms. She denies any pain at this time. She has no other complaints. No aggravating/alleviating factors. Patient's family reports she has fallen about 3 times in the past few months and they attribute this to her mental health. She has experienced some depression over the past year since losing her husband and her son.       Past Medical History  Diagnosis Date  . Thyroid disease   . Hypertension   . Hyperlipidemia   . Arthritis   . Cancer ([place])     breast, s/p RXT, bilateral  . Hypothyroidism   . Shortness of breath   . Anxiety   . History of stress test     Lexiscan Myoview 8/16:  EF 81%, breast attenuation, no ischemia; Low Risk  . History of echocardiogram     Echo 8/16: Vigorous LVF, EF 09-73%, grade 1 diastolic dysfunction, trivial AI, mild MR, mild RVE, normal RV function, PASP 33 mmHg   Past Surgical History  Procedure Laterality Date  . Abdominal hysterectomy  2008  . Thyroid surgery  2002  . Breast lumpectomy  2000  . Breast lumpectomy  2007  . Cataract extraction  2011  . Rotator cuff repair      right  . Knee surgery      right  . Bladder tact    . Total knee arthroplasty Left 06/02/2013    Dr Rhona Raider  . Total knee arthroplasty Right 06/02/2013    Procedure: RIGHT TOTAL KNEE ARTHROPLASTY;  Surgeon: Hessie Dibble,  MD;  Location: [facility];  Service: Orthopedics;  Laterality: Right;  . Total knee arthroplasty Left 08/22/2014    Procedure: TOTAL KNEE ARTHROPLASTY;  Surgeon: Hessie Dibble, MD;  Location: [facility];  Service: Orthopedics;  Laterality: Left;  . Joint replacement     Family History  Problem Relation Age of Onset  . Hypertension Mother   . Stroke Mother   . Cancer Father   . Cancer Sister     leukemia  . Cancer Brother     leukemia  . Cancer Sister     vaginal  . Cancer Sister     bladder  . Cancer Brother     prostate, skin  . Cancer Brother     colon  . Cancer Brother     prostate, bone mets  . Cancer Brother     skin  . Heart disease Sister    Social History  Substance Use Topics  . Smoking status: Never Smoker   . Smokeless tobacco: Never Used     Comment: never used tobacco  . Alcohol Use: No   OB History    Gravida Para Term Preterm AB TAB SAB Ectopic Multiple Living   2 2             Review of Systems  Musculoskeletal: Positive for arthralgias.  Neurological:  Positive for syncope.  All other systems reviewed and are negative.     Allergies  Ambien; Pyridium; and Sulfa antibiotics  Home Medications   Prior to Admission medications   Medication Sig Start Date End Date Taking? Authorizing Provider  aspirin 81 MG tablet Take 81 mg by mouth daily.   Yes Historical Provider, MD  Calcium Carbonate-Vitamin D (CALCIUM + D) 600-200 MG-UNIT TABS Take 1 tablet by mouth 2 (two) times daily.     Yes Historical Provider, MD  cholecalciferol (VITAMIN D) 1000 UNITS tablet Take 1,000 Units by mouth daily.   Yes Historical Provider, MD  Cranberry 250 MG CAPS Take 250 mg by mouth daily.   Yes Historical Provider, MD  Cyanocobalamin (VITAMIN B-12) 5000 MCG TBDP Take 5,000 mcg by mouth daily.   Yes Historical Provider, MD  escitalopram (LEXAPRO) 20 MG tablet Take 20 mg by mouth daily. 09/26/15  Yes Historical Provider, MD  exemestane (AROMASIN) 25 MG tablet Take 25 mg by mouth  daily after breakfast. 04/06/13  Yes Volanda Napoleon, MD  folic acid (FOLVITE) 1 MG tablet Take 1 tablet (1 mg total) by mouth daily. 10/28/12  Yes Lanice Shirts, MD  levothyroxine (SYNTHROID) 125 MCG tablet Take 1 tablet (125 mcg total) by mouth daily. 10/28/12  Yes Lanice Shirts, MD  LORazepam (ATIVAN) 1 MG tablet Take 0.5-1 mg by mouth daily as needed for anxiety or sleep.    Yes Historical Provider, MD  losartan-hydrochlorothiazide (HYZAAR) 100-25 MG per tablet Take 0.5 tablets by mouth daily.    Yes Historical Provider, MD  Multiple Vitamins-Minerals (CENTRUM SILVER ADULT 50+ PO) Take 1 tablet by mouth daily.    Yes Historical Provider, MD  potassium chloride SA (K-DUR,KLOR-CON) 20 MEQ tablet Take 1 tablet (20 mEq total) by mouth daily. 03/25/13  Yes Darlin Coco, MD  rosuvastatin (CRESTOR) 10 MG tablet Take 10 mg by mouth daily.   Yes Historical Provider, MD  vitamin C (ASCORBIC ACID) 500 MG tablet Take 500 mg by mouth daily.   Yes Historical Provider, MD  ALPRAZolam Duanne Moron) 0.5 MG tablet Take 0.5 mg by mouth daily. 09/20/15   Historical Provider, MD  buPROPion (WELLBUTRIN SR) 100 MG 12 hr tablet Take 100 mg by mouth every evening. 09/20/15   Historical Provider, MD  diltiazem (CARDIZEM) 30 MG tablet Take 30 mg by mouth daily as needed. 08/02/15   Historical Provider, MD  HYDROcodone-acetaminophen (NORCO/VICODIN) 5-325 MG per tablet Take 1-2 tablets by mouth every 6 (six) hours as needed for moderate pain. Patient not taking: Reported on 10/02/2015 08/25/14   Loni Dolly, PA-C  metaxalone (SKELAXIN) 800 MG tablet Take 800 mg by mouth daily as needed. 09/26/15   Historical Provider, MD  metoprolol succinate (TOPROL XL) 25 MG 24 hr tablet Take 0.5 tablets (12.5 mg total) by mouth daily. 08/27/15   Darlin Coco, MD   BP 151/84 mmHg  Pulse 85  Temp(Src) 98.8 F (37.1 C) (Oral)  Resp 20  Ht 5\' 3"  (1.6 m)  Wt 150 lb (68.04 kg)  BMI 26.58 kg/m2  SpO2 94% Physical Exam   Constitutional: She is oriented to person, place, and time. She appears well-developed and well-nourished. No distress.  HENT:  Head: Normocephalic and atraumatic.  Mouth/Throat: Oropharynx is clear and moist. No oropharyngeal exudate.  Eyes: Conjunctivae and EOM are normal. Pupils are equal, round, and reactive to light.  Neck: Normal range of motion.  Cardiovascular: Normal rate and regular rhythm.  Exam reveals no gallop and no  friction rub.   No murmur heard. Pulmonary/Chest: Effort normal and breath sounds normal. She has no wheezes. She has no rales. She exhibits tenderness.  4x4cm bruise noted of the right upper chest with mild tenderness to palpation.   Abdominal: Soft. She exhibits no distension. There is no tenderness. There is no rebound.  Musculoskeletal: Normal range of motion.  No midline spine tenderness to palpation. No obvious joint deformity. Right hip tenderness to palpation.   Neurological: She is alert and oriented to person, place, and time. No cranial nerve deficit. Coordination normal.  Speech is goal-oriented. Moves limbs without ataxia.   Skin: Skin is warm and dry.  No open wounds.   Psychiatric: She has a normal mood and affect. Her behavior is normal.  Nursing note and vitals reviewed.   ED Course  Procedures (including critical care time) Labs Review Labs Reviewed  CBC WITH DIFFERENTIAL/PLATELET - Abnormal; Notable for the following:    WBC 11.6 (*)    Hemoglobin 15.4 (*)    Neutro Abs 9.5 (*)    All other components within normal limits  BASIC METABOLIC PANEL - Abnormal; Notable for the following:    Potassium 3.2 (*)    Chloride 98 (*)    Glucose, Bld 104 (*)    All other components within normal limits  URINALYSIS, ROUTINE W REFLEX MICROSCOPIC (NOT AT [facility]) - Abnormal; Notable for the following:    APPearance CLOUDY (*)    Ketones, ur 15 (*)    All other components within normal limits  Randolm Idol, ED    Imaging Review Dg Chest 2  View  10/02/2015  CLINICAL DATA:  Fall with right shoulder pain.  Initial encounter. EXAM: CHEST  2 VIEW COMPARISON:  08/25/2015 FINDINGS: Limited AP image due to rotation. Normal heart size and grossly stable aortic and hilar contours. Small hiatal hernia containing gas. There is no edema, consolidation, effusion, or pneumothorax. No acute osseous finding. Changes of right axillary dissection. IMPRESSION: No evidence of active cardiopulmonary disease. Electronically Signed   By: Monte Fantasia M.D.   On: 10/02/2015 21:47   Dg Shoulder Right  10/02/2015  CLINICAL DATA:  [place].  Right shoulder pain. EXAM: RIGHT SHOULDER - 2+ VIEW COMPARISON:  Right shoulder MRI 02/05/2011. FINDINGS: The joint spaces are maintained. The humeral head is riding high in the glenoid fossae likely due to rotator cuff disease. No definite right-sided rib fractures. The visualize right lung is clear. IMPRESSION: Mild glenohumeral and AC joint degenerative changes but no acute bony findings. The humeral head is riding high in the glenoid fossa, likely due to rotator cuff disease. Electronically Signed   By: Marijo Sanes M.D.   On: 10/02/2015 21:49   Ct Head Wo Contrast  10/02/2015  CLINICAL DATA:  Unwitnessed fall. Initial encounter. EXAM: CT HEAD WITHOUT CONTRAST CT CERVICAL SPINE WITHOUT CONTRAST TECHNIQUE: Multidetector CT imaging of the head and cervical spine was performed following the standard protocol without intravenous contrast. Multiplanar CT image reconstructions of the cervical spine were also generated. COMPARISON:  Head CT 04/14/2014 FINDINGS: CT HEAD FINDINGS Skull and Sinuses:Negative for fracture or destructive process. The mastoids, middle ears, and imaged paranasal sinuses are clear. Postsurgical appearing deficiency of the bilateral nasolacrimal canal. Orbits: Bilateral cataract resection.  No traumatic finding. Brain: No evidence of acute infarction, hemorrhage, hydrocephalus, or mass lesion/mass effect. Normal  cerebral volume and white matter for age. CT CERVICAL SPINE FINDINGS No evidence of cervical spine fracture or traumatic malalignment. No retropharyngeal edema or  gross cervical canal hematoma. Diffuse degenerative disc disease with narrowing and endplate ridging greatest from C4-5 to C6-7. Multilevel facet arthropathy. Bulky ligamentous overgrowth behind the dens with ossification and mild chronic appearing erosive changes in the dens. IMPRESSION: No evidence of intracranial or cervical spine injury. Electronically Signed   By: Monte Fantasia M.D.   On: 10/02/2015 22:19   Ct Cervical Spine Wo Contrast  10/02/2015  CLINICAL DATA:  Unwitnessed fall. Initial encounter. EXAM: CT HEAD WITHOUT CONTRAST CT CERVICAL SPINE WITHOUT CONTRAST TECHNIQUE: Multidetector CT imaging of the head and cervical spine was performed following the standard protocol without intravenous contrast. Multiplanar CT image reconstructions of the cervical spine were also generated. COMPARISON:  Head CT 04/14/2014 FINDINGS: CT HEAD FINDINGS Skull and Sinuses:Negative for fracture or destructive process. The mastoids, middle ears, and imaged paranasal sinuses are clear. Postsurgical appearing deficiency of the bilateral nasolacrimal canal. Orbits: Bilateral cataract resection.  No traumatic finding. Brain: No evidence of acute infarction, hemorrhage, hydrocephalus, or mass lesion/mass effect. Normal cerebral volume and white matter for age. CT CERVICAL SPINE FINDINGS No evidence of cervical spine fracture or traumatic malalignment. No retropharyngeal edema or gross cervical canal hematoma. Diffuse degenerative disc disease with narrowing and endplate ridging greatest from C4-5 to C6-7. Multilevel facet arthropathy. Bulky ligamentous overgrowth behind the dens with ossification and mild chronic appearing erosive changes in the dens. IMPRESSION: No evidence of intracranial or cervical spine injury. Electronically Signed   By: Monte Fantasia M.D.    On: 10/02/2015 22:19   Dg Hips Bilat With Pelvis 3-4 Views  10/02/2015  CLINICAL DATA:  Fall with bruise on the right thigh. Initial encounter. EXAM: DG HIP (WITH OR WITHOUT PELVIS) 3-4V BILAT COMPARISON:  None. FINDINGS: Subcutaneous reticulation asymmetrically about the right lateral hip. There is no evidence of acute fracture or dislocation. Osteopenic appearance of the bones. Lower lumbar degenerative disc disease. No significant hip arthropathy. IMPRESSION: Fat reticulation/contusion about the right hip. No acute osseous finding. Electronically Signed   By: Monte Fantasia M.D.   On: 10/02/2015 21:49   I have personally reviewed and evaluated these images and lab results as part of my medical decision-making.   EKG Interpretation None      MDM   Final diagnoses:  Fall, initial encounter    9:38 PM Labs and imaging pending. Vitals stable and patient afebrile. Patient declines pain medication at this time. No neurologic deficits.   Patient's labs show mild increase in WBC which is likely non specific. Patient's imaging unremarkable for acute changes. Vitals stable and patient afebrile. Patient able to ambulate. Dr. Billy Fischer saw the patient and the family has agreed to take the patient home and watch her. Patient will follow up with PCP.    Alvina Chou, PA-C 10/03/15 1610  Gareth Morgan, MD 10/03/15 [phone]

## 2015-10-03 NOTE — ED Notes (Signed)
Pt stable, ambulatory, states understanding of discharge instructions 

## 2015-10-04 ENCOUNTER — Encounter: Payer: Self-pay | Admitting: Physician Assistant

## 2015-10-04 ENCOUNTER — Ambulatory Visit (INDEPENDENT_AMBULATORY_CARE_PROVIDER_SITE_OTHER): Payer: Medicare Other | Admitting: Physician Assistant

## 2015-10-04 VITALS — BP 142/84 | HR 80 | Temp 97.6°F | Resp 18 | Wt 155.0 lb

## 2015-10-04 DIAGNOSIS — W19XXXD Unspecified fall, subsequent encounter: Secondary | ICD-10-CM

## 2015-10-04 DIAGNOSIS — Z09 Encounter for follow-up examination after completed treatment for conditions other than malignant neoplasm: Secondary | ICD-10-CM

## 2015-10-04 NOTE — Progress Notes (Signed)
Patient ID: Hannah Galvan MRN: [id number], DOB: 08/09/1931, 81 y.o. Date of Encounter: @DATE @  Chief Complaint:  Chief Complaint  Patient presents with  . new pt  ED follow up s/p fall    HPI: 81 y.o. year old white female  presents with her Daughter-in-law.   She is here to follow-up recent ER visit. I have reviewed the ER records. 10/02/15. ER note states that "she presents after an unwitnessed fall that occurred prior to arrival. Patient's  granddaughter went to visit her this evening and found her on the floor in the hallway. Patient reports she fell but does not remember how she fell. He is unsure if any preceding symptoms. Denies any pain at this time." Physical exam at that time was notable for a 4 cm x 4 cm bruise on the right upper chest with mild tenderness to palpation. Right hip tenderness with palpation. No open wounds. No other significant findings on physical exam. Chest x-ray showed no evidence of active cardiopulmonary disease. No acute osseous findings. CT head showed no traumatic finding. No hemorrhage. CT cervical spine showed no evidence of intracranial or cervical spine injury. Diffuse degenerative disc disease. X-ray of hips bilaterally with pelvis: No evidence of acute fracture or dislocation. No acute osseous finding.  ER note states the patient declines pain medication at this time. Was felt to be stable for discharge home and follow-up with PCP.  They present for follow-up. She has a visit scheduled with Dr. Dennard Schaumann next week to establish as a new patient with [place]. Her husband and her son who have both passed away were patients of his in the past.  Daughter-in-law states that she was wondering whether some of the medications the patient takes for her anxiety may be contributing to these falls. No other concerns or questions today. Daughter-in-law states that with this fall--- when daughter-in-law entered the house she did see that the stool that is usually in  the bathroom was just outside of the bathroom in the hall. She does not know whether the patient had been in the bathroom and her foot got hung up on the stool and that's what caused her to fall. Does not know whether patient had gone to get the stool to do something and then fell. Patient has no recollection of these events and is unable to provide any history/information. Daughter-in-law states that it had been a long time since patient had had any other falls. Has not been having frequent falls. They also state the patient is supposed to be leaving tomorrow to go to [place] to stay with her sister and her sister's family.   Past Medical History  Diagnosis Date  . Thyroid disease   . Hypertension   . Hyperlipidemia   . Arthritis   . Cancer ([place])     breast, s/p RXT, bilateral  . Hypothyroidism   . Shortness of breath   . Anxiety   . History of stress test     Lexiscan Myoview 8/16:  EF 81%, breast attenuation, no ischemia; Low Risk  . History of echocardiogram     Echo 8/16: Vigorous LVF, EF [id number], grade 1 diastolic dysfunction, trivial AI, mild MR, mild RVE, normal RV function, PASP 33 mmHg     Home Meds: Outpatient Prescriptions Prior to Visit  Medication Sig Dispense Refill  . ALPRAZolam (XANAX) 0.5 MG tablet Take 0.5 mg by mouth daily.  1  . aspirin 81 MG tablet Take 81 mg by mouth daily.    Marland Kitchen  buPROPion (WELLBUTRIN SR) 100 MG 12 hr tablet Take 100 mg by mouth every evening.  1  . Calcium Carbonate-Vitamin D (CALCIUM + D) 600-200 MG-UNIT TABS Take 1 tablet by mouth 2 (two) times daily.      . cholecalciferol (VITAMIN D) 1000 UNITS tablet Take 1,000 Units by mouth daily.    . Cranberry 250 MG CAPS Take 250 mg by mouth daily.    Marland Kitchen escitalopram (LEXAPRO) 20 MG tablet Take 20 mg by mouth daily.  3  . exemestane (AROMASIN) 25 MG tablet Take 25 mg by mouth daily after breakfast.    . folic acid (FOLVITE) 1 MG tablet Take 1 tablet (1 mg total) by mouth daily. 90 tablet 1  .  HYDROcodone-acetaminophen (NORCO/VICODIN) 5-325 MG per tablet Take 1-2 tablets by mouth every 6 (six) hours as needed for moderate pain. 50 tablet 0  . levothyroxine (SYNTHROID) 125 MCG tablet Take 1 tablet (125 mcg total) by mouth daily. 90 tablet 1  . LORazepam (ATIVAN) 1 MG tablet Take 0.5 mg by mouth at bedtime.     Marland Kitchen losartan-hydrochlorothiazide (HYZAAR) 100-25 MG per tablet Take 0.5 tablets by mouth daily.     . metaxalone (SKELAXIN) 800 MG tablet Take 800 mg by mouth daily as needed for muscle spasms.   0  . Multiple Vitamins-Minerals (CENTRUM SILVER ADULT 50+ PO) Take 1 tablet by mouth daily.     . potassium chloride SA (K-DUR,KLOR-CON) 20 MEQ tablet Take 1 tablet (20 mEq total) by mouth daily. 90 tablet 1  . rosuvastatin (CRESTOR) 20 MG tablet Take 20 mg by mouth daily.  2  . Cyanocobalamin (VITAMIN B-12) 5000 MCG TBDP Take 5,000 mcg by mouth daily.    Marland Kitchen diltiazem (CARDIZEM) 30 MG tablet Take 30 mg by mouth daily as needed (palpitations).   2  . metoprolol succinate (TOPROL XL) 25 MG 24 hr tablet Take 0.5 tablets (12.5 mg total) by mouth daily. (Patient not taking: Reported on 10/02/2015) 15 tablet 5  . vitamin C (ASCORBIC ACID) 500 MG tablet Take 500 mg by mouth daily.     No facility-administered medications prior to visit.    Allergies:  Allergies  Allergen Reactions  . Ambien [Zolpidem]     confusion  . Pyridium [Phenazopyridine Hcl] Other (See Comments)    hemolysis  . Sulfa Antibiotics Rash    Social History   Social History  . Marital Status: Widowed    Spouse Name: N/A  . Number of Children: N/A  . Years of Education: N/A   Occupational History  . Not on file.   Social History Main Topics  . Smoking status: Never Smoker   . Smokeless tobacco: Never Used     Comment: never used tobacco  . Alcohol Use: No  . Drug Use: No  . Sexual Activity: Not Currently   Other Topics Concern  . Not on file   Social History Narrative    Family History  Problem  Relation Age of Onset  . Hypertension Mother   . Stroke Mother   . Cancer Father   . Cancer Sister     leukemia  . Cancer Brother     leukemia  . Cancer Sister     vaginal  . Cancer Sister     bladder  . Cancer Brother     prostate, skin  . Cancer Brother     colon  . Cancer Brother     prostate, bone mets  . Cancer Brother  skin  . Heart disease Sister      Review of Systems:  See HPI for pertinent ROS. All other ROS negative.    Physical Exam: Blood pressure 142/84, pulse 80, temperature 97.6 F (36.4 C), temperature source Oral, resp. rate 18, weight 155 lb (70.308 kg)., Body mass index is 27.46 kg/(m^2). General: WNWD WF. Appears in no acute distress. Neck: Supple. No thyromegaly. No lymphadenopathy. Lungs: Clear bilaterally to auscultation without wheezes, rales, or rhonchi. Breathing is unlabored. Heart: RRR with S1 S2. No murmurs, rubs, or gallops. Musculoskeletal:  Strength and tone normal for age. Extremities/Skin: Warm and dry. Neuro: Alert and oriented X 3. Moves all extremities spontaneously. Gait is normal. CNII-XII grossly in tact. Psych:  Responds to questions appropriately with a normal affect.     ASSESSMENT AND PLAN:  81 y.o. year old female with  1. Encounter for examination following treatment at hospital ER records including x-rays and CT scans performed during ER evaluation were reviewed.  2. Fall, subsequent encounter Daughter-in-law states the patient has not had a fall in a long time until this one. Patient was alone at the time of the fall and she does not recall what caused the fall.  Given that she is leaving to go to [place] tomorrow discussed with the daughter-in-law that I do not feel that we should adjust medications at this time. Patient does state that she is more sore today than she was yesterday so we discussed pain management. Daughter-in-law states that she still has hydrocodone remaining from where she had a knee replacement.  Also daughter-in-law states that patient does very well with this medicine and it does not cause any adverse effects and does not increase her fall risk. Therefore encourage patient to use this for pain control.   Signed, [address] [place], [place], [place] 10/04/2015 11:25 AM

## 2015-10-16 ENCOUNTER — Encounter: Payer: Self-pay | Admitting: Family Medicine

## 2015-10-25 NOTE — Progress Notes (Signed)
Cardiology Office Note   Date:  10/25/2015   ID:  Hannah Galvan, DOB 1931/08/27, MRN [id number]  Patient Care Team: Susy Frizzle, MD as PCP - General (Family Medicine) Hannah Coco, MD as Consulting Physician (Cardiology) Hannah Shi, PA-C as Physician Assistant (Cardiology)    Chief Complaint  Patient presents with  . Follow-up  . Atrial Fibrillation     History of Present Illness: Hannah Galvan is a 81 y.o. female with a hx of HTN, HL, hypothyroidism, palpitations, breast CA (R in 2000, L in 2007), HL (intol to Crestor), mild AI, mild MR.    She was seen in 8/16 with complaints of rapid palpitations and DOE and CP.  I placed her on Toprol-XL 12.5 mg QD.  Echo, Myoview and Event Monitor was arranged. Myoview was low risk with no ischemia and normal LVF.  Echo demonstrated vigorous LVF, mild diastolic dysfunction and PASP 33 mmHg.  Event monitor demonstrated NSR, PACs and an episode of possible PAF with CVR.  Dr. Darlin Galvan placed her on Apixaban as her CHADS2-VASc=4.  Last seen 9/16. She had stopped Toprol. I placed her on diltiazem 30 mg daily when necessary. Since that time, her event monitor was further reviewed by Dr. Mare Ferrari. The episode in question that demonstrated atrial fibrillation was felt to be artifact and frequent PACs. It was recommended that she stop Eliquis and return to taking baby aspirin daily. Toprol-XL was recommended to take to help with palpitations.    She returns for follow-up.    Studies/Reports Reviewed Today:  Event monitor 8/16 NSR with frequent PACs No atrial fibrillation  Myoview 8/16 EF 81%, apical defect most likely consistent with breast attenuation, no ischemia, low risk  Echo 07/09/15 Vigorous LVF, EF Q000111Q, grade 1 diastolic dysfunction, trivial AI, MAC, mild MR, mild RVE, normal RV function, PASP 33 mmHg  Holter 11/2014 NSR, PACs  Echo 06/2012 EF 60-65%, normal wall motion, grade 1 diastolic dysfunction, trivial  AI, mild MR, mild SAM, PASP 36 mmHg  Event Monitor 07/2012 NSR, PACs, PVCs  Carotid US 10/12 No significant carotid bifurcation plaque or stenosis.   Past Medical History  Diagnosis Date  . Thyroid disease   . Hypertension   . Hyperlipidemia   . Arthritis   . Cancer ([place])     breast, s/p RXT, bilateral  . Hypothyroidism   . Shortness of breath   . Anxiety   . History of stress test     Lexiscan Myoview 8/16:  EF 81%, breast attenuation, no ischemia; Low Risk  . History of echocardiogram     Echo 8/16: Vigorous LVF, EF Q000111Q, grade 1 diastolic dysfunction, trivial AI, mild MR, mild RVE, normal RV function, PASP 33 mmHg  . Allergy   . Blood transfusion without reported diagnosis   . Cataract     Past Surgical History  Procedure Laterality Date  . Abdominal hysterectomy  2008  . Thyroid surgery  2002  . Breast lumpectomy  2000  . Breast lumpectomy  2007  . Cataract extraction  2011  . Rotator cuff repair      right  . Knee surgery      right  . Bladder tact    . Total knee arthroplasty Left 06/02/2013    Dr Hannah Galvan  . Total knee arthroplasty Right 06/02/2013    Procedure: RIGHT TOTAL KNEE ARTHROPLASTY;  Surgeon: Hannah Dibble, MD;  Location: [facility];  Service: Orthopedics;  Laterality: Right;  . Total knee arthroplasty  Left 08/22/2014    Procedure: TOTAL KNEE ARTHROPLASTY;  Surgeon: Hannah Dibble, MD;  Location: [facility];  Service: Orthopedics;  Laterality: Left;  . Joint replacement       Current Outpatient Prescriptions  Medication Sig Dispense Refill  . ALPRAZolam (XANAX) 0.5 MG tablet Take 0.5 mg by mouth daily.  1  . aspirin 81 MG tablet Take 81 mg by mouth daily.    Marland Kitchen buPROPion (WELLBUTRIN SR) 100 MG 12 hr tablet Take 100 mg by mouth every evening.  1  . Calcium Carbonate-Vitamin D (CALCIUM + D) 600-200 MG-UNIT TABS Take 1 tablet by mouth 2 (two) times daily.      . cholecalciferol (VITAMIN D) 1000 UNITS tablet Take 1,000 Units by mouth daily.    .  Cranberry 250 MG CAPS Take 250 mg by mouth daily.    . Cyanocobalamin (VITAMIN B-12) 5000 MCG TBDP Take 5,000 mcg by mouth daily.    Marland Kitchen diltiazem (CARDIZEM) 30 MG tablet Take 30 mg by mouth daily as needed (palpitations).   2  . escitalopram (LEXAPRO) 20 MG tablet Take 20 mg by mouth daily.  3  . exemestane (AROMASIN) 25 MG tablet Take 25 mg by mouth daily after breakfast.    . folic acid (FOLVITE) 1 MG tablet Take 1 tablet (1 mg total) by mouth daily. 90 tablet 1  . HYDROcodone-acetaminophen (NORCO/VICODIN) 5-325 MG per tablet Take 1-2 tablets by mouth every 6 (six) hours as needed for moderate pain. 50 tablet 0  . levothyroxine (SYNTHROID) 125 MCG tablet Take 1 tablet (125 mcg total) by mouth daily. 90 tablet 1  . LORazepam (ATIVAN) 1 MG tablet Take 0.5 mg by mouth at bedtime.     Marland Kitchen losartan-hydrochlorothiazide (HYZAAR) 100-25 MG per tablet Take 0.5 tablets by mouth daily.     . metaxalone (SKELAXIN) 800 MG tablet Take 800 mg by mouth daily as needed for muscle spasms.   0  . metoprolol succinate (TOPROL XL) 25 MG 24 hr tablet Take 0.5 tablets (12.5 mg total) by mouth daily. 15 tablet 5  . Multiple Vitamins-Minerals (CENTRUM SILVER ADULT 50+ PO) Take 1 tablet by mouth daily.     . potassium chloride SA (K-DUR,KLOR-CON) 20 MEQ tablet Take 1 tablet (20 mEq total) by mouth daily. 90 tablet 1  . rosuvastatin (CRESTOR) 20 MG tablet Take 20 mg by mouth daily.  2  . vitamin C (ASCORBIC ACID) 500 MG tablet Take 500 mg by mouth daily.     No current facility-administered medications for this visit.    Allergies:   Ambien; Pyridium; and Sulfa antibiotics    Social History:  The patient  reports that she has never smoked. She has never used smokeless tobacco. She reports that she does not drink alcohol or use illicit drugs.   Family History:  The patient's family history includes Cancer in her brother, brother, brother, brother, brother, father, sister, sister, and sister; Heart disease in her sister;  Hypertension in her mother; Stroke in her mother.    ROS:   Please see the history of present illness.   Review of Systems  All other systems reviewed and are negative.     PHYSICAL EXAM: VS:  There were no vitals taken for this visit.    Wt Readings from Last 3 Encounters:  10/04/15 155 lb (70.308 kg)  10/02/15 150 lb (68.04 kg)  08/02/15 151 lb (68.493 kg)     GEN: Well nourished, well developed, in no acute distress HEENT: normal  Neck: no JVD,  no masses Cardiac:  Normal S1/S2, RRR; short 1/6 systolic murmur RUSB,  no rubs or gallops, trace bilateral ankle edema  Respiratory:  clear to auscultation bilaterally, no wheezing, rhonchi or rales. GI: soft, nontender, nondistended, + BS MS: no deformity or atrophy Skin: warm and dry  Neuro:  CNs II-XII intact, Strength and sensation are intact Psych: Normal affect   EKG:  EKG is ordered today.  It demonstrates:      Recent Labs: 06/18/2015: ALT(SGPT) 36 06/28/2015: Pro B Natriuretic peptide (BNP) 44.0 07/03/2015: TSH 0.49 10/02/2015: BUN 12; Creatinine, Ser 0.82; Hemoglobin 15.4*; Platelets 238; Potassium 3.2*; Sodium 135    Lipid Panel    Component Value Date/Time   CHOL 164 10/04/2012 0840   TRIG 64.0 10/04/2012 0840   HDL 60.50 10/04/2012 0840   CHOLHDL 3 10/04/2012 0840   VLDL 12.8 10/04/2012 0840   LDLCALC 91 10/04/2012 0840      ASSESSMENT AND PLAN:  1. Palpitations:  Event monitor was initially thought to document one episode of PAF.  She was placed on Apixaban b/c her CHADS2-VASc=4.  At last visit I put her on prn Diltiazem b/c she had stopped the Toprol.  Dr. Mare Ferrari reviewed her final event monitor report after her last visit and it was felt that her rhythm was NSR with PACs and the episode in question was not AFib but significant artifact with PACs. Therefore Apixaban was stopped.      2. Depression:    3. Diastolic Dysfunction:  She has mild diastolic dysfn on her echo. She is already on HCTZ.  BNP was  previously normal.   4. Hypertension: Controlled.  5. Hyperlipidemia: Continue statin.     Medication Changes: Current medicines are reviewed at length with the patient today.  Concerns regarding medicines are as outlined above.  The following changes have been made:   Discontinued Medications   No medications on file   Modified Medications   No medications on file   New Prescriptions   No medications on file    Labs/ tests ordered today include:   No orders of the defined types were placed in this encounter.     Disposition:   She does not want to FU with Dr. Darlin Galvan.  FU with CVRR in 10/16 and me in 3 mos.      Signed, Versie Starks, MHS 10/25/2015 10:57 PM    [facility] [place], [place], [place]  [address] Phone: [phone]; Fax: [phone]    This encounter was created in error - please disregard.

## 2015-10-26 ENCOUNTER — Telehealth: Payer: Self-pay | Admitting: Family Medicine

## 2015-10-26 ENCOUNTER — Encounter: Payer: Medicare Other | Admitting: Physician Assistant

## 2015-10-26 DIAGNOSIS — R0989 Other specified symptoms and signs involving the circulatory and respiratory systems: Secondary | ICD-10-CM

## 2015-10-26 NOTE — Telephone Encounter (Signed)
Dr. Dennard Schaumann,  Pt's son, [name] called to give you an update on her. He states that she has been staying with her niece who is an Therapist, sports in [place] over the past 6 weeks. Her niece has since taken her off of Oxycodone, Celexa, Metaxalone and Xanax. She is back on Ativan and Wellbuterin. He says that she is doing "better than ever". She will be coming back home in two weeks.  If you have any questions or concerns, please call [name] @ [phone]

## 2015-10-29 NOTE — Telephone Encounter (Signed)
Send to Dr. Dennard Schaumann

## 2015-10-29 NOTE — Telephone Encounter (Signed)
I have not seen this patient before but I am glad to hear she is doing better.  I will await her visit.

## 2015-10-31 ENCOUNTER — Encounter: Payer: Self-pay | Admitting: Physician Assistant

## 2015-11-01 ENCOUNTER — Ambulatory Visit: Payer: Medicare Other | Admitting: Family Medicine

## 2015-11-14 ENCOUNTER — Ambulatory Visit: Payer: Medicare Other | Admitting: Physician Assistant

## 2015-11-17 ENCOUNTER — Emergency Department (HOSPITAL_COMMUNITY): Payer: Medicare Other

## 2015-11-17 ENCOUNTER — Encounter (HOSPITAL_COMMUNITY): Payer: Self-pay

## 2015-11-17 ENCOUNTER — Observation Stay (HOSPITAL_COMMUNITY)
Admission: EM | Admit: 2015-11-17 | Discharge: 2015-11-19 | Disposition: A | Discharge status: Home / Self Care | Payer: Medicare Other | Attending: Cardiovascular Disease | Admitting: Cardiovascular Disease

## 2015-11-17 DIAGNOSIS — I447 Left bundle-branch block, unspecified: Secondary | ICD-10-CM | POA: Diagnosis present

## 2015-11-17 DIAGNOSIS — Z7982 Long term (current) use of aspirin: Secondary | ICD-10-CM | POA: Insufficient documentation

## 2015-11-17 DIAGNOSIS — Z96653 Presence of artificial knee joint, bilateral: Secondary | ICD-10-CM | POA: Insufficient documentation

## 2015-11-17 DIAGNOSIS — E039 Hypothyroidism, unspecified: Secondary | ICD-10-CM | POA: Insufficient documentation

## 2015-11-17 DIAGNOSIS — Z853 Personal history of malignant neoplasm of breast: Secondary | ICD-10-CM | POA: Insufficient documentation

## 2015-11-17 DIAGNOSIS — E876 Hypokalemia: Secondary | ICD-10-CM | POA: Diagnosis not present

## 2015-11-17 DIAGNOSIS — E7800 Pure hypercholesterolemia, unspecified: Secondary | ICD-10-CM | POA: Insufficient documentation

## 2015-11-17 DIAGNOSIS — E785 Hyperlipidemia, unspecified: Secondary | ICD-10-CM | POA: Diagnosis not present

## 2015-11-17 DIAGNOSIS — I129 Hypertensive chronic kidney disease with stage 1 through stage 4 chronic kidney disease, or unspecified chronic kidney disease: Secondary | ICD-10-CM | POA: Insufficient documentation

## 2015-11-17 DIAGNOSIS — N189 Chronic kidney disease, unspecified: Secondary | ICD-10-CM | POA: Insufficient documentation

## 2015-11-17 DIAGNOSIS — I10 Essential (primary) hypertension: Secondary | ICD-10-CM | POA: Diagnosis not present

## 2015-11-17 DIAGNOSIS — R079 Chest pain, unspecified: Secondary | ICD-10-CM | POA: Diagnosis present

## 2015-11-17 DIAGNOSIS — K449 Diaphragmatic hernia without obstruction or gangrene: Secondary | ICD-10-CM | POA: Diagnosis not present

## 2015-11-17 DIAGNOSIS — R002 Palpitations: Secondary | ICD-10-CM | POA: Insufficient documentation

## 2015-11-17 DIAGNOSIS — F419 Anxiety disorder, unspecified: Secondary | ICD-10-CM | POA: Diagnosis not present

## 2015-11-17 DIAGNOSIS — Z79899 Other long term (current) drug therapy: Secondary | ICD-10-CM | POA: Insufficient documentation

## 2015-11-17 DIAGNOSIS — R0789 Other chest pain: Secondary | ICD-10-CM | POA: Diagnosis not present

## 2015-11-17 LAB — PROTIME-INR
INR: 1.2 (ref 0.00–1.49)
Prothrombin Time: 15.4 seconds — ABNORMAL HIGH (ref 11.6–15.2)

## 2015-11-17 LAB — COMPREHENSIVE METABOLIC PANEL
ALK PHOS: 48 U/L (ref 38–126)
ALT: 28 U/L (ref 14–54)
AST: 35 U/L (ref 15–41)
Albumin: 3.9 g/dL (ref 3.5–5.0)
Anion gap: 10 (ref 5–15)
BUN: 10 mg/dL (ref 6–20)
CALCIUM: 9.8 mg/dL (ref 8.9–10.3)
CHLORIDE: 95 mmol/L — AB (ref 101–111)
CO2: 27 mmol/L (ref 22–32)
CREATININE: 0.82 mg/dL (ref 0.44–1.00)
GFR calc Af Amer: >60 mL/min (ref >60)
GFR calc non Af Amer: >60 mL/min (ref >60)
GLUCOSE: 104 mg/dL — AB (ref 65–99)
Potassium: 3.9 mmol/L (ref 3.5–5.1)
SODIUM: 132 mmol/L — AB (ref 135–145)
Total Bilirubin: 0.8 mg/dL (ref 0.3–1.2)
Total Protein: 6.5 g/dL (ref 6.5–8.1)

## 2015-11-17 LAB — APTT: aPTT: 26 seconds (ref 24–37)

## 2015-11-17 LAB — I-STAT TROPONIN, ED: Troponin i, poc: 0.01 ng/mL (ref 0.00–0.08)

## 2015-11-17 LAB — CBC
HCT: 43.3 % (ref 36.0–46.0)
HEMOGLOBIN: 14.7 g/dL (ref 12.0–15.0)
MCH: 31.7 pg (ref 26.0–34.0)
MCHC: 33.9 g/dL (ref 30.0–36.0)
MCV: 93.3 fL (ref 78.0–100.0)
PLATELETS: 209 10*3/uL (ref 150–400)
RBC: 4.64 MIL/uL (ref 3.87–5.11)
RDW: 13.2 % (ref 11.5–15.5)
WBC: 6.3 10*3/uL (ref 4.0–10.5)

## 2015-11-17 LAB — MRSA PCR SCREENING: MRSA BY PCR: NEGATIVE

## 2015-11-17 LAB — HEPARIN LEVEL (UNFRACTIONATED)

## 2015-11-17 LAB — TROPONIN I
Troponin I: <0.03 ng/mL (ref <0.031)
Troponin I: <0.03 ng/mL (ref <0.031)

## 2015-11-17 MED ORDER — CRANBERRY 250 MG PO CAPS: 250.0000 mg | ORAL_CAPSULE | Freq: Every day | ORAL | Status: DC

## 2015-11-17 MED ORDER — ASPIRIN EC 81 MG PO TBEC
81.0000 mg | DELAYED_RELEASE_TABLET | Freq: Every day | ORAL | Status: DC
  Administered 2015-11-18 – 2015-11-19 (×2): 81 mg via ORAL
  Filled 2015-11-17 (×2): qty 1

## 2015-11-17 MED ORDER — LEVOTHYROXINE SODIUM 25 MCG PO TABS
125.0000 ug | ORAL_TABLET | Freq: Every day | ORAL | Status: DC
  Administered 2015-11-18 – 2015-11-19 (×2): 125 ug via ORAL
  Filled 2015-11-17 (×3): qty 1

## 2015-11-17 MED ORDER — DILTIAZEM HCL 30 MG PO TABS: 30.0000 mg | ORAL_TABLET | Freq: Every day | ORAL | Status: DC | PRN

## 2015-11-17 MED ORDER — LOSARTAN POTASSIUM 50 MG PO TABS
100.0000 mg | ORAL_TABLET | Freq: Every day | ORAL | Status: DC
  Administered 2015-11-18 – 2015-11-19 (×2): 100 mg via ORAL
  Filled 2015-11-17 (×2): qty 2

## 2015-11-17 MED ORDER — ACETAMINOPHEN 325 MG PO TABS
650.0000 mg | ORAL_TABLET | ORAL | Status: DC | PRN
  Administered 2015-11-17: 650 mg via ORAL
  Filled 2015-11-17: qty 2

## 2015-11-17 MED ORDER — HEPARIN (PORCINE) IN NACL 100-0.45 UNIT/ML-% IJ SOLN
800.0000 [IU]/h | INTRAMUSCULAR | Status: DC
  Filled 2015-11-17: qty 250

## 2015-11-17 MED ORDER — VITAMIN B-12 5000 MCG PO TBDP: 5000.0000 ug | ORAL_TABLET | Freq: Every day | ORAL | Status: DC

## 2015-11-17 MED ORDER — VITAMIN C 500 MG PO TABS: 500.0000 mg | ORAL_TABLET | Freq: Every day | ORAL | Status: DC

## 2015-11-17 MED ORDER — ASPIRIN 81 MG PO CHEW
324.0000 mg | CHEWABLE_TABLET | Freq: Once | ORAL | Status: AC
  Administered 2015-11-17: 243 mg via ORAL
  Filled 2015-11-17: qty 4

## 2015-11-17 MED ORDER — ONDANSETRON HCL 4 MG/2ML IJ SOLN
4.0000 mg | Freq: Four times a day (QID) | INTRAMUSCULAR | Status: DC | PRN
  Administered 2015-11-17: 4 mg via INTRAVENOUS
  Filled 2015-11-17: qty 2

## 2015-11-17 MED ORDER — LORAZEPAM 0.5 MG PO TABS
0.5000 mg | ORAL_TABLET | Freq: Every day | ORAL | Status: DC
  Administered 2015-11-17 – 2015-11-18 (×2): 0.5 mg via ORAL
  Filled 2015-11-17 (×2): qty 1

## 2015-11-17 MED ORDER — LOSARTAN POTASSIUM-HCTZ 100-25 MG PO TABS: 0.5000 | ORAL_TABLET | Freq: Every day | ORAL | Status: DC

## 2015-11-17 MED ORDER — POTASSIUM CHLORIDE CRYS ER 20 MEQ PO TBCR
20.0000 meq | EXTENDED_RELEASE_TABLET | Freq: Every day | ORAL | Status: DC
  Administered 2015-11-18 – 2015-11-19 (×2): 20 meq via ORAL
  Filled 2015-11-17 (×2): qty 1

## 2015-11-17 MED ORDER — BUPROPION HCL ER (SR) 100 MG PO TB12
100.0000 mg | ORAL_TABLET | Freq: Every evening | ORAL | Status: DC
Start: 2015-11-17 — End: 2015-11-19
  Administered 2015-11-18: 100 mg via ORAL
  Filled 2015-11-17 (×3): qty 1

## 2015-11-17 MED ORDER — ESCITALOPRAM OXALATE 10 MG PO TABS
20.0000 mg | ORAL_TABLET | Freq: Every day | ORAL | Status: DC
  Filled 2015-11-17: qty 2

## 2015-11-17 MED ORDER — HEPARIN (PORCINE) IN NACL 100-0.45 UNIT/ML-% IJ SOLN
1000.0000 [IU]/h | INTRAMUSCULAR | Status: DC
  Administered 2015-11-17: 800 [IU]/h via INTRAVENOUS
  Filled 2015-11-17: qty 250

## 2015-11-17 MED ORDER — FOLIC ACID 1 MG PO TABS
1.0000 mg | ORAL_TABLET | Freq: Every day | ORAL | Status: DC
  Administered 2015-11-18 – 2015-11-19 (×2): 1 mg via ORAL
  Filled 2015-11-17 (×2): qty 1

## 2015-11-17 MED ORDER — SODIUM CHLORIDE 0.9 % IV SOLN
INTRAVENOUS | Status: DC
  Administered 2015-11-17: 10:00:00 via INTRAVENOUS

## 2015-11-17 MED ORDER — METAXALONE 800 MG PO TABS
800.0000 mg | ORAL_TABLET | Freq: Every day | ORAL | Status: DC | PRN
  Filled 2015-11-17: qty 1

## 2015-11-17 MED ORDER — VITAMIN D 1000 UNITS PO TABS
1000.0000 [IU] | ORAL_TABLET | Freq: Every day | ORAL | Status: DC
Start: 2015-11-17 — End: 2015-11-19
  Administered 2015-11-18 – 2015-11-19 (×2): 1000 [IU] via ORAL
  Filled 2015-11-17 (×2): qty 1

## 2015-11-17 MED ORDER — HEPARIN SODIUM (PORCINE) 5000 UNIT/ML IJ SOLN
60.0000 [IU]/kg | INTRAMUSCULAR | Status: AC
  Administered 2015-11-17: 4000 [IU] via INTRAVENOUS

## 2015-11-17 MED ORDER — ROSUVASTATIN CALCIUM 10 MG PO TABS
10.0000 mg | ORAL_TABLET | Freq: Every day | ORAL | Status: DC
  Administered 2015-11-18: 10 mg via ORAL
  Filled 2015-11-17 (×2): qty 1

## 2015-11-17 MED ORDER — ALPRAZOLAM 0.5 MG PO TABS
0.5000 mg | ORAL_TABLET | Freq: Every day | ORAL | Status: DC
  Administered 2015-11-17 – 2015-11-19 (×3): 0.5 mg via ORAL
  Filled 2015-11-17 (×3): qty 1

## 2015-11-17 MED ORDER — NITROGLYCERIN 0.4 MG SL SUBL
0.4000 mg | SUBLINGUAL_TABLET | SUBLINGUAL | Status: DC | PRN
  Filled 2015-11-17: qty 1

## 2015-11-17 MED ORDER — HYDROCHLOROTHIAZIDE 25 MG PO TABS
25.0000 mg | ORAL_TABLET | Freq: Every day | ORAL | Status: DC
  Administered 2015-11-18 – 2015-11-19 (×2): 25 mg via ORAL
  Filled 2015-11-17 (×2): qty 1

## 2015-11-17 MED ORDER — HEPARIN BOLUS VIA INFUSION
4000.0000 [IU] | Freq: Once | INTRAVENOUS | Status: AC
  Administered 2015-11-17: 4000 [IU] via INTRAVENOUS
  Filled 2015-11-17: qty 4000

## 2015-11-17 MED ORDER — NITROGLYCERIN IN D5W 200-5 MCG/ML-% IV SOLN
0.0000 ug/min | INTRAVENOUS | Status: DC
  Administered 2015-11-17: 5 ug/min via INTRAVENOUS
  Filled 2015-11-17: qty 250

## 2015-11-17 NOTE — ED Notes (Signed)
Melissa, RN to take patient upstairs

## 2015-11-17 NOTE — H&P (Signed)
Patient ID: CHRISTASIA TRENCH MRN: [id number], DOB/AGE: June 12, 1931   Admit date: 11/17/2015   Primary Physician: [name], MD Primary Cardiologist: previously Dr. Mare Ferrari, plan to followup with Richardson Dopp until a primary cardiologist can be established later  Pt. Profile:  81 year old female with past medical history of hypertension, hyperlipidemia intolerant to Crestor, hypothyroidism, palpitation, history of breast cancer (R in 2000, L in 2007) and mild AI/MR presented with chest pain.   Problem List  Past Medical History  Diagnosis Date  . Thyroid disease   . Hypertension   . Hyperlipidemia   . Arthritis   . Cancer ([place])     breast, s/p RXT, bilateral  . Hypothyroidism   . Shortness of breath   . Anxiety   . History of stress test     Lexiscan Myoview 8/16:  EF 81%, breast attenuation, no ischemia; Low Risk  . History of echocardiogram     Echo 8/16: Vigorous LVF, EF Q000111Q, grade 1 diastolic dysfunction, trivial AI, mild MR, mild RVE, normal RV function, PASP 33 mmHg  . Allergy   . Blood transfusion without reported diagnosis   . Cataract     Past Surgical History  Procedure Laterality Date  . Abdominal hysterectomy  2008  . Thyroid surgery  2002  . Breast lumpectomy  2000  . Breast lumpectomy  2007  . Cataract extraction  2011  . Rotator cuff repair      right  . Knee surgery      right  . Bladder tact    . Total knee arthroplasty Left 06/02/2013    Dr Rhona Raider  . Total knee arthroplasty Right 06/02/2013    Procedure: RIGHT TOTAL KNEE ARTHROPLASTY;  Surgeon: Hessie Dibble, MD;  Location: [facility];  Service: Orthopedics;  Laterality: Right;  . Total knee arthroplasty Left 08/22/2014    Procedure: TOTAL KNEE ARTHROPLASTY;  Surgeon: Hessie Dibble, MD;  Location: [facility];  Service: Orthopedics;  Laterality: Left;  . Joint replacement       Allergies  Allergies  Allergen Reactions  . Ambien [Zolpidem]     confusion  . Pyridium [Phenazopyridine  Hcl] Other (See Comments)    hemolysis  . Sulfa Antibiotics Rash    HPI  The patient is a 81 year old female with past medical history of hypertension, hyperlipidemia intolerant to Crestor, hypothyroidism, palpitation, history of breast cancer (R in 2000, L in 2007) and mild AI/MR. She seen in August at which time she complained of rapid palpitation and dyspnea with exertion and chest pain. She was placed on Toprol-XL 12.5 mg daily. Echocardiogram was obtained on 07/09/2015 which showed EF Q000111Q, grade 1 diastolic dysfunction, PA peak pressure 33. Nuclear stress test was performed on 8/9 which showed EF 81%, small defect of mild severity present in the apex location most likely consistent with breast attenuation artifact, no ischemia was noted, overall was considered low risk study. Event monitor demonstrated normal sinus rhythm, PAC, an episode of atrial fibrillation with CVR. She was placed on Eliquis given elevated CHA2DS2-Vasc score. Her last office visit was on 08/02/2015, per office note, her Toprol-XL was discontinued and she was started on when necessary diltiazem for rapid palpitation. It was not convincing that her symptom was all related to PAF. Later, further review of her event monitor did not reveal atrial fibrillation, that one episode in question was less than 2 minutes and appears to be artifact baseline was frequent PACs, she was instructed to stop her Eliquis and  resume ASA.   She presented to the ED on 11/17/2015 with chest pain that woke her up around 4-5 AM this morning. A code STEMI was called because patient appears to have new LBBB on EKG. By the time she was seen by cardiology service, her chest pain has largely resolved. She has a lateral lower extremity swelling today, which she was not aware before. However she denies any significant shortness of breath, fever, chill, or cough. She denies any recent paroxysmal nocturnal dyspnea or orthopnea.   Home Medications  Prior to  Admission medications   Medication Sig Start Date End Date Taking? Authorizing Provider  ALPRAZolam Duanne Moron) 0.5 MG tablet Take 0.5 mg by mouth daily. 09/20/15   Historical Provider, MD  aspirin 81 MG tablet Take 81 mg by mouth daily.    Historical Provider, MD  buPROPion (WELLBUTRIN SR) 100 MG 12 hr tablet Take 100 mg by mouth every evening. 09/20/15   Historical Provider, MD  Calcium Carbonate-Vitamin D (CALCIUM + D) 600-200 MG-UNIT TABS Take 1 tablet by mouth 2 (two) times daily.      Historical Provider, MD  cholecalciferol (VITAMIN D) 1000 UNITS tablet Take 1,000 Units by mouth daily.    Historical Provider, MD  Cranberry 250 MG CAPS Take 250 mg by mouth daily.    Historical Provider, MD  Cyanocobalamin (VITAMIN B-12) 5000 MCG TBDP Take 5,000 mcg by mouth daily.    Historical Provider, MD  diltiazem (CARDIZEM) 30 MG tablet Take 30 mg by mouth daily as needed (palpitations).  08/02/15   Historical Provider, MD  escitalopram (LEXAPRO) 20 MG tablet Take 20 mg by mouth daily. 09/26/15   Historical Provider, MD  exemestane (AROMASIN) 25 MG tablet Take 25 mg by mouth daily after breakfast. 04/06/13   Volanda Napoleon, MD  folic acid (FOLVITE) 1 MG tablet Take 1 tablet (1 mg total) by mouth daily. 10/28/12   Lanice Shirts, MD  HYDROcodone-acetaminophen (NORCO/VICODIN) 5-325 MG per tablet Take 1-2 tablets by mouth every 6 (six) hours as needed for moderate pain. 08/25/14   Loni Dolly, PA-C  levothyroxine (SYNTHROID) 125 MCG tablet Take 1 tablet (125 mcg total) by mouth daily. 10/28/12   Lanice Shirts, MD  LORazepam (ATIVAN) 1 MG tablet Take 0.5 mg by mouth at bedtime.     Historical Provider, MD  losartan-hydrochlorothiazide (HYZAAR) 100-25 MG per tablet Take 0.5 tablets by mouth daily.     Historical Provider, MD  metaxalone (SKELAXIN) 800 MG tablet Take 800 mg by mouth daily as needed for muscle spasms.  09/26/15   Historical Provider, MD  metoprolol succinate (TOPROL XL) 25 MG 24 hr tablet  Take 0.5 tablets (12.5 mg total) by mouth daily. 08/27/15   Darlin Coco, MD  Multiple Vitamins-Minerals (CENTRUM SILVER ADULT 50+ PO) Take 1 tablet by mouth daily.     Historical Provider, MD  potassium chloride SA (K-DUR,KLOR-CON) 20 MEQ tablet Take 1 tablet (20 mEq total) by mouth daily. 03/25/13   Darlin Coco, MD  rosuvastatin (CRESTOR) 20 MG tablet Take 20 mg by mouth daily. 08/01/15   Historical Provider, MD  vitamin C (ASCORBIC ACID) 500 MG tablet Take 500 mg by mouth daily.    Historical Provider, MD    Family History  Family History  Problem Relation Age of Onset  . Hypertension Mother   . Stroke Mother   . Cancer Father   . Cancer Sister     leukemia  . Cancer Brother     leukemia  .  Cancer Sister     vaginal  . Cancer Sister     bladder  . Cancer Brother     prostate, skin  . Cancer Brother     colon  . Cancer Brother     prostate, bone mets  . Cancer Brother     skin  . Heart disease Sister     Social History  Social History   Social History  . Marital Status: Widowed    Spouse Name: N/A  . Number of Children: N/A  . Years of Education: N/A   Occupational History  . Not on file.   Social History Main Topics  . Smoking status: Never Smoker   . Smokeless tobacco: Never Used     Comment: never used tobacco  . Alcohol Use: No  . Drug Use: No  . Sexual Activity: Not Currently   Other Topics Concern  . Not on file   Social History Narrative     Review of Systems General:  No chills, fever, night sweats or weight changes.  Cardiovascular:  No dyspnea on exertion, edema, orthopnea, palpitations, paroxysmal nocturnal dyspnea. +chest pain Dermatological: No rash, lesions/masses Respiratory: No cough, dyspnea Urologic: No hematuria, dysuria Abdominal:   No nausea, vomiting, diarrhea, bright red blood per rectum, melena, or hematemesis Neurologic:  No visual changes, wkns, changes in mental status. All other systems reviewed and are otherwise  negative except as noted above.  Physical Exam  Blood pressure 171/81, pulse 87, resp. rate 24, height 5\' 3"  (1.6 m), weight 150 lb (68.04 kg), SpO2 98 %.  General: Pleasant, NAD Psych: Normal affect. Neuro: Alert and oriented X 3. Moves all extremities spontaneously. HEENT: Normal  Neck: Supple without bruits or JVD. Lungs:  Resp regular and unlabored, CTA. Heart: RRR no s3, s4, or murmurs. Abdomen: Soft, non-tender, non-distended, BS + x 4.  Extremities: No clubbing, cyanosis or edema. DP/PT/Radials 2+ and equal bilaterally.  Labs  Troponin (Point of Care Test)  Recent Labs  11/17/15 1012  TROPIPOC 0.01   No results for input(s): CKTOTAL, CKMB, TROPONINI in the last 72 hours. Lab Results  Component Value Date   WBC 6.3 11/17/2015   HGB 14.7 11/17/2015   HCT 43.3 11/17/2015   MCV 93.3 11/17/2015   PLT 209 11/17/2015   No results for input(s): NA, K, CL, CO2, BUN, CREATININE, CALCIUM, PROT, BILITOT, ALKPHOS, ALT, AST, GLUCOSE in the last 168 hours.  Invalid input(s): LABALBU Lab Results  Component Value Date   CHOL 164 10/04/2012   HDL 60.50 10/04/2012   LDLCALC 91 10/04/2012   TRIG 64.0 10/04/2012   Lab Results  Component Value Date   DDIMER 1.82* 12/06/2014     Radiology/Studies  No results found.  ECG  Normal sinus rhythm with left bundle branch block, left bundle branch block is new when compare to the previous EKG.  Echocardiogram 07/09/2015  LV EF: 65% -  70%  ------------------------------------------------------------------- Indications:   786.05 Dyspnea.  ------------------------------------------------------------------- History:  PMH: Hyperlipidemia, Hx of breast Cancer. Murmur. Risk factors: Hypertension.  ------------------------------------------------------------------- Study Conclusions  - Left ventricle: The cavity size was normal. Wall thickness was normal. Systolic function was vigorous. The estimated  ejection fraction was in the range of 65% to 70%. Doppler parameters are consistent with abnormal left ventricular relaxation (grade 1 diastolic dysfunction). - Aortic valve: There was no stenosis. There was trivial regurgitation. - Mitral valve: Mildly calcified annulus. There was mild regurgitation. - Right ventricle: The cavity size was mildly dilated. Systolic  function was normal. - Tricuspid valve: Peak RV-RA gradient (S): 30 mm Hg. - Pulmonary arteries: PA peak pressure: 33 mm Hg (S). - Inferior vena cava: The vessel was normal in size. The respirophasic diameter changes were in the normal range (= 50%), consistent with normal central venous pressure.  Impressions:  - Normal LV size and vigorous systolic function, EF Q000111Q. Normal RV size and systolic function. Mild MR.    ASSESSMENT AND PLAN  1. Chest pain, STEMI canceled after EKG reviewed by Dr. Gwenlyn Found, as she is no longer symptomatic. She does have new onset of left bundle branch block. Initial troponin negative. Will admit patient to ICU for observation, trend troponin.  2. Hypertension  3. hyperlipidemia intolerant to Crestor  4. Hypothyroidism  5. Palpitation: placed on outpatient monitor in August, initially thought to have A. fib episode, later proven to be false/artifact. Was only on eliquis for short period time, currently on aspirin. 6. history of breast cancer (R in 2000, L in 2007)  7. mild AI/MR   Signed, Almyra Deforest, PA-C 11/17/2015, 10:26 AM  Agree with note by Almyra Deforest PA-C  Pt with new onset CP that awakened her this AM. + CRF. Recent neg MV and nl 2D. New LBBB. Exam benign. Labs pending. Currently pain free. Pt does not meet STEMI criteria. Will admit to [facility] stepdown. IV hep/NTG. Cycle enzymes. Pt is very anxious and under a lot of stress according to her Son.   Lorretta Harp, M.D., [place], [facility], [place] [employer] [address] [place],   [address]  [phone] 11/17/2015 10:56 AM

## 2015-11-17 NOTE — ED Notes (Signed)
Xray at the bedside.

## 2015-11-17 NOTE — Progress Notes (Signed)
ANTICOAGULATION CONSULT NOTE - Initial Consult  Pharmacy Consult for Heparin  Indication: chest pain/ACS  Allergies  Allergen Reactions  . Ambien [Zolpidem]     confusion  . Pyridium [Phenazopyridine Hcl] Other (See Comments)    hemolysis  . Sulfa Antibiotics Rash    Patient Measurements: Height: 5\' 3"  (160 cm) Weight: 150 lb (68.04 kg) IBW/kg (Calculated) : 52.4   Vital Signs: Temp: 98 F (36.7 C) (12/24 1305) BP: 147/83 mmHg (12/24 1305) Pulse Rate: 90 (12/24 1305)  Labs:  Recent Labs  11/17/15 1004  HGB 14.7  HCT 43.3  PLT 209  APTT 26  LABPROT 15.4*  INR 1.20  CREATININE 0.82    Estimated Creatinine Clearance: 48.9 mL/min (by C-G formula based on Cr of 0.82).   Medical History: Past Medical History  Diagnosis Date  . Thyroid disease   . Hypertension   . Hyperlipidemia   . Arthritis   . Cancer ([place])     breast, s/p RXT, bilateral  . Hypothyroidism   . Shortness of breath   . Anxiety   . History of stress test     Lexiscan Myoview 8/16:  EF 81%, breast attenuation, no ischemia; Low Risk  . History of echocardiogram     Echo 8/16: Vigorous LVF, EF Q000111Q, grade 1 diastolic dysfunction, trivial AI, mild MR, mild RVE, normal RV function, PASP 33 mmHg  . Allergy   . Blood transfusion without reported diagnosis   . Cataract      Assessment: 84yof admitted with CP this am resolving in ER, new LBBB but no other ECG changes and neg stress test 8/16.  Originally call STEMI but cancelled.  Initial Troponin neg will cycle enzymes, BL labs ok INR 1.2, aPTT 25 sec, CBC stable, renal function stable.   Begin IV heparin  Goal of Therapy:  Heparin level 0.3-0.7 units/ml Monitor platelets by anticoagulation protocol: Yes   Plan:  Heparin bolus 4000 uts IV x1 Heparin drip 800 uts/hr HL in 6hr from start Daily HL, CBC  Bonnita Nasuti Pharm.D. CPP, BCPS Clinical Pharmacist [phone] 11/17/2015 1:58 PM

## 2015-11-17 NOTE — Progress Notes (Addendum)
ANTICOAGULATION CONSULT NOTE - [place] for Heparin  Indication: chest pain/ACS  Allergies  Allergen Reactions  . Ambien [Zolpidem]     confusion  . Pyridium [Phenazopyridine Hcl] Other (See Comments)    hemolysis  . Sulfa Antibiotics Rash    Patient Measurements: Height: 5\' 3"  (160 cm) Weight: 150 lb (68.04 kg) IBW/kg (Calculated) : 52.4   Vital Signs: Temp: 98 F (36.7 C) (12/24 1600) Temp Source: Oral (12/24 1600) BP: 124/72 mmHg (12/24 1800) Pulse Rate: 75 (12/24 1800)  Labs:  Recent Labs  11/17/15 1004 11/17/15 1400 11/17/15 1824  HGB 14.7  --   --   HCT 43.3  --   --   PLT 209  --   --   APTT 26  --   --   LABPROT 15.4*  --   --   INR 1.20  --   --   HEPARINUNFRC  --   --  <0.10*  CREATININE 0.82  --   --   TROPONINI  --  <0.03  --     Estimated Creatinine Clearance: 48.9 mL/min (by C-G formula based on Cr of 0.82).   Medical History: Past Medical History  Diagnosis Date  . Thyroid disease   . Hypertension   . Hyperlipidemia   . Arthritis   . Cancer ([place])     breast, s/p RXT, bilateral  . Hypothyroidism   . Shortness of breath   . Anxiety   . History of stress test     Lexiscan Myoview 8/16:  EF 81%, breast attenuation, no ischemia; Low Risk  . History of echocardiogram     Echo 8/16: Vigorous LVF, EF Q000111Q, grade 1 diastolic dysfunction, trivial AI, mild MR, mild RVE, normal RV function, PASP 33 mmHg  . Allergy   . Blood transfusion without reported diagnosis   . Cataract      Assessment: 81yo F admitted with CP this AM resolving in ER, new LBBB but no other ECG changes and neg stress test 8/16. Originally called CODE STEMI but cancelled.  Initial Troponin neg, will cycle, but no other values yet.  BL labs ok INR 1.2, aPTT 25 sec, CBC stable, renal function stable.    Pt received 4000 unit bolus x1, then was initiated on 800 units/hr. Initial heparin level was undetectable. Nurse states no issues with  infusion and it is actively running.  Goal of Therapy:  Heparin level 0.3-0.7 units/ml Monitor platelets by anticoagulation protocol: Yes   Plan:  Will re-bolus with 4000 units x1 Increase Heparin drip to 1000 units/hr Repeat HL in 8h Daily HL, CBC  Governor Specking, PharmD Clinical Pharmacy Resident Pager: [phone]  11/17/2015 6:57 PM

## 2015-11-17 NOTE — ED Notes (Signed)
Pt anxious. Encouraged to calm down. Pt's family at the bedside. Pt comfortable. Pt remains on the monitor.

## 2015-11-17 NOTE — ED Provider Notes (Signed)
CSN: [id number]     Arrival date & time 11/17/15  [id number] History   First MD Initiated Contact with Patient 11/17/15 [phone]     Chief Complaint  Patient presents with  . Chest Pain     (Consider location/radiation/quality/duration/timing/severity/associated sxs/prior Treatment) Patient is a 81 y.o. female presenting with chest pain. The history is provided by the patient.  Chest Pain Pain location:  Substernal area and L chest Pain quality: pressure   Pain radiates to:  Does not radiate Pain radiates to the back: no   Pain severity:  Moderate Onset quality:  Gradual Duration:  2 hours Timing:  Intermittent Progression:  Waxing and waning Chronicity:  New Context: at rest   Relieved by:  Nothing Worsened by:  Nothing tried Ineffective treatments:  None tried Associated symptoms: no cough, no diaphoresis, no fever and no shortness of breath   Risk factors: high cholesterol and hypertension     Past Medical History  Diagnosis Date  . Thyroid disease   . Hypertension   . Hyperlipidemia   . Arthritis   . Cancer ([place])     breast, s/p RXT, bilateral  . Hypothyroidism   . Shortness of breath   . Anxiety   . History of stress test     Lexiscan Myoview 8/16:  EF 81%, breast attenuation, no ischemia; Low Risk  . History of echocardiogram     Echo 8/16: Vigorous LVF, EF Q000111Q, grade 1 diastolic dysfunction, trivial AI, mild MR, mild RVE, normal RV function, PASP 33 mmHg  . Allergy   . Blood transfusion without reported diagnosis   . Cataract    Past Surgical History  Procedure Laterality Date  . Abdominal hysterectomy  2008  . Thyroid surgery  2002  . Breast lumpectomy  2000  . Breast lumpectomy  2007  . Cataract extraction  2011  . Rotator cuff repair      right  . Knee surgery      right  . Bladder tact    . Total knee arthroplasty Left 06/02/2013    Dr Rhona Raider  . Total knee arthroplasty Right 06/02/2013    Procedure: RIGHT TOTAL KNEE ARTHROPLASTY;  Surgeon: Hessie Dibble, MD;  Location: [facility];  Service: Orthopedics;  Laterality: Right;  . Total knee arthroplasty Left 08/22/2014    Procedure: TOTAL KNEE ARTHROPLASTY;  Surgeon: Hessie Dibble, MD;  Location: [facility];  Service: Orthopedics;  Laterality: Left;  . Joint replacement     Family History  Problem Relation Age of Onset  . Hypertension Mother   . Stroke Mother   . Cancer Father   . Cancer Sister     leukemia  . Cancer Brother     leukemia  . Cancer Sister     vaginal  . Cancer Sister     bladder  . Cancer Brother     prostate, skin  . Cancer Brother     colon  . Cancer Brother     prostate, bone mets  . Cancer Brother     skin  . Heart disease Sister    Social History  Substance Use Topics  . Smoking status: Never Smoker   . Smokeless tobacco: Never Used     Comment: never used tobacco  . Alcohol Use: No   OB History    Gravida Para Term Preterm AB TAB SAB Ectopic Multiple Living   2 2             Review of  Systems  Constitutional: Negative for fever and diaphoresis.  Respiratory: Negative for cough and shortness of breath.   Cardiovascular: Positive for chest pain.  All other systems reviewed and are negative.     Allergies  Ambien; Pyridium; and Sulfa antibiotics  Home Medications   Prior to Admission medications   Medication Sig Start Date End Date Taking? Authorizing Provider  aspirin 81 MG tablet Take 81 mg by mouth daily.   Yes Historical Provider, MD  buPROPion (WELLBUTRIN SR) 100 MG 12 hr tablet Take 100 mg by mouth every evening. 09/20/15  Yes Historical Provider, MD  Calcium Carbonate-Vitamin D (CALCIUM + D) 600-200 MG-UNIT TABS Take 1 tablet by mouth 2 (two) times daily.     Yes Historical Provider, MD  cholecalciferol (VITAMIN D) 1000 UNITS tablet Take 1,000 Units by mouth daily.   Yes Historical Provider, MD  Cranberry 250 MG CAPS Take 250 mg by mouth daily.   Yes Historical Provider, MD  exemestane (AROMASIN) 25 MG tablet Take 25 mg by mouth  daily after breakfast. 04/06/13  Yes Volanda Napoleon, MD  folic acid (FOLVITE) 1 MG tablet Take 1 tablet (1 mg total) by mouth daily. 10/28/12  Yes Lanice Shirts, MD  levothyroxine (SYNTHROID) 125 MCG tablet Take 1 tablet (125 mcg total) by mouth daily. 10/28/12  Yes Lanice Shirts, MD  LORazepam (ATIVAN) 1 MG tablet Take 0.5 mg by mouth at bedtime.    Yes Historical Provider, MD  losartan-hydrochlorothiazide (HYZAAR) 100-25 MG per tablet Take 0.5 tablets by mouth daily.    Yes Historical Provider, MD  Multiple Vitamins-Minerals (CENTRUM SILVER ADULT 50+ PO) Take 1 tablet by mouth daily.    Yes Historical Provider, MD  potassium chloride SA (K-DUR,KLOR-CON) 20 MEQ tablet Take 1 tablet (20 mEq total) by mouth daily. 03/25/13  Yes Darlin Coco, MD  rosuvastatin (CRESTOR) 20 MG tablet Take 10 mg by mouth daily.  08/01/15  Yes Historical Provider, MD  ALPRAZolam Duanne Moron) 0.5 MG tablet Take 0.5 mg by mouth daily. Reported on 11/17/2015 09/20/15   Historical Provider, MD  Cyanocobalamin (VITAMIN B-12) 5000 MCG TBDP Take 5,000 mcg by mouth daily. Reported on 11/17/2015    Historical Provider, MD  diltiazem (CARDIZEM) 30 MG tablet Take 30 mg by mouth daily as needed (palpitations). Reported on 11/17/2015 08/02/15   Historical Provider, MD  escitalopram (LEXAPRO) 20 MG tablet Take 20 mg by mouth daily. Reported on 11/17/2015 09/26/15   Historical Provider, MD  HYDROcodone-acetaminophen (NORCO/VICODIN) 5-325 MG per tablet Take 1-2 tablets by mouth every 6 (six) hours as needed for moderate pain. Patient not taking: Reported on 11/17/2015 08/25/14   Loni Dolly, PA-C  metaxalone (SKELAXIN) 800 MG tablet Take 800 mg by mouth daily as needed for muscle spasms. Reported on 11/17/2015 09/26/15   Historical Provider, MD  metoprolol succinate (TOPROL XL) 25 MG 24 hr tablet Take 0.5 tablets (12.5 mg total) by mouth daily. Patient not taking: Reported on 11/17/2015 08/27/15   Darlin Coco, MD  vitamin C  (ASCORBIC ACID) 500 MG tablet Take 500 mg by mouth daily. Reported on 11/17/2015    Historical Provider, MD   BP 156/81 mmHg  Pulse 84  Resp 37  Ht 5\' 3"  (1.6 m)  Wt 150 lb (68.04 kg)  BMI 26.58 kg/m2  SpO2 99% Physical Exam  Constitutional: She is oriented to person, place, and time. She appears well-developed and well-nourished. No distress.  HENT:  Head: Normocephalic.  Eyes: Conjunctivae are normal.  Neck: Neck supple.  No tracheal deviation present.  Cardiovascular: Normal rate, regular rhythm and normal heart sounds.   Pulmonary/Chest: Effort normal and breath sounds normal. No respiratory distress.  Abdominal: Soft. She exhibits no distension.  Neurological: She is alert and oriented to person, place, and time.  Skin: Skin is warm and dry.  Psychiatric: She has a normal mood and affect.  Vitals reviewed.   ED Course  Procedures (including critical care time) Labs Review Labs Reviewed  PROTIME-INR - Abnormal; Notable for the following:    Prothrombin Time 15.4 (*)    All other components within normal limits  APTT  CBC  COMPREHENSIVE METABOLIC PANEL  HEPARIN LEVEL (UNFRACTIONATED)  Randolm Idol, ED    Imaging Review Dg Chest Port 1 View  11/17/2015  CLINICAL DATA:  Chest pain on the left beginning today. No known injury. EXAM: PORTABLE CHEST 1 VIEW COMPARISON:  PA and lateral chest 10/02/2015.  CT chest 04/26/2015. FINDINGS: The lungs are clear. Heart size is normal. No pneumothorax or pleural effusion. Hiatal hernia noted. IMPRESSION: No acute disease. Hiatal hernia. Electronically Signed   By: Inge Rise M.D.   On: 11/17/2015 10:40   I have personally reviewed and evaluated these images and lab results as part of my medical decision-making.   EKG Interpretation   Date/Time:  Saturday November 17 2015 09:39:03 EST Ventricular Rate:  86 PR Interval:  165 QRS Duration: 137 QT Interval:  428 QTC Calculation: 512 R Axis:   -45 Text Interpretation:   Sinus rhythm Left bundle branch block Compared to  previous tracing Left bundle branch block ** ** Consider ACUTE MI if LBBB  is new ** ** Confirmed by Poppi Scantling MD, Sayda Grable [phone]) on 11/17/2015 9:59:53  AM      MDM   Final diagnoses:  Chest pain, unspecified chest pain type  LBBB (left bundle branch block)    81 y.o. female presents with chest pain starting from waking this morning after waking intermittently at rest. EKG with new LBBB from prior performed 1 month ago so code STEMI activated for emergent cardiology consultation. ASA administered.  Cardiology available at bedside and canceling code STEMI after first negative troponin. Will heparinize patient and admit for observation and further ACS evaluation.    Leo Grosser, MD 11/18/15 [phone]

## 2015-11-17 NOTE — ED Notes (Signed)
Cardiology at the bedside.

## 2015-11-17 NOTE — ED Notes (Signed)
Per Pt, Pt is coming from home with complaints of new onset of chest pain starting this morning after she woke up at 0400. Pt denies any other symptoms. Pt is alert and oriented x4.

## 2015-11-18 DIAGNOSIS — E7800 Pure hypercholesterolemia, unspecified: Secondary | ICD-10-CM | POA: Diagnosis not present

## 2015-11-18 DIAGNOSIS — R0789 Other chest pain: Secondary | ICD-10-CM | POA: Diagnosis not present

## 2015-11-18 DIAGNOSIS — F419 Anxiety disorder, unspecified: Secondary | ICD-10-CM | POA: Diagnosis not present

## 2015-11-18 DIAGNOSIS — R079 Chest pain, unspecified: Secondary | ICD-10-CM | POA: Diagnosis not present

## 2015-11-18 DIAGNOSIS — E039 Hypothyroidism, unspecified: Secondary | ICD-10-CM | POA: Diagnosis not present

## 2015-11-18 LAB — BASIC METABOLIC PANEL
ANION GAP: 13 (ref 5–15)
BUN: 9 mg/dL (ref 6–20)
CALCIUM: 8.9 mg/dL (ref 8.9–10.3)
CO2: 25 mmol/L (ref 22–32)
Chloride: 96 mmol/L — ABNORMAL LOW (ref 101–111)
Creatinine, Ser: 0.84 mg/dL (ref 0.44–1.00)
GFR calc Af Amer: >60 mL/min (ref >60)
GLUCOSE: 99 mg/dL (ref 65–99)
POTASSIUM: 3.3 mmol/L — AB (ref 3.5–5.1)
SODIUM: 134 mmol/L — AB (ref 135–145)

## 2015-11-18 LAB — HEPARIN LEVEL (UNFRACTIONATED): HEPARIN UNFRACTIONATED: 0.64 [IU]/mL (ref 0.30–0.70)

## 2015-11-18 LAB — TROPONIN I

## 2015-11-18 NOTE — Progress Notes (Signed)
Subjective:  No further CP, Enz neg  Objective:  Temp:  [98 F (36.7 C)-98.9 F (37.2 C)] 98.9 F (37.2 C) (12/25 0400) Pulse Rate:  [65-92] 83 (12/25 0740) Resp:  [18-38] 22 (12/25 0740) BP: (85-171)/(48-86) 136/69 mmHg (12/25 0700) SpO2:  [92 %-100 %] 99 % (12/25 0740) Weight:  [150 lb (68.04 kg)] 150 lb (68.04 kg) (12/24 1006) Weight change:   Intake/Output from previous day: 12/24 0701 - 12/25 0700 In: 932.8 [P.O.:720; I.V.:212.8] Out: 2000 [Urine:2000]  Intake/Output from this shift:    Physical Exam: General appearance: alert and no distress Neck: no adenopathy, no carotid bruit, no JVD, supple, symmetrical, trachea midline and thyroid not enlarged, symmetric, no tenderness/mass/nodules Lungs: clear to auscultation bilaterally Heart: regular rate and rhythm, S1, S2 normal, no murmur, click, rub or gallop Extremities: extremities normal, atraumatic, no cyanosis or edema  Lab Results: Results for orders placed or performed during the hospital encounter of 11/17/15 (from the past 48 hour(s))  APTT     Status: None   Collection Time: 11/17/15 10:04 AM  Result Value Ref Range   aPTT 26 24 - 37 seconds  CBC     Status: None   Collection Time: 11/17/15 10:04 AM  Result Value Ref Range   WBC 6.3 4.0 - 10.5 K/uL   RBC 4.64 3.87 - 5.11 MIL/uL   Hemoglobin 14.7 12.0 - 15.0 g/dL   HCT 43.3 36.0 - 46.0 %   MCV 93.3 78.0 - 100.0 fL   MCH 31.7 26.0 - 34.0 pg   MCHC 33.9 30.0 - 36.0 g/dL   RDW 13.2 11.5 - 15.5 %   Platelets 209 150 - 400 K/uL  Comprehensive metabolic panel     Status: Abnormal   Collection Time: 11/17/15 10:04 AM  Result Value Ref Range   Sodium 132 (L) 135 - 145 mmol/L   Potassium 3.9 3.5 - 5.1 mmol/L   Chloride 95 (L) 101 - 111 mmol/L   CO2 27 22 - 32 mmol/L   Glucose, Bld 104 (H) 65 - 99 mg/dL   BUN 10 6 - 20 mg/dL   Creatinine, Ser 0.82 0.44 - 1.00 mg/dL   Calcium 9.8 8.9 - 10.3 mg/dL   Total Protein 6.5 6.5 - 8.1 g/dL   Albumin 3.9 3.5  - 5.0 g/dL   AST 35 15 - 41 U/L   ALT 28 14 - 54 U/L   Alkaline Phosphatase 48 38 - 126 U/L   Total Bilirubin 0.8 0.3 - 1.2 mg/dL   GFR calc non Af Amer >60 >60 mL/min   GFR calc Af Amer >60 >60 mL/min    Comment: (NOTE) The eGFR has been calculated using the CKD EPI equation. This calculation has not been validated in all clinical situations. eGFR's persistently <60 mL/min signify possible Chronic Kidney Disease.    Anion gap 10 5 - 15  Protime-INR     Status: Abnormal   Collection Time: 11/17/15 10:04 AM  Result Value Ref Range   Prothrombin Time 15.4 (H) 11.6 - 15.2 seconds   INR 1.20 0.00 - 1.49  I-Stat Troponin, ED (not at [facility], [facility])     Status: None   Collection Time: 11/17/15 10:12 AM  Result Value Ref Range   Troponin i, poc 0.01 0.00 - 0.08 ng/mL   Comment 3            Comment: Due to the release kinetics of cTnI, a negative result within the first hours of the  onset of symptoms does not rule out myocardial infarction with certainty. If myocardial infarction is still suspected, repeat the test at appropriate intervals.   MRSA PCR Screening     Status: None   Collection Time: 11/17/15  1:19 PM  Result Value Ref Range   MRSA by PCR NEGATIVE NEGATIVE    Comment:        The GeneXpert MRSA Assay (FDA approved for NASAL specimens only), is one component of a comprehensive MRSA colonization surveillance program. It is not intended to diagnose MRSA infection nor to guide or monitor treatment for MRSA infections.   Troponin I     Status: None   Collection Time: 11/17/15  2:00 PM  Result Value Ref Range   Troponin I <0.03 <0.031 ng/mL    Comment:        NO INDICATION OF MYOCARDIAL INJURY.   Heparin level (unfractionated)     Status: Abnormal   Collection Time: 11/17/15  6:24 PM  Result Value Ref Range   Heparin Unfractionated <0.10 (L) 0.30 - 0.70 IU/mL    Comment:        IF HEPARIN RESULTS ARE BELOW EXPECTED VALUES, AND PATIENT DOSAGE HAS BEEN  CONFIRMED, SUGGEST FOLLOW UP TESTING OF ANTITHROMBIN III LEVELS.   Troponin I     Status: None   Collection Time: 11/17/15  6:24 PM  Result Value Ref Range   Troponin I <0.03 <0.031 ng/mL    Comment:        NO INDICATION OF MYOCARDIAL INJURY.   Troponin I     Status: None   Collection Time: 11/18/15  1:30 AM  Result Value Ref Range   Troponin I <0.03 <0.031 ng/mL    Comment:        NO INDICATION OF MYOCARDIAL INJURY.   Heparin level (unfractionated)     Status: None   Collection Time: 11/18/15  3:10 AM  Result Value Ref Range   Heparin Unfractionated 0.64 0.30 - 0.70 IU/mL    Comment:        IF HEPARIN RESULTS ARE BELOW EXPECTED VALUES, AND PATIENT DOSAGE HAS BEEN CONFIRMED, SUGGEST FOLLOW UP TESTING OF ANTITHROMBIN III LEVELS.   Basic metabolic panel     Status: Abnormal   Collection Time: 11/18/15  5:30 AM  Result Value Ref Range   Sodium 134 (L) 135 - 145 mmol/L   Potassium 3.3 (L) 3.5 - 5.1 mmol/L   Chloride 96 (L) 101 - 111 mmol/L   CO2 25 22 - 32 mmol/L   Glucose, Bld 99 65 - 99 mg/dL   BUN 9 6 - 20 mg/dL   Creatinine, Ser 0.84 0.44 - 1.00 mg/dL   Calcium 8.9 8.9 - 10.3 mg/dL   GFR calc non Af Amer >60 >60 mL/min   GFR calc Af Amer >60 >60 mL/min    Comment: (NOTE) The eGFR has been calculated using the CKD EPI equation. This calculation has not been validated in all clinical situations. eGFR's persistently <60 mL/min signify possible Chronic Kidney Disease.    Anion gap 13 5 - 15    Imaging: Imaging results have been reviewed  Tele- NSR  EKG- NSR 79 with LBBB. I personally reviewed  Assessment/Plan:   1. Active Problems: 2.   Chest pain 3.   Time Spent Directly with Patient:  20 minutes  Length of Stay:    No further CP. Enz neg. Exam benign. EKG shows LBBB. K 3.3--->> replete. D/C iv hep. Tx tele. Ambulate. Prob home in  AM.  Hannah Galvan 11/18/2015, 7:43 AM

## 2015-11-18 NOTE — Progress Notes (Signed)
ANTICOAGULATION CONSULT NOTE - Follow Up Consult  Pharmacy Consult for heparin Indication: chest pain/ACS   Labs:  Recent Labs  11/17/15 1004 11/17/15 1400 11/17/15 1824 11/18/15 0130 11/18/15 0310  HGB 14.7  --   --   --   --   HCT 43.3  --   --   --   --   PLT 209  --   --   --   --   APTT 26  --   --   --   --   LABPROT 15.4*  --   --   --   --   INR 1.20  --   --   --   --   HEPARINUNFRC  --   --  <0.10*  --  0.64  CREATININE 0.82  --   --   --   --   TROPONINI  --  <0.03 <0.03 <0.03  --      Assessment/Plan:  81yo female therapeutic on heparin after rate increase. Will continue gtt at current rate and confirm stable with additional level.   Hannah Galvan, PharmD, BCPS  11/18/2015,4:37 AM

## 2015-11-19 DIAGNOSIS — R079 Chest pain, unspecified: Secondary | ICD-10-CM | POA: Diagnosis not present

## 2015-11-19 DIAGNOSIS — I447 Left bundle-branch block, unspecified: Secondary | ICD-10-CM | POA: Diagnosis not present

## 2015-11-19 MED ORDER — LOSARTAN POTASSIUM 100 MG PO TABS: 100.0000 mg | ORAL_TABLET | Freq: Every day | ORAL | Status: DC

## 2015-11-19 MED ORDER — NITROGLYCERIN 0.4 MG SL SUBL: 0.4000 mg | SUBLINGUAL_TABLET | SUBLINGUAL | Status: DC | PRN

## 2015-11-19 MED ORDER — ROSUVASTATIN CALCIUM 10 MG PO TABS: 10.0000 mg | ORAL_TABLET | Freq: Every day | ORAL | Status: DC

## 2015-11-19 NOTE — Progress Notes (Addendum)
Primary Cardiologist: Gwenlyn Found (NEEDS TO BE ESTABLISHED)  Cardiology Specific Problem List: 1. Chest Pain 2. Hypertension   Subjective:   No chest pain overnight. Had panic attack but resolved with Ativan. Ready to go home.    Objective:   Temp:  [97.2 F (36.2 C)-98 F (36.7 C)] 97.9 F (36.6 C) (12/26 0443) Pulse Rate:  [77-85] 80 (12/26 0443) Resp:  [16-22] 16 (12/26 0443) BP: (102-151)/(59-117) 127/61 mmHg (12/26 0443) SpO2:  [94 %-99 %] 98 % (12/26 0443) Weight:  [150 lb 9.6 oz (68.312 kg)] 150 lb 9.6 oz (68.312 kg) (12/26 0443) Last BM Date: 11/17/15  Filed Weights   11/17/15 1006 11/19/15 0443  Weight: 150 lb (68.04 kg) 150 lb 9.6 oz (68.312 kg)    Intake/Output Summary (Last 24 hours) at 11/19/15 [id number] Last data filed at 11/18/15 1310  Gross per 24 hour  Intake    350 ml  Output      0 ml  Net    350 ml    Telemetry: NSR 70's and 80's.   Exam:  General: No acute distress.  HEENT: Conjunctiva and lids normal, oropharynx clear.  Lungs: Clear to auscultation, nonlabored.  Cardiac: No elevated JVP or bruits. RRR, no gallop or rub.   Abdomen: Normoactive bowel sounds, nontender, nondistended.  Extremities: No pitting edema, distal pulses full.  Neuropsychiatric: Alert and oriented x3, affect appropriate.   Lab Results:  Basic Metabolic Panel:  Recent Labs Lab 11/17/15 1004 11/18/15 0530  NA 132* 134*  K 3.9 3.3*  CL 95* 96*  CO2 27 25  GLUCOSE 104* 99  BUN 10 9  CREATININE 0.82 0.84  CALCIUM 9.8 8.9    Liver Function Tests:  Recent Labs Lab 11/17/15 1004  AST 35  ALT 28  ALKPHOS 48  BILITOT 0.8  PROT 6.5  ALBUMIN 3.9    CBC:  Recent Labs Lab 11/17/15 1004  WBC 6.3  HGB 14.7  HCT 43.3  MCV 93.3  PLT 209    Cardiac Enzymes:  Recent Labs Lab 11/17/15 1400 11/17/15 1824 11/18/15 0130  TROPONINI <0.03 <0.03 <0.03    BNP:  Recent Labs  06/28/15 1535  PROBNP 44.0    Coagulation:  Recent Labs Lab  11/17/15 1004  INR 1.20    Radiology: Dg Chest Port 1 View  11/17/2015  CLINICAL DATA:  Chest pain on the left beginning today. No known injury. EXAM: PORTABLE CHEST 1 VIEW COMPARISON:  PA and lateral chest 10/02/2015.  CT chest 04/26/2015. FINDINGS: The lungs are clear. Heart size is normal. No pneumothorax or pleural effusion. Hiatal hernia noted. IMPRESSION: No acute disease. Hiatal hernia. Electronically Signed   By: Inge Rise M.D.   On: 11/17/2015 10:40     Medications:   Scheduled Medications: . ALPRAZolam  0.5 mg Oral Daily  . aspirin EC  81 mg Oral Daily  . buPROPion  100 mg Oral QPM  . cholecalciferol  1,000 Units Oral Daily  . escitalopram  20 mg Oral Daily  . folic acid  1 mg Oral Daily  . hydrochlorothiazide  25 mg Oral Daily  . levothyroxine  125 mcg Oral QAC breakfast  . LORazepam  0.5 mg Oral QHS  . losartan  100 mg Oral Daily  . potassium chloride SA  20 mEq Oral Daily  . rosuvastatin  10 mg Oral q1800      PRN Medications: acetaminophen, metaxalone, nitroGLYCERIN, ondansetron (ZOFRAN) IV   Assessment and Plan:   1.Chest Pain:  Resolved. Troponin is negative. No further reoccurrences overnight. Continue prn NTG. Consider low dose long acting nitrates on follow up.   2. Hypertension: Currently well controlled on losartan.   3. Anxiety: Likely cause of her chest discomfort. Continue antianxiety meds.   Phill Myron. Lawrence NP AACC  11/19/2015, 6:52 AM   Agree with note by Jory Sims NP  Pt with Atyp CP, new LBBB and neg enz. Suspect Sx secondary to anxiety. OK for DC this AM. Will follow up as OP.  Lorretta Harp, M.D., [place], [facility], [place] [address] [place], [place]  [id number]  [phone] 11/19/2015 7:17 AM

## 2015-11-19 NOTE — Discharge Summary (Signed)
Physician Discharge Summary  Patient ID: Hannah Galvan MRN: [id number] DOB/AGE: 1931-03-22 81 y.o.  Admit date: 11/17/2015 Discharge date: 11/19/2015  Primary Discharge Diagnosis 1. Non cardiac chest pain 2. Hypertension  Secondary Discharge Diagnosis 1. Anxiety 2. Hypercholesterolemia 3. Hypothyroidsim  Primary Cardiologist: Needs to be Established  Significant Diagnostic Studies: None  Hospital Course:       Hannah Galvan is an 81 year old female patient, previously followed by Dr. Mare Ferrari, but currently being seen by Richardson Dopp, PA, until primary cardiologist can be established. The patient has a history of hypertension, hyperlipidemia, hypothyroidism, palpitations and anxiety.     She was admitted on 11/17/2015 with chest pain which awoke her around 4:56 AM in the morning. A codeSTEMI was called as she appeared to have a new left bundle branch block on EKG. By the time she came to the emergency room her chest pain was resolved. Troponins were cycled and found to be negative 3. She was placed on potassium supplement. 2. Mild hypokalemia. Blood pressure was well-controlled. HCTZ portion of antihypertensive medication was discontinued. No medications were changed. The patient had no recurrence of chest pain since admission. Had a mild anxiety attack the night before discharge for which she was treated with Ativan and this resolved and she slept well. She was anxious to return home this morning.      She was seen and examined by Dr. Gwenlyn Found on morning of discharge. He is willing to accept her as a patient.   Discharge Exam: Blood pressure 127/61, pulse 80, temperature 97.9 F (36.6 C), temperature source Oral, resp. rate 16, height 5\' 3"  (1.6 m), weight 150 lb 9.6 oz (68.312 kg), SpO2 98 %.   Labs:   Lab Results  Component Value Date   WBC 6.3 11/17/2015   HGB 14.7 11/17/2015   HCT 43.3 11/17/2015   MCV 93.3 11/17/2015   PLT 209 11/17/2015     Recent Labs Lab  11/17/15 1004 11/18/15 0530  NA 132* 134*  K 3.9 3.3*  CL 95* 96*  CO2 27 25  BUN 10 9  CREATININE 0.82 0.84  CALCIUM 9.8 8.9  PROT 6.5  --   BILITOT 0.8  --   ALKPHOS 48  --   ALT 28  --   AST 35  --   GLUCOSE 104* 99   Lab Results  Component Value Date   TROPONINI <0.03 11/18/2015    Lab Results  Component Value Date   CHOL 164 10/04/2012   CHOL 232* 06/02/2012   CHOL 164 09/15/2011   Lab Results  Component Value Date   HDL 60.50 10/04/2012   HDL 44 06/02/2012   HDL 56 09/15/2011   Lab Results  Component Value Date   LDLCALC 91 10/04/2012   LDLCALC 162* 06/02/2012   LDLCALC 91 09/15/2011   Lab Results  Component Value Date   TRIG 64.0 10/04/2012   TRIG 131 06/02/2012   TRIG 84 09/15/2011   Lab Results  Component Value Date   CHOLHDL 3 10/04/2012   CHOLHDL 5.3 06/02/2012   CHOLHDL 2.9 09/15/2011     Radiology: Dg Chest Port 1 View  11/17/2015  CLINICAL DATA:  Chest pain on the left beginning today. No known injury. EXAM: PORTABLE CHEST 1 VIEW COMPARISON:  PA and lateral chest 10/02/2015.  CT chest 04/26/2015. FINDINGS: The lungs are clear. Heart size is normal. No pneumothorax or pleural effusion. Hiatal hernia noted. IMPRESSION: No acute disease. Hiatal hernia. Electronically Signed   By:  Inge Rise M.D.   On: 11/17/2015 10:40    EKG: NSR with LBBB.  FOLLOW UP PLANS AND APPOINTMENTS     Discharge Instructions    Diet - low sodium heart healthy    Complete by:  As directed      Increase activity slowly    Complete by:  As directed             Medication List    STOP taking these medications        losartan-hydrochlorothiazide 100-25 MG tablet  Commonly known as:  HYZAAR      TAKE these medications        ALPRAZolam 0.5 MG tablet  Commonly known as:  XANAX  Take 0.5 mg by mouth daily. Reported on 11/17/2015     aspirin 81 MG tablet  Take 81 mg by mouth daily.     buPROPion 100 MG 12 hr tablet  Commonly known as:   WELLBUTRIN SR  Take 100 mg by mouth every evening.     Calcium Carbonate-Vitamin D 600-200 MG-UNIT Tabs  Take 1 tablet by mouth 2 (two) times daily.     CENTRUM SILVER ADULT 50+ PO  Take 1 tablet by mouth daily.     cholecalciferol 1000 UNITS tablet  Commonly known as:  VITAMIN D  Take 1,000 Units by mouth daily.     Cranberry 250 MG Caps  Take 250 mg by mouth daily.     diltiazem 30 MG tablet  Commonly known as:  CARDIZEM  Take 30 mg by mouth daily as needed (palpitations). Reported on 11/17/2015     escitalopram 20 MG tablet  Commonly known as:  LEXAPRO  Take 20 mg by mouth daily. Reported on 11/17/2015     exemestane 25 MG tablet  Commonly known as:  AROMASIN  Take 25 mg by mouth daily after breakfast.     folic acid 1 MG tablet  Commonly known as:  FOLVITE  Take 1 tablet (1 mg total) by mouth daily.     HYDROcodone-acetaminophen 5-325 MG tablet  Commonly known as:  NORCO/VICODIN  Take 1-2 tablets by mouth every 6 (six) hours as needed for moderate pain.     levothyroxine 125 MCG tablet  Commonly known as:  SYNTHROID  Take 1 tablet (125 mcg total) by mouth daily.     LORazepam 1 MG tablet  Commonly known as:  ATIVAN  Take 0.5 mg by mouth at bedtime.     losartan 100 MG tablet  Commonly known as:  COZAAR  Take 1 tablet (100 mg total) by mouth daily.     metaxalone 800 MG tablet  Commonly known as:  SKELAXIN  Take 800 mg by mouth daily as needed for muscle spasms. Reported on 11/17/2015     metoprolol succinate 25 MG 24 hr tablet  Commonly known as:  TOPROL XL  Take 0.5 tablets (12.5 mg total) by mouth daily.     nitroGLYCERIN 0.4 MG SL tablet  Commonly known as:  NITROSTAT  Place 1 tablet (0.4 mg total) under the tongue every 5 (five) minutes as needed for chest pain.     potassium chloride SA 20 MEQ tablet  Commonly known as:  K-DUR,KLOR-CON  Take 1 tablet (20 mEq total) by mouth daily.     rosuvastatin 10 MG tablet  Commonly known as:  CRESTOR   Take 1 tablet (10 mg total) by mouth daily at 6 PM.     Vitamin B-12 5000 MCG Tbdp  Take 5,000 mcg by mouth daily. Reported on 11/17/2015     vitamin C 500 MG tablet  Commonly known as:  ASCORBIC ACID  Take 500 mg by mouth daily. Reported on 11/17/2015       Follow-up Information    Please follow up.   Why:  Our office will call you for follow up appointment.         Time spent with patient to include physician time: 30 mintues Signed: Phill Myron. Lawrence NP A[facility]  11/19/2015, 7:23 AM Co-Sign MD

## 2015-11-19 NOTE — Discharge Instructions (Signed)
Generalized Anxiety Disorder Generalized anxiety disorder (GAD) interferes with life functions, including relationships, work, and school. GAD is different from normal anxiety, which everyone experiences at some point in their lives in response to specific life events and activities. Normal anxiety actually helps [place] prepare for and get through these life events and activities. Normal anxiety goes away after the event or activity is over.  GAD causes anxiety that is not necessarily related to specific events or activities. It also causes excess anxiety in proportion to specific events or activities. The anxiety associated with GAD is also difficult to control. GAD can vary from mild to severe. People with severe GAD can have intense waves of anxiety with physical symptoms (panic attacks).  SYMPTOMS The anxiety and worry associated with GAD are difficult to control. This anxiety and worry are related to many life events and activities and also occur more days than not for 6 months or longer. People with GAD also have three or more of the following symptoms (one or more in children):  Restlessness.   Fatigue.  Difficulty concentrating.   Irritability.  Muscle tension.  Difficulty sleeping or unsatisfying sleep. DIAGNOSIS GAD is diagnosed through an assessment by your health care provider. Your health care provider will ask you questions aboutyour mood,physical symptoms, and events in your life. Your health care provider may ask you about your medical history and use of alcohol or drugs, including prescription medicines. Your health care provider may also do a physical exam and blood tests. Certain medical conditions and the use of certain substances can cause symptoms similar to those associated with GAD. Your health care provider may refer you to a mental health specialist for further evaluation. TREATMENT The following therapies are usually used to treat GAD:   Medication. Antidepressant  medication usually is prescribed for long-term daily control. Antianxiety medicines may be added in severe cases, especially when panic attacks occur.   Talk therapy (psychotherapy). Certain types of talk therapy can be helpful in treating GAD by providing support, education, and guidance. A form of talk therapy called cognitive behavioral therapy can teach you healthy ways to think about and react to daily life events and activities.  Stress managementtechniques. These include yoga, meditation, and exercise and can be very helpful when they are practiced regularly. A mental health specialist can help determine which treatment is best for you. Some people see improvement with one therapy. However, other people require a combination of therapies.   This information is not intended to replace advice given to you by your health care provider. Make sure you discuss any questions you have with your health care provider.   Document Released: 03/07/2013 Document Revised: 12/01/2014 Document Reviewed: 03/07/2013 Elsevier Interactive Patient Education [employer].

## 2015-11-20 ENCOUNTER — Telehealth: Payer: Self-pay | Admitting: Cardiovascular Disease

## 2015-11-20 NOTE — Telephone Encounter (Signed)
D/C phone call appt is on 11/29/15 at 3:20pm w/ Richardson Dopp at the [facility] .Marland Kitchen  Thanks

## 2015-11-20 NOTE — Telephone Encounter (Signed)
Patient contacted regarding discharge from [facility] on 11/19/2015.  Patient understands to follow up with provider Richardson Dopp on 11/29/2015 at 03:20 at [facility].. Patient understands discharge instructions? Yes  Patient understands medications and regiment? Yes Patient understands to bring all medications to this visit? Yes

## 2015-11-23 ENCOUNTER — Ambulatory Visit (INDEPENDENT_AMBULATORY_CARE_PROVIDER_SITE_OTHER): Payer: Medicare Other | Admitting: Family Medicine

## 2015-11-23 ENCOUNTER — Encounter: Payer: Self-pay | Admitting: Family Medicine

## 2015-11-23 VITALS — BP 132/74 | HR 80 | Temp 98.6°F | Resp 14 | Ht 62.5 in | Wt 154.0 lb

## 2015-11-23 DIAGNOSIS — Z853 Personal history of malignant neoplasm of breast: Secondary | ICD-10-CM | POA: Diagnosis not present

## 2015-11-23 DIAGNOSIS — E785 Hyperlipidemia, unspecified: Secondary | ICD-10-CM

## 2015-11-23 DIAGNOSIS — E038 Other specified hypothyroidism: Secondary | ICD-10-CM | POA: Diagnosis not present

## 2015-11-23 DIAGNOSIS — I10 Essential (primary) hypertension: Secondary | ICD-10-CM

## 2015-11-23 DIAGNOSIS — Z7189 Other specified counseling: Secondary | ICD-10-CM | POA: Diagnosis not present

## 2015-11-23 DIAGNOSIS — Z7689 Persons encountering health services in other specified circumstances: Secondary | ICD-10-CM

## 2015-11-23 DIAGNOSIS — F411 Generalized anxiety disorder: Secondary | ICD-10-CM | POA: Diagnosis not present

## 2015-11-23 MED ORDER — ESCITALOPRAM OXALATE 10 MG PO TABS: 10.0000 mg | ORAL_TABLET | Freq: Every day | ORAL | Status: DC

## 2015-11-23 NOTE — Progress Notes (Signed)
Subjective:    Patient ID: Hannah Galvan, female    DOB: 12-Dec-1930, 81 y.o.   MRN: [id number]  HPI Recently admitted with chest pain and found to have a new LBBB on EKG but troponins were cycled and found to be negative x 3.  Was discharged home with outpatient follow up planned with cards, Dr. Gwenlyn Found.  Upon  Further questioning , the patient reports to daily anxiety attacks. She's been to the hospital several times with chest pain and shortness of breath which she now believes could be anxiety related. In the past she was on numerous medications including Lexapro and Wellbutrin pain medication and Xanax. At that time her son reports that she was overly sedated /overmedicated. They sent her to live with family members in [place] who gradually stopped several of her medicines and the patient improved dramatically. However in [place] she was living with her family and was occupied on a daily basis, eating with, and seeing her family on a daily basis.   She subsequently moved back home this fall after abruptly stopping Lexapro. Since being back home she is living independently. She admits that she is very lonely. Around that same time the panic attacks began happening almost on a daily basis.  Past medical history is significant for breast cancer, hypertension, hyperlipidemia, hypothyroidism status post partial thyroidectomy , an anxiety. She is due for mammogram. The remainder of her preventative care is up-to-date. She had a bone density in 2013 which was normal. It appears that she had Pneumovax 23 in 2013 however I see no documentation of Prevnar 13. This is due but the patient elects not to have this today. Past Medical History  Diagnosis Date  . Thyroid disease   . Hypertension   . Hyperlipidemia   . Arthritis   . Cancer ([place])     breast, s/p RXT, bilateral  . Hypothyroidism   . Shortness of breath   . Anxiety   . History of stress test     Lexiscan Myoview 8/16:  EF 81%, breast attenuation,  no ischemia; Low Risk  . History of echocardiogram     Echo 8/16: Vigorous LVF, EF [id number], grade 1 diastolic dysfunction, trivial AI, mild MR, mild RVE, normal RV function, PASP 33 mmHg  . Allergy   . Blood transfusion without reported diagnosis   . Cataract    Past Surgical History  Procedure Laterality Date  . Abdominal hysterectomy  2008  . Thyroid surgery  2002  . Breast lumpectomy  2000  . Breast lumpectomy  2007  . Cataract extraction  2011  . Rotator cuff repair      right  . Knee surgery      right  . Bladder tact    . Total knee arthroplasty Left 06/02/2013    Dr Rhona Raider  . Total knee arthroplasty Right 06/02/2013    Procedure: RIGHT TOTAL KNEE ARTHROPLASTY;  Surgeon: Hessie Dibble, MD;  Location: [place];  Service: Orthopedics;  Laterality: Right;  . Total knee arthroplasty Left 08/22/2014    Procedure: TOTAL KNEE ARTHROPLASTY;  Surgeon: Hessie Dibble, MD;  Location: [place];  Service: Orthopedics;  Laterality: Left;  . Joint replacement     Current Outpatient Prescriptions on File Prior to Visit  Medication Sig Dispense Refill  . aspirin 81 MG tablet Take 81 mg by mouth daily.    Marland Kitchen buPROPion (WELLBUTRIN SR) 100 MG 12 hr tablet Take 100 mg by mouth every evening.  1  .  Calcium Carbonate-Vitamin D (CALCIUM + D) 600-200 MG-UNIT TABS Take 1 tablet by mouth 2 (two) times daily.      . cholecalciferol (VITAMIN D) 1000 UNITS tablet Take 1,000 Units by mouth daily.    . Cranberry 250 MG CAPS Take 250 mg by mouth daily.    . Cyanocobalamin (VITAMIN B-12) 5000 MCG TBDP Take 5,000 mcg by mouth daily. Reported on 11/17/2015    . exemestane (AROMASIN) 25 MG tablet Take 25 mg by mouth daily after breakfast.    . levothyroxine (SYNTHROID) 125 MCG tablet Take 1 tablet (125 mcg total) by mouth daily. 90 tablet 1  . LORazepam (ATIVAN) 1 MG tablet Take 0.5 mg by mouth at bedtime.     . Multiple Vitamins-Minerals (CENTRUM SILVER ADULT 50+ PO) Take 1 tablet by mouth daily.     .  nitroGLYCERIN (NITROSTAT) 0.4 MG SL tablet Place 1 tablet (0.4 mg total) under the tongue every 5 (five) minutes as needed for chest pain. 30 tablet 12  . potassium chloride SA (K-DUR,KLOR-CON) 20 MEQ tablet Take 1 tablet (20 mEq total) by mouth daily. 90 tablet 1  . rosuvastatin (CRESTOR) 10 MG tablet Take 1 tablet (10 mg total) by mouth daily at 6 PM. 30 tablet 11  . ALPRAZolam (XANAX) 0.5 MG tablet Take 0.5 mg by mouth daily. Reported on 11/23/2015  1   No current facility-administered medications on file prior to visit.   Allergies  Allergen Reactions  . Ambien [Zolpidem]     confusion  . Pyridium [Phenazopyridine Hcl] Other (See Comments)    hemolysis  . Sulfa Antibiotics Rash   Social History   Social History  . Marital Status: Widowed    Spouse Name: N/A  . Number of Children: N/A  . Years of Education: N/A   Occupational History  . Not on file.   Social History Main Topics  . Smoking status: Never Smoker   . Smokeless tobacco: Never Used     Comment: never used tobacco  . Alcohol Use: No  . Drug Use: No  . Sexual Activity: Not Currently   Other Topics Concern  . Not on file   Social History Narrative      Review of Systems  All other systems reviewed and are negative.      Objective:   Physical Exam  Constitutional: She is oriented to person, place, and time. She appears well-developed and well-nourished. No distress.  HENT:  Head: Normocephalic and atraumatic.  Right Ear: External ear normal.  Left Ear: External ear normal.  Nose: Nose normal.  Mouth/Throat: Oropharynx is clear and moist. No oropharyngeal exudate.  Eyes: Conjunctivae and EOM are normal. No scleral icterus.  Neck: Neck supple. No JVD present. No thyromegaly present.  Cardiovascular: Normal rate and regular rhythm.   Murmur heard. Pulmonary/Chest: Effort normal and breath sounds normal. No respiratory distress. She has no wheezes. She has no rales.  Abdominal: Soft. Bowel sounds are  normal. She exhibits no distension. There is no tenderness. There is no rebound and no guarding.  Musculoskeletal: She exhibits no edema or tenderness.  Lymphadenopathy:    She has no cervical adenopathy.  Neurological: She is alert and oriented to person, place, and time. She has normal reflexes. She displays normal reflexes. No cranial nerve deficit. She exhibits normal muscle tone. Coordination normal.  Skin: She is not diaphoretic.  Psychiatric: She has a normal mood and affect. Her behavior is normal. Judgment and thought content normal.  Vitals reviewed.  Assessment & Plan:  Establishing care with new doctor, encounter for  History of breast cancer - Plan: MM Digital Screening  Essential hypertension  HLD (hyperlipidemia)  Other specified hypothyroidism  GAD (generalized anxiety disorder) - Plan: escitalopram (LEXAPRO) 10 MG tablet  her anxiety is likely multifactorial.   I believe part of it is due to being lonely and being independent away from family. I encouraged the patient to try to become more active such as joining a senior group or volunteering in a local church or school or hospital. She may even benefit from seeing a therapist. However I believe she would also benefit from being on a preventative medicine. I recommended starting Lexapro 10 mg by mouth daily as a preventative. I believe that she is having fewer panic attacks when she was on the Lexapro. She does not see that she benefited from Wellbutrin and therefore I recommended her discontinuing this medication to avoid polypharmacy. I would like the patient to return fasting to check a CBC, CMP, fasting lipid panel, and a TSH. I will like to recheck the patient in one month. I will also schedule her for screening mammogram.

## 2015-11-28 NOTE — Progress Notes (Signed)
Cardiology Office Note    Date:  11/29/2015   ID:  Hannah Galvan, DOB 1930-12-22, MRN [id number]  PCP:  [name], MD  Cardiologist:  Dr. Darlin Coco  (PA: Richardson Dopp, PA-C) Electrophysiologist:  n/a  Chief Complaint  Patient presents with  . Hospitalization Follow-up    admx with chest pain 10/2015    History of Present Illness:  Hannah Galvan is a 82 y.o. female with a hx of HTN, HL, hypothyroidism, palpitations, breast CA (R in 2000, L in 2007), HL (intol to Crestor), mild AI, mild MR.   She was seen in 8/16 with complaints of rapid palpitations and DOE and CP. I placed her on Toprol-XL 12.5 mg QD. Echo, Myoview and Event Monitor was arranged. Myoview was low risk with no ischemia and normal LVF. Echo demonstrated vigorous LVF, mild diastolic dysfunction and PASP 33 mmHg. Event monitor demonstrated NSR, PACs and an episode of possible PAF with CVR. Dr. Darlin Coco placed her on Apixaban as her CHADS2-VASc=4.  Her monitor was reviewed further by Dr. Mare Ferrari and the episode in question that demonstrated atrial fibrillation was felt to be artifact and frequent PACs. It was recommended that she stop Eliquis and return to taking baby aspirin daily. Toprol-XL was recommended to take to help with palpitations.   She was admitted 12/24-12/26 with chest pain. Cardiac enzymes remained normal.  She was noted to have a new LBBB.  HCTZ was stopped secondary to hypokalemia.  She returns for follow-up.  She has had one other episode of chest discomfort since returning from the hospital. She notes a substernal and left-sided dull ache. She sometimes notes symptoms with meals. She notes a lot of indigestion at times. She has chronic dyspnea exertion without significant change. Denies orthopnea, PND or edema. Denies syncope. She denies exertional chest symptoms.    Past Medical History  Diagnosis Date  . Thyroid disease   . Hypertension   . Hyperlipidemia   . Arthritis    . Cancer ([place])     breast, s/p RXT, bilateral  . Hypothyroidism   . Shortness of breath   . Anxiety   . History of stress test     Lexiscan Myoview 8/16:  EF 81%, breast attenuation, no ischemia; Low Risk  . History of echocardiogram     Echo 8/16: Vigorous LVF, EF Q000111Q, grade 1 diastolic dysfunction, trivial AI, mild MR, mild RVE, normal RV function, PASP 33 mmHg  . Allergy   . Blood transfusion without reported diagnosis   . Cataract     Past Surgical History  Procedure Laterality Date  . Abdominal hysterectomy  2008  . Thyroid surgery  2002  . Breast lumpectomy  2000  . Breast lumpectomy  2007  . Cataract extraction  2011  . Rotator cuff repair      right  . Knee surgery      right  . Bladder tact    . Total knee arthroplasty Left 06/02/2013    Dr Rhona Raider  . Total knee arthroplasty Right 06/02/2013    Procedure: RIGHT TOTAL KNEE ARTHROPLASTY;  Surgeon: Hessie Dibble, MD;  Location: [place];  Service: Orthopedics;  Laterality: Right;  . Total knee arthroplasty Left 08/22/2014    Procedure: TOTAL KNEE ARTHROPLASTY;  Surgeon: Hessie Dibble, MD;  Location: [place];  Service: Orthopedics;  Laterality: Left;  . Joint replacement      Current Outpatient Prescriptions  Medication Sig Dispense Refill  . aspirin 81 MG tablet  Take 81 mg by mouth daily.    . Calcium Carbonate-Vitamin D (CALCIUM + D) 600-200 MG-UNIT TABS Take 1 tablet by mouth 2 (two) times daily.      . cholecalciferol (VITAMIN D) 1000 UNITS tablet Take 1,000 Units by mouth daily.    . Cranberry 250 MG CAPS Take 250 mg by mouth daily.    . Cyanocobalamin (VITAMIN B-12) 5000 MCG TBDP Take 5,000 mcg by mouth daily. Reported on 11/17/2015    . escitalopram (LEXAPRO) 10 MG tablet Take 1 tablet (10 mg total) by mouth daily. 30 tablet 5  . exemestane (AROMASIN) 25 MG tablet Take 25 mg by mouth daily after breakfast.    . folic acid (FOLVITE) 1 MG tablet Take 1 mg by mouth daily.    Marland Kitchen levothyroxine (SYNTHROID) 125  MCG tablet Take 1 tablet (125 mcg total) by mouth daily. 90 tablet 1  . LORazepam (ATIVAN) 1 MG tablet Take 0.5 mg by mouth at bedtime.     . Multiple Vitamins-Minerals (CENTRUM SILVER ADULT 50+ PO) Take 1 tablet by mouth daily.     . nitroGLYCERIN (NITROSTAT) 0.4 MG SL tablet Place 1 tablet (0.4 mg total) under the tongue every 5 (five) minutes as needed for chest pain. 25 tablet 3  . potassium chloride SA (K-DUR,KLOR-CON) 20 MEQ tablet Take 1 tablet (20 mEq total) by mouth daily. 90 tablet 1  . rosuvastatin (CRESTOR) 10 MG tablet Take 1 tablet (10 mg total) by mouth daily at 6 PM. 30 tablet 11  . losartan (COZAAR) 100 MG tablet Take 1 tablet (100 mg total) by mouth daily.    . pantoprazole (PROTONIX) 40 MG tablet Take 1 tablet (40 mg total) by mouth as directed. 40 MG DAILY FOR 1 MONTH THEN AS NEEDED 30 tablet 1   No current facility-administered medications for this visit.    Allergies:   Ambien; Pyridium; and Sulfa antibiotics   Social History   Social History  . Marital Status: Widowed    Spouse Name: N/A  . Number of Children: N/A  . Years of Education: N/A   Social History Main Topics  . Smoking status: Never Smoker   . Smokeless tobacco: Never Used     Comment: never used tobacco  . Alcohol Use: No  . Drug Use: No  . Sexual Activity: Not Currently   Other Topics Concern  . None   Social History Narrative     Family History:  The patient's family history includes Cancer in her brother, brother, brother, brother, brother, father, sister, sister, and sister; Heart disease in her sister; Hypertension in her mother; Stroke in her mother.   ROS:   Please see the history of present illness.    Review of Systems  Hematologic/Lymphatic: Bruises/bleeds easily.  Musculoskeletal: Positive for back pain and myalgias.  Psychiatric/Behavioral: The patient is nervous/anxious.   All other systems reviewed and are negative.   PHYSICAL EXAM:   VS:  BP 136/62 mmHg  Pulse 76  Ht  5' 2.5" (1.588 m)  Wt 149 lb 1.9 oz (67.64 kg)  BMI 26.82 kg/m2   GEN: Well nourished, well developed, in no acute distress HEENT: normal Neck: no JVD, no masses Cardiac: Normal 99991111, RRR; 2/6 systolic murmurs RUSB, no rubs, or gallops, no edema   Respiratory:  clear to auscultation bilaterally; no wheezing, rhonchi or rales GI: soft, nontender, nondistended,   MS: no deformity or atrophy Skin: warm and dry, no rash Neuro:   no focal deficits  Psych:  Alert and oriented x 3, normal affect  Wt Readings from Last 3 Encounters:  11/29/15 149 lb 1.9 oz (67.64 kg)  11/23/15 154 lb (69.854 kg)  11/19/15 150 lb 9.6 oz (68.312 kg)      Studies/Labs Reviewed:   EKG:  EKG is ordered today.  The ekg ordered today demonstrates NSR, HR 76, LAD, PVCs, QTc 447 ms, no change since prior tracing   Recent Labs: 06/28/2015: Pro B Natriuretic peptide (BNP) 44.0 07/03/2015: TSH 0.49 11/17/2015: ALT 28; Hemoglobin 14.7; Platelets 209 11/18/2015: BUN 9; Creatinine, Ser 0.84; Potassium 3.3*; Sodium 134*   Recent Lipid Panel    Component Value Date/Time   CHOL 164 10/04/2012 0840   TRIG 64.0 10/04/2012 0840   HDL 60.50 10/04/2012 0840   CHOLHDL 3 10/04/2012 0840   VLDL 12.8 10/04/2012 0840   LDLCALC 91 10/04/2012 0840    Additional studies/ records that were reviewed today include:   Event monitor 8/16 NSR with frequent PACs No atrial fibrillation  Myoview 8/16 EF 81%, apical defect most likely consistent with breast attenuation, no ischemia, low risk  Echo 07/09/15 Vigorous LVF, EF Q000111Q, grade 1 diastolic dysfunction, trivial AI, MAC, mild MR, mild RVE, normal RV function, PASP 33 mmHg  Holter 11/2014 NSR, PACs  Echo 06/2012 EF 60-65%, normal wall motion, grade 1 diastolic dysfunction, trivial AI, mild MR, mild SAM, PASP 36 mmHg  Event Monitor 07/2012 NSR, PACs, PVCs  Carotid US 10/12 No significant carotid bifurcation plaque or stenosis.   ASSESSMENT:    1. Chest pain,  unspecified chest pain type   2. Essential hypertension, benign   3. Hypokalemia   4. LBBB (left bundle branch block)   5. Hyperlipidemia     PLAN:  In order of problems listed above:  1. Chest Pain -  Somewhat atypical. She had a negative Myoview in 8/16. Recent admission to the hospital is negative cardiac enzymes. She did have a left bundle branch block on EKG. Her current ECG no longer demonstrates this. I am not certain what this means, if anything. She likely just has a transient left bundle branch block. I will repeat her echocardiogram given her recent LBBB on ECG. Question if she's having some acid reflux contributing to her symptoms. I will start on Protonix 40 mg daily 1 month, then as needed. Follow-up in 6-8 weeks.  2. HTN - Controlled.  3. Hypokalemia - Check BMET today. She is off HCTZ.  4. LBBB - Transient.  Repeat Echo.  5. HL - Continue statin.    Medication Adjustments/Labs and Tests Ordered: Current medicines are reviewed at length with the patient today.  Concerns regarding medicines are outlined above.  Medication changes, Labs and Tests ordered today are listed below. Patient Instructions  Medication Instructions:  1. CONTINUE ON LOSARTAN 100 MG DAILY  2. START PROTONIX 40 MG DAILY FOR 1 MONTH; THEN TAKE AS NEEDED  Labwork: TODAY BMET  Testing/Procedures: Your physician has requested that you have an echocardiogram. Echocardiography is a painless test that uses sound waves to create images of your heart. It provides your doctor with information about the size and shape of your heart and how well your heart's chambers and valves are working. This procedure takes approximately one hour. There are no restrictions for this procedure.   Follow-Up: 6-8 WEEKS WITH Hartlyn Reigel, [facility]   Any Other Special Instructions Will Be Listed Below (If Applicable).  If you need a refill on your cardiac medications before your next appointment, please call  your  pharmacy.    Signed, Richardson Dopp, PA-C  11/29/2015 5:31 PM    [facility] [address], [place], [place]  [address] Phone: [phone]; Fax: [phone]

## 2015-11-29 ENCOUNTER — Encounter: Payer: Self-pay | Admitting: Physician Assistant

## 2015-11-29 ENCOUNTER — Ambulatory Visit (INDEPENDENT_AMBULATORY_CARE_PROVIDER_SITE_OTHER): Payer: Medicare Other | Admitting: Physician Assistant

## 2015-11-29 VITALS — BP 136/62 | HR 76 | Ht 62.5 in | Wt 149.1 lb

## 2015-11-29 DIAGNOSIS — R079 Chest pain, unspecified: Secondary | ICD-10-CM | POA: Diagnosis not present

## 2015-11-29 DIAGNOSIS — I447 Left bundle-branch block, unspecified: Secondary | ICD-10-CM | POA: Diagnosis not present

## 2015-11-29 DIAGNOSIS — E876 Hypokalemia: Secondary | ICD-10-CM

## 2015-11-29 DIAGNOSIS — I10 Essential (primary) hypertension: Secondary | ICD-10-CM

## 2015-11-29 DIAGNOSIS — E785 Hyperlipidemia, unspecified: Secondary | ICD-10-CM

## 2015-11-29 LAB — BASIC METABOLIC PANEL
BUN: 13 mg/dL (ref 7–25)
CO2: 28 mmol/L (ref 20–31)
Calcium: 9.9 mg/dL (ref 8.6–10.4)
Chloride: 95 mmol/L — ABNORMAL LOW (ref 98–110)
Creat: 0.86 mg/dL (ref 0.60–0.88)
Glucose, Bld: 95 mg/dL (ref 65–99)
POTASSIUM: 4.3 mmol/L (ref 3.5–5.3)
SODIUM: 132 mmol/L — AB (ref 135–146)

## 2015-11-29 MED ORDER — LOSARTAN POTASSIUM 100 MG PO TABS
100.0000 mg | ORAL_TABLET | Freq: Every day | ORAL | Status: DC
Start: 2015-11-29 — End: 2016-01-24

## 2015-11-29 MED ORDER — NITROGLYCERIN 0.4 MG SL SUBL: 0.4000 mg | SUBLINGUAL_TABLET | SUBLINGUAL | Status: AC | PRN

## 2015-11-29 MED ORDER — PANTOPRAZOLE SODIUM 40 MG PO TBEC: 40.0000 mg | DELAYED_RELEASE_TABLET | ORAL | Status: DC

## 2015-11-29 NOTE — Patient Instructions (Signed)
Medication Instructions:  1. CONTINUE ON LOSARTAN 100 MG DAILY  2. START PROTONIX 40 MG DAILY FOR 1 MONTH; THEN TAKE AS NEEDED  Labwork: TODAY BMET  Testing/Procedures: Your physician has requested that you have an echocardiogram. Echocardiography is a painless test that uses sound waves to create images of your heart. It provides your doctor with information about the size and shape of your heart and how well your heart's chambers and valves are working. This procedure takes approximately one hour. There are no restrictions for this procedure.   Follow-Up: 6-8 WEEKS WITH SCOTT WEAVER, [facility]   Any Other Special Instructions Will Be Listed Below (If Applicable).  If you need a refill on your cardiac medications before your next appointment, please call your pharmacy.

## 2015-11-30 ENCOUNTER — Telehealth: Payer: Self-pay | Admitting: *Deleted

## 2015-11-30 NOTE — Telephone Encounter (Signed)
Pt has been notified of lab results by phone with verbal understanding. 

## 2015-12-04 ENCOUNTER — Other Ambulatory Visit: Payer: Self-pay | Admitting: Family Medicine

## 2015-12-04 DIAGNOSIS — Z1231 Encounter for screening mammogram for malignant neoplasm of breast: Secondary | ICD-10-CM

## 2015-12-10 ENCOUNTER — Other Ambulatory Visit: Payer: Medicare Other

## 2015-12-10 ENCOUNTER — Ambulatory Visit (INDEPENDENT_AMBULATORY_CARE_PROVIDER_SITE_OTHER): Payer: Medicare Other | Admitting: *Deleted

## 2015-12-10 DIAGNOSIS — Z23 Encounter for immunization: Secondary | ICD-10-CM | POA: Diagnosis not present

## 2015-12-10 LAB — CBC WITH DIFFERENTIAL/PLATELET
Basophils Absolute: 0.1 10*3/uL (ref 0.0–0.1)
Basophils Relative: 1 % (ref 0–1)
Eosinophils Absolute: 0.1 10*3/uL (ref 0.0–0.7)
Eosinophils Relative: 2 % (ref 0–5)
HEMATOCRIT: 42.9 % (ref 36.0–46.0)
HEMOGLOBIN: 14.5 g/dL (ref 12.0–15.0)
LYMPHS ABS: 1.5 10*3/uL (ref 0.7–4.0)
Lymphocytes Relative: 28 % (ref 12–46)
MCH: 32 pg (ref 26.0–34.0)
MCHC: 33.8 g/dL (ref 30.0–36.0)
MCV: 94.7 fL (ref 78.0–100.0)
MONOS PCT: 12 % (ref 3–12)
MPV: 9.7 fL (ref 8.6–12.4)
Monocytes Absolute: 0.7 10*3/uL (ref 0.1–1.0)
NEUTROS ABS: 3.1 10*3/uL (ref 1.7–7.7)
NEUTROS PCT: 57 % (ref 43–77)
Platelets: 258 10*3/uL (ref 150–400)
RBC: 4.53 MIL/uL (ref 3.87–5.11)
RDW: 13.9 % (ref 11.5–15.5)
WBC: 5.5 10*3/uL (ref 4.0–10.5)

## 2015-12-10 LAB — COMPLETE METABOLIC PANEL WITH GFR
ALBUMIN: 4.5 g/dL (ref 3.6–5.1)
ALK PHOS: 43 U/L (ref 33–130)
ALT: 27 U/L (ref 6–29)
AST: 27 U/L (ref 10–35)
BILIRUBIN TOTAL: 0.6 mg/dL (ref 0.2–1.2)
BUN: 14 mg/dL (ref 7–25)
CO2: 26 mmol/L (ref 20–31)
Calcium: 9.4 mg/dL (ref 8.6–10.4)
Chloride: 94 mmol/L — ABNORMAL LOW (ref 98–110)
Creat: 0.86 mg/dL (ref 0.60–0.88)
GFR, EST AFRICAN AMERICAN: 73 mL/min (ref >=60)
GFR, EST NON AFRICAN AMERICAN: 63 mL/min (ref >=60)
Glucose, Bld: 89 mg/dL (ref 70–99)
Potassium: 4 mmol/L (ref 3.5–5.3)
Sodium: 132 mmol/L — ABNORMAL LOW (ref 135–146)
TOTAL PROTEIN: 6.6 g/dL (ref 6.1–8.1)

## 2015-12-10 LAB — LIPID PANEL
Cholesterol: 146 mg/dL (ref 125–200)
HDL: 52 mg/dL (ref >=46)
LDL Cholesterol: 73 mg/dL (ref <130)
TRIGLYCERIDES: 105 mg/dL (ref <150)
Total CHOL/HDL Ratio: 2.8 Ratio (ref <=5.0)
VLDL: 21 mg/dL (ref <30)

## 2015-12-10 LAB — TSH: TSH: 0.425 u[IU]/mL (ref 0.350–4.500)

## 2015-12-10 NOTE — Progress Notes (Signed)
Patient ID: Hannah Galvan, female   DOB: 03-Jan-1931, 82 y.o.   MRN: [id number]  Patient seen in office for Pneumonia Vaccination.   Tolerated IM administration well.   Immunization history updated.

## 2015-12-11 ENCOUNTER — Telehealth: Payer: Self-pay | Admitting: *Deleted

## 2015-12-11 ENCOUNTER — Telehealth: Payer: Self-pay | Admitting: Physician Assistant

## 2015-12-11 ENCOUNTER — Encounter: Payer: Self-pay | Admitting: Physician Assistant

## 2015-12-11 ENCOUNTER — Ambulatory Visit (HOSPITAL_COMMUNITY): Payer: Medicare Other | Attending: Cardiology

## 2015-12-11 ENCOUNTER — Other Ambulatory Visit: Payer: Self-pay

## 2015-12-11 DIAGNOSIS — R079 Chest pain, unspecified: Secondary | ICD-10-CM | POA: Diagnosis not present

## 2015-12-11 DIAGNOSIS — I059 Rheumatic mitral valve disease, unspecified: Secondary | ICD-10-CM | POA: Diagnosis not present

## 2015-12-11 DIAGNOSIS — I351 Nonrheumatic aortic (valve) insufficiency: Secondary | ICD-10-CM | POA: Diagnosis not present

## 2015-12-11 DIAGNOSIS — I340 Nonrheumatic mitral (valve) insufficiency: Secondary | ICD-10-CM | POA: Diagnosis not present

## 2015-12-11 DIAGNOSIS — E785 Hyperlipidemia, unspecified: Secondary | ICD-10-CM | POA: Diagnosis not present

## 2015-12-11 DIAGNOSIS — I447 Left bundle-branch block, unspecified: Secondary | ICD-10-CM | POA: Diagnosis not present

## 2015-12-11 DIAGNOSIS — I10 Essential (primary) hypertension: Secondary | ICD-10-CM | POA: Diagnosis not present

## 2015-12-11 NOTE — Telephone Encounter (Signed)
Pt notified of echo results by phone with verbal understanding to results given today.

## 2015-12-11 NOTE — Telephone Encounter (Signed)
I returned pt's call who was cb because she wanted to know more palpitations. I explained to pt what palpitations were and that most of them are beign. I asked pt how her HR was and she ok. I advised pt to try and relax and breath normally. Pt is agreeable to plan of care and thanked me for the time I spent with her explaining about palpitations.

## 2015-12-11 NOTE — Telephone Encounter (Signed)
Pt was called today re normal echo-was told she had palpitations and wants nurse to tell her why she has them and what do do about them

## 2015-12-18 ENCOUNTER — Other Ambulatory Visit: Payer: Self-pay | Admitting: Family Medicine

## 2015-12-19 NOTE — Telephone Encounter (Signed)
?   OK to Refill  

## 2015-12-19 NOTE — Telephone Encounter (Signed)
Patient called requesting her LORazepam (ATIVAN) 1 MG tablet and her folic acid 1 mg called into [facility] on [address]. Her number is [phone]

## 2015-12-20 NOTE — Telephone Encounter (Signed)
Medication called to pharmacy. 

## 2015-12-20 NOTE — Telephone Encounter (Signed)
ok 

## 2015-12-25 ENCOUNTER — Ambulatory Visit
Admission: RE | Admit: 2015-12-25 | Discharge: 2015-12-25 | Disposition: A | Discharge status: Home / Self Care | Payer: Medicare Other | Source: Ambulatory Visit | Attending: Family Medicine | Admitting: Family Medicine

## 2015-12-25 DIAGNOSIS — Z1231 Encounter for screening mammogram for malignant neoplasm of breast: Secondary | ICD-10-CM

## 2015-12-27 ENCOUNTER — Encounter: Payer: Self-pay | Admitting: Family Medicine

## 2015-12-27 ENCOUNTER — Ambulatory Visit (INDEPENDENT_AMBULATORY_CARE_PROVIDER_SITE_OTHER): Payer: Medicare Other | Admitting: Family Medicine

## 2015-12-27 VITALS — BP 122/70 | HR 88 | Temp 98.6°F | Resp 18 | Wt 156.0 lb

## 2015-12-27 DIAGNOSIS — F411 Generalized anxiety disorder: Secondary | ICD-10-CM

## 2015-12-27 NOTE — Progress Notes (Signed)
Subjective:    Patient ID: Hannah Galvan, female    DOB: 1931/10/25, 82 y.o.   MRN: [id number]  HPI 11/23/15 Recently admitted with chest pain and found to have a new LBBB on EKG but troponins were cycled and found to be negative x 3.  Was discharged home with outpatient follow up planned with cards, Dr. Gwenlyn Found.  Upon  Further questioning , the patient reports to daily anxiety attacks. She's been to the hospital several times with chest pain and shortness of breath which she now believes could be anxiety related. In the past she was on numerous medications including Lexapro and Wellbutrin pain medication and Xanax. At that time her son reports that she was overly sedated /overmedicated. They sent her to live with family members in [place] who gradually stopped several of her medicines and the patient improved dramatically. However in [place] she was living with her family and was occupied on a daily basis, eating with, and seeing her family on a daily basis.   She subsequently moved back home this fall after abruptly stopping Lexapro. Since being back home she is living independently. She admits that she is very lonely. Around that same time the panic attacks began happening almost on a daily basis.  Past medical history is significant for breast cancer, hypertension, hyperlipidemia, hypothyroidism status post partial thyroidectomy , an anxiety. She is due for mammogram. The remainder of her preventative care is up-to-date. She had a bone density in 2013 which was normal. It appears that she had Pneumovax 23 in 2013 however I see no documentation of Prevnar 13. This is due but the patient elects not to have this today.  At that time, my plan was: her anxiety is likely multifactorial.   I believe part of it is due to being lonely and being independent away from family. I encouraged the patient to try to become more active such as joining a senior group or volunteering in a local church or school or hospital.  She may even benefit from seeing a therapist. However I believe she would also benefit from being on a preventative medicine. I recommended starting Lexapro 10 mg by mouth daily as a preventative. I believe that she is having fewer panic attacks when she was on the Lexapro. She does not see that she benefited from Wellbutrin and therefore I recommended her discontinuing this medication to avoid polypharmacy. I would like the patient to return fasting to check a CBC, CMP, fasting lipid panel, and a TSH. I will like to recheck the patient in one month. I will also schedule her for screening mammogram.  At that time, my plan was: her anxiety is likely multifactorial.   I believe part of it is due to being lonely and being independent away from family. I encouraged the patient to try to become more active such as joining a senior group or volunteering in a local church or school or hospital. She may even benefit from seeing a therapist. However I believe she would also benefit from being on a preventative medicine. I recommended starting Lexapro 10 mg by mouth daily as a preventative. I believe that she is having fewer panic attacks when she was on the Lexapro. She does not see that she benefited from Wellbutrin and therefore I recommended her discontinuing this medication to avoid polypharmacy. I would like the patient to return fasting to check a CBC, CMP, fasting lipid panel, and a TSH. I will like to recheck the patient in  one month.  12/27/15 Patient states that she see some benefit from the Lexapro 10 mg a day. She is sleeping better at night but she continues to have anxiety attacks in the morning. At the present time she is using one half milligram of Ativan in the morning and 1 mg of Ativan in the evening. She has still not sought out counseling yet. She is not seeing a therapist. Her lab work returned excellent. Her mammogram was normal. Past Medical History  Diagnosis Date  . Thyroid disease   .  Hypertension   . Hyperlipidemia   . Arthritis   . Cancer ([place])     breast, s/p RXT, bilateral  . Hypothyroidism   . Shortness of breath   . Anxiety   . History of stress test     Lexiscan Myoview 8/16:  EF 81%, breast attenuation, no ischemia; Low Risk  . History of echocardiogram     a. Echo 8/16: Vigorous LVF, EF Q000111Q, grade 1 diastolic dysfunction, trivial AI, mild MR, mild RVE, normal RV function, PASP 33 mmHg // b. Echo 1/17 EF 65-70%, normal wall motion, grade 1 diastolic dysfunction, trivial AI, MAC, mild MR  . Allergy   . Blood transfusion without reported diagnosis   . Cataract    Past Surgical History  Procedure Laterality Date  . Abdominal hysterectomy  2008  . Thyroid surgery  2002  . Breast lumpectomy  2000  . Breast lumpectomy  2007  . Cataract extraction  2011  . Rotator cuff repair      right  . Knee surgery      right  . Bladder tact    . Total knee arthroplasty Left 06/02/2013    Dr Rhona Raider  . Total knee arthroplasty Right 06/02/2013    Procedure: RIGHT TOTAL KNEE ARTHROPLASTY;  Surgeon: Hessie Dibble, MD;  Location: [place];  Service: Orthopedics;  Laterality: Right;  . Total knee arthroplasty Left 08/22/2014    Procedure: TOTAL KNEE ARTHROPLASTY;  Surgeon: Hessie Dibble, MD;  Location: [place];  Service: Orthopedics;  Laterality: Left;  . Joint replacement     Current Outpatient Prescriptions on File Prior to Visit  Medication Sig Dispense Refill  . aspirin 81 MG tablet Take 81 mg by mouth daily.    . Calcium Carbonate-Vitamin D (CALCIUM + D) 600-200 MG-UNIT TABS Take 1 tablet by mouth 2 (two) times daily.      . cholecalciferol (VITAMIN D) 1000 UNITS tablet Take 1,000 Units by mouth daily.    . Cranberry 250 MG CAPS Take 250 mg by mouth daily.    . Cyanocobalamin (VITAMIN B-12) 5000 MCG TBDP Take 5,000 mcg by mouth daily. Reported on 11/17/2015    . escitalopram (LEXAPRO) 10 MG tablet Take 1 tablet (10 mg total) by mouth daily. 30 tablet 5  .  exemestane (AROMASIN) 25 MG tablet Take 25 mg by mouth daily after breakfast.    . folic acid (FOLVITE) 1 MG tablet Take 1 mg by mouth daily.    Marland Kitchen levothyroxine (SYNTHROID) 125 MCG tablet Take 1 tablet (125 mcg total) by mouth daily. 90 tablet 1  . LORazepam (ATIVAN) 1 MG tablet TAKE 1 TABLET BY MOUTH IN THE MORNING & 1 TAB 30 MIN PRIOR TO BEDTIME 60 tablet 3  . losartan (COZAAR) 100 MG tablet Take 1 tablet (100 mg total) by mouth daily.    . Multiple Vitamins-Minerals (CENTRUM SILVER ADULT 50+ PO) Take 1 tablet by mouth daily.     Marland Kitchen  nitroGLYCERIN (NITROSTAT) 0.4 MG SL tablet Place 1 tablet (0.4 mg total) under the tongue every 5 (five) minutes as needed for chest pain. 25 tablet 3  . pantoprazole (PROTONIX) 40 MG tablet Take 1 tablet (40 mg total) by mouth as directed. 40 MG DAILY FOR 1 MONTH THEN AS NEEDED 30 tablet 1  . potassium chloride SA (K-DUR,KLOR-CON) 20 MEQ tablet Take 1 tablet (20 mEq total) by mouth daily. 90 tablet 1  . rosuvastatin (CRESTOR) 10 MG tablet Take 1 tablet (10 mg total) by mouth daily at 6 PM. 30 tablet 11   No current facility-administered medications on file prior to visit.   Allergies  Allergen Reactions  . Ambien [Zolpidem]     confusion  . Pyridium [Phenazopyridine Hcl] Other (See Comments)    hemolysis  . Sulfa Antibiotics Rash   Social History   Social History  . Marital Status: Widowed    Spouse Name: N/A  . Number of Children: N/A  . Years of Education: N/A   Occupational History  . Not on file.   Social History Main Topics  . Smoking status: Never Smoker   . Smokeless tobacco: Never Used     Comment: never used tobacco  . Alcohol Use: No  . Drug Use: No  . Sexual Activity: Not Currently   Other Topics Concern  . Not on file   Social History Narrative      Review of Systems  All other systems reviewed and are negative.      Objective:   Physical Exam  Constitutional: She is oriented to person, place, and time. She appears  well-developed and well-nourished. No distress.  HENT:  Head: Normocephalic and atraumatic.  Right Ear: External ear normal.  Left Ear: External ear normal.  Nose: Nose normal.  Mouth/Throat: Oropharynx is clear and moist. No oropharyngeal exudate.  Eyes: Conjunctivae and EOM are normal. No scleral icterus.  Neck: Neck supple. No JVD present. No thyromegaly present.  Cardiovascular: Normal rate and regular rhythm.   Murmur heard. Pulmonary/Chest: Effort normal and breath sounds normal. No respiratory distress. She has no wheezes. She has no rales.  Abdominal: Soft. Bowel sounds are normal. She exhibits no distension. There is no tenderness. There is no rebound and no guarding.  Musculoskeletal: She exhibits no edema or tenderness.  Lymphadenopathy:    She has no cervical adenopathy.  Neurological: She is alert and oriented to person, place, and time. She has normal reflexes. No cranial nerve deficit. She exhibits normal muscle tone. Coordination normal.  Skin: She is not diaphoretic.  Psychiatric: She has a normal mood and affect. Her behavior is normal. Judgment and thought content normal.  Vitals reviewed.         Assessment & Plan:  GAD (generalized anxiety disorder)  I'm glad the patient is doing better on the Lexapro. However I again encouraged her to seek counseling. I believe this can help with anxiety as well. I mentioned hospice grief counseling versus private therapist. She states that she will contact and schedule this on her own. Her lab work and mammogram are normal. I will make no changes in the Lexapro at this time. However I did recommend that she take 1 mg of Ativan in the morning and 1 mg of Ativan in the evening. Recheck in 4-6 weeks or sooner if worse

## 2016-01-10 ENCOUNTER — Ambulatory Visit (INDEPENDENT_AMBULATORY_CARE_PROVIDER_SITE_OTHER): Payer: Medicare Other | Admitting: Podiatry

## 2016-01-10 ENCOUNTER — Encounter: Payer: Self-pay | Admitting: Podiatry

## 2016-01-10 ENCOUNTER — Ambulatory Visit (INDEPENDENT_AMBULATORY_CARE_PROVIDER_SITE_OTHER): Payer: Medicare Other

## 2016-01-10 VITALS — Resp 16

## 2016-01-10 DIAGNOSIS — B351 Tinea unguium: Secondary | ICD-10-CM | POA: Diagnosis not present

## 2016-01-10 DIAGNOSIS — M79675 Pain in left toe(s): Secondary | ICD-10-CM

## 2016-01-10 DIAGNOSIS — M79674 Pain in right toe(s): Secondary | ICD-10-CM

## 2016-01-10 DIAGNOSIS — M2041 Other hammer toe(s) (acquired), right foot: Secondary | ICD-10-CM | POA: Diagnosis not present

## 2016-01-10 DIAGNOSIS — L84 Corns and callosities: Secondary | ICD-10-CM | POA: Diagnosis not present

## 2016-01-10 DIAGNOSIS — M79604 Pain in right leg: Secondary | ICD-10-CM

## 2016-01-10 DIAGNOSIS — M79605 Pain in left leg: Secondary | ICD-10-CM

## 2016-01-10 NOTE — Progress Notes (Signed)
Subjective:     Patient ID: Hannah Galvan, female   DOB: May 23, 1931, 83 y.o.   MRN: [id number]  HPI patient states that had a lot of pain with this nail and my right big toe and a corn on the right fifth toe that sore with a rotated type sore   Review of Systems     Objective:   Physical Exam Neurovascular status intact with good digital perfusion is noted to have incurvated nail bed right hallux mostly on the medial border but also across the dorsal surface and keratotic lesion with rotated fifth digit right fifth toe    Assessment:     Probable ingrown toenail deformity right hallux with discomfort across the medial and also some pain on central border along with keratotic lesion right fifth digit with hammertoe deformity    Plan:     H&P and reviewed hammertoe and possibility for correction the future. Debrided the nailbed on the hallux which is very painful today and debrided the lesion fifth digit right which was tolerated well and reappoint to recheck when symptomatically and may require more aggressive treatment pattern

## 2016-01-15 NOTE — Addendum Note (Signed)
Addended by: Jenna Luo on: 01/15/2016 08:56 AM   Modules accepted: Level of Service

## 2016-01-23 NOTE — Progress Notes (Signed)
Cardiology Office Note:    Date:  01/24/2016   ID:  Hannah Galvan, DOB October 31, 1931, MRN [id number]  PCP:  [name], MD  Cardiologist:  Dr. Darlin Coco [facility], PA-C) >> patient requests Dr. Quay Burow  Electrophysiologist:  n/a  Chief Complaint  Patient presents with  . Follow-up    HTN, Chest pain    History of Present Illness:     Hannah Galvan is a 82 y.o. female with a hx of HTN, HL, hypothyroidism, palpitations, breast CA (R in 2000, L in 2007), HL (intol to Crestor), mild AI, mild MR.   She was seen in 8/16 with complaints of rapid palpitations and DOE and CP. I placed her on Toprol-XL 12.5 mg QD. Echo, Myoview and Event Monitor were arranged. Myoview was low risk with no ischemia and normal LVF. Echo demonstrated vigorous LVF, mild diastolic dysfunction and PASP 33 mmHg. Event monitor demonstrated NSR, PACs and an episode of possible PAF with CVR. Dr. Mare Ferrari placed her on Apixaban as her CHADS2-VASc=4. Her monitor was reviewed further by Dr. Mare Ferrari and the episode in question that demonstrated atrial fibrillation was felt to be artifact and frequent PACs. It was recommended that she stop Eliquis and return to taking baby aspirin daily. Toprol-XL was recommended to take to help with palpitations.   She was admitted 12/16 with chest pain. Cardiac enzymes remained normal. She was noted to have a new LBBB. HCTZ was stopped secondary to hypokalemia. I saw her last 11/29/15. Given her recent admission for chest pain and LBBB, I set her up for an echocardiogram. This demonstrated normal LV function. She returns for follow-up.    Here alone. She is overall doing well. Denies chest pain. She has chronic dyspnea and exertion. This is unchanged. She is NYHA 2b. She has an occasional fluttering in her chest. She denies syncope. Denies orthopnea, PND. She has mild ankle edema. She does note decreased energy and fatigue.   Past Medical History  Diagnosis Date   . Thyroid disease   . Hypertension   . Hyperlipidemia   . Arthritis   . Cancer ([place])     breast, s/p RXT, bilateral  . Hypothyroidism   . Shortness of breath   . Anxiety   . History of stress test     Lexiscan Myoview 8/16:  EF 81%, breast attenuation, no ischemia; Low Risk  . History of echocardiogram     a. Echo 8/16: Vigorous LVF, EF Q000111Q, grade 1 diastolic dysfunction, trivial AI, mild MR, mild RVE, normal RV function, PASP 33 mmHg // b. Echo 1/17 EF 65-70%, normal wall motion, grade 1 diastolic dysfunction, trivial AI, MAC, mild MR  . Allergy   . Blood transfusion without reported diagnosis   . Cataract     Past Surgical History  Procedure Laterality Date  . Abdominal hysterectomy  2008  . Thyroid surgery  2002  . Breast lumpectomy  2000  . Breast lumpectomy  2007  . Cataract extraction  2011  . Rotator cuff repair      right  . Knee surgery      right  . Bladder tact    . Total knee arthroplasty Left 06/02/2013    Dr Rhona Raider  . Total knee arthroplasty Right 06/02/2013    Procedure: RIGHT TOTAL KNEE ARTHROPLASTY;  Surgeon: Hessie Dibble, MD;  Location: [place];  Service: Orthopedics;  Laterality: Right;  . Total knee arthroplasty Left 08/22/2014    Procedure: TOTAL KNEE ARTHROPLASTY;  Surgeon: Hessie Dibble, MD;  Location: [place];  Service: Orthopedics;  Laterality: Left;  . Joint replacement      Current Medications: Outpatient Prescriptions Prior to Visit  Medication Sig Dispense Refill  . aspirin 81 MG tablet Take 81 mg by mouth daily.    . Calcium Carbonate-Vitamin D (CALCIUM + D) 600-200 MG-UNIT TABS Take 1 tablet by mouth 2 (two) times daily.      . Cranberry 250 MG CAPS Take 250 mg by mouth daily.    Marland Kitchen diltiazem (CARDIZEM) 30 MG tablet Take 30 mg by mouth daily as needed. Take as needed for palpitations    . escitalopram (LEXAPRO) 10 MG tablet Take 1 tablet (10 mg total) by mouth daily. 30 tablet 5  . exemestane (AROMASIN) 25 MG tablet Take 25 mg by  mouth daily after breakfast.    . folic acid (FOLVITE) 1 MG tablet Take 1 mg by mouth daily.    Marland Kitchen levothyroxine (SYNTHROID) 125 MCG tablet Take 1 tablet (125 mcg total) by mouth daily. 90 tablet 1  . LORazepam (ATIVAN) 1 MG tablet TAKE 1 TABLET BY MOUTH IN THE MORNING & 1 TAB 30 MIN PRIOR TO BEDTIME 60 tablet 3  . Multiple Vitamins-Minerals (CENTRUM SILVER ADULT 50+ PO) Take 1 tablet by mouth daily.     . nitroGLYCERIN (NITROSTAT) 0.4 MG SL tablet Place 1 tablet (0.4 mg total) under the tongue every 5 (five) minutes as needed for chest pain. 25 tablet 3  . pantoprazole (PROTONIX) 40 MG tablet Take 1 tablet (40 mg total) by mouth as directed. 40 MG DAILY FOR 1 MONTH THEN AS NEEDED 30 tablet 1  . potassium chloride SA (K-DUR,KLOR-CON) 20 MEQ tablet Take 1 tablet (20 mEq total) by mouth daily. 90 tablet 1  . rosuvastatin (CRESTOR) 10 MG tablet Take 1 tablet (10 mg total) by mouth daily at 6 PM. 30 tablet 11  . cholecalciferol (VITAMIN D) 1000 UNITS tablet Take 1,000 Units by mouth daily.    . Cyanocobalamin (VITAMIN B-12) 5000 MCG TBDP Take 5,000 mcg by mouth daily. Reported on 11/17/2015    . losartan (COZAAR) 100 MG tablet Take 1 tablet (100 mg total) by mouth daily.     No facility-administered medications prior to visit.     Allergies:   Ambien; Pyridium; and Sulfa antibiotics   Social History   Social History  . Marital Status: Widowed    Spouse Name: N/A  . Number of Children: N/A  . Years of Education: N/A   Social History Main Topics  . Smoking status: Never Smoker   . Smokeless tobacco: Never Used     Comment: never used tobacco  . Alcohol Use: No  . Drug Use: No  . Sexual Activity: Not Currently   Other Topics Concern  . None   Social History Narrative     Family History:  The patient's family history includes Cancer in her brother, brother, brother, brother, brother, father, sister, sister, and sister; Heart disease in her sister; Hypertension in her mother; Stroke in  her mother.   ROS:   Please see the history of present illness.    Review of Systems  Cardiovascular: Positive for dyspnea on exertion.  Hematologic/Lymphatic: Bruises/bleeds easily.  Musculoskeletal: Positive for back pain.  All other systems reviewed and are negative.   Physical Exam:    VS:  BP 118/64 mmHg  Pulse 84  Ht 5' 2.5" (1.588 m)  Wt 156 lb (70.761 kg)  BMI 28.06 kg/m2  GEN: Well nourished, well developed, in no acute distress HEENT: normal Neck: no JVD, no masses Cardiac: Normal S1/S2, RRR; no murmurs, trace bilat ankle edema   Respiratory:  clear to auscultation bilaterally; no wheezing, rhonchi or rales GI: soft, nontender  MS: no deformity or atrophy Skin: warm and dry  Neuro:   no focal deficits  Psych: Alert and oriented x 3, normal affect  Wt Readings from Last 3 Encounters:  01/24/16 156 lb (70.761 kg)  12/27/15 156 lb (70.761 kg)  11/29/15 149 lb 1.9 oz (67.64 kg)      Studies/Labs Reviewed:     EKG:  EKG is  ordered today.  The ekg ordered today demonstrates n/a  Recent Labs: 06/28/2015: Pro B Natriuretic peptide (BNP) 44.0 12/10/2015: ALT 27; BUN 14; Creat 0.86; Hemoglobin 14.5; Platelets 258; Potassium 4.0; Sodium 132*; TSH 0.425   Recent Lipid Panel    Component Value Date/Time   CHOL 146 12/10/2015 0934   TRIG 105 12/10/2015 0934   HDL 52 12/10/2015 0934   CHOLHDL 2.8 12/10/2015 0934   VLDL 21 12/10/2015 0934   LDLCALC 73 12/10/2015 0934    Additional studies/ records that were reviewed today include:   Echo 12/11/15 Vigorous LVF, EF 65-70%, normal wall motion, grade 1 diastolic dysfunction, trivial AI, MAC, mild MR  Event monitor 8/16 NSR with frequent PACs No atrial fibrillation  Myoview 8/16 EF 81%, apical defect most likely consistent with breast attenuation, no ischemia, low risk  Echo 07/09/15 Vigorous LVF, EF Q000111Q, grade 1 diastolic dysfunction, trivial AI, MAC, mild MR, mild RVE, normal RV function, PASP 33  mmHg  Holter 11/2014 NSR, PACs  Echo 06/2012 EF 60-65%, normal wall motion, grade 1 diastolic dysfunction, trivial AI, mild MR, mild SAM, PASP 36 mmHg  Event Monitor 07/2012 NSR, PACs, PVCs  Carotid US 10/12 No significant carotid bifurcation plaque or stenosis.   ASSESSMENT:     1. Other fatigue   2. Chest pain, unspecified chest pain type   3. Essential hypertension, benign   4. LBBB (left bundle branch block)   5. Hyperlipidemia     PLAN:     In order of problems listed above:  1. Fatigue - BP is on the low side.  Will decrease Losartan to 50 mg QD.  Check CBC.  2. Chest Pain - No recurrence. She had a negative Myoview in 8/16. Recent admission to the hospital is negative cardiac enzymes. FU echo normal.  3. HTN - Controlled.  Adjust medications as noted.   4. LBBB - Transient. Recent echo with normal LV function..  5. HL - Continue statin.   Medication Adjustments/Labs and Tests Ordered: Current medicines are reviewed at length with the patient today.  Concerns regarding medicines are outlined above.  Medication changes, Labs and Tests ordered today are outlined in the Patient Instructions noted below. Patient Instructions  Medication Instructions:  1. DECREASE LOSARTAN TO 50 MG DAILY; NEW RX  2. ONLY TAKE THE CARDIZEM 30 MG DAILY AS NEEDED FOR PALPITATIONS  Labwork: TODAY CBC W/DIFF  Testing/Procedures: NONE  Follow-Up: Your physician wants you to follow-up in: [place].  You will receive a reminder letter in the mail two months in advance. If you don't receive a letter, please call our office to schedule the follow-up appointment.  Any Other Special Instructions Will Be Listed Below (If Applicable).  KEEP LEGS  ELEVATED AND MAKE SURE TO WEAR COMPRESSION STOCKINGS   If you need a refill on your  cardiac medications before your next appointment, please call your pharmacy.   Signed, Richardson Dopp, PA-C  01/24/2016 4:40 PM    [facility] [address], [place], [place]  [address] Phone: [phone]; Fax: [phone]

## 2016-01-24 ENCOUNTER — Ambulatory Visit (INDEPENDENT_AMBULATORY_CARE_PROVIDER_SITE_OTHER): Payer: Medicare Other | Admitting: Physician Assistant

## 2016-01-24 ENCOUNTER — Encounter: Payer: Self-pay | Admitting: Physician Assistant

## 2016-01-24 VITALS — BP 118/64 | HR 84 | Ht 62.5 in | Wt 156.0 lb

## 2016-01-24 DIAGNOSIS — R079 Chest pain, unspecified: Secondary | ICD-10-CM

## 2016-01-24 DIAGNOSIS — I10 Essential (primary) hypertension: Secondary | ICD-10-CM

## 2016-01-24 DIAGNOSIS — I447 Left bundle-branch block, unspecified: Secondary | ICD-10-CM | POA: Diagnosis not present

## 2016-01-24 DIAGNOSIS — E785 Hyperlipidemia, unspecified: Secondary | ICD-10-CM

## 2016-01-24 DIAGNOSIS — R5383 Other fatigue: Secondary | ICD-10-CM

## 2016-01-24 LAB — CBC WITH DIFFERENTIAL/PLATELET
BASOS ABS: 0.1 10*3/uL (ref 0.0–0.1)
Basophils Relative: 1 % (ref 0–1)
EOS ABS: 0.1 10*3/uL (ref 0.0–0.7)
Eosinophils Relative: 2 % (ref 0–5)
HEMATOCRIT: 40.3 % (ref 36.0–46.0)
HEMOGLOBIN: 13.6 g/dL (ref 12.0–15.0)
LYMPHS ABS: 2.6 10*3/uL (ref 0.7–4.0)
LYMPHS PCT: 38 % (ref 12–46)
MCH: 31.8 pg (ref 26.0–34.0)
MCHC: 33.7 g/dL (ref 30.0–36.0)
MCV: 94.2 fL (ref 78.0–100.0)
MONOS PCT: 10 % (ref 3–12)
MPV: 10.8 fL (ref 8.6–12.4)
Monocytes Absolute: 0.7 10*3/uL (ref 0.1–1.0)
NEUTROS PCT: 49 % (ref 43–77)
Neutro Abs: 3.3 10*3/uL (ref 1.7–7.7)
Platelets: 228 10*3/uL (ref 150–400)
RBC: 4.28 MIL/uL (ref 3.87–5.11)
RDW: 13.3 % (ref 11.5–15.5)
WBC: 6.8 10*3/uL (ref 4.0–10.5)

## 2016-01-24 MED ORDER — LOSARTAN POTASSIUM 50 MG PO TABS
50.0000 mg | ORAL_TABLET | Freq: Every day | ORAL | Status: DC
Start: 2016-01-24 — End: 2016-04-03

## 2016-01-24 NOTE — Patient Instructions (Addendum)
Medication Instructions:  1. DECREASE LOSARTAN TO 50 MG DAILY; NEW RX  2. ONLY TAKE THE CARDIZEM 30 MG DAILY AS NEEDED FOR PALPITATIONS  Labwork: TODAY CBC W/DIFF  Testing/Procedures: NONE  Follow-Up: Your physician wants you to follow-up in: [place].  You will receive a reminder letter in the mail two months in advance. If you don't receive a letter, please call our office to schedule the follow-up appointment.  Any Other Special Instructions Will Be Listed Below (If Applicable).  KEEP LEGS  ELEVATED AND MAKE SURE TO WEAR COMPRESSION STOCKINGS   If you need a refill on your cardiac medications before your next appointment, please call your pharmacy.

## 2016-01-25 ENCOUNTER — Telehealth: Payer: Self-pay | Admitting: Physician Assistant

## 2016-01-25 NOTE — Telephone Encounter (Signed)
Pt returning call from today,she says it mght be about her lab results.

## 2016-01-25 NOTE — Telephone Encounter (Signed)
**Note De-Identified  Obfuscation** The pt is advised of her CBC results and she verbalized understanding.

## 2016-01-27 ENCOUNTER — Other Ambulatory Visit: Payer: Self-pay | Admitting: Physician Assistant

## 2016-01-29 ENCOUNTER — Other Ambulatory Visit: Payer: Self-pay

## 2016-01-29 DIAGNOSIS — R079 Chest pain, unspecified: Secondary | ICD-10-CM

## 2016-01-29 MED ORDER — PANTOPRAZOLE SODIUM 40 MG PO TBEC: 40.0000 mg | DELAYED_RELEASE_TABLET | ORAL | Status: DC

## 2016-02-21 ENCOUNTER — Other Ambulatory Visit (HOSPITAL_COMMUNITY): Payer: Self-pay | Admitting: Orthopaedic Surgery

## 2016-02-21 DIAGNOSIS — Z96653 Presence of artificial knee joint, bilateral: Secondary | ICD-10-CM

## 2016-02-27 ENCOUNTER — Ambulatory Visit (HOSPITAL_COMMUNITY)
Admission: RE | Admit: 2016-02-27 | Discharge: 2016-02-27 | Disposition: A | Discharge status: Home / Self Care | Payer: Medicare Other | Source: Ambulatory Visit | Attending: Orthopaedic Surgery | Admitting: Orthopaedic Surgery

## 2016-02-27 DIAGNOSIS — Z96653 Presence of artificial knee joint, bilateral: Secondary | ICD-10-CM | POA: Diagnosis not present

## 2016-02-27 DIAGNOSIS — M25562 Pain in left knee: Secondary | ICD-10-CM | POA: Insufficient documentation

## 2016-02-27 DIAGNOSIS — M25561 Pain in right knee: Secondary | ICD-10-CM | POA: Diagnosis present

## 2016-02-27 MED ORDER — TECHNETIUM TC 99M MEDRONATE IV KIT
25.0000 | PACK | Freq: Once | INTRAVENOUS | Status: AC | PRN
  Administered 2016-02-27: 25 via INTRAVENOUS

## 2016-02-28 ENCOUNTER — Encounter (HOSPITAL_COMMUNITY): Payer: Medicare Other

## 2016-02-28 ENCOUNTER — Ambulatory Visit (HOSPITAL_COMMUNITY): Payer: Medicare Other

## 2016-03-03 ENCOUNTER — Ambulatory Visit (INDEPENDENT_AMBULATORY_CARE_PROVIDER_SITE_OTHER): Payer: Medicare Other | Admitting: Family Medicine

## 2016-03-03 ENCOUNTER — Encounter: Payer: Self-pay | Admitting: Family Medicine

## 2016-03-03 VITALS — BP 126/68 | HR 62 | Temp 98.1°F | Resp 16 | Wt 154.0 lb

## 2016-03-03 DIAGNOSIS — Z91048 Other nonmedicinal substance allergy status: Secondary | ICD-10-CM | POA: Diagnosis not present

## 2016-03-03 DIAGNOSIS — Z9109 Other allergy status, other than to drugs and biological substances: Secondary | ICD-10-CM

## 2016-03-03 MED ORDER — FLUTICASONE PROPIONATE 50 MCG/ACT NA SUSP: 2.0000 | Freq: Every day | NASAL | Status: DC

## 2016-03-03 MED ORDER — CETIRIZINE HCL 10 MG PO TABS: 10.0000 mg | ORAL_TABLET | Freq: Every day | ORAL | Status: DC

## 2016-03-03 NOTE — Progress Notes (Signed)
Subjective:    Patient ID: Hannah Galvan, female    DOB: 07-11-1931, 82 y.o.   MRN: [id number]  HPI The patient has had one week of head congestion, frontal sinus pressure, postnasal drip, chest congestion, and dry nonproductive cough. She denies any shortness of breath. She denies any wheezing. She denies any chest pain. She denies any fever. She denies feeling ill. The majority of her symptoms seem to be sinus related and more of a nuisance rather than an illness Past Medical History  Diagnosis Date  . Thyroid disease   . Hypertension   . Hyperlipidemia   . Arthritis   . Cancer ([place])     breast, s/p RXT, bilateral  . Hypothyroidism   . Shortness of breath   . Anxiety   . History of stress test     Lexiscan Myoview 8/16:  EF 81%, breast attenuation, no ischemia; Low Risk  . History of echocardiogram     a. Echo 8/16: Vigorous LVF, EF Q000111Q, grade 1 diastolic dysfunction, trivial AI, mild MR, mild RVE, normal RV function, PASP 33 mmHg // b. Echo 1/17 EF 65-70%, normal wall motion, grade 1 diastolic dysfunction, trivial AI, MAC, mild MR  . Allergy   . Blood transfusion without reported diagnosis   . Cataract    Past Surgical History  Procedure Laterality Date  . Abdominal hysterectomy  2008  . Thyroid surgery  2002  . Breast lumpectomy  2000  . Breast lumpectomy  2007  . Cataract extraction  2011  . Rotator cuff repair      right  . Knee surgery      right  . Bladder tact    . Total knee arthroplasty Left 06/02/2013    Dr Rhona Raider  . Total knee arthroplasty Right 06/02/2013    Procedure: RIGHT TOTAL KNEE ARTHROPLASTY;  Surgeon: Hessie Dibble, MD;  Location: [place];  Service: Orthopedics;  Laterality: Right;  . Total knee arthroplasty Left 08/22/2014    Procedure: TOTAL KNEE ARTHROPLASTY;  Surgeon: Hessie Dibble, MD;  Location: [place];  Service: Orthopedics;  Laterality: Left;  . Joint replacement     Current Outpatient Prescriptions on File Prior to Visit  Medication  Sig Dispense Refill  . aspirin 81 MG tablet Take 81 mg by mouth daily.    . Calcium Carbonate-Vitamin D (CALCIUM + D) 600-200 MG-UNIT TABS Take 1 tablet by mouth 2 (two) times daily.      . Cranberry 250 MG CAPS Take 250 mg by mouth daily.    Marland Kitchen diltiazem (CARDIZEM) 30 MG tablet Take 30 mg by mouth daily as needed. Take as needed for palpitations    . escitalopram (LEXAPRO) 10 MG tablet Take 1 tablet (10 mg total) by mouth daily. 30 tablet 5  . exemestane (AROMASIN) 25 MG tablet Take 25 mg by mouth daily after breakfast.    . folic acid (FOLVITE) 1 MG tablet Take 1 mg by mouth daily.    Marland Kitchen levothyroxine (SYNTHROID) 125 MCG tablet Take 1 tablet (125 mcg total) by mouth daily. 90 tablet 1  . LORazepam (ATIVAN) 1 MG tablet TAKE 1 TABLET BY MOUTH IN THE MORNING & 1 TAB 30 MIN PRIOR TO BEDTIME 60 tablet 3  . losartan (COZAAR) 50 MG tablet Take 1 tablet (50 mg total) by mouth daily. 30 tablet 11  . Multiple Vitamins-Minerals (CENTRUM SILVER ADULT 50+ PO) Take 1 tablet by mouth daily.     . nitroGLYCERIN (NITROSTAT) 0.4 MG SL tablet  Place 1 tablet (0.4 mg total) under the tongue every 5 (five) minutes as needed for chest pain. 25 tablet 3  . pantoprazole (PROTONIX) 40 MG tablet Take 1 tablet (40 mg total) by mouth as directed. 40 MG DAILY FOR 1 MONTH THEN AS NEEDED 30 tablet 3  . potassium chloride SA (K-DUR,KLOR-CON) 20 MEQ tablet Take 1 tablet (20 mEq total) by mouth daily. 90 tablet 1  . rosuvastatin (CRESTOR) 10 MG tablet Take 1 tablet (10 mg total) by mouth daily at 6 PM. 30 tablet 11   No current facility-administered medications on file prior to visit.   Allergies  Allergen Reactions  . Ambien [Zolpidem]     confusion  . Pyridium [Phenazopyridine Hcl] Other (See Comments)    hemolysis  . Sulfa Antibiotics Rash   Social History   Social History  . Marital Status: Widowed    Spouse Name: N/A  . Number of Children: N/A  . Years of Education: N/A   Occupational History  . Not on file.     Social History Main Topics  . Smoking status: Never Smoker   . Smokeless tobacco: Never Used     Comment: never used tobacco  . Alcohol Use: No  . Drug Use: No  . Sexual Activity: Not Currently   Other Topics Concern  . Not on file   Social History Narrative      Review of Systems  All other systems reviewed and are negative.      Objective:   Physical Exam  Constitutional: She appears well-developed and well-nourished.  HENT:  Right Ear: Tympanic membrane, external ear and ear canal normal.  Left Ear: Tympanic membrane, external ear and ear canal normal.  Nose: Mucosal edema and rhinorrhea present. Right sinus exhibits frontal sinus tenderness. Left sinus exhibits frontal sinus tenderness.  Mouth/Throat: Oropharynx is clear and moist. No oropharyngeal exudate.  Eyes: Conjunctivae are normal.  Neck: Neck supple.  Cardiovascular: Normal rate, regular rhythm and normal heart sounds.   Pulmonary/Chest: Effort normal and breath sounds normal. No respiratory distress. She has no wheezes. She has no rales.  Lymphadenopathy:    She has no cervical adenopathy.  Vitals reviewed.         Assessment & Plan:  Environmental allergies - Plan: fluticasone (FLONASE) 50 MCG/ACT nasal spray, cetirizine (ZYRTEC) 10 MG tablet  I believe the patient is suffering with seasonal allergies and some mild sinusitis with postnasal drip triggering a cough. Begin Flonase 2 sprays each nostril daily in addition to Zyrtec 10 mg by mouth daily. Use Mucinex for chest congestion. Recheck if no better in 1 week or sooner if worse.

## 2016-03-07 ENCOUNTER — Telehealth: Payer: Self-pay | Admitting: Family Medicine

## 2016-03-07 MED ORDER — BENZONATATE 200 MG PO CAPS: 200.0000 mg | ORAL_CAPSULE | Freq: Three times a day (TID) | ORAL | Status: DC

## 2016-03-07 NOTE — Telephone Encounter (Signed)
Patient is still coughing a lot from her last visit with dr pickard, can cough med be called in for her? [facility]

## 2016-03-07 NOTE — Telephone Encounter (Signed)
Tessalon perles 200 mg poq 8 hrs prn cough. (30) 

## 2016-03-07 NOTE — Telephone Encounter (Signed)
Medication called/sent to requested pharmacy and pt aware 

## 2016-04-03 ENCOUNTER — Ambulatory Visit (INDEPENDENT_AMBULATORY_CARE_PROVIDER_SITE_OTHER): Payer: Medicare Other | Admitting: Family Medicine

## 2016-04-03 ENCOUNTER — Encounter: Payer: Self-pay | Admitting: Family Medicine

## 2016-04-03 VITALS — BP 160/82 | HR 80 | Temp 98.1°F | Resp 16 | Wt 156.0 lb

## 2016-04-03 DIAGNOSIS — I10 Essential (primary) hypertension: Secondary | ICD-10-CM | POA: Diagnosis not present

## 2016-04-03 DIAGNOSIS — F411 Generalized anxiety disorder: Secondary | ICD-10-CM | POA: Diagnosis not present

## 2016-04-03 DIAGNOSIS — R5382 Chronic fatigue, unspecified: Secondary | ICD-10-CM

## 2016-04-03 DIAGNOSIS — R079 Chest pain, unspecified: Secondary | ICD-10-CM | POA: Diagnosis not present

## 2016-04-03 MED ORDER — LEVOTHYROXINE SODIUM 125 MCG PO TABS: 125.0000 ug | ORAL_TABLET | Freq: Every day | ORAL | Status: DC

## 2016-04-03 MED ORDER — LOSARTAN POTASSIUM 50 MG PO TABS: 50.0000 mg | ORAL_TABLET | Freq: Every day | ORAL | Status: DC

## 2016-04-03 MED ORDER — ESCITALOPRAM OXALATE 10 MG PO TABS: 10.0000 mg | ORAL_TABLET | Freq: Every day | ORAL | Status: DC

## 2016-04-03 MED ORDER — DILTIAZEM HCL 30 MG PO TABS: 30.0000 mg | ORAL_TABLET | Freq: Every day | ORAL | Status: DC | PRN

## 2016-04-03 MED ORDER — PANTOPRAZOLE SODIUM 40 MG PO TBEC: 40.0000 mg | DELAYED_RELEASE_TABLET | ORAL | Status: DC

## 2016-04-03 MED ORDER — ROSUVASTATIN CALCIUM 10 MG PO TABS: 10.0000 mg | ORAL_TABLET | Freq: Every day | ORAL | Status: DC

## 2016-04-03 NOTE — Progress Notes (Signed)
Subjective:    Patient ID: Hannah Galvan, female    DOB: [birth year], 82 y.o.   MRN: [id number]  HPI 11/23/15 Recently admitted with chest pain and found to have a new LBBB on EKG but troponins were cycled and found to be negative x 3.  Was discharged home with outpatient follow up planned with cards, Dr. Gwenlyn Found.  Upon  Further questioning , the patient reports to daily anxiety attacks. She's been to the hospital several times with chest pain and shortness of breath which she now believes could be anxiety related. In the past she was on numerous medications including Lexapro and Wellbutrin pain medication and Xanax. At that time her son reports that she was overly sedated /overmedicated. They sent her to live with family members in [place] who gradually stopped several of her medicines and the patient improved dramatically. However in [place] she was living with her family and was occupied on a daily basis, eating with, and seeing her family on a daily basis.   She subsequently moved back home this fall after abruptly stopping Lexapro. Since being back home she is living independently. She admits that she is very lonely. Around that same time the panic attacks began happening almost on a daily basis.  Past medical history is significant for breast cancer, hypertension, hyperlipidemia, hypothyroidism status post partial thyroidectomy , an anxiety. She is due for mammogram. The remainder of her preventative care is up-to-date. She had a bone density in 2013 which was normal. It appears that she had Pneumovax 23 in 2013 however I see no documentation of Prevnar 13. This is due but the patient elects not to have this today.  At that time, my plan was: her anxiety is likely multifactorial.   I believe part of it is due to being lonely and being independent away from family. I encouraged the patient to try to become more active such as joining a senior group or volunteering in a local church or school or hospital.  She may even benefit from seeing a therapist. However I believe she would also benefit from being on a preventative medicine. I recommended starting Lexapro 10 mg by mouth daily as a preventative. I believe that she is having fewer panic attacks when she was on the Lexapro. She does not see that she benefited from Wellbutrin and therefore I recommended her discontinuing this medication to avoid polypharmacy. I would like the patient to return fasting to check a CBC, CMP, fasting lipid panel, and a TSH. I will like to recheck the patient in one month. I will also schedule her for screening mammogram.  At that time, my plan was: her anxiety is likely multifactorial.   I believe part of it is due to being lonely and being independent away from family. I encouraged the patient to try to become more active such as joining a senior group or volunteering in a local church or school or hospital. She may even benefit from seeing a therapist. However I believe she would also benefit from being on a preventative medicine. I recommended starting Lexapro 10 mg by mouth daily as a preventative. I believe that she is having fewer panic attacks when she was on the Lexapro. She does not see that she benefited from Wellbutrin and therefore I recommended her discontinuing this medication to avoid polypharmacy. I would like the patient to return fasting to check a CBC, CMP, fasting lipid panel, and a TSH. I will like to recheck the patient in  one month.  12/27/15 Patient states that she see some benefit from the Lexapro 10 mg a day. She is sleeping better at night but she continues to have anxiety attacks in the morning. At the present time she is using one half milligram of Ativan in the morning and 1 mg of Ativan in the evening. She has still not sought out counseling yet. She is not seeing a therapist. Her lab work returned excellent. Her mammogram was normal.  At that time, my plan was: I'm glad the patient is doing better on  the Lexapro. However I again encouraged her to seek counseling. I believe this can help with anxiety as well. I mentioned hospice grief counseling versus private therapist. She states that she will contact and schedule this on her own. Her lab work and mammogram are normal. I will make no changes in the Lexapro at this time. However I did recommend that she take 1 mg of Ativan in the morning and 1 mg of Ativan in the evening. Recheck in 4-6 weeks or sooner if worse  04/03/16 Patient is here today complaining of fatigue. She has no energy. She always feels tired. She denies feeling sleepy. She states that she sleeps well for at least 7 hours every evening. She also reports a dull headache is located in her for head which sounds like a tension headache. She denies any weight loss. In fact her weight is unchanged since February. She denies any fevers or chills or recent illness. She denies any cough, shortness of breath, chest pain, dyspnea on exertion. She denies any nausea vomiting or diarrhea. She denies any blood in her stool. She denies any dysuria or hematuria. She denies any bleeding or bruising. Was seen by the cardiologist in March complaining of fatigue and a decrease the dose of her losartan by 50% however this has not helped her fatigue. Of note, her husband's birthday was yesterday. This weekend is Mother's Day. Her son who died birthday is next month. She does report feeling lonely. She lives alone. She states that her family doesn't visit is much as they should Past Medical History  Diagnosis Date  . Thyroid disease   . Hypertension   . Hyperlipidemia   . Arthritis   . Cancer ([place])     breast, s/p RXT, bilateral  . Hypothyroidism   . Shortness of breath   . Anxiety   . History of stress test     Lexiscan Myoview 8/16:  EF 81%, breast attenuation, no ischemia; Low Risk  . History of echocardiogram     a. Echo 8/16: Vigorous LVF, EF Q000111Q, grade 1 diastolic dysfunction, trivial AI, mild  MR, mild RVE, normal RV function, PASP 33 mmHg // b. Echo 1/17 EF 65-70%, normal wall motion, grade 1 diastolic dysfunction, trivial AI, MAC, mild MR  . Allergy   . Blood transfusion without reported diagnosis   . Cataract    Past Surgical History  Procedure Laterality Date  . Abdominal hysterectomy  2008  . Thyroid surgery  2002  . Breast lumpectomy  2000  . Breast lumpectomy  2007  . Cataract extraction  2011  . Rotator cuff repair      right  . Knee surgery      right  . Bladder tact    . Total knee arthroplasty Left 06/02/2013    Dr Rhona Raider  . Total knee arthroplasty Right 06/02/2013    Procedure: RIGHT TOTAL KNEE ARTHROPLASTY;  Surgeon: Hessie Dibble, MD;  Location: [facility];  Service: Orthopedics;  Laterality: Right;  . Total knee arthroplasty Left 08/22/2014    Procedure: TOTAL KNEE ARTHROPLASTY;  Surgeon: Hessie Dibble, MD;  Location: [facility];  Service: Orthopedics;  Laterality: Left;  . Joint replacement     Current Outpatient Prescriptions on File Prior to Visit  Medication Sig Dispense Refill  . aspirin 81 MG tablet Take 81 mg by mouth daily.    . benzonatate (TESSALON) 200 MG capsule Take 1 capsule (200 mg total) by mouth every 8 (eight) hours. 30 capsule 0  . Calcium Carbonate-Vitamin D (CALCIUM + D) 600-200 MG-UNIT TABS Take 1 tablet by mouth 2 (two) times daily.      . cetirizine (ZYRTEC) 10 MG tablet Take 1 tablet (10 mg total) by mouth daily. 30 tablet 11  . Cranberry 250 MG CAPS Take 250 mg by mouth daily.    Marland Kitchen exemestane (AROMASIN) 25 MG tablet Take 25 mg by mouth daily after breakfast.    . fluticasone (FLONASE) 50 MCG/ACT nasal spray Place 2 sprays into both nostrils daily. 16 g 6  . folic acid (FOLVITE) 1 MG tablet Take 1 mg by mouth daily.    Marland Kitchen LORazepam (ATIVAN) 1 MG tablet TAKE 1 TABLET BY MOUTH IN THE MORNING & 1 TAB 30 MIN PRIOR TO BEDTIME 60 tablet 3  . Multiple Vitamins-Minerals (CENTRUM SILVER ADULT 50+ PO) Take 1 tablet by mouth daily.     .  nitroGLYCERIN (NITROSTAT) 0.4 MG SL tablet Place 1 tablet (0.4 mg total) under the tongue every 5 (five) minutes as needed for chest pain. 25 tablet 3  . potassium chloride SA (K-DUR,KLOR-CON) 20 MEQ tablet Take 1 tablet (20 mEq total) by mouth daily. 90 tablet 1   No current facility-administered medications on file prior to visit.   Allergies  Allergen Reactions  . Ambien [Zolpidem]     confusion  . Pyridium [Phenazopyridine Hcl] Other (See Comments)    hemolysis  . Sulfa Antibiotics Rash   Social History   Social History  . Marital Status: Widowed    Spouse Name: N/A  . Number of Children: N/A  . Years of Education: N/A   Occupational History  . Not on file.   Social History Main Topics  . Smoking status: Never Smoker   . Smokeless tobacco: Never Used     Comment: never used tobacco  . Alcohol Use: No  . Drug Use: No  . Sexual Activity: Not Currently   Other Topics Concern  . Not on file   Social History Narrative      Review of Systems  All other systems reviewed and are negative.      Objective:   Physical Exam  Constitutional: She is oriented to person, place, and time. She appears well-developed and well-nourished. No distress.  HENT:  Head: Normocephalic and atraumatic.  Right Ear: External ear normal.  Left Ear: External ear normal.  Nose: Nose normal.  Mouth/Throat: Oropharynx is clear and moist. No oropharyngeal exudate.  Eyes: Conjunctivae and EOM are normal. No scleral icterus.  Neck: Neck supple. No JVD present. No thyromegaly present.  Cardiovascular: Normal rate and regular rhythm.   Murmur heard. Pulmonary/Chest: Effort normal and breath sounds normal. No respiratory distress. She has no wheezes. She has no rales.  Abdominal: Soft. Bowel sounds are normal. She exhibits no distension. There is no tenderness. There is no rebound and no guarding.  Musculoskeletal: She exhibits no edema or tenderness.  Lymphadenopathy:  She has no cervical  adenopathy.  Neurological: She is alert and oriented to person, place, and time. She has normal reflexes. No cranial nerve deficit. She exhibits normal muscle tone. Coordination normal.  Skin: She is not diaphoretic.  Psychiatric: She has a normal mood and affect. Her behavior is normal. Judgment and thought content normal.  Vitals reviewed.         Assessment & Plan:  GAD (generalized anxiety disorder) - Plan: escitalopram (LEXAPRO) 10 MG tablet  Essential hypertension, benign - Plan: losartan (COZAAR) 50 MG tablet  Chest pain, unspecified chest pain type - Plan: pantoprazole (PROTONIX) 40 MG tablet  Chronic fatigue - Plan: CBC with Differential/Platelet, COMPLETE METABOLIC PANEL WITH GFR, TSH, VITAMIN D 25 Hydroxy (Vit-D Deficiency, Fractures), Vitamin B12  I believe the majority of her symptoms are likely due to depression. I believe is made worse during this time a year because of her husband and her son's birthday. Also believe she feels lonely. I will check a CBC, CMP, TSH, and vitamin B12 level to evaluate for metabolic causes of fatigue. If these are normal, I will recommend having the patient see a counselor and scheduled an appointment for her to make sure she follows through. I recommended a counselor in the past but she still has not scheduled to see one

## 2016-04-04 LAB — COMPLETE METABOLIC PANEL WITH GFR
ALK PHOS: 56 U/L (ref 33–130)
ALT: 22 U/L (ref 6–29)
AST: 27 U/L (ref 10–35)
Albumin: 4.3 g/dL (ref 3.6–5.1)
BILIRUBIN TOTAL: 0.4 mg/dL (ref 0.2–1.2)
BUN: 13 mg/dL (ref 7–25)
CALCIUM: 8.9 mg/dL (ref 8.6–10.4)
CO2: 27 mmol/L (ref 20–31)
CREATININE: 0.84 mg/dL (ref 0.60–0.88)
Chloride: 102 mmol/L (ref 98–110)
GFR, Est African American: 75 mL/min (ref >=60)
GFR, Est Non African American: 65 mL/min (ref >=60)
GLUCOSE: 115 mg/dL — AB (ref 70–99)
POTASSIUM: 4 mmol/L (ref 3.5–5.3)
SODIUM: 139 mmol/L (ref 135–146)
TOTAL PROTEIN: 6.3 g/dL (ref 6.1–8.1)

## 2016-04-04 LAB — VITAMIN D 25 HYDROXY (VIT D DEFICIENCY, FRACTURES): Vit D, 25-Hydroxy: 48 ng/mL (ref 30–100)

## 2016-04-04 LAB — CBC WITH DIFFERENTIAL/PLATELET
Basophils Absolute: 0 cells/uL (ref 0–200)
Basophils Relative: 0 %
EOS PCT: 2 %
Eosinophils Absolute: 138 cells/uL (ref 15–500)
HEMATOCRIT: 41.4 % (ref 35.0–45.0)
HEMOGLOBIN: 13.8 g/dL (ref 12.0–15.0)
LYMPHS ABS: 2070 {cells}/uL (ref 850–3900)
Lymphocytes Relative: 30 %
MCH: 30.9 pg (ref 27.0–33.0)
MCHC: 33.3 g/dL (ref 32.0–36.0)
MCV: 92.6 fL (ref 80.0–100.0)
MONO ABS: 759 {cells}/uL (ref 200–950)
MPV: 11.4 fL (ref 7.5–12.5)
Monocytes Relative: 11 %
NEUTROS ABS: 3933 {cells}/uL (ref 1500–7800)
Neutrophils Relative %: 57 %
Platelets: 207 10*3/uL (ref 140–400)
RBC: 4.47 MIL/uL (ref 3.80–5.10)
RDW: 13.2 % (ref 11.0–15.0)
WBC: 6.9 10*3/uL (ref 3.8–10.8)

## 2016-04-04 LAB — VITAMIN B12: Vitamin B-12: 728 pg/mL (ref 200–1100)

## 2016-04-04 LAB — TSH: TSH: 0.92 mIU/L

## 2016-04-07 ENCOUNTER — Telehealth: Payer: Self-pay | Admitting: Family Medicine

## 2016-04-07 MED ORDER — LORAZEPAM 1 MG PO TABS: ORAL_TABLET | ORAL | Status: DC

## 2016-04-07 NOTE — Telephone Encounter (Signed)
ok 

## 2016-04-07 NOTE — Telephone Encounter (Signed)
Medication called to pharmacy. 

## 2016-04-07 NOTE — Telephone Encounter (Signed)
Pt is requesting a refill of Lorazepam 1 mg.  [facility]

## 2016-04-07 NOTE — Telephone Encounter (Signed)
Ok to refill 

## 2016-04-25 ENCOUNTER — Encounter: Payer: Self-pay | Admitting: Family Medicine

## 2016-04-25 ENCOUNTER — Ambulatory Visit (INDEPENDENT_AMBULATORY_CARE_PROVIDER_SITE_OTHER): Payer: Medicare Other | Admitting: Family Medicine

## 2016-04-25 VITALS — BP 166/94 | HR 76 | Temp 98.1°F | Resp 18 | Wt 156.0 lb

## 2016-04-25 DIAGNOSIS — F411 Generalized anxiety disorder: Secondary | ICD-10-CM

## 2016-04-25 DIAGNOSIS — F329 Major depressive disorder, single episode, unspecified: Secondary | ICD-10-CM

## 2016-04-25 DIAGNOSIS — F32A Depression, unspecified: Secondary | ICD-10-CM

## 2016-04-25 NOTE — Progress Notes (Signed)
Subjective:    Patient ID: Hannah Galvan, female    DOB: 1931/10/25, 82 y.o.   MRN: [id number]  HPI 11/23/15 Recently admitted with chest pain and found to have a new LBBB on EKG but troponins were cycled and found to be negative x 3.  Was discharged home with outpatient follow up planned with cards, Dr. Gwenlyn Found.  Upon  Further questioning , the patient reports to daily anxiety attacks. She's been to the hospital several times with chest pain and shortness of breath which she now believes could be anxiety related. In the past she was on numerous medications including Lexapro and Wellbutrin pain medication and Xanax. At that time her son reports that she was overly sedated /overmedicated. They sent her to live with family members in [place] who gradually stopped several of her medicines and the patient improved dramatically. However in [place] she was living with her family and was occupied on a daily basis, eating with, and seeing her family on a daily basis.   She subsequently moved back home this fall after abruptly stopping Lexapro. Since being back home she is living independently. She admits that she is very lonely. Around that same time the panic attacks began happening almost on a daily basis.  Past medical history is significant for breast cancer, hypertension, hyperlipidemia, hypothyroidism status post partial thyroidectomy , an anxiety. She is due for mammogram. The remainder of her preventative care is up-to-date. She had a bone density in 2013 which was normal. It appears that she had Pneumovax 23 in 2013 however I see no documentation of Prevnar 13. This is due but the patient elects not to have this today.  At that time, my plan was: her anxiety is likely multifactorial.   I believe part of it is due to being lonely and being independent away from family. I encouraged the patient to try to become more active such as joining a senior group or volunteering in a local church or school or hospital.  She may even benefit from seeing a therapist. However I believe she would also benefit from being on a preventative medicine. I recommended starting Lexapro 10 mg by mouth daily as a preventative. I believe that she is having fewer panic attacks when she was on the Lexapro. She does not see that she benefited from Wellbutrin and therefore I recommended her discontinuing this medication to avoid polypharmacy. I would like the patient to return fasting to check a CBC, CMP, fasting lipid panel, and a TSH. I will like to recheck the patient in one month. I will also schedule her for screening mammogram.  At that time, my plan was: her anxiety is likely multifactorial.   I believe part of it is due to being lonely and being independent away from family. I encouraged the patient to try to become more active such as joining a senior group or volunteering in a local church or school or hospital. She may even benefit from seeing a therapist. However I believe she would also benefit from being on a preventative medicine. I recommended starting Lexapro 10 mg by mouth daily as a preventative. I believe that she is having fewer panic attacks when she was on the Lexapro. She does not see that she benefited from Wellbutrin and therefore I recommended her discontinuing this medication to avoid polypharmacy. I would like the patient to return fasting to check a CBC, CMP, fasting lipid panel, and a TSH. I will like to recheck the patient in  one month.  12/27/15 Patient states that she see some benefit from the Lexapro 10 mg a day. She is sleeping better at night but she continues to have anxiety attacks in the morning. At the present time she is using one half milligram of Ativan in the morning and 1 mg of Ativan in the evening. She has still not sought out counseling yet. She is not seeing a therapist. Her lab work returned excellent. Her mammogram was normal.  At that time, my plan was: I'm glad the patient is doing better on  the Lexapro. However I again encouraged her to seek counseling. I believe this can help with anxiety as well. I mentioned hospice grief counseling versus private therapist. She states that she will contact and schedule this on her own. Her lab work and mammogram are normal. I will make no changes in the Lexapro at this time. However I did recommend that she take 1 mg of Ativan in the morning and 1 mg of Ativan in the evening. Recheck in 4-6 weeks or sooner if worse  04/03/16 Patient is here today complaining of fatigue. She has no energy. She always feels tired. She denies feeling sleepy. She states that she sleeps well for at least 7 hours every evening. She also reports a dull headache is located in her for head which sounds like a tension headache. She denies any weight loss. In fact her weight is unchanged since February. She denies any fevers or chills or recent illness. She denies any cough, shortness of breath, chest pain, dyspnea on exertion. She denies any nausea vomiting or diarrhea. She denies any blood in her stool. She denies any dysuria or hematuria. She denies any bleeding or bruising. Was seen by the cardiologist in March complaining of fatigue and a decrease the dose of her losartan by 50% however this has not helped her fatigue. Of note, her husband's birthday was yesterday. This weekend is Mother's Day. Her son's, who died, birthday is next month. She does report feeling lonely. She lives alone. She states that her family doesn't visit is much as they should.  04/25/16 Labs were unremarkable. Patient continues to suffer from depression and anxiety. She is tearful today on our encounter. The patient found her son when he was killed. He was crushed by a building which collapsed on him that he was working on. The patient still sees his face at night when she tries to sleep. She has difficult time sleeping. The Lexapro and Ativan did not seem to be helping. She freely admits that she seldom sees  her son who is still living. Therefore she feels isolated and lonely. She denies any suicidal ideation but she certainly suffers from anhedonia, poor concentration, decreased energy, and insomnia Past Medical History  Diagnosis Date  . Thyroid disease   . Hypertension   . Hyperlipidemia   . Arthritis   . Cancer ([place])     breast, s/p RXT, bilateral  . Hypothyroidism   . Shortness of breath   . Anxiety   . History of stress test     Lexiscan Myoview 8/16:  EF 81%, breast attenuation, no ischemia; Low Risk  . History of echocardiogram     a. Echo 8/16: Vigorous LVF, EF Q000111Q, grade 1 diastolic dysfunction, trivial AI, mild MR, mild RVE, normal RV function, PASP 33 mmHg // b. Echo 1/17 EF 65-70%, normal wall motion, grade 1 diastolic dysfunction, trivial AI, MAC, mild MR  . Allergy   . Blood transfusion  without reported diagnosis   . Cataract    Past Surgical History  Procedure Laterality Date  . Abdominal hysterectomy  2008  . Thyroid surgery  2002  . Breast lumpectomy  2000  . Breast lumpectomy  2007  . Cataract extraction  2011  . Rotator cuff repair      right  . Knee surgery      right  . Bladder tact    . Total knee arthroplasty Left 06/02/2013    Dr Rhona Raider  . Total knee arthroplasty Right 06/02/2013    Procedure: RIGHT TOTAL KNEE ARTHROPLASTY;  Surgeon: Hessie Dibble, MD;  Location: [facility];  Service: Orthopedics;  Laterality: Right;  . Total knee arthroplasty Left 08/22/2014    Procedure: TOTAL KNEE ARTHROPLASTY;  Surgeon: Hessie Dibble, MD;  Location: [facility];  Service: Orthopedics;  Laterality: Left;  . Joint replacement     Current Outpatient Prescriptions on File Prior to Visit  Medication Sig Dispense Refill  . aspirin 81 MG tablet Take 81 mg by mouth daily.    . benzonatate (TESSALON) 200 MG capsule Take 1 capsule (200 mg total) by mouth every 8 (eight) hours. 30 capsule 0  . Calcium Carbonate-Vitamin D (CALCIUM + D) 600-200 MG-UNIT TABS Take 1 tablet by mouth  2 (two) times daily.      . cetirizine (ZYRTEC) 10 MG tablet Take 1 tablet (10 mg total) by mouth daily. 30 tablet 11  . Cranberry 250 MG CAPS Take 250 mg by mouth daily.    Marland Kitchen diltiazem (CARDIZEM) 30 MG tablet Take 1 tablet (30 mg total) by mouth daily as needed. Take as needed for palpitations 90 tablet 1  . escitalopram (LEXAPRO) 10 MG tablet Take 1 tablet (10 mg total) by mouth daily. 90 tablet 3  . exemestane (AROMASIN) 25 MG tablet Take 25 mg by mouth daily after breakfast.    . fluticasone (FLONASE) 50 MCG/ACT nasal spray Place 2 sprays into both nostrils daily. 16 g 6  . folic acid (FOLVITE) 1 MG tablet Take 1 mg by mouth daily.    Marland Kitchen levothyroxine (SYNTHROID) 125 MCG tablet Take 1 tablet (125 mcg total) by mouth daily. 90 tablet 1  . LORazepam (ATIVAN) 1 MG tablet TAKE 1 TABLET BY MOUTH IN THE MORNING & 1 TAB 30 MIN PRIOR TO BEDTIME 60 tablet 3  . losartan (COZAAR) 50 MG tablet Take 1 tablet (50 mg total) by mouth daily. 90 tablet 4  . Multiple Vitamins-Minerals (CENTRUM SILVER ADULT 50+ PO) Take 1 tablet by mouth daily.     . nitroGLYCERIN (NITROSTAT) 0.4 MG SL tablet Place 1 tablet (0.4 mg total) under the tongue every 5 (five) minutes as needed for chest pain. 25 tablet 3  . pantoprazole (PROTONIX) 40 MG tablet Take 1 tablet (40 mg total) by mouth as directed. 40 MG DAILY FOR 1 MONTH THEN AS NEEDED 90 tablet 3  . potassium chloride SA (K-DUR,KLOR-CON) 20 MEQ tablet Take 1 tablet (20 mEq total) by mouth daily. 90 tablet 1  . rosuvastatin (CRESTOR) 10 MG tablet Take 1 tablet (10 mg total) by mouth daily at 6 PM. 90 tablet 4   No current facility-administered medications on file prior to visit.   Allergies  Allergen Reactions  . Ambien [Zolpidem]     confusion  . Pyridium [Phenazopyridine Hcl] Other (See Comments)    hemolysis  . Sulfa Antibiotics Rash   Social History   Social History  . Marital Status: Widowed  Spouse Name: N/A  . Number of Children: N/A  . Years of  Education: N/A   Occupational History  . Not on file.   Social History Main Topics  . Smoking status: Never Smoker   . Smokeless tobacco: Never Used     Comment: never used tobacco  . Alcohol Use: No  . Drug Use: No  . Sexual Activity: Not Currently   Other Topics Concern  . Not on file   Social History Narrative      Review of Systems  All other systems reviewed and are negative.      Objective:   Physical Exam  Constitutional: She is oriented to person, place, and time. She appears well-developed and well-nourished. No distress.  HENT:  Head: Normocephalic and atraumatic.  Right Ear: External ear normal.  Left Ear: External ear normal.  Nose: Nose normal.  Mouth/Throat: Oropharynx is clear and moist. No oropharyngeal exudate.  Eyes: Conjunctivae and EOM are normal. No scleral icterus.  Neck: Neck supple. No JVD present. No thyromegaly present.  Cardiovascular: Normal rate and regular rhythm.   Murmur heard. Pulmonary/Chest: Effort normal and breath sounds normal. No respiratory distress. She has no wheezes. She has no rales.  Abdominal: Soft. Bowel sounds are normal. She exhibits no distension. There is no tenderness. There is no rebound and no guarding.  Musculoskeletal: She exhibits no edema or tenderness.  Lymphadenopathy:    She has no cervical adenopathy.  Neurological: She is alert and oriented to person, place, and time. She has normal reflexes. No cranial nerve deficit. She exhibits normal muscle tone. Coordination normal.  Skin: She is not diaphoretic.  Psychiatric: She has a normal mood and affect. Her behavior is normal. Judgment and thought content normal.  Vitals reviewed.         Assessment & Plan:  GAD (generalized anxiety disorder) - Plan: Ambulatory referral to Psychology  Depression - Plan: Ambulatory referral to Psychology  She has tried and failed a combination of Wellbutrin, Lexapro, Lexapro alone, Lexapro and Ativan. Therefore I will  have the patient discontinue Lexapro and replaced with Trintellix 5 mg poqday and increased to 10 mg a day in one week. I walked so consult a psychologist to arrange outpatient counseling and cognitive behavioral therapy. I will see the patient back in 3-4 weeks to assess for improvement on telemetry sooner if symptoms worsen

## 2016-05-01 ENCOUNTER — Other Ambulatory Visit: Payer: Self-pay | Admitting: Family Medicine

## 2016-05-01 NOTE — Telephone Encounter (Signed)
ok 

## 2016-05-01 NOTE — Telephone Encounter (Signed)
Refill appropriate and filled per protocol. 

## 2016-05-16 ENCOUNTER — Encounter: Payer: Self-pay | Admitting: Family Medicine

## 2016-05-16 ENCOUNTER — Telehealth: Payer: Self-pay | Admitting: Family Medicine

## 2016-05-16 ENCOUNTER — Ambulatory Visit (INDEPENDENT_AMBULATORY_CARE_PROVIDER_SITE_OTHER): Payer: Medicare Other | Admitting: Family Medicine

## 2016-05-16 VITALS — BP 156/84 | HR 84 | Temp 98.1°F | Resp 14 | Wt 154.0 lb

## 2016-05-16 DIAGNOSIS — I10 Essential (primary) hypertension: Secondary | ICD-10-CM

## 2016-05-16 DIAGNOSIS — F329 Major depressive disorder, single episode, unspecified: Secondary | ICD-10-CM | POA: Diagnosis not present

## 2016-05-16 DIAGNOSIS — F32A Depression, unspecified: Secondary | ICD-10-CM

## 2016-05-16 NOTE — Telephone Encounter (Addendum)
Pt's son is calling to speak with Dr. Dennard Schaumann about his mother's care. He states that she has been demonstrating symptoms of dementia. She has been in and out of reality and has been doing very strange things. He is very concerned and would like to speak with him after 5pm today [phone]

## 2016-05-16 NOTE — Progress Notes (Signed)
Subjective:    Patient ID: Hannah Galvan, female    DOB: 06-18-1931, 82 y.o.   MRN: [id number]  HPI 11/23/15 Recently admitted with chest pain and found to have a new LBBB on EKG but troponins were cycled and found to be negative x 3.  Was discharged home with outpatient follow up planned with cards, Dr. Gwenlyn Found.  Upon  Further questioning , the patient reports to daily anxiety attacks. She's been to the hospital several times with chest pain and shortness of breath which she now believes could be anxiety related. In the past she was on numerous medications including Lexapro and Wellbutrin pain medication and Xanax. At that time her son reports that she was overly sedated /overmedicated. They sent her to live with family members in [place] who gradually stopped several of her medicines and the patient improved dramatically. However in [place] she was living with her family and was occupied on a daily basis, eating with, and seeing her family on a daily basis.   She subsequently moved back home this fall after abruptly stopping Lexapro. Since being back home she is living independently. She admits that she is very lonely. Around that same time the panic attacks began happening almost on a daily basis.  Past medical history is significant for breast cancer, hypertension, hyperlipidemia, hypothyroidism status post partial thyroidectomy , an anxiety. She is due for mammogram. The remainder of her preventative care is up-to-date. She had a bone density in 2013 which was normal. It appears that she had Pneumovax 23 in 2013 however I see no documentation of Prevnar 13. This is due but the patient elects not to have this today.  At that time, my plan was: her anxiety is likely multifactorial.   I believe part of it is due to being lonely and being independent away from family. I encouraged the patient to try to become more active such as joining a senior group or volunteering in a local church or school or hospital.  She may even benefit from seeing a therapist. However I believe she would also benefit from being on a preventative medicine. I recommended starting Lexapro 10 mg by mouth daily as a preventative. I believe that she is having fewer panic attacks when she was on the Lexapro. She does not see that she benefited from Wellbutrin and therefore I recommended her discontinuing this medication to avoid polypharmacy. I would like the patient to return fasting to check a CBC, CMP, fasting lipid panel, and a TSH. I will like to recheck the patient in one month. I will also schedule her for screening mammogram.  At that time, my plan was: her anxiety is likely multifactorial.   I believe part of it is due to being lonely and being independent away from family. I encouraged the patient to try to become more active such as joining a senior group or volunteering in a local church or school or hospital. She may even benefit from seeing a therapist. However I believe she would also benefit from being on a preventative medicine. I recommended starting Lexapro 10 mg by mouth daily as a preventative. I believe that she is having fewer panic attacks when she was on the Lexapro. She does not see that she benefited from Wellbutrin and therefore I recommended her discontinuing this medication to avoid polypharmacy. I would like the patient to return fasting to check a CBC, CMP, fasting lipid panel, and a TSH. I will like to recheck the patient in  one month.  12/27/15 Patient states that she see some benefit from the Lexapro 10 mg a day. She is sleeping better at night but she continues to have anxiety attacks in the morning. At the present time she is using one half milligram of Ativan in the morning and 1 mg of Ativan in the evening. She has still not sought out counseling yet. She is not seeing a therapist. Her lab work returned excellent. Her mammogram was normal.  At that time, my plan was: I'm glad the patient is doing better on  the Lexapro. However I again encouraged her to seek counseling. I believe this can help with anxiety as well. I mentioned hospice grief counseling versus private therapist. She states that she will contact and schedule this on her own. Her lab work and mammogram are normal. I will make no changes in the Lexapro at this time. However I did recommend that she take 1 mg of Ativan in the morning and 1 mg of Ativan in the evening. Recheck in 4-6 weeks or sooner if worse  04/03/16 Patient is here today complaining of fatigue. She has no energy. She always feels tired. She denies feeling sleepy. She states that she sleeps well for at least 7 hours every evening. She also reports a dull headache is located in her for head which sounds like a tension headache. She denies any weight loss. In fact her weight is unchanged since February. She denies any fevers or chills or recent illness. She denies any cough, shortness of breath, chest pain, dyspnea on exertion. She denies any nausea vomiting or diarrhea. She denies any blood in her stool. She denies any dysuria or hematuria. She denies any bleeding or bruising. Was seen by the cardiologist in March complaining of fatigue and a decrease the dose of her losartan by 50% however this has not helped her fatigue. Of note, her husband's birthday was yesterday. This weekend is Mother's Day. Her son's, who died, birthday is next month. She does report feeling lonely. She lives alone. She states that her family doesn't visit is much as they should.  04/25/16 Labs were unremarkable. Patient continues to suffer from depression and anxiety. She is tearful today on our encounter. The patient found her son when he was killed. He was crushed by a building which collapsed on him that he was working on. The patient still sees his face at night when she tries to sleep. She has difficult time sleeping. The Lexapro and Ativan did not seem to be helping. She freely admits that she seldom sees  her son who is still living. Therefore she feels isolated and lonely. She denies any suicidal ideation but she certainly suffers from anhedonia, poor concentration, decreased energy, and insomnia.  AT that time, my plan was: She has tried and failed a combination of Wellbutrin, Lexapro, Lexapro alone, Lexapro and Ativan. Therefore I will have the patient discontinue Lexapro and replaced with Trintellix 5 mg poqday and increased to 10 mg a day in one week. I walked so consult a psychologist to arrange outpatient counseling and cognitive behavioral therapy. I will see the patient back in 3-4 weeks to assess for improvement on telemetry sooner if symptoms worsen  05/16/16 Patient feels like she is doing better on Trintellix 10 mg a day. She would like to continue that. However she reports a dull headache every morning when she wakes up. She states that this predates the medication has been going on possibly the last 2 months.  However it seems to be getting worse.   She had a CT scan of the head in November that was normal. The headache is located just above her eyebrows. It is dull and constant and pressure-like. She denies any vision changes. She denies any blurry vision. She denies any double vision. She denies any sinus pain or rhinorrhea or sore throat or postnasal drip or otalgia. She denies any head trauma. She has no history of migraines. This is the third visit in a row her blood pressure is high Past Medical History  Diagnosis Date  . Thyroid disease   . Hypertension   . Hyperlipidemia   . Arthritis   . Cancer ([facility])     breast, s/p RXT, bilateral  . Hypothyroidism   . Shortness of breath   . Anxiety   . History of stress test     Lexiscan Myoview 8/16:  EF 81%, breast attenuation, no ischemia; Low Risk  . History of echocardiogram     a. Echo 8/16: Vigorous LVF, EF [id number], grade 1 diastolic dysfunction, trivial AI, mild MR, mild RVE, normal RV function, PASP 33 mmHg // b. Echo 1/17 EF 65-70%,  normal wall motion, grade 1 diastolic dysfunction, trivial AI, MAC, mild MR  . Allergy   . Blood transfusion without reported diagnosis   . Cataract    Past Surgical History  Procedure Laterality Date  . Abdominal hysterectomy  2008  . Thyroid surgery  2002  . Breast lumpectomy  2000  . Breast lumpectomy  2007  . Cataract extraction  2011  . Rotator cuff repair      right  . Knee surgery      right  . Bladder tact    . Total knee arthroplasty Left 06/02/2013    Dr Rhona Raider  . Total knee arthroplasty Right 06/02/2013    Procedure: RIGHT TOTAL KNEE ARTHROPLASTY;  Surgeon: Hessie Dibble, MD;  Location: [facility];  Service: Orthopedics;  Laterality: Right;  . Total knee arthroplasty Left 08/22/2014    Procedure: TOTAL KNEE ARTHROPLASTY;  Surgeon: Hessie Dibble, MD;  Location: [facility];  Service: Orthopedics;  Laterality: Left;  . Joint replacement     Current Outpatient Prescriptions on File Prior to Visit  Medication Sig Dispense Refill  . aspirin 81 MG tablet Take 81 mg by mouth daily.    . Calcium Carbonate-Vitamin D (CALCIUM + D) 600-200 MG-UNIT TABS Take 1 tablet by mouth 2 (two) times daily.      . cetirizine (ZYRTEC) 10 MG tablet Take 1 tablet (10 mg total) by mouth daily. 30 tablet 11  . Cranberry 250 MG CAPS Take 250 mg by mouth daily.    Marland Kitchen diltiazem (CARDIZEM) 30 MG tablet Take 1 tablet (30 mg total) by mouth daily as needed. Take as needed for palpitations 90 tablet 1  . exemestane (AROMASIN) 25 MG tablet TAKE 1 TABLET EVERY DAY 90 tablet 2  . fluticasone (FLONASE) 50 MCG/ACT nasal spray Place 2 sprays into both nostrils daily. 16 g 6  . folic acid (FOLVITE) 1 MG tablet Take 1 mg by mouth daily.    Marland Kitchen levothyroxine (SYNTHROID) 125 MCG tablet Take 1 tablet (125 mcg total) by mouth daily. 90 tablet 1  . LORazepam (ATIVAN) 1 MG tablet TAKE 1 TABLET BY MOUTH IN THE MORNING & 1 TAB 30 MIN PRIOR TO BEDTIME 60 tablet 3  . losartan (COZAAR) 50 MG tablet Take 1 tablet (50 mg total) by  mouth daily. 90 tablet 4  .  Multiple Vitamins-Minerals (CENTRUM SILVER ADULT 50+ PO) Take 1 tablet by mouth daily.     . nitroGLYCERIN (NITROSTAT) 0.4 MG SL tablet Place 1 tablet (0.4 mg total) under the tongue every 5 (five) minutes as needed for chest pain. 25 tablet 3  . pantoprazole (PROTONIX) 40 MG tablet Take 1 tablet (40 mg total) by mouth as directed. 40 MG DAILY FOR 1 MONTH THEN AS NEEDED 90 tablet 3  . potassium chloride SA (K-DUR,KLOR-CON) 20 MEQ tablet Take 1 tablet (20 mEq total) by mouth daily. 90 tablet 1  . rosuvastatin (CRESTOR) 10 MG tablet Take 1 tablet (10 mg total) by mouth daily at 6 PM. 90 tablet 4   No current facility-administered medications on file prior to visit.   Allergies  Allergen Reactions  . Ambien [Zolpidem]     confusion  . Pyridium [Phenazopyridine Hcl] Other (See Comments)    hemolysis  . Sulfa Antibiotics Rash   Social History   Social History  . Marital Status: Widowed    Spouse Name: N/A  . Number of Children: N/A  . Years of Education: N/A   Occupational History  . Not on file.   Social History Main Topics  . Smoking status: Never Smoker   . Smokeless tobacco: Never Used     Comment: never used tobacco  . Alcohol Use: No  . Drug Use: No  . Sexual Activity: Not Currently   Other Topics Concern  . Not on file   Social History Narrative      Review of Systems  All other systems reviewed and are negative.      Objective:   Physical Exam  Constitutional: She is oriented to person, place, and time. She appears well-developed and well-nourished. No distress.  HENT:  Head: Normocephalic and atraumatic.  Right Ear: External ear normal.  Left Ear: External ear normal.  Nose: Nose normal.  Mouth/Throat: Oropharynx is clear and moist. No oropharyngeal exudate.  Eyes: Conjunctivae and EOM are normal. No scleral icterus.  Neck: Neck supple. No JVD present. No thyromegaly present.  Cardiovascular: Normal rate and regular rhythm.     Murmur heard. Pulmonary/Chest: Effort normal and breath sounds normal. No respiratory distress. She has no wheezes. She has no rales.  Abdominal: Soft. Bowel sounds are normal. She exhibits no distension. There is no tenderness. There is no rebound and no guarding.  Musculoskeletal: She exhibits no edema or tenderness.  Lymphadenopathy:    She has no cervical adenopathy.  Neurological: She is alert and oriented to person, place, and time. She has normal reflexes. No cranial nerve deficit. She exhibits normal muscle tone. Coordination normal.  Skin: She is not diaphoretic.  Psychiatric: She has a normal mood and affect. Her behavior is normal. Judgment and thought content normal.  Vitals reviewed.         Assessment & Plan:  Essential hypertension  Depression  I believe the headaches are due to her elevated blood pressure. Add Bystolic 10 mg a day and recheck blood pressure here in one week and reassess the headaches for improvement. It is also possible she is having tension headaches. Meanwhile continue Trintellix 10 mg a day. The patient was given samples of these medications. Also consider sleep apnea as a possible cause of early morning headache

## 2016-05-17 NOTE — Telephone Encounter (Signed)
Can we call her son [phone]) and let him know I am away on vacation and I will call when I get back (7/3)

## 2016-05-19 ENCOUNTER — Ambulatory Visit (INDEPENDENT_AMBULATORY_CARE_PROVIDER_SITE_OTHER): Payer: Medicare Other | Admitting: Podiatry

## 2016-05-20 NOTE — Telephone Encounter (Signed)
FYI - Son called back and wanted to know what medication changes we made that his mom told him we stopped all BP meds except the new one and we changed her depression med as well.I explained to him verbatim your note and will go back and restart her BP meds but she has been off everything since her LOV expect bystolic also informed him you did not changed her depression medication.  He would like to have her tested for dementia - he states that his sister that lives somewhere else - had a blood test done to see if her dad had dementia. I informed him I had no idea but we could do mental test on her at next ov.   No need to call him back but he would like to be informed if we change any medications.

## 2016-05-20 NOTE — Telephone Encounter (Signed)
Aware via vm 

## 2016-05-21 ENCOUNTER — Ambulatory Visit (INDEPENDENT_AMBULATORY_CARE_PROVIDER_SITE_OTHER): Payer: Medicare Other | Admitting: Podiatry

## 2016-05-21 ENCOUNTER — Encounter: Payer: Self-pay | Admitting: Podiatry

## 2016-05-21 VITALS — BP 134/72 | HR 76 | Resp 16

## 2016-05-21 DIAGNOSIS — M79674 Pain in right toe(s): Secondary | ICD-10-CM | POA: Diagnosis not present

## 2016-05-21 DIAGNOSIS — M79604 Pain in right leg: Secondary | ICD-10-CM

## 2016-05-21 DIAGNOSIS — M79675 Pain in left toe(s): Secondary | ICD-10-CM

## 2016-05-21 DIAGNOSIS — M2041 Other hammer toe(s) (acquired), right foot: Secondary | ICD-10-CM | POA: Diagnosis not present

## 2016-05-21 DIAGNOSIS — B351 Tinea unguium: Secondary | ICD-10-CM | POA: Diagnosis not present

## 2016-05-21 DIAGNOSIS — M79605 Pain in left leg: Secondary | ICD-10-CM

## 2016-05-22 NOTE — Progress Notes (Signed)
Subjective:     Patient ID: Hannah Galvan, female   DOB: November 24, 1931, 82 y.o.   MRN: [id number]  HPI patient presents with thickness of the right first second and third nails and states it's getting worse and she needs to do something with them. States trimming only helped her temporarily and all the nails give her some discomfort   Review of Systems     Objective:   Physical Exam Neurovascular status found to be intact muscle strength adequate with thickness and dystrophic changes of the nailbeds 1 through 5 both feet with the first second and third right being especially tender and incurvated. Patient has pain when I palpated from a dorsal direction and it continues to get worse over time    Assessment:     Chronic nail disease with thickness mycosis and damage with the hallux second and third nails of the right more bothersome    Plan:     H&P and reviewed condition with patient at great length. Today I did debridement of nails to take pressure off them and I've recommended due to excellent circulation in good health removal of the hallux second and third nails right which I educated her on at this time. Patient wants to do this understanding the procedure and is scheduled for this particular procedure

## 2016-05-26 ENCOUNTER — Encounter: Payer: Self-pay | Admitting: Family Medicine

## 2016-05-26 ENCOUNTER — Ambulatory Visit (INDEPENDENT_AMBULATORY_CARE_PROVIDER_SITE_OTHER): Payer: Medicare Other | Admitting: Family Medicine

## 2016-05-26 VITALS — BP 166/84 | HR 82 | Temp 98.6°F | Resp 16 | Wt 155.0 lb

## 2016-05-26 DIAGNOSIS — R413 Other amnesia: Secondary | ICD-10-CM

## 2016-05-26 DIAGNOSIS — F411 Generalized anxiety disorder: Secondary | ICD-10-CM

## 2016-05-26 DIAGNOSIS — R519 Headache, unspecified: Secondary | ICD-10-CM

## 2016-05-26 DIAGNOSIS — R51 Headache: Secondary | ICD-10-CM

## 2016-05-26 DIAGNOSIS — I10 Essential (primary) hypertension: Secondary | ICD-10-CM

## 2016-05-26 DIAGNOSIS — F329 Major depressive disorder, single episode, unspecified: Secondary | ICD-10-CM

## 2016-05-26 DIAGNOSIS — F32A Depression, unspecified: Secondary | ICD-10-CM

## 2016-05-26 MED ORDER — AMLODIPINE BESYLATE 5 MG PO TABS: 5.0000 mg | ORAL_TABLET | Freq: Every day | ORAL | Status: DC

## 2016-05-26 NOTE — Progress Notes (Signed)
Subjective:    Patient ID: Hannah Galvan, female    DOB: 06-05-1931, 82 y.o.   MRN: [id number]  HPI 04/03/16 Patient is here today complaining of fatigue. She has no energy. She always feels tired. She denies feeling sleepy. She states that she sleeps well for at least 7 hours every evening. She also reports a dull headache is located in her for head which sounds like a tension headache. She denies any weight loss. In fact her weight is unchanged since February. She denies any fevers or chills or recent illness. She denies any cough, shortness of breath, chest pain, dyspnea on exertion. She denies any nausea vomiting or diarrhea. She denies any blood in her stool. She denies any dysuria or hematuria. She denies any bleeding or bruising. Was seen by the cardiologist in March complaining of fatigue and a decrease the dose of her losartan by 50% however this has not helped her fatigue. Of note, her husband's birthday was yesterday. This weekend is Mother's Day. Her son's, who died, birthday is next month. She does report feeling lonely. She lives alone. She states that her family doesn't visit is much as they should.  04/25/16 Labs were unremarkable. Patient continues to suffer from depression and anxiety. She is tearful today on our encounter. The patient found her son when he was killed. He was crushed by a building which collapsed on him that he was working on. The patient still sees his face at night when she tries to sleep. She has difficult time sleeping. The Lexapro and Ativan did not seem to be helping. She freely admits that she seldom sees her son who is still living. Therefore she feels isolated and lonely. She denies any suicidal ideation but she certainly suffers from anhedonia, poor concentration, decreased energy, and insomnia.  AT that time, my plan was: She has tried and failed a combination of Wellbutrin, Lexapro, Lexapro alone, Lexapro and Ativan. Therefore I will have the patient  discontinue Lexapro and replaced with Trintellix 5 mg poqday and increased to 10 mg a day in one week. I walked so consult a psychologist to arrange outpatient counseling and cognitive behavioral therapy. I will see the patient back in 3-4 weeks to assess for improvement on telemetry sooner if symptoms worsen  05/16/16 Patient feels like she is doing better on Trintellix 10 mg a day. She would like to continue that. However she reports a dull headache every morning when she wakes up. She states that this predates the medication as it has  been going on possibly the last 2 months. However it seems to be getting worse.   She had a CT scan of the head in November that was normal. The headache is located just above her eyebrows. It is dull and constant and pressure-like. She denies any vision changes. She denies any blurry vision. She denies any double vision. She denies any sinus pain or rhinorrhea or sore throat or postnasal drip or otalgia. She denies any head trauma. She has no history of migraines. This is the third visit in a row her blood pressure is high.  At that time, my plan was: I believe the headaches are due to her elevated blood pressure. Add Bystolic 10 mg a day and recheck blood pressure here in one week and reassess the headaches for improvement. It is also possible she is having tension headaches. Meanwhile continue Trintellix 10 mg a day. The patient was given samples of these medications. Also consider sleep apnea  as a possible cause of early morning headache  05/26/16 He is here today for recheck. Her blood pressure still elevated however her family states that she became confused and stopped all blood pressure medication prior to starting the new blood pressure medication. She has only been on both medications for for 5 days. However the blood pressure still significantly elevated. She continues to have daily headache located above both eyes. The headache is a pressure-like sensation which is  constant. Her son is also concerned about memory problems that she's having. I performed a Mini-Mental status exam today. The patient was unable to tell me the year correctly. She remembered 2 out of 3 objects on rapid recall. She was able to get 3 out of 5 letters on spelling "world" backwards. She was unable to draw intersecting pentagons. I also had the patient perform a clock drawing diagram. I asked the patient to draw a clock and make the clock say for 45. She placed a 4 at the appropriate position on the clock with no other numbers and wrote 45 in the location of benign but was unable to draw the hands of the clock. She also demonstrates some difficulty in remembering names of objects. Therefore the patient scores 25 out of 30 on Mini-Mental status exam consistent with mild dementia Past Medical History  Diagnosis Date  . Thyroid disease   . Hypertension   . Hyperlipidemia   . Arthritis   . Cancer ([place])     breast, s/p RXT, bilateral  . Hypothyroidism   . Shortness of breath   . Anxiety   . History of stress test     Lexiscan Myoview 8/16:  EF 81%, breast attenuation, no ischemia; Low Risk  . History of echocardiogram     a. Echo 8/16: Vigorous LVF, EF [id number], grade 1 diastolic dysfunction, trivial AI, mild MR, mild RVE, normal RV function, PASP 33 mmHg // b. Echo 1/17 EF 65-70%, normal wall motion, grade 1 diastolic dysfunction, trivial AI, MAC, mild MR  . Allergy   . Blood transfusion without reported diagnosis   . Cataract    Past Surgical History  Procedure Laterality Date  . Abdominal hysterectomy  2008  . Thyroid surgery  2002  . Breast lumpectomy  2000  . Breast lumpectomy  2007  . Cataract extraction  2011  . Rotator cuff repair      right  . Knee surgery      right  . Bladder tact    . Total knee arthroplasty Left 06/02/2013    Dr Rhona Raider  . Total knee arthroplasty Right 06/02/2013    Procedure: RIGHT TOTAL KNEE ARTHROPLASTY;  Surgeon: Hessie Dibble, MD;   Location: [place];  Service: Orthopedics;  Laterality: Right;  . Total knee arthroplasty Left 08/22/2014    Procedure: TOTAL KNEE ARTHROPLASTY;  Surgeon: Hessie Dibble, MD;  Location: [place];  Service: Orthopedics;  Laterality: Left;  . Joint replacement     Current Outpatient Prescriptions on File Prior to Visit  Medication Sig Dispense Refill  . aspirin 81 MG tablet Take 81 mg by mouth daily.    . Calcium Carbonate-Vitamin D (CALCIUM + D) 600-200 MG-UNIT TABS Take 1 tablet by mouth 2 (two) times daily.      . cetirizine (ZYRTEC) 10 MG tablet Take 1 tablet (10 mg total) by mouth daily. 30 tablet 11  . Cranberry 250 MG CAPS Take 250 mg by mouth daily.    Marland Kitchen diltiazem (CARDIZEM) 30 MG tablet  Take 1 tablet (30 mg total) by mouth daily as needed. Take as needed for palpitations 90 tablet 1  . exemestane (AROMASIN) 25 MG tablet TAKE 1 TABLET EVERY DAY 90 tablet 2  . fluticasone (FLONASE) 50 MCG/ACT nasal spray Place 2 sprays into both nostrils daily. 16 g 6  . folic acid (FOLVITE) 1 MG tablet Take 1 mg by mouth daily.    Marland Kitchen levothyroxine (SYNTHROID) 125 MCG tablet Take 1 tablet (125 mcg total) by mouth daily. 90 tablet 1  . LORazepam (ATIVAN) 1 MG tablet TAKE 1 TABLET BY MOUTH IN THE MORNING & 1 TAB 30 MIN PRIOR TO BEDTIME 60 tablet 3  . losartan (COZAAR) 50 MG tablet Take 1 tablet (50 mg total) by mouth daily. 90 tablet 4  . Multiple Vitamins-Minerals (CENTRUM SILVER ADULT 50+ PO) Take 1 tablet by mouth daily.     . nitroGLYCERIN (NITROSTAT) 0.4 MG SL tablet Place 1 tablet (0.4 mg total) under the tongue every 5 (five) minutes as needed for chest pain. 25 tablet 3  . pantoprazole (PROTONIX) 40 MG tablet Take 1 tablet (40 mg total) by mouth as directed. 40 MG DAILY FOR 1 MONTH THEN AS NEEDED 90 tablet 3  . potassium chloride SA (K-DUR,KLOR-CON) 20 MEQ tablet Take 1 tablet (20 mEq total) by mouth daily. 90 tablet 1  . rosuvastatin (CRESTOR) 10 MG tablet Take 1 tablet (10 mg total) by mouth daily at 6  PM. 90 tablet 4  . vortioxetine HBr (TRINTELLIX) 10 MG TABS Take by mouth.     No current facility-administered medications on file prior to visit.   Allergies  Allergen Reactions  . Ambien [Zolpidem]     confusion  . Pyridium [Phenazopyridine Hcl] Other (See Comments)    hemolysis  . Sulfa Antibiotics Rash   Social History   Social History  . Marital Status: Widowed    Spouse Name: N/A  . Number of Children: N/A  . Years of Education: N/A   Occupational History  . Not on file.   Social History Main Topics  . Smoking status: Never Smoker   . Smokeless tobacco: Never Used     Comment: never used tobacco  . Alcohol Use: No  . Drug Use: No  . Sexual Activity: Not Currently   Other Topics Concern  . Not on file   Social History Narrative      Review of Systems  All other systems reviewed and are negative.      Objective:   Physical Exam  Constitutional: She is oriented to person, place, and time. She appears well-developed and well-nourished. No distress.  Neck: Neck supple. No JVD present. No thyromegaly present.  Cardiovascular: Normal rate and regular rhythm.   Murmur heard. Pulmonary/Chest: Effort normal and breath sounds normal. No respiratory distress. She has no wheezes. She has no rales.  Abdominal: Soft. Bowel sounds are normal.  Musculoskeletal: She exhibits no edema.  Lymphadenopathy:    She has no cervical adenopathy.  Neurological: She is alert and oriented to person, place, and time. No cranial nerve deficit. She exhibits normal muscle tone.  Skin: She is not diaphoretic.  Vitals reviewed.         Assessment & Plan:  Essential hypertension  Depression  GAD (generalized anxiety disorder)  Add amlodipine 5 mg by mouth daily to her losartan and bystolic.  Given the headache and the memory problems, I'll schedule the patient for an MRI as I'm concerned that she may have had a small  stroke. Await the results of MRI prior to starting the  patient on Aricept. Continue Trintellix 10 mg poqday.

## 2016-05-29 ENCOUNTER — Ambulatory Visit: Payer: Medicare Other | Admitting: Family Medicine

## 2016-06-02 ENCOUNTER — Ambulatory Visit: Payer: Medicare Other | Admitting: Podiatry

## 2016-06-05 ENCOUNTER — Ambulatory Visit
Admission: RE | Admit: 2016-06-05 | Discharge: 2016-06-05 | Disposition: A | Discharge status: Home / Self Care | Payer: Medicare Other | Source: Ambulatory Visit | Attending: Family Medicine | Admitting: Family Medicine

## 2016-06-05 DIAGNOSIS — R413 Other amnesia: Secondary | ICD-10-CM

## 2016-06-05 DIAGNOSIS — R519 Headache, unspecified: Secondary | ICD-10-CM

## 2016-06-05 DIAGNOSIS — R51 Headache: Secondary | ICD-10-CM

## 2016-06-05 MED ORDER — GADOBENATE DIMEGLUMINE 529 MG/ML IV SOLN
15.0000 mL | Freq: Once | INTRAVENOUS | Status: AC | PRN
  Administered 2016-06-05: 15 mL via INTRAVENOUS

## 2016-06-16 ENCOUNTER — Ambulatory Visit (HOSPITAL_BASED_OUTPATIENT_CLINIC_OR_DEPARTMENT_OTHER): Payer: Medicare Other | Admitting: Hematology & Oncology

## 2016-06-16 ENCOUNTER — Other Ambulatory Visit: Payer: Self-pay | Admitting: Family

## 2016-06-16 ENCOUNTER — Other Ambulatory Visit (HOSPITAL_BASED_OUTPATIENT_CLINIC_OR_DEPARTMENT_OTHER): Payer: Medicare Other

## 2016-06-16 ENCOUNTER — Encounter: Payer: Self-pay | Admitting: Hematology & Oncology

## 2016-06-16 VITALS — BP 123/59 | HR 60 | Temp 98.1°F | Resp 16 | Ht 62.5 in | Wt 155.0 lb

## 2016-06-16 DIAGNOSIS — E032 Hypothyroidism due to medicaments and other exogenous substances: Secondary | ICD-10-CM

## 2016-06-16 DIAGNOSIS — Z853 Personal history of malignant neoplasm of breast: Secondary | ICD-10-CM | POA: Diagnosis not present

## 2016-06-16 DIAGNOSIS — C50919 Malignant neoplasm of unspecified site of unspecified female breast: Secondary | ICD-10-CM

## 2016-06-16 DIAGNOSIS — Z86000 Personal history of in-situ neoplasm of breast: Secondary | ICD-10-CM

## 2016-06-16 DIAGNOSIS — I10 Essential (primary) hypertension: Secondary | ICD-10-CM

## 2016-06-16 DIAGNOSIS — R0602 Shortness of breath: Secondary | ICD-10-CM | POA: Diagnosis not present

## 2016-06-16 DIAGNOSIS — J45909 Unspecified asthma, uncomplicated: Secondary | ICD-10-CM

## 2016-06-16 LAB — COMPREHENSIVE METABOLIC PANEL
ALT: 30 U/L (ref 0–55)
ANION GAP: 10 meq/L (ref 3–11)
AST: 33 U/L (ref 5–34)
Albumin: 4 g/dL (ref 3.5–5.0)
Alkaline Phosphatase: 62 U/L (ref 40–150)
BILIRUBIN TOTAL: 0.44 mg/dL (ref 0.20–1.20)
BUN: 16 mg/dL (ref 7.0–26.0)
CALCIUM: 9.2 mg/dL (ref 8.4–10.4)
CO2: 24 meq/L (ref 22–29)
CREATININE: 0.9 mg/dL (ref 0.6–1.1)
Chloride: 104 mEq/L (ref 98–109)
EGFR: 58 mL/min/{1.73_m2} — ABNORMAL LOW (ref >90)
Glucose: 76 mg/dl (ref 70–140)
Potassium: 4.3 mEq/L (ref 3.5–5.1)
Sodium: 138 mEq/L (ref 136–145)
TOTAL PROTEIN: 6.8 g/dL (ref 6.4–8.3)

## 2016-06-16 LAB — CBC WITH DIFFERENTIAL (CANCER CENTER ONLY)
BASO#: 0 10*3/uL (ref 0.0–0.2)
BASO%: 0.5 % (ref 0.0–2.0)
EOS ABS: 0.2 10*3/uL (ref 0.0–0.5)
EOS%: 2.2 % (ref 0.0–7.0)
HCT: 42.6 % (ref 34.8–46.6)
HEMOGLOBIN: 14.6 g/dL (ref 11.6–15.9)
LYMPH#: 2.8 10*3/uL (ref 0.9–3.3)
LYMPH%: 36 % (ref 14.0–48.0)
MCH: 32 pg (ref 26.0–34.0)
MCHC: 34.3 g/dL (ref 32.0–36.0)
MCV: 93 fL (ref 81–101)
MONO#: 0.9 10*3/uL (ref 0.1–0.9)
MONO%: 11.4 % (ref 0.0–13.0)
NEUT%: 49.9 % (ref 39.6–80.0)
NEUTROS ABS: 3.8 10*3/uL (ref 1.5–6.5)
Platelets: 203 10*3/uL (ref 145–400)
RBC: 4.56 10*6/uL (ref 3.70–5.32)
RDW: 13.1 % (ref 11.1–15.7)
WBC: 7.7 10*3/uL (ref 3.9–10.0)

## 2016-06-16 MED ORDER — IPRATROPIUM-ALBUTEROL 20-100 MCG/ACT IN AERS: 1.0000 | INHALATION_SPRAY | Freq: Four times a day (QID) | RESPIRATORY_TRACT | 1 refills | Status: DC | PRN

## 2016-06-16 NOTE — Progress Notes (Signed)
Hematology and Oncology Follow Up Visit  Hannah Galvan [id number] 1931/06/23 82 y.o. 06/16/2016   Principle Diagnosis:  1. Ductal carcinoma in situ of the left breast. 2. History of stage IIA (T1 N1 M0) ductal carcinoma of the right     breast. 3. History of drug-induced hemolytic anemia  Current Therapy:   Aromasin 25 mg p.o. daily for prophylaxis.     Interim History:  Ms.  Galvan is back for followup. She seems to be holding on fairly well. She recently had an MRI of the brain because of headaches. Thankfully, the MRI the brain did not show any evidence of active disease. She had mild progression of atrophy.  We see her yearly. She is doing okay. Her family is doing a good job helping take care of her. She comes in with a daughter and great grandson.   She has complained a little bit of some discomfort in the left lateral breast. This seems to happen in the morning.   Also in the morning, she feels short of breath. I think she has seen a pulmonologist. She feels that she may have some palpitations. I will go ahead and give her an inhaler prescription to see if this helps her a little bit.   Overall, her performance status is ECOG 2.   Medications:  Current Outpatient Prescriptions:  .  amLODipine (NORVASC) 5 MG tablet, Take 1 tablet (5 mg total) by mouth daily., Disp: 90 tablet, Rfl: 3 .  aspirin 81 MG tablet, Take 81 mg by mouth daily., Disp: , Rfl:  .  Calcium Carbonate-Vitamin D (CALCIUM + D) 600-200 MG-UNIT TABS, Take 1 tablet by mouth 2 (two) times daily.  , Disp: , Rfl:  .  cetirizine (ZYRTEC) 10 MG tablet, Take 1 tablet (10 mg total) by mouth daily., Disp: 30 tablet, Rfl: 11 .  Cranberry 250 MG CAPS, Take 250 mg by mouth daily., Disp: , Rfl:  .  diltiazem (CARDIZEM) 30 MG tablet, Take 1 tablet (30 mg total) by mouth daily as needed. Take as needed for palpitations, Disp: 90 tablet, Rfl: 1 .  exemestane (AROMASIN) 25 MG tablet, TAKE 1 TABLET EVERY DAY, Disp: 90 tablet, Rfl:  2 .  fluticasone (FLONASE) 50 MCG/ACT nasal spray, Place 2 sprays into both nostrils daily., Disp: 16 g, Rfl: 6 .  folic acid (FOLVITE) 1 MG tablet, Take 1 mg by mouth daily., Disp: , Rfl:  .  levothyroxine (SYNTHROID) 125 MCG tablet, Take 1 tablet (125 mcg total) by mouth daily., Disp: 90 tablet, Rfl: 1 .  LORazepam (ATIVAN) 1 MG tablet, TAKE 1 TABLET BY MOUTH IN THE MORNING & 1 TAB 30 MIN PRIOR TO BEDTIME, Disp: 60 tablet, Rfl: 3 .  losartan (COZAAR) 50 MG tablet, Take 1 tablet (50 mg total) by mouth daily., Disp: 90 tablet, Rfl: 4 .  Multiple Vitamins-Minerals (CENTRUM SILVER ADULT 50+ PO), Take 1 tablet by mouth daily. , Disp: , Rfl:  .  nebivolol (BYSTOLIC) 10 MG tablet, Take 10 mg by mouth daily., Disp: , Rfl:  .  nitroGLYCERIN (NITROSTAT) 0.4 MG SL tablet, Place 1 tablet (0.4 mg total) under the tongue every 5 (five) minutes as needed for chest pain., Disp: 25 tablet, Rfl: 3 .  pantoprazole (PROTONIX) 40 MG tablet, Take 1 tablet (40 mg total) by mouth as directed. 40 MG DAILY FOR 1 MONTH THEN AS NEEDED, Disp: 90 tablet, Rfl: 3 .  potassium chloride SA (K-DUR,KLOR-CON) 20 MEQ tablet, Take 1 tablet (20 mEq total)  by mouth daily., Disp: 90 tablet, Rfl: 1 .  rosuvastatin (CRESTOR) 10 MG tablet, Take 1 tablet (10 mg total) by mouth daily at 6 PM., Disp: 90 tablet, Rfl: 4 .  vortioxetine HBr (TRINTELLIX) 10 MG TABS, Take by mouth., Disp: , Rfl:  .  Ipratropium-Albuterol (COMBIVENT) 20-100 MCG/ACT AERS respimat, Inhale 1 puff into the lungs every 6 (six) hours as needed for wheezing., Disp: 2 Inhaler, Rfl: 1  Allergies:  Allergies  Allergen Reactions  . Ambien [Zolpidem]     confusion  . Pyridium [Phenazopyridine Hcl] Other (See Comments)    hemolysis  . Sulfa Antibiotics Rash    Past Medical History, Surgical history, Social history, and Family History were reviewed and updated.  Review of Systems: As above  Physical Exam:  height is 5' 2.5" (1.588 m) and weight is 155 lb (70.3 kg).  Her oral temperature is 98.1 F (36.7 C). Her blood pressure is 123/59 (abnormal) and her pulse is 60. Her respiration is 16.   [name]. She has no ocular or oral lesions. There is no adenopathy in the neck. Her lungs are clear. Cardiac exam regular rate and rhythm. Skin exam shows seborrheic keratoses. Back exam shows kyphosis. Breast exam shows a left breast with a well-healed lumpectomy. This is at the 4:00 position. She has no left breast mass. there is no left axillary adenopathy. Right breast shows a lumpectomy of the 9:00 position. The biopsy site is well-healed. There is no right breast masses the right axillary adenopathy. Abdomen is soft. She has good bowel sounds. There is no abdominal mass. There is no palpable liver or spleen. Back exam shows some kyphosis. There is no tenderness over the spine. Extremities do show some chronic edema in the legs. She has minimal swelling in her knees. She has well-healed surgical scars with her knees. Neurological exam nonfocal.  Lab Results  Component Value Date   WBC 7.7 06/16/2016   HGB 14.6 06/16/2016   HCT 42.6 06/16/2016   MCV 93 06/16/2016   PLT 203 06/16/2016     Chemistry      Component Value Date/Time   NA 139 04/03/2016 1459   NA 138 06/18/2015 1416   K 4.0 04/03/2016 1459   K 4.2 06/18/2015 1416   CL 102 04/03/2016 1459   CL 100 06/18/2015 1416   CO2 27 04/03/2016 1459   CO2 26 06/18/2015 1416   BUN 13 04/03/2016 1459   BUN 15 06/18/2015 1416   CREATININE 0.84 04/03/2016 1459      Component Value Date/Time   CALCIUM 8.9 04/03/2016 1459   CALCIUM 9.4 06/18/2015 1416   ALKPHOS 56 04/03/2016 1459   ALKPHOS 59 06/18/2015 1416   AST 27 04/03/2016 1459   AST 37 06/18/2015 1416   ALT 22 04/03/2016 1459   ALT 36 06/18/2015 1416   BILITOT 0.4 04/03/2016 1459   BILITOT 0.70 06/18/2015 1416         Impression and Plan: Hannah Galvan is an 82 year old white female. She is history of 2 separate breast primaries. She  had right breast cancer that that was invasive.. This was diagnosed back in 2000. She had ductal carcinoma in situ of the left breast. This was diagnosed in 2007. Currently, I do not see any issues with either of these.  I feel bad that she is going through a hard time right now. She lost her son tragically couple years ago. It has now been 3 years since her husband passed away.Marland Kitchen  I  We will plan to get her back in another one year.   Hannah Napoleon, MD 7/24/20173:27 PM

## 2016-06-19 LAB — VITAMIN D 25 HYDROXY (VIT D DEFICIENCY, FRACTURES): Vitamin D, 25-Hydroxy: 50.6 ng/mL (ref 30.0–100.0)

## 2016-06-27 ENCOUNTER — Ambulatory Visit (INDEPENDENT_AMBULATORY_CARE_PROVIDER_SITE_OTHER): Payer: Medicare Other | Admitting: Family Medicine

## 2016-06-27 VITALS — BP 168/84 | HR 60 | Temp 98.2°F | Resp 16 | Wt 155.0 lb

## 2016-06-27 DIAGNOSIS — M7989 Other specified soft tissue disorders: Secondary | ICD-10-CM

## 2016-06-27 DIAGNOSIS — I10 Essential (primary) hypertension: Secondary | ICD-10-CM | POA: Diagnosis not present

## 2016-06-27 MED ORDER — FUROSEMIDE 20 MG PO TABS: 20.0000 mg | ORAL_TABLET | Freq: Every day | ORAL | 3 refills | Status: DC

## 2016-06-27 NOTE — Progress Notes (Signed)
Subjective:    Patient ID: Hannah Galvan, female    DOB: 05-27-1931, 82 y.o.   MRN: [id number]  HPI5/11/17 Patient is here today complaining of fatigue. She has no energy. She always feels tired. She denies feeling sleepy. She states that she sleeps well for at least 7 hours every evening. She also reports a dull headache is located in her for head which sounds like a tension headache. She denies any weight loss. In fact her weight is unchanged since February. She denies any fevers or chills or recent illness. She denies any cough, shortness of breath, chest pain, dyspnea on exertion. She denies any nausea vomiting or diarrhea. She denies any blood in her stool. She denies any dysuria or hematuria. She denies any bleeding or bruising. Was seen by the cardiologist in March complaining of fatigue and a decrease the dose of her losartan by 50% however this has not helped her fatigue. Of note, her husband's birthday was yesterday. This weekend is Mother's Day. Her son's, who died, birthday is next month. She does report feeling lonely. She lives alone. She states that her family doesn't visit is much as they should.  04/25/16 Labs were unremarkable. Patient continues to suffer from depression and anxiety. She is tearful today on our encounter. The patient found her son when he was killed. He was crushed by a building which collapsed on him that he was working on. The patient still sees his face at night when she tries to sleep. She has difficult time sleeping. The Lexapro and Ativan did not seem to be helping. She freely admits that she seldom sees her son who is still living. Therefore she feels isolated and lonely. She denies any suicidal ideation but she certainly suffers from anhedonia, poor concentration, decreased energy, and insomnia.  AT that time, my plan was: She has tried and failed a combination of Wellbutrin, Lexapro, Lexapro alone, Lexapro and Ativan. Therefore I will have the patient discontinue  Lexapro and replaced with Trintellix 5 mg poqday and increased to 10 mg a day in one week. I walked so consult a psychologist to arrange outpatient counseling and cognitive behavioral therapy. I will see the patient back in 3-4 weeks to assess for improvement on telemetry sooner if symptoms worsen  05/16/16 Patient feels like she is doing better on Trintellix 10 mg a day. She would like to continue that. However she reports a dull headache every morning when she wakes up. She states that this predates the medication as it has  been going on possibly the last 2 months. However it seems to be getting worse.   She had a CT scan of the head in November that was normal. The headache is located just above her eyebrows. It is dull and constant and pressure-like. She denies any vision changes. She denies any blurry vision. She denies any double vision. She denies any sinus pain or rhinorrhea or sore throat or postnasal drip or otalgia. She denies any head trauma. She has no history of migraines. This is the third visit in a row her blood pressure is high.  At that time, my plan was: I believe the headaches are due to her elevated blood pressure. Add Bystolic 10 mg a day and recheck blood pressure here in one week and reassess the headaches for improvement. It is also possible she is having tension headaches. Meanwhile continue Trintellix 10 mg a day. The patient was given samples of these medications. Also consider sleep apnea as  a possible cause of early morning headache  05/26/16 He is here today for recheck. Her blood pressure still elevated however her family states that she became confused and stopped all blood pressure medication prior to starting the new blood pressure medication. She has only been on both medications for for 5 days. However the blood pressure still significantly elevated. She continues to have daily headache located above both eyes. The headache is a pressure-like sensation which is constant.  Her son is also concerned about memory problems that she's having. I performed a Mini-Mental status exam today. The patient was unable to tell me the year correctly. She remembered 2 out of 3 objects on rapid recall. She was able to get 3 out of 5 letters on spelling "world" backwards. She was unable to draw intersecting pentagons. I also had the patient perform a clock drawing diagram. I asked the patient to draw a clock and make the clock say for 45. She placed a 4 at the appropriate position on the clock with no other numbers and wrote 45 in the location of benign but was unable to draw the hands of the clock. She also demonstrates some difficulty in remembering names of objects. Therefore the patient scores 25 out of 30 on Mini-Mental status exam consistent with mild dementia.  At that time, my plan was: Add amlodipine 5 mg by mouth daily to her losartan and bystolic.  Given the headache and the memory problems, I'll schedule the patient for an MRI as I'm concerned that she may have had a small stroke. Await the results of MRI prior to starting the patient on Aricept. Continue Trintellix 10 mg poqday.  06/27/16 Patient's blood pressure still elevated today. She is currently on losartan, amlodipine, and bystolic.  She also reports palpitations in her chest every night to keep her from sleeping. Cardiology workup was performed last year showed a normal Myoview stress test as well as a monitor revealing occasional PACs. I believe she is having occasional PACs and PVCs causing the palpitations. She also complains of leg swelling distal to her knee. She has +1 pedal edema and trace pretibial edema Past Medical History:  Diagnosis Date  . Allergy   . Anxiety   . Arthritis   . Blood transfusion without reported diagnosis   . Cancer ([place])    breast, s/p RXT, bilateral  . Cataract   . History of echocardiogram    a. Echo 8/16: Vigorous LVF, EF Q000111Q, grade 1 diastolic dysfunction, trivial AI, mild MR, mild  RVE, normal RV function, PASP 33 mmHg // b. Echo 1/17 EF 65-70%, normal wall motion, grade 1 diastolic dysfunction, trivial AI, MAC, mild MR  . History of stress test    Lexiscan Myoview 8/16:  EF 81%, breast attenuation, no ischemia; Low Risk  . Hyperlipidemia   . Hypertension   . Hypothyroidism   . Shortness of breath   . Thyroid disease    Past Surgical History:  Procedure Laterality Date  . ABDOMINAL HYSTERECTOMY  2008  . bladder tact    . BREAST LUMPECTOMY  2000  . BREAST LUMPECTOMY  2007  . CATARACT EXTRACTION  2011  . JOINT REPLACEMENT    . KNEE SURGERY     right  . ROTATOR CUFF REPAIR     right  . THYROID SURGERY  2002  . TOTAL KNEE ARTHROPLASTY Left 06/02/2013   Dr Rhona Raider  . TOTAL KNEE ARTHROPLASTY Right 06/02/2013   Procedure: RIGHT TOTAL KNEE ARTHROPLASTY;  Surgeon: Collier Salina  Autumn Patty, MD;  Location: [place];  Service: Orthopedics;  Laterality: Right;  . TOTAL KNEE ARTHROPLASTY Left 08/22/2014   Procedure: TOTAL KNEE ARTHROPLASTY;  Surgeon: Hessie Dibble, MD;  Location: [place];  Service: Orthopedics;  Laterality: Left;   Current Outpatient Prescriptions on File Prior to Visit  Medication Sig Dispense Refill  . amLODipine (NORVASC) 5 MG tablet Take 1 tablet (5 mg total) by mouth daily. 90 tablet 3  . aspirin 81 MG tablet Take 81 mg by mouth daily.    . Calcium Carbonate-Vitamin D (CALCIUM + D) 600-200 MG-UNIT TABS Take 1 tablet by mouth 2 (two) times daily.      . cetirizine (ZYRTEC) 10 MG tablet Take 1 tablet (10 mg total) by mouth daily. 30 tablet 11  . Cranberry 250 MG CAPS Take 250 mg by mouth daily.    Marland Kitchen diltiazem (CARDIZEM) 30 MG tablet Take 1 tablet (30 mg total) by mouth daily as needed. Take as needed for palpitations 90 tablet 1  . exemestane (AROMASIN) 25 MG tablet TAKE 1 TABLET EVERY DAY 90 tablet 2  . fluticasone (FLONASE) 50 MCG/ACT nasal spray Place 2 sprays into both nostrils daily. 16 g 6  . folic acid (FOLVITE) 1 MG tablet Take 1 mg by mouth daily.      . Ipratropium-Albuterol (COMBIVENT) 20-100 MCG/ACT AERS respimat Inhale 1 puff into the lungs every 6 (six) hours as needed for wheezing. 2 Inhaler 1  . levothyroxine (SYNTHROID) 125 MCG tablet Take 1 tablet (125 mcg total) by mouth daily. 90 tablet 1  . LORazepam (ATIVAN) 1 MG tablet TAKE 1 TABLET BY MOUTH IN THE MORNING & 1 TAB 30 MIN PRIOR TO BEDTIME 60 tablet 3  . losartan (COZAAR) 50 MG tablet Take 1 tablet (50 mg total) by mouth daily. 90 tablet 4  . Multiple Vitamins-Minerals (CENTRUM SILVER ADULT 50+ PO) Take 1 tablet by mouth daily.     . nebivolol (BYSTOLIC) 10 MG tablet Take 10 mg by mouth daily.    . nitroGLYCERIN (NITROSTAT) 0.4 MG SL tablet Place 1 tablet (0.4 mg total) under the tongue every 5 (five) minutes as needed for chest pain. 25 tablet 3  . pantoprazole (PROTONIX) 40 MG tablet Take 1 tablet (40 mg total) by mouth as directed. 40 MG DAILY FOR 1 MONTH THEN AS NEEDED 90 tablet 3  . potassium chloride SA (K-DUR,KLOR-CON) 20 MEQ tablet Take 1 tablet (20 mEq total) by mouth daily. 90 tablet 1  . rosuvastatin (CRESTOR) 10 MG tablet Take 1 tablet (10 mg total) by mouth daily at 6 PM. 90 tablet 4  . vortioxetine HBr (TRINTELLIX) 10 MG TABS Take by mouth.     No current facility-administered medications on file prior to visit.    Allergies  Allergen Reactions  . Ambien [Zolpidem]     confusion  . Pyridium [Phenazopyridine Hcl] Other (See Comments)    hemolysis  . Sulfa Antibiotics Rash   Social History   Social History  . Marital status: Widowed    Spouse name: N/A  . Number of children: N/A  . Years of education: N/A   Occupational History  . Not on file.   Social History Main Topics  . Smoking status: Never Smoker  . Smokeless tobacco: Never Used     Comment: never used tobacco  . Alcohol use No  . Drug use: No  . Sexual activity: Not Currently   Other Topics Concern  . Not on file   Social History  Narrative  . No narrative on file      Review of  Systems  All other systems reviewed and are negative.      Objective:   Physical Exam  Constitutional: She is oriented to person, place, and time. She appears well-developed and well-nourished. No distress.  Neck: Neck supple. No JVD present. No thyromegaly present.  Cardiovascular: Normal rate and regular rhythm.   Murmur heard. Pulmonary/Chest: Effort normal and breath sounds normal. No respiratory distress. She has no wheezes. She has no rales.  Abdominal: Soft. Bowel sounds are normal.  Musculoskeletal: She exhibits no edema.  Lymphadenopathy:    She has no cervical adenopathy.  Neurological: She is alert and oriented to person, place, and time. No cranial nerve deficit. She exhibits normal muscle tone.  Skin: She is not diaphoretic.  Vitals reviewed.         Assessment & Plan:  Essential hypertension  Leg swelling  Leg swelling is most likely due to amlodipine. She can use Lasix 20 mg a day as needed for leg swelling. Because of that reason I will not increase amlodipine to a higher dose. However her blood pressure still elevated. Therefore I will increase her bystolic to 20 mg a day.  I gave her samples of 10 mg tablets and told her to take 2 of them a day. Hopefully this will help some the PACs and PVCs she is experiencing at night as well

## 2016-08-08 ENCOUNTER — Telehealth: Payer: Self-pay | Admitting: *Deleted

## 2016-08-08 NOTE — Telephone Encounter (Signed)
Patient called requesting something for a cold to be called into [facility]. Please advise [phone]

## 2016-08-10 ENCOUNTER — Other Ambulatory Visit: Payer: Self-pay | Admitting: Hematology & Oncology

## 2016-08-10 DIAGNOSIS — J45909 Unspecified asthma, uncomplicated: Secondary | ICD-10-CM

## 2016-08-11 NOTE — Telephone Encounter (Signed)
Patient aware of OTC recommendations and states that she has an appt for tomorrow and would like to know if it can be moved to an earlier time. Appt moved and pt aware.

## 2016-08-12 ENCOUNTER — Ambulatory Visit (INDEPENDENT_AMBULATORY_CARE_PROVIDER_SITE_OTHER): Payer: Medicare Other | Admitting: Family Medicine

## 2016-08-12 ENCOUNTER — Ambulatory Visit: Payer: Medicare Other | Admitting: Family Medicine

## 2016-08-12 VITALS — BP 130/70 | HR 80 | Temp 98.2°F | Resp 16 | Wt 152.0 lb

## 2016-08-12 DIAGNOSIS — F32A Depression, unspecified: Secondary | ICD-10-CM

## 2016-08-12 DIAGNOSIS — R05 Cough: Secondary | ICD-10-CM

## 2016-08-12 DIAGNOSIS — F329 Major depressive disorder, single episode, unspecified: Secondary | ICD-10-CM

## 2016-08-12 DIAGNOSIS — F411 Generalized anxiety disorder: Secondary | ICD-10-CM | POA: Diagnosis not present

## 2016-08-12 DIAGNOSIS — R059 Cough, unspecified: Secondary | ICD-10-CM

## 2016-08-12 MED ORDER — BENZONATATE 100 MG PO CAPS: 100.0000 mg | ORAL_CAPSULE | Freq: Three times a day (TID) | ORAL | 0 refills | Status: DC | PRN

## 2016-08-12 MED ORDER — CLONAZEPAM 1 MG PO TABS: 1.0000 mg | ORAL_TABLET | Freq: Two times a day (BID) | ORAL | 1 refills | Status: DC | PRN

## 2016-08-12 NOTE — Progress Notes (Signed)
Subjective:    Patient ID: Hannah Galvan, female    DOB: 1930-12-30, 82 y.o.   MRN: [id number]  HPI5/11/17 Patient is here today complaining of fatigue. She has no energy. She always feels tired. She denies feeling sleepy. She states that she sleeps well for at least 7 hours every evening. She also reports a dull headache is located in her for head which sounds like a tension headache. She denies any weight loss. In fact her weight is unchanged since February. She denies any fevers or chills or recent illness. She denies any cough, shortness of breath, chest pain, dyspnea on exertion. She denies any nausea vomiting or diarrhea. She denies any blood in her stool. She denies any dysuria or hematuria. She denies any bleeding or bruising. Was seen by the cardiologist in March complaining of fatigue and a decrease the dose of her losartan by 50% however this has not helped her fatigue. Of note, her husband's birthday was yesterday. This weekend is Mother's Day. Her son's, who died, birthday is next month. She does report feeling lonely. She lives alone. She states that her family doesn't visit is much as they should.  04/25/16 Labs were unremarkable. Patient continues to suffer from depression and anxiety. She is tearful today on our encounter. The patient found her son when he was killed. He was crushed by a building which collapsed on him that he was working on. The patient still sees his face at night when she tries to sleep. She has difficult time sleeping. The Lexapro and Ativan did not seem to be helping. She freely admits that she seldom sees her son who is still living. Therefore she feels isolated and lonely. She denies any suicidal ideation but she certainly suffers from anhedonia, poor concentration, decreased energy, and insomnia.  AT that time, my plan was: She has tried and failed a combination of Wellbutrin, Lexapro, Lexapro alone, Lexapro and Ativan. Therefore I will have the patient discontinue  Lexapro and replaced with Trintellix 5 mg poqday and increased to 10 mg a day in one week. I walked so consult a psychologist to arrange outpatient counseling and cognitive behavioral therapy. I will see the patient back in 3-4 weeks to assess for improvement on telemetry sooner if symptoms worsen  05/16/16 Patient feels like she is doing better on Trintellix 10 mg a day. She would like to continue that. However she reports a dull headache every morning when she wakes up. She states that this predates the medication as it has  been going on possibly the last 2 months. However it seems to be getting worse.   She had a CT scan of the head in November that was normal. The headache is located just above her eyebrows. It is dull and constant and pressure-like. She denies any vision changes. She denies any blurry vision. She denies any double vision. She denies any sinus pain or rhinorrhea or sore throat or postnasal drip or otalgia. She denies any head trauma. She has no history of migraines. This is the third visit in a row her blood pressure is high.  At that time, my plan was: I believe the headaches are due to her elevated blood pressure. Add Bystolic 10 mg a day and recheck blood pressure here in one week and reassess the headaches for improvement. It is also possible she is having tension headaches. Meanwhile continue Trintellix 10 mg a day. The patient was given samples of these medications. Also consider sleep apnea as  a possible cause of early morning headache  05/26/16 He is here today for recheck. Her blood pressure still elevated however her family states that she became confused and stopped all blood pressure medication prior to starting the new blood pressure medication. She has only been on both medications for for 5 days. However the blood pressure still significantly elevated. She continues to have daily headache located above both eyes. The headache is a pressure-like sensation which is constant.  Her son is also concerned about memory problems that she's having. I performed a Mini-Mental status exam today. The patient was unable to tell me the year correctly. She remembered 2 out of 3 objects on rapid recall. She was able to get 3 out of 5 letters on spelling "world" backwards. She was unable to draw intersecting pentagons. I also had the patient perform a clock drawing diagram. I asked the patient to draw a clock and make the clock say for 45. She placed a 4 at the appropriate position on the clock with no other numbers and wrote 45 in the location of benign but was unable to draw the hands of the clock. She also demonstrates some difficulty in remembering names of objects. Therefore the patient scores 25 out of 30 on Mini-Mental status exam consistent with mild dementia.  At that time, my plan was: Add amlodipine 5 mg by mouth daily to her losartan and bystolic.  Given the headache and the memory problems, I'll schedule the patient for an MRI as I'm concerned that she may have had a small stroke. Await the results of MRI prior to starting the patient on Aricept. Continue Trintellix 10 mg poqday.  06/27/16 Patient's blood pressure still elevated today. She is currently on losartan, amlodipine, and bystolic.  She also reports palpitations in her chest every night to keep her from sleeping. Cardiology workup was performed last year showed a normal Myoview stress test as well as a monitor revealing occasional PACs. I believe she is having occasional PACs and PVCs causing the palpitations. She also complains of leg swelling distal to her knee. She has +1 pedal edema and trace pretibial edema AT that time, my plan was: Leg swelling is most likely due to amlodipine. She can use Lasix 20 mg a day as needed for leg swelling. Because of that reason I will not increase amlodipine to a higher dose. However her blood pressure still elevated. Therefore I will increase her bystolic to 20 mg a day.  I gave her samples  of 10 mg tablets and told her to take 2 of them a day. Hopefully this will help some the PACs and PVCs she is experiencing at night as well  08/12/16 Patient is here today asking for something for her anxiety. She is currently on Trintellix 10 mg a day. She is taking Ativan 1 mg in the morning and 1 mg at night. The patient states that she wakes up extremely anxious in the morning. She takes her "nerve pill" and symptoms improved for about 3 hours and then the anxiety worsens again. She takes her next nerve pill at night and she is able to sleep however in the time in between she feels extremely anxious. She has been on Ativan for a long time. She also reports a nonproductive cough for the last 3 or 4 days. She denies any fever or shortness of breath or pleurisy. Past Medical History:  Diagnosis Date  . Allergy   . Anxiety   . Arthritis   .  Blood transfusion without reported diagnosis   . Cancer ([place])    breast, s/p RXT, bilateral  . Cataract   . History of echocardiogram    a. Echo 8/16: Vigorous LVF, EF Q000111Q, grade 1 diastolic dysfunction, trivial AI, mild MR, mild RVE, normal RV function, PASP 33 mmHg // b. Echo 1/17 EF 65-70%, normal wall motion, grade 1 diastolic dysfunction, trivial AI, MAC, mild MR  . History of stress test    Lexiscan Myoview 8/16:  EF 81%, breast attenuation, no ischemia; Low Risk  . Hyperlipidemia   . Hypertension   . Hypothyroidism   . Shortness of breath   . Thyroid disease    Past Surgical History:  Procedure Laterality Date  . ABDOMINAL HYSTERECTOMY  2008  . bladder tact    . BREAST LUMPECTOMY  2000  . BREAST LUMPECTOMY  2007  . CATARACT EXTRACTION  2011  . JOINT REPLACEMENT    . KNEE SURGERY     right  . ROTATOR CUFF REPAIR     right  . THYROID SURGERY  2002  . TOTAL KNEE ARTHROPLASTY Left 06/02/2013   Dr Rhona Raider  . TOTAL KNEE ARTHROPLASTY Right 06/02/2013   Procedure: RIGHT TOTAL KNEE ARTHROPLASTY;  Surgeon: Hessie Dibble, MD;  Location: [place];  Service: Orthopedics;  Laterality: Right;  . TOTAL KNEE ARTHROPLASTY Left 08/22/2014   Procedure: TOTAL KNEE ARTHROPLASTY;  Surgeon: Hessie Dibble, MD;  Location: [place];  Service: Orthopedics;  Laterality: Left;   Current Outpatient Prescriptions on File Prior to Visit  Medication Sig Dispense Refill  . amLODipine (NORVASC) 5 MG tablet Take 1 tablet (5 mg total) by mouth daily. 90 tablet 3  . aspirin 81 MG tablet Take 81 mg by mouth daily.    . Calcium Carbonate-Vitamin D (CALCIUM + D) 600-200 MG-UNIT TABS Take 1 tablet by mouth 2 (two) times daily.      . cetirizine (ZYRTEC) 10 MG tablet Take 1 tablet (10 mg total) by mouth daily. 30 tablet 11  . COMBIVENT RESPIMAT 20-100 MCG/ACT AERS respimat INHALE 1 PUFF INTO THE LUGNS EVERY 6 HOURS AS NEEDED FOR WHEEZING 2 Inhaler 0  . Cranberry 250 MG CAPS Take 250 mg by mouth daily.    Marland Kitchen diltiazem (CARDIZEM) 30 MG tablet Take 1 tablet (30 mg total) by mouth daily as needed. Take as needed for palpitations 90 tablet 1  . exemestane (AROMASIN) 25 MG tablet TAKE 1 TABLET EVERY DAY 90 tablet 2  . fluticasone (FLONASE) 50 MCG/ACT nasal spray Place 2 sprays into both nostrils daily. 16 g 6  . folic acid (FOLVITE) 1 MG tablet Take 1 mg by mouth daily.    . furosemide (LASIX) 20 MG tablet Take 1 tablet (20 mg total) by mouth daily. Can take 1 pill a day as needed for swelling 30 tablet 3  . levothyroxine (SYNTHROID) 125 MCG tablet Take 1 tablet (125 mcg total) by mouth daily. 90 tablet 1  . LORazepam (ATIVAN) 1 MG tablet TAKE 1 TABLET BY MOUTH IN THE MORNING & 1 TAB 30 MIN PRIOR TO BEDTIME 60 tablet 3  . losartan (COZAAR) 50 MG tablet Take 1 tablet (50 mg total) by mouth daily. 90 tablet 4  . Multiple Vitamins-Minerals (CENTRUM SILVER ADULT 50+ PO) Take 1 tablet by mouth daily.     . nebivolol (BYSTOLIC) 10 MG tablet Take 10 mg by mouth daily.    . nitroGLYCERIN (NITROSTAT) 0.4 MG SL tablet Place 1 tablet (0.4 mg total) under  the tongue every 5 (five)  minutes as needed for chest pain. 25 tablet 3  . pantoprazole (PROTONIX) 40 MG tablet Take 1 tablet (40 mg total) by mouth as directed. 40 MG DAILY FOR 1 MONTH THEN AS NEEDED 90 tablet 3  . potassium chloride SA (K-DUR,KLOR-CON) 20 MEQ tablet Take 1 tablet (20 mEq total) by mouth daily. 90 tablet 1  . rosuvastatin (CRESTOR) 10 MG tablet Take 1 tablet (10 mg total) by mouth daily at 6 PM. 90 tablet 4  . vortioxetine HBr (TRINTELLIX) 10 MG TABS Take by mouth.     No current facility-administered medications on file prior to visit.    Allergies  Allergen Reactions  . Ambien [Zolpidem]     confusion  . Pyridium [Phenazopyridine Hcl] Other (See Comments)    hemolysis  . Sulfa Antibiotics Rash   Social History   Social History  . Marital status: Widowed    Spouse name: N/A  . Number of children: N/A  . Years of education: N/A   Occupational History  . Not on file.   Social History Main Topics  . Smoking status: Never Smoker  . Smokeless tobacco: Never Used     Comment: never used tobacco  . Alcohol use No  . Drug use: No  . Sexual activity: Not Currently   Other Topics Concern  . Not on file   Social History Narrative  . No narrative on file      Review of Systems  All other systems reviewed and are negative.      Objective:   Physical Exam  Constitutional: She is oriented to person, place, and time. She appears well-developed and well-nourished. No distress.  Neck: Neck supple. No JVD present. No thyromegaly present.  Cardiovascular: Normal rate and regular rhythm.   Murmur heard. Pulmonary/Chest: Effort normal and breath sounds normal. No respiratory distress. She has no wheezes. She has no rales.  Abdominal: Soft. Bowel sounds are normal.  Musculoskeletal: She exhibits no edema.  Lymphadenopathy:    She has no cervical adenopathy.  Neurological: She is alert and oriented to person, place, and time. No cranial nerve deficit. She exhibits normal muscle tone.    Skin: She is not diaphoretic.  Vitals reviewed.         Assessment & Plan:  I believe her cough is due to a viral upper respiratory infection. I recommended tincture of time. Use Tessalon 100 mg every 8 hours as needed for cough. I believe the patient has become habituated to her Ativan. Therefore I recommended discontinuing the Ativan and switching to Klonopin 1 mg twice daily as needed. Hopefully because the patient is nave to this medication it will be more effective. Also I believe it may be more effective because of its longer half-life. Continue Trintellix 10 mg a day.

## 2016-08-14 ENCOUNTER — Telehealth: Payer: Self-pay | Admitting: Family Medicine

## 2016-08-14 NOTE — Telephone Encounter (Signed)
Pt called and states that she is having pain in her groin area and would like something called in. Also she would like something to take to "dry" her chest up as she is still coughing really bad.

## 2016-08-15 NOTE — Telephone Encounter (Signed)
LMTRC

## 2016-08-15 NOTE — Telephone Encounter (Signed)
I am not sure why she is having pain in her groin, would need to examine her in order to know what to prescribe.  Exam of her lungs was normal.  However, if worsening, prescribe zpack.

## 2016-08-16 MED ORDER — AZITHROMYCIN 250 MG PO TABS: ORAL_TABLET | ORAL | 0 refills | Status: DC

## 2016-08-16 NOTE — Telephone Encounter (Signed)
Called and spoke to pt - she thinks she has a yeast infection - informed to get otc and if no better call us back. Also pt reports no better in chest - antib sent to pharm

## 2016-08-18 ENCOUNTER — Ambulatory Visit (INDEPENDENT_AMBULATORY_CARE_PROVIDER_SITE_OTHER): Payer: Medicare Other | Admitting: Physician Assistant

## 2016-08-18 ENCOUNTER — Encounter: Payer: Self-pay | Admitting: Physician Assistant

## 2016-08-18 VITALS — BP 124/70 | HR 79 | Temp 97.4°F | Resp 16 | Wt 158.0 lb

## 2016-08-18 DIAGNOSIS — B379 Candidiasis, unspecified: Secondary | ICD-10-CM

## 2016-08-18 MED ORDER — FLUCONAZOLE 150 MG PO TABS: ORAL_TABLET | ORAL | 0 refills | Status: DC

## 2016-08-18 NOTE — Progress Notes (Signed)
Patient ID: Hannah Galvan MRN: [id number], DOB: 1931-01-14, 82 y.o. Date of Encounter: 08/18/2016, 12:39 PM    Chief Complaint:  Chief Complaint  Patient presents with  . office visit    vagina pain      HPI: 82 y.o. year old white female says she has been having itching and burning discomfort in her suprapubic region and groin region and vaginal region. No other complaints or concerns today. Has been applying hydrocortisone cream but it is not resolving.     Home Meds:   Outpatient Medications Prior to Visit  Medication Sig Dispense Refill  . amLODipine (NORVASC) 5 MG tablet Take 1 tablet (5 mg total) by mouth daily. 90 tablet 3  . aspirin 81 MG tablet Take 81 mg by mouth daily.    Marland Kitchen azithromycin (ZITHROMAX) 250 MG tablet 2 tabs po x 1 day; 1 tab po qd x days 2-5 6 tablet 0  . benzonatate (TESSALON PERLES) 100 MG capsule Take 1 capsule (100 mg total) by mouth 3 (three) times daily as needed for cough. 30 capsule 0  . Calcium Carbonate-Vitamin D (CALCIUM + D) 600-200 MG-UNIT TABS Take 1 tablet by mouth 2 (two) times daily.      . cetirizine (ZYRTEC) 10 MG tablet Take 1 tablet (10 mg total) by mouth daily. 30 tablet 11  . clonazePAM (KLONOPIN) 1 MG tablet Take 1 tablet (1 mg total) by mouth 2 (two) times daily as needed for anxiety (stop lorazepam). 60 tablet 1  . COMBIVENT RESPIMAT 20-100 MCG/ACT AERS respimat INHALE 1 PUFF INTO THE LUGNS EVERY 6 HOURS AS NEEDED FOR WHEEZING 2 Inhaler 0  . Cranberry 250 MG CAPS Take 250 mg by mouth daily.    Marland Kitchen diltiazem (CARDIZEM) 30 MG tablet Take 1 tablet (30 mg total) by mouth daily as needed. Take as needed for palpitations 90 tablet 1  . exemestane (AROMASIN) 25 MG tablet TAKE 1 TABLET EVERY DAY 90 tablet 2  . fluticasone (FLONASE) 50 MCG/ACT nasal spray Place 2 sprays into both nostrils daily. 16 g 6  . folic acid (FOLVITE) 1 MG tablet Take 1 mg by mouth daily.    . furosemide (LASIX) 20 MG tablet Take 1 tablet (20 mg total) by mouth  daily. Can take 1 pill a day as needed for swelling 30 tablet 3  . levothyroxine (SYNTHROID) 125 MCG tablet Take 1 tablet (125 mcg total) by mouth daily. 90 tablet 1  . LORazepam (ATIVAN) 1 MG tablet TAKE 1 TABLET BY MOUTH IN THE MORNING & 1 TAB 30 MIN PRIOR TO BEDTIME 60 tablet 3  . losartan (COZAAR) 50 MG tablet Take 1 tablet (50 mg total) by mouth daily. 90 tablet 4  . Multiple Vitamins-Minerals (CENTRUM SILVER ADULT 50+ PO) Take 1 tablet by mouth daily.     . nebivolol (BYSTOLIC) 10 MG tablet Take 10 mg by mouth daily.    . nitroGLYCERIN (NITROSTAT) 0.4 MG SL tablet Place 1 tablet (0.4 mg total) under the tongue every 5 (five) minutes as needed for chest pain. 25 tablet 3  . pantoprazole (PROTONIX) 40 MG tablet Take 1 tablet (40 mg total) by mouth as directed. 40 MG DAILY FOR 1 MONTH THEN AS NEEDED 90 tablet 3  . potassium chloride SA (K-DUR,KLOR-CON) 20 MEQ tablet Take 1 tablet (20 mEq total) by mouth daily. 90 tablet 1  . rosuvastatin (CRESTOR) 10 MG tablet Take 1 tablet (10 mg total) by mouth daily at 6 PM. 90 tablet 4  .  vortioxetine HBr (TRINTELLIX) 10 MG TABS Take by mouth.     No facility-administered medications prior to visit.     Allergies:  Allergies  Allergen Reactions  . Ambien [Zolpidem]     confusion  . Pyridium [Phenazopyridine Hcl] Other (See Comments)    hemolysis  . Sulfa Antibiotics Rash      Review of Systems: See HPI for pertinent ROS. All other ROS negative.    Physical Exam: Blood pressure 124/70, pulse 79, temperature 97.4 F (36.3 C), resp. rate 16, weight 158 lb (71.7 kg)., Body mass index is 28.44 kg/m. General:  WNWD WF. Appears in no acute distress. Neck: Supple. No thyromegaly. No lymphadenopathy. Lungs: Clear bilaterally to auscultation without wheezes, rales, or rhonchi. Breathing is unlabored. Heart: Regular rhythm. No murmurs, rubs, or gallops. Msk:  Strength and tone normal for age. Skin: She has diffuse erythema over her suprapubic  region. Increased deeper red erythema in the crease/skin fold of paniculus and in groin skin folds bilaterally. There are satellite lesions around the periphery of this. External vulva region appears slightly swollen and with diffuse erythema. Neuro: Alert and oriented X 3. Moves all extremities spontaneously. Gait is normal. CNII-XII grossly in tact. Psych:  Responds to questions appropriately with a normal affect.     ASSESSMENT AND PLAN:  82 y.o. year old female with  1. Candidiasis Told her to stop the hydrocortisone cream. Discussed the cause of this is moisture in skin folds. Discussed the need to make sure that she dries these areas very well. After drying with a towel may need to use a hair dryer or a fan to further dry. She is to take 1 Diflucan today and take another Diflucan on Wednesday 08/13/09/17. F/U PRN - fluconazole (DIFLUCAN) 150 MG tablet; Take 1 today. Take 2nd one on Wednesday (in 2 days)  Dispense: 2 tablet; Refill: 0   Signed, [address] [place], [place], [facility] 08/18/2016 12:39 PM

## 2016-08-22 ENCOUNTER — Telehealth: Payer: Self-pay | Admitting: *Deleted

## 2016-08-22 NOTE — Telephone Encounter (Signed)
NTBS for pelvic exam.  If diflucan x 2 has not worked its not Careers information officer

## 2016-08-22 NOTE — Telephone Encounter (Signed)
Call placed to patient and patient made aware.   Appointment scheduled.  

## 2016-08-22 NOTE — Telephone Encounter (Signed)
Call placed to patient. No answer. No VM.  

## 2016-08-22 NOTE — Telephone Encounter (Signed)
Received call from patient.   Reports that she has completed Diflucan X2 and has been drying self well with hair dryer, but itching and burning discomfort in her suprapubic region and groin region has not resolved.   Requested another round of Diflucan.   MD please advise.

## 2016-08-25 ENCOUNTER — Ambulatory Visit (INDEPENDENT_AMBULATORY_CARE_PROVIDER_SITE_OTHER): Payer: Medicare Other | Admitting: Family Medicine

## 2016-08-25 VITALS — BP 130/70 | HR 98 | Temp 98.1°F | Resp 14 | Wt 154.0 lb

## 2016-08-25 DIAGNOSIS — R21 Rash and other nonspecific skin eruption: Secondary | ICD-10-CM | POA: Diagnosis not present

## 2016-08-25 DIAGNOSIS — Z23 Encounter for immunization: Secondary | ICD-10-CM | POA: Diagnosis not present

## 2016-08-25 DIAGNOSIS — B379 Candidiasis, unspecified: Secondary | ICD-10-CM | POA: Diagnosis not present

## 2016-08-25 MED ORDER — NYSTATIN 100000 UNIT/GM EX OINT: 1.0000 "application " | TOPICAL_OINTMENT | Freq: Two times a day (BID) | CUTANEOUS | 0 refills | Status: DC

## 2016-08-25 MED ORDER — MOMETASONE FUROATE 0.1 % EX OINT: TOPICAL_OINTMENT | Freq: Every day | CUTANEOUS | 0 refills | Status: DC

## 2016-08-25 NOTE — Addendum Note (Signed)
Addended by: Shary Decamp B on: 08/25/2016 04:33 PM   Modules accepted: Orders

## 2016-08-25 NOTE — Progress Notes (Signed)
Subjective:    Patient ID: Hannah Galvan, female    DOB: 1931-04-12, 82 y.o.   MRN: [id number]  HPI Patient reports itching and burning around her vagina for the last 2-3 weeks. She states that the skin around the vagina is raw and erythematous. She was given 2 separate pills for Diflucan by my partner but this is providing her no relief. I recommended that she come back in so that I could visually inspect the skin to determine what the etiology is. She denies any bleeding. She denies any vaginal bleeding. She denies any dysuria. She denies any fever. However she states that she just doesn't feel well. The skin itches and burns. Past Medical History:  Diagnosis Date  . Allergy   . Anxiety   . Arthritis   . Blood transfusion without reported diagnosis   . Cancer ([place])    breast, s/p RXT, bilateral  . Cataract   . History of echocardiogram    a. Echo 8/16: Vigorous LVF, EF [id number], grade 1 diastolic dysfunction, trivial AI, mild MR, mild RVE, normal RV function, PASP 33 mmHg // b. Echo 1/17 EF 65-70%, normal wall motion, grade 1 diastolic dysfunction, trivial AI, MAC, mild MR  . History of stress test    Lexiscan Myoview 8/16:  EF 81%, breast attenuation, no ischemia; Low Risk  . Hyperlipidemia   . Hypertension   . Hypothyroidism   . Shortness of breath   . Thyroid disease    Past Surgical History:  Procedure Laterality Date  . ABDOMINAL HYSTERECTOMY  2008  . bladder tact    . BREAST LUMPECTOMY  2000  . BREAST LUMPECTOMY  2007  . CATARACT EXTRACTION  2011  . JOINT REPLACEMENT    . KNEE SURGERY     right  . ROTATOR CUFF REPAIR     right  . THYROID SURGERY  2002  . TOTAL KNEE ARTHROPLASTY Left 06/02/2013   Dr Rhona Raider  . TOTAL KNEE ARTHROPLASTY Right 06/02/2013   Procedure: RIGHT TOTAL KNEE ARTHROPLASTY;  Surgeon: Hessie Dibble, MD;  Location: [place];  Service: Orthopedics;  Laterality: Right;  . TOTAL KNEE ARTHROPLASTY Left 08/22/2014   Procedure: TOTAL KNEE ARTHROPLASTY;   Surgeon: Hessie Dibble, MD;  Location: [place];  Service: Orthopedics;  Laterality: Left;   Current Outpatient Prescriptions on File Prior to Visit  Medication Sig Dispense Refill  . amLODipine (NORVASC) 5 MG tablet Take 1 tablet (5 mg total) by mouth daily. 90 tablet 3  . aspirin 81 MG tablet Take 81 mg by mouth daily.    . benzonatate (TESSALON PERLES) 100 MG capsule Take 1 capsule (100 mg total) by mouth 3 (three) times daily as needed for cough. 30 capsule 0  . Calcium Carbonate-Vitamin D (CALCIUM + D) 600-200 MG-UNIT TABS Take 1 tablet by mouth 2 (two) times daily.      . cetirizine (ZYRTEC) 10 MG tablet Take 1 tablet (10 mg total) by mouth daily. 30 tablet 11  . clonazePAM (KLONOPIN) 1 MG tablet Take 1 tablet (1 mg total) by mouth 2 (two) times daily as needed for anxiety (stop lorazepam). 60 tablet 1  . COMBIVENT RESPIMAT 20-100 MCG/ACT AERS respimat INHALE 1 PUFF INTO THE LUGNS EVERY 6 HOURS AS NEEDED FOR WHEEZING 2 Inhaler 0  . Cranberry 250 MG CAPS Take 250 mg by mouth daily.    Marland Kitchen diltiazem (CARDIZEM) 30 MG tablet Take 1 tablet (30 mg total) by mouth daily as needed. Take as needed for  palpitations 90 tablet 1  . exemestane (AROMASIN) 25 MG tablet TAKE 1 TABLET EVERY DAY 90 tablet 2  . fluticasone (FLONASE) 50 MCG/ACT nasal spray Place 2 sprays into both nostrils daily. 16 g 6  . folic acid (FOLVITE) 1 MG tablet Take 1 mg by mouth daily.    . furosemide (LASIX) 20 MG tablet Take 1 tablet (20 mg total) by mouth daily. Can take 1 pill a day as needed for swelling 30 tablet 3  . levothyroxine (SYNTHROID) 125 MCG tablet Take 1 tablet (125 mcg total) by mouth daily. 90 tablet 1  . LORazepam (ATIVAN) 1 MG tablet TAKE 1 TABLET BY MOUTH IN THE MORNING & 1 TAB 30 MIN PRIOR TO BEDTIME 60 tablet 3  . losartan (COZAAR) 50 MG tablet Take 1 tablet (50 mg total) by mouth daily. 90 tablet 4  . Multiple Vitamins-Minerals (CENTRUM SILVER ADULT 50+ PO) Take 1 tablet by mouth daily.     . nebivolol  (BYSTOLIC) 10 MG tablet Take 10 mg by mouth daily.    . nitroGLYCERIN (NITROSTAT) 0.4 MG SL tablet Place 1 tablet (0.4 mg total) under the tongue every 5 (five) minutes as needed for chest pain. 25 tablet 3  . pantoprazole (PROTONIX) 40 MG tablet Take 1 tablet (40 mg total) by mouth as directed. 40 MG DAILY FOR 1 MONTH THEN AS NEEDED 90 tablet 3  . potassium chloride SA (K-DUR,KLOR-CON) 20 MEQ tablet Take 1 tablet (20 mEq total) by mouth daily. 90 tablet 1  . rosuvastatin (CRESTOR) 10 MG tablet Take 1 tablet (10 mg total) by mouth daily at 6 PM. 90 tablet 4  . vortioxetine HBr (TRINTELLIX) 10 MG TABS Take by mouth.     No current facility-administered medications on file prior to visit.    Allergies  Allergen Reactions  . Ambien [Zolpidem]     confusion  . Pyridium [Phenazopyridine Hcl] Other (See Comments)    hemolysis  . Sulfa Antibiotics Rash   Social History   Social History  . Marital status: Widowed    Spouse name: N/A  . Number of children: N/A  . Years of education: N/A   Occupational History  . Not on file.   Social History Main Topics  . Smoking status: Never Smoker  . Smokeless tobacco: Never Used     Comment: never used tobacco  . Alcohol use No  . Drug use: No  . Sexual activity: Not Currently   Other Topics Concern  . Not on file   Social History Narrative  . No narrative on file      Review of Systems  All other systems reviewed and are negative.      Objective:   Physical Exam  Cardiovascular: Normal rate, regular rhythm and normal heart sounds.   Pulmonary/Chest: Effort normal and breath sounds normal. No respiratory distress. She has no wheezes. She has no rales.  Vitals reviewed.  The skin covering the labia majora is erythematous and pink on both sides. There are linear excoriations due to scratching. The erythema extends up to the inguinal crease on both sides. There is no rash or lesions inside labia minora. There are no papules, vesicles,  or malignant lesions       Assessment & Plan:  Candidiasis  Rash of vulva  Rash appears to be an intertrigo type rash with profound skin irritation- Begin Elocon ointment QHS for 1 week and nystatin cream BID see if symptoms improve.

## 2016-09-04 ENCOUNTER — Encounter: Payer: Self-pay | Admitting: Physician Assistant

## 2016-09-04 ENCOUNTER — Ambulatory Visit (INDEPENDENT_AMBULATORY_CARE_PROVIDER_SITE_OTHER): Payer: Medicare Other | Admitting: Physician Assistant

## 2016-09-04 VITALS — BP 118/74 | HR 60 | Temp 98.3°F | Wt 157.0 lb

## 2016-09-04 DIAGNOSIS — H578 Other specified disorders of eye and adnexa: Secondary | ICD-10-CM

## 2016-09-04 DIAGNOSIS — H5789 Other specified disorders of eye and adnexa: Secondary | ICD-10-CM

## 2016-09-04 NOTE — Progress Notes (Signed)
Patient ID: Hannah Galvan MRN: [id number], DOB: December 04, 1930, 82 y.o. Date of Encounter: 09/04/2016, 2:58 PM    Chief Complaint:  Chief Complaint  Patient presents with  . eye irriation    x 1 week     HPI: 82 y.o. year old female  Presents with above.   Says that she has noticed right eye "feeling full" for past one week.  Has not noticed any change in her vision. Has not noticed any true pain but just this full feeling. No other complaints or concerns.     Home Meds:   Outpatient Medications Prior to Visit  Medication Sig Dispense Refill  . amLODipine (NORVASC) 5 MG tablet Take 1 tablet (5 mg total) by mouth daily. 90 tablet 3  . aspirin 81 MG tablet Take 81 mg by mouth daily.    . clonazePAM (KLONOPIN) 1 MG tablet Take 1 tablet (1 mg total) by mouth 2 (two) times daily as needed for anxiety (stop lorazepam). 60 tablet 1  . Cranberry 250 MG CAPS Take 250 mg by mouth daily.    . folic acid (FOLVITE) 1 MG tablet Take 1 mg by mouth daily.    . furosemide (LASIX) 20 MG tablet Take 1 tablet (20 mg total) by mouth daily. Can take 1 pill a day as needed for swelling 30 tablet 3  . levothyroxine (SYNTHROID) 125 MCG tablet Take 1 tablet (125 mcg total) by mouth daily. 90 tablet 1  . LORazepam (ATIVAN) 1 MG tablet TAKE 1 TABLET BY MOUTH IN THE MORNING & 1 TAB 30 MIN PRIOR TO BEDTIME 60 tablet 3  . losartan (COZAAR) 50 MG tablet Take 1 tablet (50 mg total) by mouth daily. 90 tablet 4  . Multiple Vitamins-Minerals (CENTRUM SILVER ADULT 50+ PO) Take 1 tablet by mouth daily.     . nebivolol (BYSTOLIC) 10 MG tablet Take 10 mg by mouth daily.    Marland Kitchen nystatin ointment (MYCOSTATIN) Apply 1 application topically 2 (two) times daily. 30 g 0  . potassium chloride SA (K-DUR,KLOR-CON) 20 MEQ tablet Take 1 tablet (20 mEq total) by mouth daily. 90 tablet 1  . rosuvastatin (CRESTOR) 10 MG tablet Take 1 tablet (10 mg total) by mouth daily at 6 PM. 90 tablet 4  . vortioxetine HBr (TRINTELLIX) 10 MG  TABS Take by mouth.    . benzonatate (TESSALON PERLES) 100 MG capsule Take 1 capsule (100 mg total) by mouth 3 (three) times daily as needed for cough. (Patient not taking: Reported on 09/04/2016) 30 capsule 0  . Calcium Carbonate-Vitamin D (CALCIUM + D) 600-200 MG-UNIT TABS Take 1 tablet by mouth 2 (two) times daily.      . cetirizine (ZYRTEC) 10 MG tablet Take 1 tablet (10 mg total) by mouth daily. (Patient not taking: Reported on 09/04/2016) 30 tablet 11  . COMBIVENT RESPIMAT 20-100 MCG/ACT AERS respimat INHALE 1 PUFF INTO THE LUGNS EVERY 6 HOURS AS NEEDED FOR WHEEZING (Patient not taking: Reported on 09/04/2016) 2 Inhaler 0  . diltiazem (CARDIZEM) 30 MG tablet Take 1 tablet (30 mg total) by mouth daily as needed. Take as needed for palpitations (Patient not taking: Reported on 09/04/2016) 90 tablet 1  . exemestane (AROMASIN) 25 MG tablet TAKE 1 TABLET EVERY DAY 90 tablet 2  . fluticasone (FLONASE) 50 MCG/ACT nasal spray Place 2 sprays into both nostrils daily. (Patient not taking: Reported on 09/04/2016) 16 g 6  . mometasone (ELOCON) 0.1 % ointment Apply topically daily. (Patient not taking: Reported  on 09/04/2016) 45 g 0  . nitroGLYCERIN (NITROSTAT) 0.4 MG SL tablet Place 1 tablet (0.4 mg total) under the tongue every 5 (five) minutes as needed for chest pain. (Patient not taking: Reported on 09/04/2016) 25 tablet 3  . pantoprazole (PROTONIX) 40 MG tablet Take 1 tablet (40 mg total) by mouth as directed. 40 MG DAILY FOR 1 MONTH THEN AS NEEDED (Patient not taking: Reported on 09/04/2016) 90 tablet 3   No facility-administered medications prior to visit.     Allergies:  Allergies  Allergen Reactions  . Ambien [Zolpidem]     confusion  . Pyridium [Phenazopyridine Hcl] Other (See Comments)    hemolysis  . Sulfa Antibiotics Rash      Review of Systems: See HPI for pertinent ROS. All other ROS negative.    Physical Exam: Blood pressure 118/74, pulse 60, temperature 98.3 F (36.8 C),  temperature source Oral, weight 157 lb (71.2 kg), SpO2 96 %., Body mass index is 28.26 kg/m. General:  WNWD WF. Appears in no acute distress. HEENT: Right Eye----The entire sclera is blood-red. The iris appears normal.  Neck: Supple. No thyromegaly. No lymphadenopathy. Lungs: Clear bilaterally to auscultation without wheezes, rales, or rhonchi. Breathing is unlabored. Heart: Regular rhythm. No murmurs, rubs, or gallops. Abdomen: Soft, non-tender, non-distended with normoactive bowel sounds. No hepatomegaly. No rebound/guarding. No obvious abdominal masses. Msk:  Strength and tone normal for age. Extremities/Skin: Warm and dry. No clubbing or cyanosis. No edema. No rashes or suspicious lesions. Neuro: Alert and oriented X 3. Moves all extremities spontaneously. Gait is normal. CNII-XII grossly in tact. Psych:  Responds to questions appropriately with a normal affect.     ASSESSMENT AND PLAN:  82 y.o. year old female with  1. Redness of eye, right She needs to be evaluated by an eye doctor. She says she does have an eye doctor, Dr. Greig Right Galvan has called that office. They state they can/will see pt now, to send her to their office now.  Pt and her 2 accompanying family members were instructed to go directly to that doctor's office. They voice understanding and agree.   Hannah Galvan, [place], [facility] 09/04/2016 2:58 PM

## 2016-09-12 ENCOUNTER — Telehealth: Payer: Self-pay | Admitting: Family Medicine

## 2016-09-12 NOTE — Telephone Encounter (Signed)
Pt called and states that the Trintellix is working good for her and would like a prescription call into her pharm. OK to call to pharm and if they don't cover you want her to have samples or change her to something ins will cover.

## 2016-09-12 NOTE — Telephone Encounter (Signed)
Patient aware via vm and samples x 3 weeks left up front.

## 2016-09-12 NOTE — Telephone Encounter (Signed)
Give her samples

## 2016-09-25 ENCOUNTER — Other Ambulatory Visit: Payer: Self-pay | Admitting: *Deleted

## 2016-09-25 MED ORDER — FOLIC ACID 1 MG PO TABS: 1.0000 mg | ORAL_TABLET | Freq: Every day | ORAL | 1 refills | Status: DC

## 2016-09-25 NOTE — Telephone Encounter (Signed)
Received call from patient.   Requested refill on Folic Acid.   Prescription sent to pharmacy.

## 2016-10-08 ENCOUNTER — Encounter: Payer: Self-pay | Admitting: Podiatry

## 2016-10-08 ENCOUNTER — Ambulatory Visit (INDEPENDENT_AMBULATORY_CARE_PROVIDER_SITE_OTHER): Payer: Medicare Other | Admitting: Podiatry

## 2016-10-08 VITALS — BP 155/82 | HR 74 | Resp 18

## 2016-10-08 DIAGNOSIS — L600 Ingrowing nail: Secondary | ICD-10-CM | POA: Diagnosis not present

## 2016-10-08 NOTE — Patient Instructions (Signed)
ANTIBACTERIAL SOAP, Epsom salt or dishwashing soak INSTRUCTIONS  THE DAY AFTER PROCEDURE  Please follow the instructions your doctor has marked.   Shower as usual. Before getting out, place a drop of antibacterial liquid soap (Dial) on a wet, clean washcloth.  Gently wipe washcloth over affected area.  Afterward, rinse the area with warm water.  Blot the area dry with a soft cloth and cover with antibiotic ointment (neosporin, polysporin, bacitracin) and band aid or gauze and tape  Place 3-4 drops of antibacterial liquid soap in a quart of warm tap water, or Epsom salts, or dishwashing soap.  Submerge foot into water for 2 minutes.  If bandage was applied after your procedure, leave on to allow for easy lift off, then remove and continue with soak for the remaining time.  Next, blot area dry with a soft cloth and cover with a bandage.  Apply other medications as directed by your doctor, such as cortisporin otic solution (eardrops) or neosporin antibiotic ointment

## 2016-10-08 NOTE — Progress Notes (Signed)
   Subjective:    Patient ID: Hannah Galvan, female    DOB: [birth year], 82 y.o.   MRN: [id number]  HPI     This patient presents for office again complaining of extremely painful hallux toenails and a second right toenail. She states that because of the extreme pain she has been the toenails she not able to wear closed shoes comfortably for approximately one year The right hallux nail is the most symptomatic of these 3 nails. Patient states that she has attempted to trim these nails with only slight temporary reduction of pain. She says that the debridement of the toenails by Dr. Paulla Dolly on 05/21/2016 resulted in a temporary  relief of symptoms. Patient said that she would like to have a painful nails treated surgically at this time. Patient denies any history of diabetes and is a nonsmoker The patient's son is present in the treatment room today  Review of Systems  All other systems reviewed and are negative.      Objective:   Physical Exam  Orientated 3  Vascular: No calf edema or calf tenderness bilaterally DP and PT pulses 2/4 bilaterally Capillary reflex immediate bilaterally  Neurological: Sensation to 10 g monofilament wire intact 5/5 bilaterally Vibratory sensation reactive bilaterally Ankle reflex equal and reactive bilaterally  Dermatological: No open skin lesions bilaterally The right hallux nail is mild occasional dystrophic changes with incurvation on the medial lateral borders. The right hallux nails tender to medial lateral palpation which duplicates area of discomfort The left hallux toenail has similar appearance with exception is only mildly incurvated and slightly tender to palpation. There is no surrounding erythema, edema, warmth, drainage around the hallux toenails. The second right toenail is mildly hypertrophic and slightly tender to direct palpation without any surrounding erythema, edema or drainage  Musculoskeletal: Manual motor testing: Dorsi flexion,  plantar flexion, inversion, eversion 5/5 bilaterally There is no restriction or crepitus or pain upon range of motion of ankle, subtalar, midtarsal joints bilaterally      Assessment & Plan:   Assessment: Satisfactory neurovascular status Ingrowing borders of the hallux toenails bilaterally Deformed ingrowing second right toenail  Plan: I did review treatment options with patient and patient's son present in the treatment room we discussed repetitive debridement versus surgical treatment of the offending borders of the hallux toenails and possible total removal of the second right toe nail. Patient did requests permanent nail removal. I recommended that we do 1 nail per operative session and treat the most symptomatic nail initially. Patient opts for treatment of the right hallux nail which would include permanent removal of medial lateral borders of these nail under local anesthesia. I explained the procedure to patient at patient's son in detail and and the patient and patient some verbally consents  The right hallux was blocked with 3 mL of 50-50 mixture of 2% plain Xylocaine and 0.5% plain Sensorcaine. The right hallux was painted with Betadine and exsanguinated. The medial and lateral borders of the right hallux nail were excised and a phenol matricectomy performed. The wound sites were flushed with alcohol and an antibiotic compression was applied. The tourniquet was released and spontaneous capillary fill time noted in the right hallux. Postoperative oral and written instructions provided. Patient tolerated procedure without any difficulty  Reappoint 7 days for evaluation of the right hallux phenol matricectomy and possible phenol matricectomy to the left hallux nail.

## 2016-10-20 ENCOUNTER — Other Ambulatory Visit: Payer: Self-pay | Admitting: Family Medicine

## 2016-10-20 NOTE — Telephone Encounter (Signed)
Ok to refill 

## 2016-10-20 NOTE — Telephone Encounter (Signed)
ok 

## 2016-10-21 ENCOUNTER — Ambulatory Visit (INDEPENDENT_AMBULATORY_CARE_PROVIDER_SITE_OTHER): Payer: Medicare Other | Admitting: Podiatry

## 2016-10-21 ENCOUNTER — Encounter: Payer: Self-pay | Admitting: Podiatry

## 2016-10-21 VITALS — BP 138/72 | HR 71 | Resp 14

## 2016-10-21 DIAGNOSIS — L03031 Cellulitis of right toe: Secondary | ICD-10-CM

## 2016-10-21 MED ORDER — CEPHALEXIN 500 MG PO CAPS: 500.0000 mg | ORAL_CAPSULE | Freq: Two times a day (BID) | ORAL | 0 refills | Status: DC

## 2016-10-21 NOTE — Progress Notes (Signed)
Patient ID: Hannah Galvan, female   DOB: Apr 09, 1931, 82 y.o.   MRN: [id number]   Subjective: This patient presents for follow-up care for phenol matricectomy of the right hallux on the visit of 10/08/2016. At this time patient states that she is not having any significant problems  Objective:   Orientated 3  Vascular: No calf edema or calf tenderness bilaterally DP and PT pulses 2/4 bilaterally Capillary reflex immediate bilaterally  Neurological: Sensation to 10 g monofilament wire intact 5/5 bilaterally Vibratory sensation reactive bilaterally Ankle reflex equal and reactive bilaterally   Dermatological: The margins of the right hallux toenail are narrowed. This is a low-grade erythema that extends to the interphalangeal joint. Vesicular eruptions within the inflammatory erythematous area on the right hallux. There is no evidence of extending cellulitis there is no warmth, malodor or active drainage  Assessment: Paronychia right hallux Possible contact dermatitis to topical antibiotic ointment  Plan: DC topical antibiotic ointment and substitute Vaseline Rx cephalexin 500 mg by mouth twice a day 7 days  Reappoint 7 days

## 2016-10-21 NOTE — Patient Instructions (Signed)
Stop using the topical antibiotic ointment on the right big toenail. Substitute Vaseline and cover with a Band-Aid Begin taking oral antibiotics one twice a day 7 days

## 2016-10-28 ENCOUNTER — Ambulatory Visit (INDEPENDENT_AMBULATORY_CARE_PROVIDER_SITE_OTHER): Payer: Medicare Other | Admitting: Podiatry

## 2016-10-28 ENCOUNTER — Encounter: Payer: Self-pay | Admitting: Podiatry

## 2016-10-28 VITALS — BP 138/69 | HR 81 | Resp 14

## 2016-10-28 DIAGNOSIS — L600 Ingrowing nail: Secondary | ICD-10-CM

## 2016-10-28 NOTE — Patient Instructions (Signed)
Continue to apply Vaseline and a Band-Aid to the right great toenail until the dry firm scab forms Complete any remaining antibiotics that you have Return if the symptoms worsen over time We'll defer on nail surgery left great toe until the right great toenail area is totally pain-free

## 2016-10-28 NOTE — Progress Notes (Signed)
Patient ID: Hannah Galvan, female   DOB: May 17, 1931, 82 y.o.   MRN: [id number]   Subjective: Patient presents for follow-up care for phenol matricectomy from 10/08/2016. On the visit of 10/21/2016 a low-grade paronychia and contact dermatitis was diagnosed. Cephalexin 500 mg twice a day 7 days prescribed. Patient currently has 1 or 2 doses left and denies any complaints from the antibiotics. She says she still has some sensitivity in or around the margin the right hallux toenail  Objective: Orientated 3 DP and PT pulses 2/4 bilaterally Capillary reflex immediate bilaterally Lateral border right hallux toenail is narrowed with eschar in the nail groove. There is a low-grade edema surrounding the lateral nail groove. There is no active drainage. There is no ascending cellulitis from the nail margin The this vesicular lesions on the posterior nail fold have resolved  Assessment: Resolving paronychia right hallux Contact dermatitis to topical antibiotic ointment  Plan: Complete all remaining cephalexin. Continue to apply topical Vaseline and a Band-Aid until all sensitivity ends Contact office if the symptoms worsen Defer on matricectomy on left hallux until right hallux nail was asymptomatic  Reappoint at patient's request

## 2016-10-30 ENCOUNTER — Ambulatory Visit (INDEPENDENT_AMBULATORY_CARE_PROVIDER_SITE_OTHER): Payer: Medicare Other | Admitting: Family Medicine

## 2016-10-30 VITALS — BP 144/70 | HR 92 | Temp 98.4°F | Resp 14 | Wt 154.0 lb

## 2016-10-30 DIAGNOSIS — R011 Cardiac murmur, unspecified: Secondary | ICD-10-CM

## 2016-10-30 DIAGNOSIS — I499 Cardiac arrhythmia, unspecified: Secondary | ICD-10-CM

## 2016-10-30 DIAGNOSIS — R0600 Dyspnea, unspecified: Secondary | ICD-10-CM | POA: Diagnosis not present

## 2016-10-30 DIAGNOSIS — I444 Left anterior fascicular block: Secondary | ICD-10-CM

## 2016-10-30 NOTE — Progress Notes (Signed)
Subjective:    Patient ID: Hannah Galvan, female    DOB: August 25, 1931, 82 y.o.   MRN: [id number]  HPI Patient presents with several complaints. She reports shortness of breath with exertion. She reports tachycardia. She reports headaches on a daily basis. She reports worsening depression. She is constantly thinking about her husband and her son who both passed away. She has a difficult time sleeping. She feels poorly. She has discontinued many of her medications including Cardizem, bystolic, lasix although she does not recall why or when.  She is still taking Trintellix and klonopin.    Her primary concern is her shortness of breath and palpitations in her heart.  I performed a quick cursory exam in which point I discover that her murmur is much lower than previous exams. Today I would grade 3-4/6.  This is lower than previously reported. Therefore I'll perform an EKG to determine if something is structurally changed. This is compared to an EKG from January 2017. In January 2017 QRS interval was 76 ms and today it has increased over 130. She's developed a new left anterior fascicular block which was not present previously.   Past Medical History:  Diagnosis Date  . Allergy   . Anxiety   . Arthritis   . Blood transfusion without reported diagnosis   . Cancer ([place])    breast, s/p RXT, bilateral  . Cataract   . History of echocardiogram    a. Echo 8/16: Vigorous LVF, EF Q000111Q, grade 1 diastolic dysfunction, trivial AI, mild MR, mild RVE, normal RV function, PASP 33 mmHg // b. Echo 1/17 EF 65-70%, normal wall motion, grade 1 diastolic dysfunction, trivial AI, MAC, mild MR  . History of stress test    Lexiscan Myoview 8/16:  EF 81%, breast attenuation, no ischemia; Low Risk  . Hyperlipidemia   . Hypertension   . Hypothyroidism   . Shortness of breath   . Thyroid disease    Past Surgical History:  Procedure Laterality Date  . ABDOMINAL HYSTERECTOMY  2008  . bladder tact    . BREAST  LUMPECTOMY  2000  . BREAST LUMPECTOMY  2007  . CATARACT EXTRACTION  2011  . JOINT REPLACEMENT    . KNEE SURGERY     right  . ROTATOR CUFF REPAIR     right  . THYROID SURGERY  2002  . TOTAL KNEE ARTHROPLASTY Left 06/02/2013   Dr Rhona Raider  . TOTAL KNEE ARTHROPLASTY Right 06/02/2013   Procedure: RIGHT TOTAL KNEE ARTHROPLASTY;  Surgeon: Hessie Dibble, MD;  Location: [facility];  Service: Orthopedics;  Laterality: Right;  . TOTAL KNEE ARTHROPLASTY Left 08/22/2014   Procedure: TOTAL KNEE ARTHROPLASTY;  Surgeon: Hessie Dibble, MD;  Location: [facility];  Service: Orthopedics;  Laterality: Left;   Current Outpatient Prescriptions on File Prior to Visit  Medication Sig Dispense Refill  . aspirin 81 MG tablet Take 81 mg by mouth daily.    . Calcium Carbonate-Vitamin D (CALCIUM + D) 600-200 MG-UNIT TABS Take 1 tablet by mouth 2 (two) times daily.      . clonazePAM (KLONOPIN) 1 MG tablet TAKE 1 TABLET BY MOUTH TWICE A DAY AS NEEDED 60 tablet 2  . Cranberry 250 MG CAPS Take 250 mg by mouth daily.    Marland Kitchen exemestane (AROMASIN) 25 MG tablet TAKE 1 TABLET EVERY DAY 90 tablet 2  . folic acid (FOLVITE) 1 MG tablet Take 1 tablet (1 mg total) by mouth daily. 90 tablet 1  . levothyroxine (  SYNTHROID) 125 MCG tablet Take 1 tablet (125 mcg total) by mouth daily. 90 tablet 1  . losartan (COZAAR) 50 MG tablet Take 1 tablet (50 mg total) by mouth daily. 90 tablet 4  . Multiple Vitamins-Minerals (CENTRUM SILVER ADULT 50+ PO) Take 1 tablet by mouth daily.     . nebivolol (BYSTOLIC) 10 MG tablet Take 10 mg by mouth daily.    . nitroGLYCERIN (NITROSTAT) 0.4 MG SL tablet Place 1 tablet (0.4 mg total) under the tongue every 5 (five) minutes as needed for chest pain. 25 tablet 3  . potassium chloride SA (K-DUR,KLOR-CON) 20 MEQ tablet Take 1 tablet (20 mEq total) by mouth daily. 90 tablet 1  . rosuvastatin (CRESTOR) 10 MG tablet Take 1 tablet (10 mg total) by mouth daily at 6 PM. 90 tablet 4  . vortioxetine HBr (TRINTELLIX) 10  MG TABS Take by mouth.    Marland Kitchen amLODipine (NORVASC) 5 MG tablet Take 1 tablet (5 mg total) by mouth daily. (Patient not taking: Reported on 10/30/2016) 90 tablet 3  . COMBIVENT RESPIMAT 20-100 MCG/ACT AERS respimat INHALE 1 PUFF INTO THE LUGNS EVERY 6 HOURS AS NEEDED FOR WHEEZING (Patient not taking: Reported on 10/30/2016) 2 Inhaler 0  . diltiazem (CARDIZEM) 30 MG tablet Take 1 tablet (30 mg total) by mouth daily as needed. Take as needed for palpitations (Patient not taking: Reported on 10/30/2016) 90 tablet 1  . fluticasone (FLONASE) 50 MCG/ACT nasal spray Place 2 sprays into both nostrils daily. (Patient not taking: Reported on 10/30/2016) 16 g 6  . furosemide (LASIX) 20 MG tablet Take 1 tablet (20 mg total) by mouth daily. Can take 1 pill a day as needed for swelling (Patient not taking: Reported on 10/30/2016) 30 tablet 3  . LORazepam (ATIVAN) 1 MG tablet TAKE 1 TABLET BY MOUTH IN THE MORNING & 1 TAB 30 MIN PRIOR TO BEDTIME (Patient not taking: Reported on 10/30/2016) 60 tablet 3  . nystatin ointment (MYCOSTATIN) Apply 1 application topically 2 (two) times daily. (Patient not taking: Reported on 10/30/2016) 30 g 0  . pantoprazole (PROTONIX) 40 MG tablet Take 1 tablet (40 mg total) by mouth as directed. 40 MG DAILY FOR 1 MONTH THEN AS NEEDED (Patient not taking: Reported on 10/30/2016) 90 tablet 3   No current facility-administered medications on file prior to visit.    Allergies  Allergen Reactions  . Ambien [Zolpidem]     confusion  . Pyridium [Phenazopyridine Hcl] Other (See Comments)    hemolysis  . Sulfa Antibiotics Rash   Social History   Social History  . Marital status: Widowed    Spouse name: N/A  . Number of children: N/A  . Years of education: N/A   Occupational History  . Not on file.   Social History Main Topics  . Smoking status: Never Smoker  . Smokeless tobacco: Never Used     Comment: never used tobacco  . Alcohol use No  . Drug use: No  . Sexual activity: Not  Currently   Other Topics Concern  . Not on file   Social History Narrative  . No narrative on file      Review of Systems  All other systems reviewed and are negative.      Objective:   Physical Exam  Constitutional: She appears well-developed and well-nourished.  Neck: Neck supple. No JVD present.  Cardiovascular: Normal rate and regular rhythm.   Murmur heard.  Systolic murmur is present with a grade of 3/6  Pulmonary/Chest:  Effort normal and breath sounds normal. No respiratory distress. She has no wheezes. She has no rales.  Abdominal: Soft. Bowel sounds are normal. She exhibits no distension. There is no tenderness. There is no rebound and no guarding.  Musculoskeletal: She exhibits no edema.          Assessment & Plan:  Patient has a new left anterior fascicular block compared to her EKG from January of this year. Her murmur is louder. All this is concerning particularly when she reports more shortness of breath, more dyspnea on exertion, and more palpitations.  While majority of her symptoms sound like depression and anxiety, I cannot account for these objective findings on her exam. Therefore I'm going to expedite a cardiology consultation is a believe she warrants a workup

## 2016-11-03 ENCOUNTER — Encounter: Payer: Self-pay | Admitting: Physician Assistant

## 2016-11-04 ENCOUNTER — Ambulatory Visit (INDEPENDENT_AMBULATORY_CARE_PROVIDER_SITE_OTHER): Payer: Medicare Other | Admitting: Physician Assistant

## 2016-11-04 ENCOUNTER — Encounter: Payer: Self-pay | Admitting: Physician Assistant

## 2016-11-04 VITALS — BP 140/68 | HR 76 | Ht 62.5 in | Wt 153.8 lb

## 2016-11-04 DIAGNOSIS — I10 Essential (primary) hypertension: Secondary | ICD-10-CM

## 2016-11-04 DIAGNOSIS — F419 Anxiety disorder, unspecified: Secondary | ICD-10-CM

## 2016-11-04 DIAGNOSIS — R0602 Shortness of breath: Secondary | ICD-10-CM

## 2016-11-04 DIAGNOSIS — I447 Left bundle-branch block, unspecified: Secondary | ICD-10-CM

## 2016-11-04 DIAGNOSIS — R011 Cardiac murmur, unspecified: Secondary | ICD-10-CM | POA: Diagnosis not present

## 2016-11-04 DIAGNOSIS — R0789 Other chest pain: Secondary | ICD-10-CM | POA: Diagnosis not present

## 2016-11-04 LAB — CBC
HEMATOCRIT: 42.7 % (ref 35.0–45.0)
HEMOGLOBIN: 14.2 g/dL (ref 11.7–15.5)
MCH: 31.4 pg (ref 27.0–33.0)
MCHC: 33.3 g/dL (ref 32.0–36.0)
MCV: 94.5 fL (ref 80.0–100.0)
MPV: 11.6 fL (ref 7.5–12.5)
Platelets: 202 10*3/uL (ref 140–400)
RBC: 4.52 MIL/uL (ref 3.80–5.10)
RDW: 13.3 % (ref 11.0–15.0)
WBC: 7.2 10*3/uL (ref 3.8–10.8)

## 2016-11-04 NOTE — Progress Notes (Signed)
Cardiology Office Note:    Date:  11/04/2016   ID:  Hannah Galvan, DOB 06-Aug-1931, MRN [id number]  PCP:  Hannah Fraction, MD  Cardiologist:  Dr. Darlin [facility], PA-C) >> patient requests Dr. Quay Burow  Electrophysiologist:  n/a  Referring MD: Hannah Frizzle, MD   Chief Complaint  Patient presents with  . Abnormal ECG  . Heart Murmur    History of Present Illness:    Hannah Galvan is a 82 y.o. female with a hx of HTN, HL, hypothyroidism, palpitations, breast CA (R in 2000, L in 2007), HL (intol to Crestor), mild AI, mild MR. In 8/16, an event monitor was done for palpitations.  Initially, her monitor was read out as showing one episode of possible paroxysmal AF.  She was placed on Apixaban.  However, after monitor was complete, further review demonstrated that what was thought to be AFib was actually artifact and Apixaban was DC'd.  She was admitted in 12/16 for chest pain and noted to have a LBBB.  A follow up echocardiogram demonstrated normal ejection Galvan.  Last seen here by me in 3/17.  Since Dr. Mare Ferrari had retired, she requested that her primary cardiologist be Dr. Quay Burow at that time.    She was recently seen by primary care with complaints of chest pain, shortness of breath, palpitations.  She was noted to have a murmur on exam.  ECG was read out as LAFB. However, upon my review, this demonstrates a LBBB.  As noted above, this appears to be transient for her.  An echocardiogram earlier this year demonstrated normal LVF.    She is here alone today. She notes significant symptoms of anxiety. She has occasional episodes of discomfort in her chest that she relates to anxiety. She denies chest discomfort with activity. She does note dyspnea with exertion. She thinks that this may be getting worse. She denies orthopnea or PND. She has mild pedal edema without change. She has chronic palpitations without significant change. She denies syncope or near  syncope. She denies pleuritic chest pain.  Prior CV studies that were reviewed today include:    Echo 12/11/15 Vigorous LVF, EF 65-70%, normal wall motion, grade 1 diastolic dysfunction, trivial AI, MAC, mild MR  Event monitor 8/16 NSR with frequent PACs No atrial fibrillation  Myoview 8/16 EF 81%, apical defect most likely consistent with breast attenuation, no ischemia, low risk  Echo 07/09/15   Vigorous LVF, EF Q000111Q, grade 1 diastolic dysfunction, trivial AI, MAC, mild MR, mild RVE, normal RV function, PASP 33 mmHg  Holter 11/2014 NSR, PACs  Echo 06/2012 EF 60-65%, normal wall motion, grade 1 diastolic dysfunction, trivial AI, mild MR, mild SAM, PASP 36 mmHg  Event Monitor 07/2012 NSR, PACs, PVCs  Carotid US 10/12 No significant carotid bifurcation plaque or stenosis.  Past Medical History:  Diagnosis Date  . Allergy   . Anxiety   . Arthritis   . Blood transfusion without reported diagnosis   . Cancer ([place])    breast, s/p RXT, bilateral  . Cataract   . History of echocardiogram    a. Echo 8/16: Vigorous LVF, EF Q000111Q, grade 1 diastolic dysfunction, trivial AI, mild MR, mild RVE, normal RV function, PASP 33 mmHg // b. Echo 1/17 EF 65-70%, normal wall motion, grade 1 diastolic dysfunction, trivial AI, MAC, mild MR  . History of stress test    Lexiscan Myoview 8/16:  EF 81%, breast attenuation, no ischemia; Low Risk  . Hyperlipidemia   .  Hypertension   . Hypothyroidism   . Shortness of breath   . Thyroid disease     Past Surgical History:  Procedure Laterality Date  . ABDOMINAL HYSTERECTOMY  2008  . bladder tact    . BREAST LUMPECTOMY  2000  . BREAST LUMPECTOMY  2007  . CATARACT EXTRACTION  2011  . JOINT REPLACEMENT    . KNEE SURGERY     right  . ROTATOR CUFF REPAIR     right  . THYROID SURGERY  2002  . TOTAL KNEE ARTHROPLASTY Left 06/02/2013   Dr Rhona Raider  . TOTAL KNEE ARTHROPLASTY Right 06/02/2013   Procedure: RIGHT TOTAL KNEE ARTHROPLASTY;   Surgeon: Hessie Dibble, MD;  Location: [facility];  Service: Orthopedics;  Laterality: Right;  . TOTAL KNEE ARTHROPLASTY Left 08/22/2014   Procedure: TOTAL KNEE ARTHROPLASTY;  Surgeon: Hessie Dibble, MD;  Location: [facility];  Service: Orthopedics;  Laterality: Left;    Current Medications: Current Meds  Medication Sig  . amLODipine (NORVASC) 5 MG tablet Take 1 tablet (5 mg total) by mouth daily.  Marland Kitchen aspirin 81 MG tablet Take 81 mg by mouth daily.  . Calcium Carbonate-Vitamin D (CALCIUM + D) 600-200 MG-UNIT TABS Take 1 tablet by mouth daily.   . clonazePAM (KLONOPIN) 1 MG tablet TAKE 1 TABLET BY MOUTH TWICE A DAY AS NEEDED  . COMBIVENT RESPIMAT 20-100 MCG/ACT AERS respimat INHALE 1 PUFF INTO THE LUGNS EVERY 6 HOURS AS NEEDED FOR WHEEZING  . Cranberry 250 MG CAPS Take 250 mg by mouth daily.  Marland Kitchen exemestane (AROMASIN) 25 MG tablet TAKE 1 TABLET EVERY DAY  . folic acid (FOLVITE) 1 MG tablet Take 1 tablet (1 mg total) by mouth daily.  . furosemide (LASIX) 20 MG tablet Take 1 tablet (20 mg total) by mouth daily. Can take 1 pill a day as needed for swelling  . levothyroxine (SYNTHROID) 125 MCG tablet Take 1 tablet (125 mcg total) by mouth daily.  Marland Kitchen LORazepam (ATIVAN) 1 MG tablet TAKE 1 TABLET BY MOUTH IN THE MORNING & 1 TAB 30 MIN PRIOR TO BEDTIME  . losartan (COZAAR) 50 MG tablet Take 1 tablet (50 mg total) by mouth daily.  . Multiple Vitamins-Minerals (CENTRUM SILVER ADULT 50+ PO) Take 1 tablet by mouth daily.   . nebivolol (BYSTOLIC) 10 MG tablet Take 10 mg by mouth daily.  . nitroGLYCERIN (NITROSTAT) 0.4 MG SL tablet Place 1 tablet (0.4 mg total) under the tongue every 5 (five) minutes as needed for chest pain.  Marland Kitchen nystatin ointment (MYCOSTATIN) Apply 1 application topically daily.  . pantoprazole (PROTONIX) 40 MG tablet Take 1 tablet (40 mg total) by mouth as directed. 40 MG DAILY FOR 1 MONTH THEN AS NEEDED  . potassium chloride SA (K-DUR,KLOR-CON) 20 MEQ tablet Take 1 tablet (20 mEq total) by mouth  daily.  . rosuvastatin (CRESTOR) 10 MG tablet Take 1 tablet (10 mg total) by mouth daily at 6 PM.  . vortioxetine HBr (TRINTELLIX) 10 MG TABS Take 10 mg by mouth daily.      Allergies:   Ambien [zolpidem]; Pyridium [phenazopyridine hcl]; and Sulfa antibiotics   Social History   Social History  . Marital status: Widowed    Spouse name: N/A  . Number of children: N/A  . Years of education: N/A   Social History Main Topics  . Smoking status: Never Smoker  . Smokeless tobacco: Never Used     Comment: never used tobacco  . Alcohol use No  . Drug use:  No  . Sexual activity: Not Currently   Other Topics Concern  . None   Social History Narrative  . None     Family History:  The patient's family history includes Cancer in her brother, brother, brother, brother, brother, father, sister, sister, and sister; Heart disease in her sister; Hypertension in her mother; Stroke in her mother.   ROS:   Please see the history of present illness.    Review of Systems  Cardiovascular: Positive for dyspnea on exertion.  Musculoskeletal: Positive for back pain.  Psychiatric/Behavioral: Positive for depression. The patient is nervous/anxious.    All other systems reviewed and are negative.   EKGs/Labs/Other Test Reviewed:    EKG:  EKG is  ordered today.  The ekg ordered today demonstrates NSR, HR 76, PACs, LBBB  Recent Labs: 04/03/2016: TSH 0.92 06/16/2016: ALT 30; BUN 16.0; Creatinine 0.9; HGB 14.6; Platelets 203; Potassium 4.3; Sodium 138   Recent Lipid Panel    Component Value Date/Time   CHOL 146 12/10/2015 0934   TRIG 105 12/10/2015 0934   HDL 52 12/10/2015 0934   CHOLHDL 2.8 12/10/2015 0934   VLDL 21 12/10/2015 0934   LDLCALC 73 12/10/2015 0934     Physical Exam:    VS:  BP 140/68   Pulse 76   Ht 5' 2.5" (1.588 m)   Wt 153 lb 12.8 oz (69.8 kg)   BMI 27.68 kg/m     Wt Readings from Last 3 Encounters:  11/04/16 153 lb 12.8 oz (69.8 kg)  10/30/16 154 lb (69.9 kg)    09/04/16 157 lb (71.2 kg)     Physical Exam  Constitutional: She is oriented to person, place, and time. She appears well-developed and well-nourished. No distress.  HENT:  Head: Normocephalic and atraumatic.  Eyes: No scleral icterus.  Neck: No JVD present.  Cardiovascular: Normal rate, regular rhythm, S1 normal and S2 normal.   Murmur heard.  Medium-pitched holosystolic murmur is present with a grade of 3/6  at the lower left sternal border radiating to the neck, back Pulmonary/Chest: Effort normal. She has no wheezes. She has no rales.  Abdominal: Soft. There is no tenderness.  Musculoskeletal: She exhibits edema.  Trace bilateral ankle edema  Neurological: She is alert and oriented to person, place, and time.  Skin: Skin is warm and dry.  Psychiatric: She has a normal mood and affect.    ASSESSMENT:    1. Murmur   2. Other chest pain   3. Shortness of breath   4. Essential hypertension, benign   5. LBBB (left bundle branch block)   6. Anxiety    PLAN:    In order of problems listed above:  1. Murmur - She seems to have much louder systolic murmur. This sounds like mitral regurgitation. Echocardiogram in January demonstrated just mild mitral regurgitation. Previous echoes have demonstrated systolic anterior motion of the mitral valve.  However, there has not been significant LVH or LVOT gradient. She is not volume overloaded on exam.  -  Obtain BMET, CBC  -  Obtain echocardiogram   2. Chest pain  - She has some atypical chest symptoms. I suspect all of her symptoms are related to anxiety. However, she does have previous chest CTs that demonstrated coronary calcification. She has not had an ischemic workup in over a year. ECG does demonstrate left bundle branch block. However, this has been noted in the past and seems to be transient.   -  Obtain Lexiscan Myoview to rule  out ischemia   3. Shortness of breath  - This seems to be more related to anxiety than anything else.  She does have loud systolic murmur. However, she does not have any signs or symptoms of volume excess or pulmonary edema on exam. She does have a left bundle branch block. As noted above, this seems to have been transient over the past year.   -  BMET, CBC today   -  Obtain echocardiogram   -  Obtain Myoview    4. HTN - Fair control.  5. LBBB - As noted, this has been transient.  It was first noted during an admission in 12/16.  FU echo demonstrated normal LVEF and no wall motion abnormalities.  Proceed with workup as above.    6. Anxiety - She may benefit from adjustments in her anxiolytics.  She lives alone.  She still mourns her husband and son who passes away a few years ago.  She has other children that do not visit her and she tells me that she is lonely and scared to be by herself.     Medication Adjustments/Labs and Tests Ordered: Current medicines are reviewed at length with the patient today.  Concerns regarding medicines are outlined above.  Medication changes, Labs and Tests ordered today are outlined in the Patient Instructions noted below. Patient Instructions  Medication Instructions:  No changes.  See your medication list.    Labwork: Today - BMET, CBC  Testing/Procedures: 1. Schedule an echocardiogram (Diagnosis: shortness of breath, murmur).  2. Schedule a Lexiscan Myoview (nuclear stress test - Diagnosis: shortness of breath, chest pain)  Follow-Up: Richardson Dopp, PA-C or Dr. Quay Burow in 2-3 weeks.  Any Other Special Instructions Will Be Listed Below (If Applicable). Discuss with PICKARD,WARREN TOM, MD whether or not you can take more Clonazepam during the day to help with your anxiety.  Your EKG shows a Left Bundle Branch Block (LBBB). This has been seen on your EKG in the past and it seems to be transient (comes and goes).  When I saw you earlier this year, your EKG was normal.  But, in the hospital in December 2016, it was present.  It was present again  when you saw Dr. Dennard Schaumann and again today. We did an echocardiogram in January of this year to evaluate this and your heart function was normal at that time. The electricity is slowed in the left sided pathway (left bundle) in the bottom part of your heart.  The electrical signal is delayed across your ventricles (bottom chambers) because of this causing the EKG to look the way it does when you have a LBBB. So far, since it has been transient, it does not seem to point to any new problems with your heart. However, your murmur is louder.  Therefore, I will have you get an echocardiogram and stress test to further evaluate for valvular issues and to rule out a blockage.  If you need a refill on your cardiac medications before your next appointment, please call your pharmacy.   Signed, Richardson Dopp, PA-C  11/04/2016 4:40 PM    [facility] [place], [place], [place]  [address] Phone: [phone]; Fax: [phone]

## 2016-11-04 NOTE — Patient Instructions (Addendum)
Medication Instructions:  No changes.  See your medication list.    Labwork: Today - BMET, CBC  Testing/Procedures: 1. Schedule an echocardiogram (Diagnosis: shortness of breath, murmur).  2. Schedule a Lexiscan Myoview (nuclear stress test - Diagnosis: shortness of breath, chest pain)  Follow-Up: Richardson Dopp, PA-C or Dr. Quay Burow in 2-3 weeks.  Any Other Special Instructions Will Be Listed Below (If Applicable). Discuss with PICKARD,WARREN TOM, MD whether or not you can take more Clonazepam during the day to help with your anxiety.  Your EKG shows a Left Bundle Branch Block (LBBB). This has been seen on your EKG in the past and it seems to be transient (comes and goes).  When I saw you earlier this year, your EKG was normal.  But, in the hospital in December 2016, it was present.  It was present again when you saw Dr. Dennard Schaumann and again today. We did an echocardiogram in January of this year to evaluate this and your heart function was normal at that time. The electricity is slowed in the left sided pathway (left bundle) in the bottom part of your heart.  The electrical signal is delayed across your ventricles (bottom chambers) because of this causing the EKG to look the way it does when you have a LBBB. So far, since it has been transient, it does not seem to point to any new problems with your heart. However, your murmur is louder.  Therefore, I will have you get an echocardiogram and stress test to further evaluate for valvular issues and to rule out a blockage.  If you need a refill on your cardiac medications before your next appointment, please call your pharmacy.

## 2016-11-05 LAB — BASIC METABOLIC PANEL
BUN: 16 mg/dL (ref 7–25)
CO2: 25 mmol/L (ref 20–31)
Calcium: 9.4 mg/dL (ref 8.6–10.4)
Chloride: 105 mmol/L (ref 98–110)
Creat: 0.98 mg/dL — ABNORMAL HIGH (ref 0.60–0.88)
GLUCOSE: 103 mg/dL — AB (ref 65–99)
POTASSIUM: 4.3 mmol/L (ref 3.5–5.3)
SODIUM: 138 mmol/L (ref 135–146)

## 2016-11-09 ENCOUNTER — Other Ambulatory Visit: Payer: Self-pay | Admitting: Family Medicine

## 2016-11-20 ENCOUNTER — Telehealth (HOSPITAL_COMMUNITY): Payer: Self-pay | Admitting: *Deleted

## 2016-11-20 NOTE — Telephone Encounter (Signed)
Patient given detailed instructions per Myocardial Perfusion Study Information Sheet for the test on 11/26/16 at 10:00. Patient notified to arrive 15 minutes early and that it is imperative to arrive on time for appointment to keep from having the test rescheduled.  If you need to cancel or reschedule your appointment, please call the office within 24 hours of your appointment. Failure to do so may result in a cancellation of your appointment, and a $50 no show fee. Patient verbalized understanding.Hannah Galvan

## 2016-11-26 ENCOUNTER — Encounter: Payer: Self-pay | Admitting: Physician Assistant

## 2016-11-26 ENCOUNTER — Telehealth: Payer: Self-pay | Admitting: *Deleted

## 2016-11-26 ENCOUNTER — Ambulatory Visit (HOSPITAL_COMMUNITY): Payer: Medicare Other | Attending: Cardiovascular Disease

## 2016-11-26 ENCOUNTER — Other Ambulatory Visit: Payer: Self-pay

## 2016-11-26 ENCOUNTER — Ambulatory Visit (HOSPITAL_BASED_OUTPATIENT_CLINIC_OR_DEPARTMENT_OTHER): Payer: Medicare Other

## 2016-11-26 VITALS — Ht 63.0 in | Wt 153.0 lb

## 2016-11-26 DIAGNOSIS — I447 Left bundle-branch block, unspecified: Secondary | ICD-10-CM | POA: Insufficient documentation

## 2016-11-26 DIAGNOSIS — R011 Cardiac murmur, unspecified: Secondary | ICD-10-CM

## 2016-11-26 DIAGNOSIS — I071 Rheumatic tricuspid insufficiency: Secondary | ICD-10-CM | POA: Diagnosis not present

## 2016-11-26 DIAGNOSIS — I351 Nonrheumatic aortic (valve) insufficiency: Secondary | ICD-10-CM | POA: Diagnosis not present

## 2016-11-26 DIAGNOSIS — R0789 Other chest pain: Secondary | ICD-10-CM

## 2016-11-26 DIAGNOSIS — R002 Palpitations: Secondary | ICD-10-CM | POA: Diagnosis not present

## 2016-11-26 DIAGNOSIS — I340 Nonrheumatic mitral (valve) insufficiency: Secondary | ICD-10-CM | POA: Insufficient documentation

## 2016-11-26 DIAGNOSIS — E785 Hyperlipidemia, unspecified: Secondary | ICD-10-CM | POA: Insufficient documentation

## 2016-11-26 DIAGNOSIS — R110 Nausea: Secondary | ICD-10-CM

## 2016-11-26 DIAGNOSIS — R519 Headache, unspecified: Secondary | ICD-10-CM

## 2016-11-26 DIAGNOSIS — I119 Hypertensive heart disease without heart failure: Secondary | ICD-10-CM | POA: Diagnosis not present

## 2016-11-26 DIAGNOSIS — R51 Headache: Secondary | ICD-10-CM

## 2016-11-26 DIAGNOSIS — R0609 Other forms of dyspnea: Secondary | ICD-10-CM | POA: Diagnosis not present

## 2016-11-26 DIAGNOSIS — R252 Cramp and spasm: Secondary | ICD-10-CM

## 2016-11-26 DIAGNOSIS — Z8249 Family history of ischemic heart disease and other diseases of the circulatory system: Secondary | ICD-10-CM | POA: Diagnosis not present

## 2016-11-26 LAB — MYOCARDIAL PERFUSION IMAGING
CHL CUP NUCLEAR SRS: 11
CHL CUP RESTING HR STRESS: 58 {beats}/min
CSEPPHR: 85 {beats}/min
LV dias vol: 69 mL (ref 46–106)
LVSYSVOL: 17 mL
RATE: 0.3
SDS: 1
SSS: 12
TID: 1.28

## 2016-11-26 MED ORDER — TECHNETIUM TC 99M TETROFOSMIN IV KIT
10.2000 | PACK | Freq: Once | INTRAVENOUS | Status: AC | PRN
  Administered 2016-11-26: 10.2 via INTRAVENOUS
  Filled 2016-11-26: qty 11

## 2016-11-26 MED ORDER — TECHNETIUM TC 99M TETROFOSMIN IV KIT
32.3000 | PACK | Freq: Once | INTRAVENOUS | Status: AC | PRN
  Administered 2016-11-26: 32.3 via INTRAVENOUS
  Filled 2016-11-26: qty 33

## 2016-11-26 MED ORDER — REGADENOSON 0.4 MG/5ML IV SOLN
0.4000 mg | Freq: Once | INTRAVENOUS | Status: AC
  Administered 2016-11-26: 0.4 mg via INTRAVENOUS

## 2016-11-26 MED ORDER — AMINOPHYLLINE 25 MG/ML IV SOLN
75.0000 mg | Freq: Once | INTRAVENOUS | Status: AC
  Administered 2016-11-26: 75 mg via INTRAVENOUS

## 2016-11-26 NOTE — Telephone Encounter (Signed)
Pt has been notified of both the echo and myoview results by phone with verbal understanding. I will fax a copy of results to PCP.

## 2016-11-28 ENCOUNTER — Ambulatory Visit: Payer: Medicare Other | Admitting: Physician Assistant

## 2016-12-22 ENCOUNTER — Ambulatory Visit (INDEPENDENT_AMBULATORY_CARE_PROVIDER_SITE_OTHER): Payer: Medicare Other | Admitting: Family Medicine

## 2016-12-22 ENCOUNTER — Encounter: Payer: Self-pay | Admitting: Family Medicine

## 2016-12-22 VITALS — BP 138/66 | HR 68 | Temp 98.1°F | Resp 18 | Wt 153.0 lb

## 2016-12-22 DIAGNOSIS — F411 Generalized anxiety disorder: Secondary | ICD-10-CM | POA: Diagnosis not present

## 2016-12-22 DIAGNOSIS — H6121 Impacted cerumen, right ear: Secondary | ICD-10-CM

## 2016-12-22 DIAGNOSIS — R350 Frequency of micturition: Secondary | ICD-10-CM | POA: Diagnosis not present

## 2016-12-22 LAB — URINALYSIS, ROUTINE W REFLEX MICROSCOPIC
Bilirubin Urine: NEGATIVE
Glucose, UA: NEGATIVE
HGB URINE DIPSTICK: NEGATIVE
KETONES UR: NEGATIVE
NITRITE: NEGATIVE
SPECIFIC GRAVITY, URINE: 1.02 (ref 1.001–1.035)
pH: 6 (ref 5.0–8.0)

## 2016-12-22 LAB — URINALYSIS, MICROSCOPIC ONLY
Bacteria, UA: NONE SEEN [HPF]
CRYSTALS: NONE SEEN [HPF]
Casts: NONE SEEN [LPF]
RBC / HPF: NONE SEEN RBC/HPF (ref <=2)
Yeast: NONE SEEN [HPF]

## 2016-12-22 MED ORDER — TRAZODONE HCL 50 MG PO TABS: 50.0000 mg | ORAL_TABLET | Freq: Every evening | ORAL | 3 refills | Status: DC | PRN

## 2016-12-22 MED ORDER — CIPROFLOXACIN HCL 250 MG PO TABS: 250.0000 mg | ORAL_TABLET | Freq: Two times a day (BID) | ORAL | 0 refills | Status: DC

## 2016-12-22 NOTE — Addendum Note (Signed)
Addended by: Shary Decamp B on: 12/22/2016 01:05 PM   Modules accepted: Orders

## 2016-12-22 NOTE — Progress Notes (Signed)
Subjective:    Patient ID: Hannah Galvan, female    DOB: [birth year], 83 y.o.   MRN: [id number]  HPI  Patient reports 2 weeks of hearing loss in her right ear. She denies any pain. She denies any tinnitus. She denies any fever. Examination reveals a cerumen impaction in the right ear. She also complains of worsening anxiety. Frequently she will have the drain that her dead husband is in her home. She will wake up and try to find him in house. This is very disconcerting for the patient. She is also not sleeping very well as she wakes up 3 or 4 times every night to go use the bathroom and urinate. She reports a dull pressure-like headache between her eyes every morning. Past Medical History:  Diagnosis Date  . Allergy   . Anxiety   . Arthritis   . Blood transfusion without reported diagnosis   . Cancer ([place])    breast, s/p RXT, bilateral  . Cataract   . History of echocardiogram    a. Echo 8/16: Vigorous LVF, EF 38-10%, grade 1 diastolic dysfunction, trivial AI, mild MR, mild RVE, normal RV function, PASP 33 mmHg // b. Echo 1/17 EF 65-70%, normal wall motion, grade 1 diastolic dysfunction, trivial AI, MAC, mild MR // c. Echo 11/26/16: mild LVH, EF 60-65, mild AI, mild MR, mild LAE, mild to mod TR  . History of stress test    Lexiscan Myoview 8/16:  EF 81%, breast attenuation, no ischemia; Low Risk // Myoview 11/26/16: EF 75, apical defect c/w soft tissue attenuation, no ischemia or scar; Low Risk  . Hyperlipidemia   . Hypertension   . Hypothyroidism   . Shortness of breath   . Thyroid disease    Past Surgical History:  Procedure Laterality Date  . ABDOMINAL HYSTERECTOMY  2008  . bladder tact    . BREAST LUMPECTOMY  2000  . BREAST LUMPECTOMY  2007  . CATARACT EXTRACTION  2011  . JOINT REPLACEMENT    . KNEE SURGERY     right  . ROTATOR CUFF REPAIR     right  . THYROID SURGERY  2002  . TOTAL KNEE ARTHROPLASTY Left 06/02/2013   Dr Rhona Raider  . TOTAL KNEE ARTHROPLASTY Right 06/02/2013   Procedure: RIGHT TOTAL KNEE ARTHROPLASTY;  Surgeon: Hessie Dibble, MD;  Location: [place];  Service: Orthopedics;  Laterality: Right;  . TOTAL KNEE ARTHROPLASTY Left 08/22/2014   Procedure: TOTAL KNEE ARTHROPLASTY;  Surgeon: Hessie Dibble, MD;  Location: [place];  Service: Orthopedics;  Laterality: Left;   Current Outpatient Prescriptions on File Prior to Visit  Medication Sig Dispense Refill  . amLODipine (NORVASC) 5 MG tablet Take 1 tablet (5 mg total) by mouth daily. 90 tablet 3  . aspirin 81 MG tablet Take 81 mg by mouth daily.    . Calcium Carbonate-Vitamin D (CALCIUM + D) 600-200 MG-UNIT TABS Take 1 tablet by mouth daily.     . clonazePAM (KLONOPIN) 1 MG tablet TAKE 1 TABLET BY MOUTH TWICE A DAY AS NEEDED 60 tablet 2  . COMBIVENT RESPIMAT 20-100 MCG/ACT AERS respimat INHALE 1 PUFF INTO THE LUGNS EVERY 6 HOURS AS NEEDED FOR WHEEZING 2 Inhaler 0  . Cranberry 250 MG CAPS Take 250 mg by mouth daily.    Marland Kitchen exemestane (AROMASIN) 25 MG tablet TAKE 1 TABLET EVERY DAY 90 tablet 2  . folic acid (FOLVITE) 1 MG tablet Take 1 tablet (1 mg total) by mouth daily. 90 tablet 1  .  furosemide (LASIX) 20 MG tablet Take 1 tablet (20 mg total) by mouth daily. Can take 1 pill a day as needed for swelling 30 tablet 3  . KLOR-CON M20 20 MEQ tablet TAKE 1 TABLET BY MOUTH DAILY 90 tablet 3  . levothyroxine (SYNTHROID) 125 MCG tablet Take 1 tablet (125 mcg total) by mouth daily. 90 tablet 1  . LORazepam (ATIVAN) 1 MG tablet TAKE 1 TABLET BY MOUTH IN THE MORNING & 1 TAB 30 MIN PRIOR TO BEDTIME 60 tablet 3  . losartan (COZAAR) 50 MG tablet Take 1 tablet (50 mg total) by mouth daily. 90 tablet 4  . Multiple Vitamins-Minerals (CENTRUM SILVER ADULT 50+ PO) Take 1 tablet by mouth daily.     . nebivolol (BYSTOLIC) 10 MG tablet Take 10 mg by mouth daily.    . nitroGLYCERIN (NITROSTAT) 0.4 MG SL tablet Place 1 tablet (0.4 mg total) under the tongue every 5 (five) minutes as needed for chest pain. 25 tablet 3  . nystatin  ointment (MYCOSTATIN) Apply 1 application topically daily.    . pantoprazole (PROTONIX) 40 MG tablet Take 1 tablet (40 mg total) by mouth as directed. 40 MG DAILY FOR 1 MONTH THEN AS NEEDED 90 tablet 3  . rosuvastatin (CRESTOR) 10 MG tablet Take 1 tablet (10 mg total) by mouth daily at 6 PM. 90 tablet 4  . vortioxetine HBr (TRINTELLIX) 10 MG TABS Take 10 mg by mouth daily.      No current facility-administered medications on file prior to visit.    Allergies  Allergen Reactions  . Ambien [Zolpidem]     confusion  . Pyridium [Phenazopyridine Hcl] Other (See Comments)    hemolysis  . Sulfa Antibiotics Rash   Social History   Social History  . Marital status: Widowed    Spouse name: N/A  . Number of children: N/A  . Years of education: N/A   Occupational History  . Not on file.   Social History Main Topics  . Smoking status: Never Smoker  . Smokeless tobacco: Never Used     Comment: never used tobacco  . Alcohol use No  . Drug use: No  . Sexual activity: Not Currently   Other Topics Concern  . Not on file   Social History Narrative  . No narrative on file     Review of Systems  All other systems reviewed and are negative.      Objective:   Physical Exam  Constitutional: She appears well-developed and well-nourished.  Cardiovascular: Normal rate, regular rhythm and normal heart sounds.   Pulmonary/Chest: Effort normal and breath sounds normal. No respiratory distress. She has no wheezes. She has no rales.    Right cerumen impaction      Assessment & Plan:  Frequent urination - Plan: Urinalysis, Routine w reflex microscopic  GAD (generalized anxiety disorder)  Hearing loss of right ear due to cerumen impaction  Cerumen impaction is removed without complication using irrigation and lavage. I believe the headache the patient is experiencing in the morning as a tension headache likely due to her poor sleep hygiene and insomnia. The majority of her symptoms  sound like anxiety which is mainly worse at night including the dreams of her husband who has passed away which causes her tremendous anxiety at night. I feel like if I can help patient sleep better, many of her symptoms would improve. She has a documented allergy to Ambien. I will try the patient on trazodone 50 mg by mouth daily  at bedtime to see if her nightmares and nocturnal anxiety will improve with better sleep. I believe she will also have fewer headaches if she sleeps more soundly through the night  Urinalysis does show +1 leukocyte esterase. It also shows 6-10 white blood cells per high-powered field. It is possible she may also have a slight urinary tract infection which I will treat with cipro 250 mg tablets by mouth twice a day for 3 days

## 2017-01-05 ENCOUNTER — Telehealth: Payer: Self-pay | Admitting: *Deleted

## 2017-01-05 ENCOUNTER — Ambulatory Visit (INDEPENDENT_AMBULATORY_CARE_PROVIDER_SITE_OTHER): Payer: Medicare Other | Admitting: Family Medicine

## 2017-01-05 VITALS — BP 130/70 | HR 80 | Temp 97.7°F | Resp 14 | Wt 154.0 lb

## 2017-01-05 DIAGNOSIS — N390 Urinary tract infection, site not specified: Secondary | ICD-10-CM

## 2017-01-05 DIAGNOSIS — F5101 Primary insomnia: Secondary | ICD-10-CM | POA: Diagnosis not present

## 2017-01-05 DIAGNOSIS — F411 Generalized anxiety disorder: Secondary | ICD-10-CM

## 2017-01-05 LAB — URINALYSIS, ROUTINE W REFLEX MICROSCOPIC
Bilirubin Urine: NEGATIVE
GLUCOSE, UA: NEGATIVE
HGB URINE DIPSTICK: NEGATIVE
Ketones, ur: NEGATIVE
NITRITE: NEGATIVE
PH: 7 (ref 5.0–8.0)
PROTEIN: NEGATIVE
Specific Gravity, Urine: 1.01 (ref 1.001–1.035)

## 2017-01-05 LAB — URINALYSIS, MICROSCOPIC ONLY
Casts: NONE SEEN [LPF]
Crystals: NONE SEEN [HPF]
YEAST: NONE SEEN [HPF]

## 2017-01-05 MED ORDER — PAROXETINE HCL 20 MG PO TABS: 20.0000 mg | ORAL_TABLET | Freq: Every day | ORAL | 5 refills | Status: DC

## 2017-01-05 NOTE — Progress Notes (Signed)
Subjective:    Patient ID: Hannah Galvan, female    DOB: 1931/01/03, 83 y.o.   MRN: [id number]  HPI 12/22/16 Patient reports 2 weeks of hearing loss in her right ear. She denies any pain. She denies any tinnitus. She denies any fever. Examination reveals a cerumen impaction in the right ear. She also complains of worsening anxiety. Frequently she will have the dream that her dead husband is in her home. She will wake up and try to find him in house. This is very disconcerting for the patient. She is also not sleeping very well as she wakes up 3 or 4 times every night to go use the bathroom and urinate. She reports a dull pressure-like headache between her eyes every morning.  AT that time, my plan was: Cerumen impaction is removed without complication using irrigation and lavage. I believe the headache the patient is experiencing in the morning as a tension headache likely due to her poor sleep hygiene and insomnia. The majority of her symptoms sound like anxiety which is mainly worse at night including the dreams of her husband who has passed away which causes her tremendous anxiety at night. I feel like if I can help patient sleep better, many of her symptoms would improve. She has a documented allergy to Ambien. I will try the patient on trazodone 50 mg by mouth daily at bedtime to see if her nightmares and nocturnal anxiety will improve with better sleep. I believe she will also have fewer headaches if she sleeps more soundly through the night.  Urinalysis does show +1 leukocyte esterase. It also shows 6-10 white blood cells per high-powered field. It is possible she may also have a slight urinary tract infection which I will treat with cipro 250 mg tablets by mouth twice a day for 3 days  01/05/17 Patient never tried the trazodone. She was afraid to take the trazodone and the Klonopin together. Furthermore she is running out of Trintellix and I have no further samples to give her.  She continues to  deal with loneliness depression and generalized anxiety. She continues to have a difficult time sleeping. She is not resting well. Her primary concern is constantly feeling anxious, constantly feeling tired, not sleeping well. She is also extremely lonely. She feels trapped in her home. She states that she seldom gets to see her son were spent any time with him because he is always busy. She has a sister who lives in [place] but she is afraid to go visit her.  Past Medical History:  Diagnosis Date  . Allergy   . Anxiety   . Arthritis   . Blood transfusion without reported diagnosis   . Cancer ([place])    breast, s/p RXT, bilateral  . Cataract   . History of echocardiogram    a. Echo 8/16: Vigorous LVF, EF 49-20%, grade 1 diastolic dysfunction, trivial AI, mild MR, mild RVE, normal RV function, PASP 33 mmHg // b. Echo 1/17 EF 65-70%, normal wall motion, grade 1 diastolic dysfunction, trivial AI, MAC, mild MR // c. Echo 11/26/16: mild LVH, EF 60-65, mild AI, mild MR, mild LAE, mild to mod TR  . History of stress test    Lexiscan Myoview 8/16:  EF 81%, breast attenuation, no ischemia; Low Risk // Myoview 11/26/16: EF 75, apical defect c/w soft tissue attenuation, no ischemia or scar; Low Risk  . Hyperlipidemia   . Hypertension   . Hypothyroidism   . Shortness of breath   .  Thyroid disease    Past Surgical History:  Procedure Laterality Date  . ABDOMINAL HYSTERECTOMY  2008  . bladder tact    . BREAST LUMPECTOMY  2000  . BREAST LUMPECTOMY  2007  . CATARACT EXTRACTION  2011  . JOINT REPLACEMENT    . KNEE SURGERY     right  . ROTATOR CUFF REPAIR     right  . THYROID SURGERY  2002  . TOTAL KNEE ARTHROPLASTY Left 06/02/2013   Dr Rhona Raider  . TOTAL KNEE ARTHROPLASTY Right 06/02/2013   Procedure: RIGHT TOTAL KNEE ARTHROPLASTY;  Surgeon: Hessie Dibble, MD;  Location: [facility];  Service: Orthopedics;  Laterality: Right;  . TOTAL KNEE ARTHROPLASTY Left 08/22/2014   Procedure: TOTAL KNEE ARTHROPLASTY;   Surgeon: Hessie Dibble, MD;  Location: [facility];  Service: Orthopedics;  Laterality: Left;   Current Outpatient Prescriptions on File Prior to Visit  Medication Sig Dispense Refill  . amLODipine (NORVASC) 5 MG tablet Take 1 tablet (5 mg total) by mouth daily. 90 tablet 3  . aspirin 81 MG tablet Take 81 mg by mouth daily.    . Calcium Carbonate-Vitamin D (CALCIUM + D) 600-200 MG-UNIT TABS Take 1 tablet by mouth daily.     . ciprofloxacin (CIPRO) 250 MG tablet Take 1 tablet (250 mg total) by mouth 2 (two) times daily. 6 tablet 0  . clonazePAM (KLONOPIN) 1 MG tablet TAKE 1 TABLET BY MOUTH TWICE A DAY AS NEEDED 60 tablet 2  . COMBIVENT RESPIMAT 20-100 MCG/ACT AERS respimat INHALE 1 PUFF INTO THE LUGNS EVERY 6 HOURS AS NEEDED FOR WHEEZING 2 Inhaler 0  . Cranberry 250 MG CAPS Take 250 mg by mouth daily.    Marland Kitchen exemestane (AROMASIN) 25 MG tablet TAKE 1 TABLET EVERY DAY 90 tablet 2  . folic acid (FOLVITE) 1 MG tablet Take 1 tablet (1 mg total) by mouth daily. 90 tablet 1  . furosemide (LASIX) 20 MG tablet Take 1 tablet (20 mg total) by mouth daily. Can take 1 pill a day as needed for swelling 30 tablet 3  . KLOR-CON M20 20 MEQ tablet TAKE 1 TABLET BY MOUTH DAILY 90 tablet 3  . levothyroxine (SYNTHROID) 125 MCG tablet Take 1 tablet (125 mcg total) by mouth daily. 90 tablet 1  . losartan (COZAAR) 50 MG tablet Take 1 tablet (50 mg total) by mouth daily. 90 tablet 4  . Multiple Vitamins-Minerals (CENTRUM SILVER ADULT 50+ PO) Take 1 tablet by mouth daily.     . nebivolol (BYSTOLIC) 10 MG tablet Take 10 mg by mouth daily.    . nitroGLYCERIN (NITROSTAT) 0.4 MG SL tablet Place 1 tablet (0.4 mg total) under the tongue every 5 (five) minutes as needed for chest pain. 25 tablet 3  . nystatin ointment (MYCOSTATIN) Apply 1 application topically daily.    . pantoprazole (PROTONIX) 40 MG tablet Take 1 tablet (40 mg total) by mouth as directed. 40 MG DAILY FOR 1 MONTH THEN AS NEEDED 90 tablet 3  . rosuvastatin (CRESTOR)  10 MG tablet Take 1 tablet (10 mg total) by mouth daily at 6 PM. 90 tablet 4  . traZODone (DESYREL) 50 MG tablet Take 1 tablet (50 mg total) by mouth at bedtime as needed for sleep. 30 tablet 3  . vortioxetine HBr (TRINTELLIX) 10 MG TABS Take 10 mg by mouth daily.      No current facility-administered medications on file prior to visit.    Allergies  Allergen Reactions  . Ambien [Zolpidem]  confusion  . Pyridium [Phenazopyridine Hcl] Other (See Comments)    hemolysis  . Sulfa Antibiotics Rash   Social History   Social History  . Marital status: Widowed    Spouse name: N/A  . Number of children: N/A  . Years of education: N/A   Occupational History  . Not on file.   Social History Main Topics  . Smoking status: Never Smoker  . Smokeless tobacco: Never Used     Comment: never used tobacco  . Alcohol use No  . Drug use: No  . Sexual activity: Not Currently   Other Topics Concern  . Not on file   Social History Narrative  . No narrative on file     Review of Systems  All other systems reviewed and are negative.      Objective:   Physical Exam  Constitutional: She appears well-developed and well-nourished.  Cardiovascular: Normal rate, regular rhythm and normal heart sounds.   Pulmonary/Chest: Effort normal and breath sounds normal. No respiratory distress. She has no wheezes. She has no rales.       Assessment & Plan:  Urinary tract infection without hematuria, site unspecified - Plan: Urinalysis, Routine w reflex microscopic  GAD  Insomnia I'll repeat a urinalysis to ensure resolution of urinary tract infection. At the present time, I believe the majority of the patient's problems stem from uncontrolled anxiety, insomnia, and general loneliness. I recommended that she sit down with her son and explain to him how she feels. I recommended that she tell him that she wants him to spend more time with her. He may npo recognize how lonely she is.  She could  consider moving to an assisted living facility like [facility] where there may be more people there her age that she can interact with. Consider visiting [place] and seeing her sister for a few weeks just to get out of the house and interact with other people. I don't believe that her anxiety is going to improve until she is able to address her loneliness. Use trazodone 50 mg at night as needed for sleep. I explained to the patient that is okay to take this medicine with Klonopin. Discontinue Trintellix and replace with Paxil 20 mg a day.

## 2017-01-05 NOTE — Telephone Encounter (Signed)
Received request from pharmacy for PA on Paxil.  PA submitted.   Dx: Anxiety with Depression  [facility] is reviewing your PA request. Typically an electronic response will be received within 72 hours. To check for an update later, open this request from your dashboard.

## 2017-01-07 NOTE — Telephone Encounter (Signed)
Insurance will not cover Paxil without trying 1st Bupropion, Sertraline, venlafaxine or Buspirone. Do you want to change or I could send her to [facility] as it is on their 4 dollar list???

## 2017-01-08 MED ORDER — SERTRALINE HCL 50 MG PO TABS: 50.0000 mg | ORAL_TABLET | Freq: Every day | ORAL | 5 refills | Status: DC

## 2017-01-08 NOTE — Telephone Encounter (Signed)
Med sent to pharm and pt aware 

## 2017-01-08 NOTE — Telephone Encounter (Signed)
Ok with change to zoloft 50 mg poqhs instead of paxil.

## 2017-01-20 ENCOUNTER — Telehealth: Payer: Self-pay | Admitting: Family Medicine

## 2017-01-20 ENCOUNTER — Ambulatory Visit (INDEPENDENT_AMBULATORY_CARE_PROVIDER_SITE_OTHER): Payer: Medicare Other | Admitting: Family Medicine

## 2017-01-20 ENCOUNTER — Encounter: Payer: Self-pay | Admitting: Family Medicine

## 2017-01-20 VITALS — BP 138/74 | HR 80 | Temp 98.0°F | Resp 20 | Ht 62.5 in | Wt 156.0 lb

## 2017-01-20 DIAGNOSIS — W1800XA Striking against unspecified object with subsequent fall, initial encounter: Secondary | ICD-10-CM

## 2017-01-20 DIAGNOSIS — S060X0A Concussion without loss of consciousness, initial encounter: Secondary | ICD-10-CM | POA: Diagnosis not present

## 2017-01-20 NOTE — Progress Notes (Signed)
Subjective:    Patient ID: Hannah Galvan, female    DOB: 12-09-1930, 83 y.o.   MRN: [id number]  HPI Thursday night, the patient tripped on the carpet and fell striking her head on the floor. She struck directly on her forehead above her eyes. She now has ecchymoses below both eyes and across her nasal bridge. She also suffered bruising to her right breast and her left arm during the fall. She now has a headache between her eyes. The pressure is 6 out of 10. It started this morning. The previous 4 days she did not have a headache. She denies any loss of consciousness after the fall although she was dazed for several minutes. She has fallen 3 times in the last year according to her family. She tends to trip for shuffling gait. He denies any neurologic deficits. She denies any nausea vomiting or vision changes. Past Medical History:  Diagnosis Date  . Allergy   . Anxiety   . Arthritis   . Blood transfusion without reported diagnosis   . Cancer ([place])    breast, s/p RXT, bilateral  . Cataract   . History of echocardiogram    a. Echo 8/16: Vigorous LVF, EF 37-34%, grade 1 diastolic dysfunction, trivial AI, mild MR, mild RVE, normal RV function, PASP 33 mmHg // b. Echo 1/17 EF 65-70%, normal wall motion, grade 1 diastolic dysfunction, trivial AI, MAC, mild MR // c. Echo 11/26/16: mild LVH, EF 60-65, mild AI, mild MR, mild LAE, mild to mod TR  . History of stress test    Lexiscan Myoview 8/16:  EF 81%, breast attenuation, no ischemia; Low Risk // Myoview 11/26/16: EF 75, apical defect c/w soft tissue attenuation, no ischemia or scar; Low Risk  . Hyperlipidemia   . Hypertension   . Hypothyroidism   . Shortness of breath   . Thyroid disease    Past Surgical History:  Procedure Laterality Date  . ABDOMINAL HYSTERECTOMY  2008  . bladder tact    . BREAST LUMPECTOMY  2000  . BREAST LUMPECTOMY  2007  . CATARACT EXTRACTION  2011  . JOINT REPLACEMENT    . KNEE SURGERY     right  . ROTATOR CUFF REPAIR      right  . THYROID SURGERY  2002  . TOTAL KNEE ARTHROPLASTY Left 06/02/2013   Dr Rhona Raider  . TOTAL KNEE ARTHROPLASTY Right 06/02/2013   Procedure: RIGHT TOTAL KNEE ARTHROPLASTY;  Surgeon: Hessie Dibble, MD;  Location: [facility];  Service: Orthopedics;  Laterality: Right;  . TOTAL KNEE ARTHROPLASTY Left 08/22/2014   Procedure: TOTAL KNEE ARTHROPLASTY;  Surgeon: Hessie Dibble, MD;  Location: [facility];  Service: Orthopedics;  Laterality: Left;   Current Outpatient Prescriptions on File Prior to Visit  Medication Sig Dispense Refill  . aspirin 81 MG tablet Take 81 mg by mouth daily.    . Calcium Carbonate-Vitamin D (CALCIUM + D) 600-200 MG-UNIT TABS Take 1 tablet by mouth daily.     . clonazePAM (KLONOPIN) 1 MG tablet TAKE 1 TABLET BY MOUTH TWICE A DAY AS NEEDED 60 tablet 2  . COMBIVENT RESPIMAT 20-100 MCG/ACT AERS respimat INHALE 1 PUFF INTO THE LUGNS EVERY 6 HOURS AS NEEDED FOR WHEEZING 2 Inhaler 0  . Cranberry 250 MG CAPS Take 250 mg by mouth daily.    Marland Kitchen exemestane (AROMASIN) 25 MG tablet TAKE 1 TABLET EVERY DAY 90 tablet 2  . folic acid (FOLVITE) 1 MG tablet Take 1 tablet (1 mg total)  by mouth daily. 90 tablet 1  . furosemide (LASIX) 20 MG tablet Take 1 tablet (20 mg total) by mouth daily. Can take 1 pill a day as needed for swelling 30 tablet 3  . KLOR-CON M20 20 MEQ tablet TAKE 1 TABLET BY MOUTH DAILY 90 tablet 3  . levothyroxine (SYNTHROID) 125 MCG tablet Take 1 tablet (125 mcg total) by mouth daily. 90 tablet 1  . losartan (COZAAR) 50 MG tablet Take 1 tablet (50 mg total) by mouth daily. 90 tablet 4  . Multiple Vitamins-Minerals (CENTRUM SILVER ADULT 50+ PO) Take 1 tablet by mouth daily.     . nitroGLYCERIN (NITROSTAT) 0.4 MG SL tablet Place 1 tablet (0.4 mg total) under the tongue every 5 (five) minutes as needed for chest pain. 25 tablet 3  . nystatin ointment (MYCOSTATIN) Apply 1 application topically daily.    . pantoprazole (PROTONIX) 40 MG tablet Take 1 tablet (40 mg total) by  mouth as directed. 40 MG DAILY FOR 1 MONTH THEN AS NEEDED 90 tablet 3  . PARoxetine (PAXIL) 20 MG tablet Take 1 tablet (20 mg total) by mouth daily. This medication replaces Trintellix, stop Trintellix. 30 tablet 5  . rosuvastatin (CRESTOR) 10 MG tablet Take 1 tablet (10 mg total) by mouth daily at 6 PM. 90 tablet 4  . sertraline (ZOLOFT) 50 MG tablet Take 1 tablet (50 mg total) by mouth at bedtime. 30 tablet 5  . traZODone (DESYREL) 50 MG tablet Take 1 tablet (50 mg total) by mouth at bedtime as needed for sleep. 30 tablet 3   No current facility-administered medications on file prior to visit.    Allergies  Allergen Reactions  . Ambien [Zolpidem]     confusion  . Pyridium [Phenazopyridine Hcl] Other (See Comments)    hemolysis  . Sulfa Antibiotics Rash   Social History   Social History  . Marital status: Widowed    Spouse name: N/A  . Number of children: N/A  . Years of education: N/A   Occupational History  . Not on file.   Social History Main Topics  . Smoking status: Never Smoker  . Smokeless tobacco: Never Used     Comment: never used tobacco  . Alcohol use No  . Drug use: No  . Sexual activity: Not Currently   Other Topics Concern  . Not on file   Social History Narrative  . No narrative on file       Review of Systems  All other systems reviewed and are negative.      Objective:   Physical Exam  Constitutional: She is oriented to person, place, and time. She appears well-developed and well-nourished. No distress.  HENT:  Head: Normocephalic.  Right Ear: External ear normal.  Left Ear: External ear normal.  Nose: Nose normal.  Mouth/Throat: Oropharynx is clear and moist. No oropharyngeal exudate.  Eyes: Conjunctivae and EOM are normal. Pupils are equal, round, and reactive to light. Right eye exhibits no discharge. Left eye exhibits no discharge.  Neck: Normal range of motion. Neck supple.  Cardiovascular: Normal rate, regular rhythm and normal  heart sounds.  Exam reveals no gallop and no friction rub.   No murmur heard. Pulmonary/Chest: Effort normal and breath sounds normal. No stridor. No respiratory distress. She has no wheezes. She has no rales. She exhibits no tenderness.  Abdominal: Soft. Bowel sounds are normal.  Lymphadenopathy:    She has no cervical adenopathy.  Neurological: She is alert and oriented to person, place,  and time. She has normal reflexes. She displays normal reflexes. No cranial nerve deficit. She exhibits normal muscle tone. Coordination normal.  Skin: She is not diaphoretic.  Vitals reviewed.         Assessment & Plan:  Fall against object - Plan: Ambulatory referral to [facility] without loss of consciousness, initial encounter  Believe the patient fell and suffered a concussion when she struck her head after the fall. I see no evidence of neurologic deficit on her physical exam today. I have a very low index of suspicion for an intracranial bleed. I believe the headache is due to concussion. She was given 30 mg of Toradol while in office IM and the headache eased off. She was monitored for more than an hour in the office. I called her family and recommended that they stay with her tonight to monitor her and should her headache worsened take her to the emergency room for a CT scan. Patient was also having a slight panic attack when she got here this afternoon. She reported tachycardia feeling extremely nervous and anxious. Her family brought her one Klonopin and gave it to her and the panic attack subsided and the patient felt much better. I do believe she would benefit from physical therapy given her frequency of falls and I will schedule this for her at home

## 2017-01-20 NOTE — Telephone Encounter (Signed)
pts son is concerned she may have dementia, wants to know how to go about finding out if she does please call.

## 2017-01-21 MED ORDER — KETOROLAC TROMETHAMINE 60 MG/2ML IM SOLN
30.0000 mg | Freq: Once | INTRAMUSCULAR | Status: AC
  Administered 2017-01-20: 30 mg via INTRAMUSCULAR

## 2017-01-21 NOTE — Addendum Note (Signed)
Addended by: Shary Decamp B on: 01/21/2017 04:03 PM   Modules accepted: Orders

## 2017-01-22 NOTE — Telephone Encounter (Signed)
Spoke to son.  He is concerned.  Mother lives alone.  Have gone to home where she has left gas stove on.  Leaving lights on, electric bill has doubled.  Forgets who people are.  Made appt to see provider for cognitive assessments.

## 2017-01-22 NOTE — Telephone Encounter (Signed)
I believe she does have mild dementia already based on previous encounters.  At that time, it was not severe enough for namenda or aricept.  However, that is part of the reason I would suggest looking into assisted living arrangements.  If he is concerned it is worsening, we could perform mini mental status exam, montreal cognitive assessment, etc and consider starting the medicine to try to delay progression if her scores are significant.

## 2017-01-26 NOTE — Telephone Encounter (Signed)
Received approval through ins approving Paxil through 11/23/17. Pt was changed to zoloft but if that is not working may change to Paxil.

## 2017-01-27 ENCOUNTER — Ambulatory Visit (INDEPENDENT_AMBULATORY_CARE_PROVIDER_SITE_OTHER): Payer: Medicare Other | Admitting: Family Medicine

## 2017-01-27 VITALS — BP 160/86 | HR 80 | Temp 97.9°F | Resp 16 | Ht 62.5 in | Wt 155.0 lb

## 2017-01-27 DIAGNOSIS — F0280 Dementia in other diseases classified elsewhere without behavioral disturbance: Secondary | ICD-10-CM | POA: Diagnosis not present

## 2017-01-27 DIAGNOSIS — G309 Alzheimer's disease, unspecified: Secondary | ICD-10-CM | POA: Diagnosis not present

## 2017-01-27 MED ORDER — DONEPEZIL HCL 5 MG PO TABS: 5.0000 mg | ORAL_TABLET | Freq: Every day | ORAL | 5 refills | Status: DC

## 2017-01-27 NOTE — Progress Notes (Signed)
Subjective:    Patient ID: Hannah Galvan, female    DOB: [birth year], 83 y.o.   MRN: [id number]  HPI Patient is here today with her family to discuss memory loss. They have seen several concerning behaviors including leaving on the stove, forgetting how to use household appliances that she has previously used, forgetting conversations. The patient has to be repeatedly told things and forgets what she has been told.  Symptoms have become gradually apparent over the last few month.  She also has occasional anomia.   I started by performing a Mini-Mental status exam on the patient.  She quickly says the date including the day of the month the season. However she becomes very confused on the year. Regarding location, she is unable to tell him the name of the town that the clinic is in. On rapid recall she can only remember 1 out of 3 objects. She scores 2 out of 5 in spelling world in reverse. I gave her the opportunity to perform serial sevens however she did just is poorly. The focal time copying intersecting pentagons. She is able to follow multistep commands.  She is unable to correctly draw all the time on the clock milligram. On the Montral cognitive assessment, the patient is unable to follow the pattern using letters and numbers. She scores 22 on a scale 30 on Mini-Mental status exam Past Medical History:  Diagnosis Date  . Allergy   . Anxiety   . Arthritis   . Blood transfusion without reported diagnosis   . Cancer ([place])    breast, s/p RXT, bilateral  . Cataract   . History of echocardiogram    a. Echo 8/16: Vigorous LVF, EF 77-93%, grade 1 diastolic dysfunction, trivial AI, mild MR, mild RVE, normal RV function, PASP 33 mmHg // b. Echo 1/17 EF 65-70%, normal wall motion, grade 1 diastolic dysfunction, trivial AI, MAC, mild MR // c. Echo 11/26/16: mild LVH, EF 60-65, mild AI, mild MR, mild LAE, mild to mod TR  . History of stress test    Lexiscan Myoview 8/16:  EF 81%, breast attenuation, no  ischemia; Low Risk // Myoview 11/26/16: EF 75, apical defect c/w soft tissue attenuation, no ischemia or scar; Low Risk  . Hyperlipidemia   . Hypertension   . Hypothyroidism   . Shortness of breath   . Thyroid disease    Past Surgical History:  Procedure Laterality Date  . ABDOMINAL HYSTERECTOMY  2008  . bladder tact    . BREAST LUMPECTOMY  2000  . BREAST LUMPECTOMY  2007  . CATARACT EXTRACTION  2011  . JOINT REPLACEMENT    . KNEE SURGERY     right  . ROTATOR CUFF REPAIR     right  . THYROID SURGERY  2002  . TOTAL KNEE ARTHROPLASTY Left 06/02/2013   Dr Rhona Raider  . TOTAL KNEE ARTHROPLASTY Right 06/02/2013   Procedure: RIGHT TOTAL KNEE ARTHROPLASTY;  Surgeon: Hessie Dibble, MD;  Location: [facility];  Service: Orthopedics;  Laterality: Right;  . TOTAL KNEE ARTHROPLASTY Left 08/22/2014   Procedure: TOTAL KNEE ARTHROPLASTY;  Surgeon: Hessie Dibble, MD;  Location: [facility];  Service: Orthopedics;  Laterality: Left;   Current Outpatient Prescriptions on File Prior to Visit  Medication Sig Dispense Refill  . aspirin 81 MG tablet Take 81 mg by mouth daily.    . Calcium Carbonate-Vitamin D (CALCIUM + D) 600-200 MG-UNIT TABS Take 1 tablet by mouth daily.     . clonazePAM (KLONOPIN)  1 MG tablet TAKE 1 TABLET BY MOUTH TWICE A DAY AS NEEDED 60 tablet 2  . COMBIVENT RESPIMAT 20-100 MCG/ACT AERS respimat INHALE 1 PUFF INTO THE LUGNS EVERY 6 HOURS AS NEEDED FOR WHEEZING 2 Inhaler 0  . Cranberry 250 MG CAPS Take 250 mg by mouth daily.    Marland Kitchen exemestane (AROMASIN) 25 MG tablet TAKE 1 TABLET EVERY DAY 90 tablet 2  . folic acid (FOLVITE) 1 MG tablet Take 1 tablet (1 mg total) by mouth daily. 90 tablet 1  . furosemide (LASIX) 20 MG tablet Take 1 tablet (20 mg total) by mouth daily. Can take 1 pill a day as needed for swelling 30 tablet 3  . KLOR-CON M20 20 MEQ tablet TAKE 1 TABLET BY MOUTH DAILY 90 tablet 3  . levothyroxine (SYNTHROID) 125 MCG tablet Take 1 tablet (125 mcg total) by mouth daily. 90 tablet 1   . losartan (COZAAR) 50 MG tablet Take 1 tablet (50 mg total) by mouth daily. 90 tablet 4  . Multiple Vitamins-Minerals (CENTRUM SILVER ADULT 50+ PO) Take 1 tablet by mouth daily.     . nitroGLYCERIN (NITROSTAT) 0.4 MG SL tablet Place 1 tablet (0.4 mg total) under the tongue every 5 (five) minutes as needed for chest pain. 25 tablet 3  . nystatin ointment (MYCOSTATIN) Apply 1 application topically daily.    . pantoprazole (PROTONIX) 40 MG tablet Take 1 tablet (40 mg total) by mouth as directed. 40 MG DAILY FOR 1 MONTH THEN AS NEEDED 90 tablet 3  . PARoxetine (PAXIL) 20 MG tablet Take 1 tablet (20 mg total) by mouth daily. This medication replaces Trintellix, stop Trintellix. 30 tablet 5  . rosuvastatin (CRESTOR) 10 MG tablet Take 1 tablet (10 mg total) by mouth daily at 6 PM. 90 tablet 4  . sertraline (ZOLOFT) 50 MG tablet Take 1 tablet (50 mg total) by mouth at bedtime. 30 tablet 5  . traZODone (DESYREL) 50 MG tablet Take 1 tablet (50 mg total) by mouth at bedtime as needed for sleep. 30 tablet 3   No current facility-administered medications on file prior to visit.    Allergies  Allergen Reactions  . Ambien [Zolpidem]     confusion  . Pyridium [Phenazopyridine Hcl] Other (See Comments)    hemolysis  . Sulfa Antibiotics Rash   Social History   Social History  . Marital status: Widowed    Spouse name: N/A  . Number of children: N/A  . Years of education: N/A   Occupational History  . Not on file.   Social History Main Topics  . Smoking status: Never Smoker  . Smokeless tobacco: Never Used     Comment: never used tobacco  . Alcohol use No  . Drug use: No  . Sexual activity: Not Currently   Other Topics Concern  . Not on file   Social History Narrative  . No narrative on file   Family History  Problem Relation Age of Onset  . Hypertension Mother   . Stroke Mother   . Cancer Father   . Cancer Sister     leukemia  . Cancer Brother     leukemia  . Cancer Sister      vaginal  . Cancer Sister     bladder  . Cancer Brother     prostate, skin  . Cancer Brother     colon  . Cancer Brother     prostate, bone mets  . Cancer Brother     skin  .  Heart disease Sister       Review of Systems  All other systems reviewed and are negative.      Objective:   Physical Exam  Constitutional: She is oriented to person, place, and time. She appears well-developed and well-nourished. No distress.  HENT:  Head: Normocephalic and atraumatic.  Right Ear: External ear normal.  Left Ear: External ear normal.  Nose: Nose normal.  Mouth/Throat: Oropharynx is clear and moist. No oropharyngeal exudate.  Eyes: Conjunctivae and EOM are normal. Pupils are equal, round, and reactive to light. Right eye exhibits no discharge. Left eye exhibits no discharge. No scleral icterus.  Neck: Normal range of motion. Neck supple. No thyromegaly present.  Cardiovascular: Normal rate, regular rhythm and normal heart sounds.   Pulmonary/Chest: Effort normal and breath sounds normal. No respiratory distress. She has no wheezes. She has no rales.  Abdominal: Soft. Bowel sounds are normal.  Musculoskeletal: Normal range of motion.  Neurological: She is alert and oriented to person, place, and time. She has normal reflexes. She displays normal reflexes. No cranial nerve deficit. She exhibits normal muscle tone. Coordination normal.  Skin: She is not diaphoretic.  Psychiatric: Her speech is normal and behavior is normal. Judgment and thought content normal. She exhibits a depressed mood.  Vitals reviewed.         Assessment & Plan:  Alzheimer's dementia without behavioral disturbance, unspecified timing of dementia onset  I believe the patient is showing early signs of Alzheimer's disease. Begin Aricept 5 mg by mouth daily and increase to 10 mg by mouth daily in one month if the patient tolerates the medication. I would quickly add Namenda if tolerable. Also believe her dementia is  complicated by severe depression and loneliness. I have recommended increasing her interaction with other people to stimulate her cognitively. Also recommended reading books, word searches, and other activities that stimulate her brain. I recommended regular exercise as a progression.

## 2017-01-30 ENCOUNTER — Other Ambulatory Visit: Payer: Medicare Other

## 2017-01-30 DIAGNOSIS — E785 Hyperlipidemia, unspecified: Secondary | ICD-10-CM

## 2017-01-30 DIAGNOSIS — I10 Essential (primary) hypertension: Secondary | ICD-10-CM

## 2017-01-30 DIAGNOSIS — Z79899 Other long term (current) drug therapy: Secondary | ICD-10-CM

## 2017-01-30 DIAGNOSIS — E039 Hypothyroidism, unspecified: Secondary | ICD-10-CM

## 2017-01-30 LAB — CBC WITH DIFFERENTIAL/PLATELET
BASOS PCT: 1 %
Basophils Absolute: 81 cells/uL (ref 0–200)
EOS ABS: 162 {cells}/uL (ref 15–500)
EOS PCT: 2 %
HCT: 44 % (ref 35.0–45.0)
Hemoglobin: 14.2 g/dL (ref 12.0–15.0)
LYMPHS PCT: 34 %
Lymphs Abs: 2754 cells/uL (ref 850–3900)
MCH: 30.7 pg (ref 27.0–33.0)
MCHC: 32.3 g/dL (ref 32.0–36.0)
MCV: 95.2 fL (ref 80.0–100.0)
MONOS PCT: 9 %
MPV: 10.4 fL (ref 7.5–12.5)
Monocytes Absolute: 729 cells/uL (ref 200–950)
NEUTROS ABS: 4374 {cells}/uL (ref 1500–7800)
Neutrophils Relative %: 54 %
PLATELETS: 222 10*3/uL (ref 140–400)
RBC: 4.62 MIL/uL (ref 3.80–5.10)
RDW: 14.5 % (ref 11.0–15.0)
WBC: 8.1 10*3/uL (ref 3.8–10.8)

## 2017-01-30 LAB — COMPLETE METABOLIC PANEL WITH GFR
ALBUMIN: 4.5 g/dL (ref 3.6–5.1)
ALK PHOS: 56 U/L (ref 33–130)
ALT: 24 U/L (ref 6–29)
AST: 28 U/L (ref 10–35)
BILIRUBIN TOTAL: 0.3 mg/dL (ref 0.2–1.2)
BUN: 17 mg/dL (ref 7–25)
CALCIUM: 9.5 mg/dL (ref 8.6–10.4)
CO2: 28 mmol/L (ref 20–31)
Chloride: 104 mmol/L (ref 98–110)
Creat: 0.95 mg/dL — ABNORMAL HIGH (ref 0.60–0.88)
GFR, Est African American: 64 mL/min (ref >=60)
GFR, Est Non African American: 56 mL/min — ABNORMAL LOW (ref >=60)
GLUCOSE: 82 mg/dL (ref 70–99)
Potassium: 4.5 mmol/L (ref 3.5–5.3)
Sodium: 140 mmol/L (ref 135–146)
TOTAL PROTEIN: 6.8 g/dL (ref 6.1–8.1)

## 2017-01-30 LAB — LIPID PANEL
Cholesterol: 156 mg/dL (ref <200)
HDL: 45 mg/dL — AB (ref >50)
LDL CALC: 73 mg/dL (ref <100)
Total CHOL/HDL Ratio: 3.5 Ratio (ref <5.0)
Triglycerides: 192 mg/dL — ABNORMAL HIGH (ref <150)
VLDL: 38 mg/dL — ABNORMAL HIGH (ref <30)

## 2017-01-30 LAB — TSH: TSH: 0.8 m[IU]/L

## 2017-01-31 ENCOUNTER — Other Ambulatory Visit: Payer: Self-pay | Admitting: Hematology & Oncology

## 2017-01-31 DIAGNOSIS — J45909 Unspecified asthma, uncomplicated: Secondary | ICD-10-CM

## 2017-02-06 ENCOUNTER — Telehealth: Payer: Self-pay | Admitting: Family Medicine

## 2017-02-06 NOTE — Telephone Encounter (Signed)
Hannah Galvan from [facility] called [facility] stating that she is seeing a trend in pt's bp going up and just wanted to let us know. On 01/26/17 her BP was 136/68 and days after that it has been 142/80,164/80 with rest it came down to 158/80. She is still c/o HA's but that is not a new problem.

## 2017-02-08 ENCOUNTER — Other Ambulatory Visit: Payer: Self-pay | Admitting: Family Medicine

## 2017-02-09 ENCOUNTER — Ambulatory Visit (INDEPENDENT_AMBULATORY_CARE_PROVIDER_SITE_OTHER): Payer: Medicare Other | Admitting: Family Medicine

## 2017-02-09 ENCOUNTER — Ambulatory Visit: Payer: Medicare Other | Admitting: Family Medicine

## 2017-02-09 VITALS — BP 136/70 | HR 88 | Resp 20

## 2017-02-09 DIAGNOSIS — F410 Panic disorder [episodic paroxysmal anxiety] without agoraphobia: Secondary | ICD-10-CM

## 2017-02-09 DIAGNOSIS — R079 Chest pain, unspecified: Secondary | ICD-10-CM

## 2017-02-09 MED ORDER — CLONAZEPAM 1 MG PO TABS: 1.0000 mg | ORAL_TABLET | Freq: Two times a day (BID) | ORAL | 2 refills | Status: DC | PRN

## 2017-02-09 NOTE — Telephone Encounter (Signed)
LRF 10/20/16 #60 + 2   LOV 01/27/17  OK refill?

## 2017-02-09 NOTE — Progress Notes (Signed)
Subjective:    Patient ID: Hannah Galvan, female    DOB: 02/11/1931, 83 y.o.   MRN: [id number]  HPI  01/27/17 Patient is here today with her family to discuss memory loss. They have seen several concerning behaviors including leaving on the stove, forgetting how to use household appliances that she has previously used, forgetting conversations. The patient has to be repeatedly told things and forgets what she has been told.  Symptoms have become gradually apparent over the last few month.  She also has occasional anomia.   I started by performing a Mini-Mental status exam on the patient.  She quickly says the date including the day of the month the season. However she becomes very confused on the year. Regarding location, she is unable to tell him the name of the town that the clinic is in. On rapid recall she can only remember 1 out of 3 objects. She scores 2 out of 5 in spelling world in reverse. I gave her the opportunity to perform serial sevens however she did just is poorly. The focal time copying intersecting pentagons. She is able to follow multistep commands.  She is unable to correctly draw all the time on the clock milligram. On the Montral cognitive assessment, the patient is unable to follow the pattern using letters and numbers. She scores 22 on a scale 30 on Mini-Mental status exam. At that time, my plan was:  I believe the patient is showing early signs of Alzheimer's disease. Begin Aricept 5 mg by mouth daily and increase to 10 mg by mouth daily in one month if the patient tolerates the medication. I would quickly add Namenda if tolerable. Also believe her dementia is complicated by severe depression and loneliness. I have recommended increasing her interaction with other people to stimulate her cognitively. Also recommended reading books, word searches, and other activities that stimulate her brain. I recommended regular exercise as a progression.  02/09/17 Patient walked in today  complaining of chest fluttering and shortness of breath. Her family is coming up today to pick her up to carry her to [place] to spend time with her sister. She's been very anxious regarding this. She has been battling anxiety for quite some time. The patient appears extremely anxious on her exam today. However her heart is beating regularly between 80 and 90 bpm depending on when you check it. She denies any chest pain. She denies any chest pressure. She complains of her heart racing exactly when we performed the EKG which revealed no evidence of any cardiac arrhythmia.  It does show a stable left bundle branch block that has been present when compared to the 3 previous EKGs that she has. It also showed nonspecific ST changes in the lateral leads which are chronic. There were no acute changes in her EKG today. I sat and talked with the patient and her daughter-in-law for 15 minutes. The longer I talk, the more calm the patient became. Her tachycardia resolved, her tachypnea resolved. She looked less anxious. Her daughter-in-law also believes that she's having a panic attack. She has not yet tried one of her Klonopin. The patient has been very anxious about going to [place] to visit her sister. I believe that the arrival of the family this coming to take her triggered the panic attack. Past Medical History:  Diagnosis Date  . Allergy   . Anxiety   . Arthritis   . Blood transfusion without reported diagnosis   . Cancer ([place])  breast, s/p RXT, bilateral  . Cataract   . History of echocardiogram    a. Echo 8/16: Vigorous LVF, EF 12-24%, grade 1 diastolic dysfunction, trivial AI, mild MR, mild RVE, normal RV function, PASP 33 mmHg // b. Echo 1/17 EF 65-70%, normal wall motion, grade 1 diastolic dysfunction, trivial AI, MAC, mild MR // c. Echo 11/26/16: mild LVH, EF 60-65, mild AI, mild MR, mild LAE, mild to mod TR  . History of stress test    Lexiscan Myoview 8/16:  EF 81%, breast attenuation, no ischemia;  Low Risk // Myoview 11/26/16: EF 75, apical defect c/w soft tissue attenuation, no ischemia or scar; Low Risk  . Hyperlipidemia   . Hypertension   . Hypothyroidism   . Shortness of breath   . Thyroid disease    Past Surgical History:  Procedure Laterality Date  . ABDOMINAL HYSTERECTOMY  2008  . bladder tact    . BREAST LUMPECTOMY  2000  . BREAST LUMPECTOMY  2007  . CATARACT EXTRACTION  2011  . JOINT REPLACEMENT    . KNEE SURGERY     right  . ROTATOR CUFF REPAIR     right  . THYROID SURGERY  2002  . TOTAL KNEE ARTHROPLASTY Left 06/02/2013   Dr Rhona Raider  . TOTAL KNEE ARTHROPLASTY Right 06/02/2013   Procedure: RIGHT TOTAL KNEE ARTHROPLASTY;  Surgeon: Hessie Dibble, MD;  Location: [place];  Service: Orthopedics;  Laterality: Right;  . TOTAL KNEE ARTHROPLASTY Left 08/22/2014   Procedure: TOTAL KNEE ARTHROPLASTY;  Surgeon: Hessie Dibble, MD;  Location: [place];  Service: Orthopedics;  Laterality: Left;   Current Outpatient Prescriptions on File Prior to Visit  Medication Sig Dispense Refill  . aspirin 81 MG tablet Take 81 mg by mouth daily.    . Calcium Carbonate-Vitamin D (CALCIUM + D) 600-200 MG-UNIT TABS Take 1 tablet by mouth daily.     . COMBIVENT RESPIMAT 20-100 MCG/ACT AERS respimat INHALE 1 PUFF INTO THE LUGNS EVERY 6 HOURS AS NEEDED FOR WHEEZING 2 Inhaler 0  . Cranberry 250 MG CAPS Take 250 mg by mouth daily.    Marland Kitchen donepezil (ARICEPT) 5 MG tablet Take 1 tablet (5 mg total) by mouth at bedtime. 30 tablet 5  . exemestane (AROMASIN) 25 MG tablet TAKE 1 TABLET EVERY DAY 90 tablet 2  . folic acid (FOLVITE) 1 MG tablet Take 1 tablet (1 mg total) by mouth daily. 90 tablet 1  . furosemide (LASIX) 20 MG tablet Take 1 tablet (20 mg total) by mouth daily. Can take 1 pill a day as needed for swelling 30 tablet 3  . KLOR-CON M20 20 MEQ tablet TAKE 1 TABLET BY MOUTH DAILY 90 tablet 3  . levothyroxine (SYNTHROID) 125 MCG tablet Take 1 tablet (125 mcg total) by mouth daily. 90 tablet 1  .  losartan (COZAAR) 50 MG tablet Take 1 tablet (50 mg total) by mouth daily. 90 tablet 4  . Multiple Vitamins-Minerals (CENTRUM SILVER ADULT 50+ PO) Take 1 tablet by mouth daily.     . nitroGLYCERIN (NITROSTAT) 0.4 MG SL tablet Place 1 tablet (0.4 mg total) under the tongue every 5 (five) minutes as needed for chest pain. 25 tablet 3  . nystatin ointment (MYCOSTATIN) Apply 1 application topically daily.    . pantoprazole (PROTONIX) 40 MG tablet Take 1 tablet (40 mg total) by mouth as directed. 40 MG DAILY FOR 1 MONTH THEN AS NEEDED 90 tablet 3  . rosuvastatin (CRESTOR) 10 MG tablet Take 1  tablet (10 mg total) by mouth daily at 6 PM. 90 tablet 4  . sertraline (ZOLOFT) 50 MG tablet Take 1 tablet (50 mg total) by mouth at bedtime. 30 tablet 5  . traZODone (DESYREL) 50 MG tablet Take 1 tablet (50 mg total) by mouth at bedtime as needed for sleep. 30 tablet 3   No current facility-administered medications on file prior to visit.    Allergies  Allergen Reactions  . Ambien [Zolpidem]     confusion  . Pyridium [Phenazopyridine Hcl] Other (See Comments)    hemolysis  . Sulfa Antibiotics Rash   Social History   Social History  . Marital status: Widowed    Spouse name: N/A  . Number of children: N/A  . Years of education: N/A   Occupational History  . Not on file.   Social History Main Topics  . Smoking status: Never Smoker  . Smokeless tobacco: Never Used     Comment: never used tobacco  . Alcohol use No  . Drug use: No  . Sexual activity: Not Currently   Other Topics Concern  . Not on file   Social History Narrative  . No narrative on file   Family History  Problem Relation Age of Onset  . Hypertension Mother   . Stroke Mother   . Cancer Father   . Cancer Sister     leukemia  . Cancer Brother     leukemia  . Cancer Sister     vaginal  . Cancer Sister     bladder  . Cancer Brother     prostate, skin  . Cancer Brother     colon  . Cancer Brother     prostate, bone  mets  . Cancer Brother     skin  . Heart disease Sister       Review of Systems  All other systems reviewed and are negative.      Objective:   Physical Exam  Constitutional: She is oriented to person, place, and time. She appears well-developed and well-nourished. No distress.  HENT:  Head: Normocephalic and atraumatic.  Right Ear: External ear normal.  Left Ear: External ear normal.  Nose: Nose normal.  Mouth/Throat: Oropharynx is clear and moist. No oropharyngeal exudate.  Eyes: Conjunctivae and EOM are normal. Pupils are equal, round, and reactive to light. Right eye exhibits no discharge. Left eye exhibits no discharge. No scleral icterus.  Neck: Normal range of motion. Neck supple. No thyromegaly present.  Cardiovascular: Normal rate and regular rhythm.   Murmur heard. Pulmonary/Chest: Effort normal and breath sounds normal. No respiratory distress. She has no wheezes. She has no rales.  Abdominal: Soft. Bowel sounds are normal.  Musculoskeletal: Normal range of motion.  Neurological: She is alert and oriented to person, place, and time. She has normal reflexes. No cranial nerve deficit. She exhibits normal muscle tone. Coordination normal.  Skin: She is not diaphoretic.  Psychiatric: Her speech is normal and behavior is normal. Judgment and thought content normal. Her mood appears anxious. She exhibits a depressed mood.  Vitals reviewed.         Assessment & Plan:  Panic attack Although the patient is 83 years old with significant risk factors, I feel that the patient's having a panic attack. The longer that I sat and talked with her, the more calm she became and the "feeling of her heart racing" subsided. She was smiling by the end of our encounter. I recommended that she go home with  her daughter-in-law and take her Klonopin which I believe will help her relax. She can then be monitored at home by her family. Should her symptoms worsen she can always call 911  immediately at the present time I truly feel this is an anxiety attack and no further workup is necessary at this point. She just saw cardiology in December and workup was negative

## 2017-02-09 NOTE — Telephone Encounter (Signed)
Pt walked in and was seen in office today

## 2017-02-09 NOTE — Telephone Encounter (Signed)
Lets watch for now and if persistently 150 or higher, add medication

## 2017-02-09 NOTE — Telephone Encounter (Signed)
ok 

## 2017-03-02 ENCOUNTER — Telehealth: Payer: Self-pay | Admitting: Family Medicine

## 2017-03-02 NOTE — Telephone Encounter (Signed)
Please advise 

## 2017-03-02 NOTE — Telephone Encounter (Signed)
[name] patients son calling to say that he would like a letter for whom it may concern  saying that his mom has early dementia  and that she is being treated with meds for this, and also has has a history of panic attacks and anxiety  [phone] lease call him with any questions or when letter is ready

## 2017-03-03 ENCOUNTER — Encounter: Payer: Self-pay | Admitting: Family Medicine

## 2017-03-03 NOTE — Telephone Encounter (Signed)
Lovey Newcomer, please print the letter I wrote today.  I will sign on Thursday.  My computer will not print any letters for some reason.  Please explain to [name] that I did not include any discussion of depression or anxiety without patient's request as this is a privacy issue and I do not see the relevance for including this in a public letter as it will not influence any legal (POA) issues.  If an attorney needs to know that for some reason, they can call me.

## 2017-03-04 NOTE — Telephone Encounter (Signed)
Letter printed and placed on MD's desk - called and spoke to [name] and informed of letter and he will pick up either tomorrow or Friday.

## 2017-03-14 ENCOUNTER — Other Ambulatory Visit: Payer: Self-pay | Admitting: Family Medicine

## 2017-03-18 ENCOUNTER — Other Ambulatory Visit: Payer: Self-pay | Admitting: Family Medicine

## 2017-03-19 ENCOUNTER — Other Ambulatory Visit: Payer: Self-pay | Admitting: *Deleted

## 2017-03-19 DIAGNOSIS — F411 Generalized anxiety disorder: Secondary | ICD-10-CM

## 2017-03-19 MED ORDER — TRAZODONE HCL 50 MG PO TABS: 50.0000 mg | ORAL_TABLET | Freq: Every evening | ORAL | 3 refills | Status: DC | PRN

## 2017-03-19 MED ORDER — SERTRALINE HCL 50 MG PO TABS: 50.0000 mg | ORAL_TABLET | Freq: Every day | ORAL | 5 refills | Status: DC

## 2017-03-23 ENCOUNTER — Ambulatory Visit (INDEPENDENT_AMBULATORY_CARE_PROVIDER_SITE_OTHER): Payer: Medicare Other | Admitting: Family Medicine

## 2017-03-23 ENCOUNTER — Encounter: Payer: Self-pay | Admitting: Family Medicine

## 2017-03-23 VITALS — BP 146/76 | HR 80 | Temp 98.2°F | Resp 20 | Ht 62.5 in | Wt 156.0 lb

## 2017-03-23 DIAGNOSIS — Z09 Encounter for follow-up examination after completed treatment for conditions other than malignant neoplasm: Secondary | ICD-10-CM

## 2017-03-23 DIAGNOSIS — F0280 Dementia in other diseases classified elsewhere without behavioral disturbance: Secondary | ICD-10-CM

## 2017-03-23 DIAGNOSIS — W19XXXA Unspecified fall, initial encounter: Secondary | ICD-10-CM | POA: Diagnosis not present

## 2017-03-23 DIAGNOSIS — G309 Alzheimer's disease, unspecified: Secondary | ICD-10-CM | POA: Diagnosis not present

## 2017-03-23 NOTE — Progress Notes (Signed)
Subjective:    Patient ID: Hannah Galvan, female    DOB: 1931-09-12, 83 y.o.   MRN: [id number]  HPI  01/27/17 Patient is here today with her family to discuss memory loss. They have seen several concerning behaviors including leaving on the stove, forgetting how to use household appliances that she has previously used, forgetting conversations. The patient has to be repeatedly told things and forgets what she has been told.  Symptoms have become gradually apparent over the last few month.  She also has occasional anomia.   I started by performing a Mini-Mental status exam on the patient.  She quickly says the date including the day of the month the season. However she becomes very confused on the year. Regarding location, she is unable to tell him the name of the town that the clinic is in. On rapid recall she can only remember 1 out of 3 objects. She scores 2 out of 5 in spelling world in reverse. I gave her the opportunity to perform serial sevens however she did just is poorly. The focal time copying intersecting pentagons. She is able to follow multistep commands.  She is unable to correctly draw all the time on the clock milligram. On the Montral cognitive assessment, the patient is unable to follow the pattern using letters and numbers. She scores 22 on a scale 30 on Mini-Mental status exam.  At that time, my plan was: I believe the patient is showing early signs of Alzheimer's disease. Begin Aricept 5 mg by mouth daily and increase to 10 mg by mouth daily in one month if the patient tolerates the medication. I would quickly add Namenda if tolerable. Also believe her dementia is complicated by severe depression and loneliness. I have recommended increasing her interaction with other people to stimulate her cognitively. Also recommended reading books, word searches, and other activities that stimulate her brain. I recommended regular exercise as a progression.  03/23/17 Patient went to [place] and had  a wonderful time. However while she was in [place], she fell at a restaurant walking in the parking lot and landed directly on asphalt striking the chest and face and both knees. She has a yellow bruise around her left eye, a bruise above her right orbit, 2 bruises on both knees, and a yellowing bruise over her sternum. Fortunately she went to the hospital and x-rays and CAT scans were negative for any intracranial hemorrhage or broken bone. Overall she is doing very well after the fall. She denies any significant headaches, dizziness, loss of consciousness, vision changes, or vomiting. She denies any pleurisy or shortness of breath. Her blood pressure at home has been averaging between 140 and 180/70-80. She denies any chest pain or shortness of breath. She is here today with her son. I again encouraged the patient to ambulate with a walker. I have also again encouraged her to look into assisted living facilities as I believe it's a matter of time before she falls again and I am very concerned her next fall may result in a serious injury. Past Medical History:  Diagnosis Date  . Allergy   . Anxiety   . Arthritis   . Blood transfusion without reported diagnosis   . Cancer ([facility])    breast, s/p RXT, bilateral  . Cataract   . History of echocardiogram    a. Echo 8/16: Vigorous LVF, EF 70-62%, grade 1 diastolic dysfunction, trivial AI, mild MR, mild RVE, normal RV function, PASP 33 mmHg // b.  Echo 1/17 EF 65-70%, normal wall motion, grade 1 diastolic dysfunction, trivial AI, MAC, mild MR // c. Echo 11/26/16: mild LVH, EF 60-65, mild AI, mild MR, mild LAE, mild to mod TR  . History of stress test    Lexiscan Myoview 8/16:  EF 81%, breast attenuation, no ischemia; Low Risk // Myoview 11/26/16: EF 75, apical defect c/w soft tissue attenuation, no ischemia or scar; Low Risk  . Hyperlipidemia   . Hypertension   . Hypothyroidism   . Shortness of breath   . Thyroid disease    Past Surgical History:  Procedure  Laterality Date  . ABDOMINAL HYSTERECTOMY  2008  . bladder tact    . BREAST LUMPECTOMY  2000  . BREAST LUMPECTOMY  2007  . CATARACT EXTRACTION  2011  . JOINT REPLACEMENT    . KNEE SURGERY     right  . ROTATOR CUFF REPAIR     right  . THYROID SURGERY  2002  . TOTAL KNEE ARTHROPLASTY Left 06/02/2013   Dr Rhona Raider  . TOTAL KNEE ARTHROPLASTY Right 06/02/2013   Procedure: RIGHT TOTAL KNEE ARTHROPLASTY;  Surgeon: Hessie Dibble, MD;  Location: [facility] [place];  Service: Orthopedics;  Laterality: Right;  . TOTAL KNEE ARTHROPLASTY Left 08/22/2014   Procedure: TOTAL KNEE ARTHROPLASTY;  Surgeon: Hessie Dibble, MD;  Location: [place];  Service: Orthopedics;  Laterality: Left;   Current Outpatient Prescriptions on File Prior to Visit  Medication Sig Dispense Refill  . aspirin 81 MG tablet Take 81 mg by mouth daily.    . Calcium Carbonate-Vitamin D (CALCIUM + D) 600-200 MG-UNIT TABS Take 1 tablet by mouth daily.     . clonazePAM (KLONOPIN) 1 MG tablet Take 1 tablet (1 mg total) by mouth 2 (two) times daily as needed. 60 tablet 2  . COMBIVENT RESPIMAT 20-100 MCG/ACT AERS respimat INHALE 1 PUFF INTO THE LUGNS EVERY 6 HOURS AS NEEDED FOR WHEEZING 2 Inhaler 0  . Cranberry 250 MG CAPS Take 250 mg by mouth daily.    Marland Kitchen donepezil (ARICEPT) 5 MG tablet Take 1 tablet (5 mg total) by mouth at bedtime. 30 tablet 5  . exemestane (AROMASIN) 25 MG tablet TAKE 1 TABLET EVERY DAY 90 tablet 2  . folic acid (FOLVITE) 1 MG tablet TAKE 1 TABLET BY MOUTH EVERY DAY 90 tablet 3  . furosemide (LASIX) 20 MG tablet Take 1 tablet (20 mg total) by mouth daily. Can take 1 pill a day as needed for swelling 30 tablet 3  . KLOR-CON M20 20 MEQ tablet TAKE 1 TABLET BY MOUTH DAILY 90 tablet 3  . levothyroxine (SYNTHROID) 125 MCG tablet Take 1 tablet (125 mcg total) by mouth daily. 90 tablet 1  . losartan (COZAAR) 50 MG tablet Take 1 tablet (50 mg total) by mouth daily. 90 tablet 4  . Multiple Vitamins-Minerals (CENTRUM SILVER ADULT 50+ PO)  Take 1 tablet by mouth daily.     . nitroGLYCERIN (NITROSTAT) 0.4 MG SL tablet Place 1 tablet (0.4 mg total) under the tongue every 5 (five) minutes as needed for chest pain. 25 tablet 3  . nystatin ointment (MYCOSTATIN) Apply 1 application topically daily.    . pantoprazole (PROTONIX) 40 MG tablet Take 1 tablet (40 mg total) by mouth as directed. 40 MG DAILY FOR 1 MONTH THEN AS NEEDED 90 tablet 3  . rosuvastatin (CRESTOR) 10 MG tablet Take 1 tablet (10 mg total) by mouth daily at 6 PM. 90 tablet 4  . sertraline (ZOLOFT) 50 MG  tablet Take 1 tablet (50 mg total) by mouth at bedtime. (Patient taking differently: Take 50 mg by mouth every other day. ) 90 tablet 5  . traZODone (DESYREL) 50 MG tablet Take 1 tablet (50 mg total) by mouth at bedtime as needed for sleep. 90 tablet 3   No current facility-administered medications on file prior to visit.    Allergies  Allergen Reactions  . Ambien [Zolpidem]     confusion  . Pyridium [Phenazopyridine Hcl] Other (See Comments)    hemolysis  . Sulfa Antibiotics Rash   Social History   Social History  . Marital status: Widowed    Spouse name: N/A  . Number of children: N/A  . Years of education: N/A   Occupational History  . Not on file.   Social History Main Topics  . Smoking status: Never Smoker  . Smokeless tobacco: Never Used     Comment: never used tobacco  . Alcohol use No  . Drug use: No  . Sexual activity: Not Currently   Other Topics Concern  . Not on file   Social History Narrative  . No narrative on file   Family History  Problem Relation Age of Onset  . Hypertension Mother   . Stroke Mother   . Cancer Father   . Cancer Sister     leukemia  . Cancer Brother     leukemia  . Cancer Sister     vaginal  . Cancer Sister     bladder  . Cancer Brother     prostate, skin  . Cancer Brother     colon  . Cancer Brother     prostate, bone mets  . Cancer Brother     skin  . Heart disease Sister       Review of  Systems  All other systems reviewed and are negative.      Objective:   Physical Exam  Constitutional: She is oriented to person, place, and time. She appears well-developed and well-nourished. No distress.  HENT:  Head: Normocephalic and atraumatic.  Right Ear: External ear normal.  Left Ear: External ear normal.  Nose: Nose normal.  Mouth/Throat: Oropharynx is clear and moist. No oropharyngeal exudate.  Eyes: Conjunctivae and EOM are normal. Pupils are equal, round, and reactive to light. Right eye exhibits no discharge. Left eye exhibits no discharge. No scleral icterus.  Neck: Normal range of motion. Neck supple. No thyromegaly present.  Cardiovascular: Normal rate, regular rhythm and normal heart sounds.   Pulmonary/Chest: Effort normal and breath sounds normal. No respiratory distress. She has no wheezes. She has no rales.  Abdominal: Soft. Bowel sounds are normal.  Musculoskeletal: Normal range of motion.  Neurological: She is alert and oriented to person, place, and time. She has normal reflexes. No cranial nerve deficit. She exhibits normal muscle tone. Coordination normal.  Skin: She is not diaphoretic.  Psychiatric: Her speech is normal and behavior is normal. Judgment and thought content normal. She exhibits a depressed mood.  Vitals reviewed. Bruising above the right orbit, around the left eye, on both knees, and on her sternum        Assessment & Plan:  Fall, initial encounter  Alzheimer's dementia without behavioral disturbance, unspecified timing of dementia onset  Hospital discharge follow-up  Decrease use of Klonopin to prevent future falls. Continue work with outpatient physical therapy. Ambulate with a walker at all times. Increase losartan to 50 mg twice a day to address her elevated blood pressure, I  recommended that she and her son visit assisted living facilities so that she can tour them and see if she can file one that she would like.

## 2017-03-27 ENCOUNTER — Telehealth: Payer: Self-pay | Admitting: Family Medicine

## 2017-03-27 NOTE — Telephone Encounter (Signed)
Pt's son called and states that he carried pt to ortho and her foot was broken on top. He is not upset he just wanted to let you know cause they told her in [place] that it was not.

## 2017-04-14 ENCOUNTER — Encounter: Payer: Self-pay | Admitting: Family Medicine

## 2017-04-21 ENCOUNTER — Other Ambulatory Visit: Payer: Self-pay | Admitting: Family Medicine

## 2017-04-21 DIAGNOSIS — I10 Essential (primary) hypertension: Secondary | ICD-10-CM

## 2017-04-24 ENCOUNTER — Other Ambulatory Visit: Payer: Self-pay | Admitting: Family Medicine

## 2017-04-24 NOTE — Telephone Encounter (Signed)
Refill appropriate 

## 2017-05-21 ENCOUNTER — Other Ambulatory Visit: Payer: Self-pay | Admitting: Family Medicine

## 2017-05-21 DIAGNOSIS — I10 Essential (primary) hypertension: Secondary | ICD-10-CM

## 2017-05-21 MED ORDER — LOSARTAN POTASSIUM 50 MG PO TABS: 50.0000 mg | ORAL_TABLET | Freq: Every day | ORAL | 2 refills | Status: DC

## 2017-06-12 ENCOUNTER — Other Ambulatory Visit: Payer: Self-pay | Admitting: Family Medicine

## 2017-06-12 NOTE — Telephone Encounter (Signed)
ok 

## 2017-06-12 NOTE — Telephone Encounter (Signed)
Ok to refill Klonopin?

## 2017-06-15 ENCOUNTER — Ambulatory Visit: Payer: Medicare Other | Admitting: Hematology & Oncology

## 2017-06-15 ENCOUNTER — Other Ambulatory Visit: Payer: Medicare Other

## 2017-06-16 ENCOUNTER — Telehealth: Payer: Self-pay | Admitting: *Deleted

## 2017-06-16 NOTE — Telephone Encounter (Signed)
[facility] & patient called concerning BP during PT today.  Readings were 176/86 & 183/88. Spoke with Dr Dennard Schaumann & he instructed:  to to take 1 extra Losartan & come in tomorrow for a BP Check.

## 2017-06-17 ENCOUNTER — Ambulatory Visit: Payer: Medicare Other

## 2017-06-17 VITALS — BP 160/78

## 2017-06-17 DIAGNOSIS — I10 Essential (primary) hypertension: Secondary | ICD-10-CM

## 2017-06-18 ENCOUNTER — Other Ambulatory Visit: Payer: Self-pay

## 2017-06-18 DIAGNOSIS — I10 Essential (primary) hypertension: Secondary | ICD-10-CM

## 2017-06-18 MED ORDER — LOSARTAN POTASSIUM 100 MG PO TABS: 100.0000 mg | ORAL_TABLET | Freq: Every day | ORAL | 1 refills | Status: DC

## 2017-06-23 ENCOUNTER — Encounter: Payer: Self-pay | Admitting: Family Medicine

## 2017-06-23 ENCOUNTER — Ambulatory Visit (INDEPENDENT_AMBULATORY_CARE_PROVIDER_SITE_OTHER): Payer: Medicare Other | Admitting: Family Medicine

## 2017-06-23 VITALS — BP 160/84 | HR 84 | Temp 98.4°F | Resp 20 | Ht 62.5 in | Wt 158.0 lb

## 2017-06-23 DIAGNOSIS — I10 Essential (primary) hypertension: Secondary | ICD-10-CM

## 2017-06-23 MED ORDER — AMLODIPINE BESYLATE 5 MG PO TABS: 5.0000 mg | ORAL_TABLET | Freq: Every day | ORAL | 3 refills | Status: DC

## 2017-06-23 NOTE — Progress Notes (Signed)
Subjective:    Patient ID: Hannah Galvan, female    DOB: [birth year], 83 y.o.   MRN: [id number]  HPI Patient has a history of hypertension. Last week she stop by and was seen by my nurse and her blood pressure was found to be elevated. At that point we increase losartan to 100 mg a day. Her blood pressure has consistently been running 150-160/80-90 thereafter. She does report some shortness of breath. She denies any chest pain. Shortness of breath is only with strenuous exertion. She denies any dyspnea at rest. Echocardiogram obtained earlier this year revealed mild to moderate tricuspid regurgitation but no other significant valvular abnormalities even though she has a very loud 3/6 systolic ejection murmur. Past Medical History:  Diagnosis Date  . Allergy   . Anxiety   . Arthritis   . Blood transfusion without reported diagnosis   . Cancer ([place])    breast, s/p RXT, bilateral  . Cataract   . History of echocardiogram    a. Echo 8/16: Vigorous LVF, EF 39-76%, grade 1 diastolic dysfunction, trivial AI, mild MR, mild RVE, normal RV function, PASP 33 mmHg // b. Echo 1/17 EF 65-70%, normal wall motion, grade 1 diastolic dysfunction, trivial AI, MAC, mild MR // c. Echo 11/26/16: mild LVH, EF 60-65, mild AI, mild MR, mild LAE, mild to mod TR  . History of stress test    Lexiscan Myoview 8/16:  EF 81%, breast attenuation, no ischemia; Low Risk // Myoview 11/26/16: EF 75, apical defect c/w soft tissue attenuation, no ischemia or scar; Low Risk  . Hyperlipidemia   . Hypertension   . Hypothyroidism   . Shortness of breath   . Thyroid disease    Past Surgical History:  Procedure Laterality Date  . ABDOMINAL HYSTERECTOMY  2008  . bladder tact    . BREAST LUMPECTOMY  2000  . BREAST LUMPECTOMY  2007  . CATARACT EXTRACTION  2011  . JOINT REPLACEMENT    . KNEE SURGERY     right  . ROTATOR CUFF REPAIR     right  . THYROID SURGERY  2002  . TOTAL KNEE ARTHROPLASTY Left 06/02/2013   Dr Rhona Raider  .  TOTAL KNEE ARTHROPLASTY Right 06/02/2013   Procedure: RIGHT TOTAL KNEE ARTHROPLASTY;  Surgeon: Hessie Dibble, MD;  Location: [facility];  Service: Orthopedics;  Laterality: Right;  . TOTAL KNEE ARTHROPLASTY Left 08/22/2014   Procedure: TOTAL KNEE ARTHROPLASTY;  Surgeon: Hessie Dibble, MD;  Location: [facility];  Service: Orthopedics;  Laterality: Left;   Current Outpatient Prescriptions on File Prior to Visit  Medication Sig Dispense Refill  . aspirin 81 MG tablet Take 81 mg by mouth daily.    . Calcium Carbonate-Vitamin D (CALCIUM + D) 600-200 MG-UNIT TABS Take 1 tablet by mouth daily.     . clonazePAM (KLONOPIN) 1 MG tablet TAKE 1 TABLET TWICE A DAY AS NEEDED 60 tablet 2  . COMBIVENT RESPIMAT 20-100 MCG/ACT AERS respimat INHALE 1 PUFF INTO THE LUGNS EVERY 6 HOURS AS NEEDED FOR WHEEZING 2 Inhaler 0  . Cranberry 250 MG CAPS Take 250 mg by mouth daily.    Marland Kitchen donepezil (ARICEPT) 5 MG tablet TAKE 1 TABLET (5 MG TOTAL) BY MOUTH AT BEDTIME. 30 tablet 5  . exemestane (AROMASIN) 25 MG tablet TAKE 1 TABLET EVERY DAY 90 tablet 2  . folic acid (FOLVITE) 1 MG tablet TAKE 1 TABLET BY MOUTH EVERY DAY 90 tablet 3  . furosemide (LASIX) 20 MG tablet Take  1 tablet (20 mg total) by mouth daily. Can take 1 pill a day as needed for swelling 30 tablet 3  . KLOR-CON M20 20 MEQ tablet TAKE 1 TABLET BY MOUTH DAILY 90 tablet 3  . levothyroxine (SYNTHROID, LEVOTHROID) 125 MCG tablet TAKE 1 TABLET BY MOUTH ONCE A DAY 90 tablet 2  . losartan (COZAAR) 100 MG tablet Take 1 tablet (100 mg total) by mouth daily. 30 tablet 1  . Multiple Vitamins-Minerals (CENTRUM SILVER ADULT 50+ PO) Take 1 tablet by mouth daily.     . nitroGLYCERIN (NITROSTAT) 0.4 MG SL tablet Place 1 tablet (0.4 mg total) under the tongue every 5 (five) minutes as needed for chest pain. 25 tablet 3  . nystatin ointment (MYCOSTATIN) Apply 1 application topically daily.    . pantoprazole (PROTONIX) 40 MG tablet Take 1 tablet (40 mg total) by mouth as directed. 40 MG  DAILY FOR 1 MONTH THEN AS NEEDED 90 tablet 3  . rosuvastatin (CRESTOR) 10 MG tablet TAKE 1 TABLET BY MOUTH EVERY DAY AT 6PM 90 tablet 0  . sertraline (ZOLOFT) 50 MG tablet Take 1 tablet (50 mg total) by mouth at bedtime. (Patient taking differently: Take 50 mg by mouth every other day. ) 90 tablet 5  . traZODone (DESYREL) 50 MG tablet Take 1 tablet (50 mg total) by mouth at bedtime as needed for sleep. 90 tablet 3   No current facility-administered medications on file prior to visit.    Allergies  Allergen Reactions  . Ambien [Zolpidem]     confusion  . Pyridium [Phenazopyridine Hcl] Other (See Comments)    hemolysis  . Sulfa Antibiotics Rash   Social History   Social History  . Marital status: Widowed    Spouse name: N/A  . Number of children: N/A  . Years of education: N/A   Occupational History  . Not on file.   Social History Main Topics  . Smoking status: Never Smoker  . Smokeless tobacco: Never Used     Comment: never used tobacco  . Alcohol use No  . Drug use: No  . Sexual activity: Not Currently   Other Topics Concern  . Not on file   Social History Narrative  . No narrative on file       Review of Systems  All other systems reviewed and are negative.      Objective:   Physical Exam  Neck: Neck supple. No JVD present.  Cardiovascular: Normal rate and regular rhythm.   Murmur heard. Pulmonary/Chest: Effort normal and breath sounds normal. No respiratory distress. She has no wheezes. She has no rales.  Musculoskeletal: She exhibits no edema.  Vitals reviewed.         Assessment & Plan:  Essential hypertension, benign Blood pressure remains too high. Continue losartan 100 mg a day and add amlodipine 5 mg by mouth daily and recheck blood pressure in 2-3 weeks. She also complains of insomnia. However she has mild dementia and I want to avoid any sedative medication. Therefore I recommended that she try over-the-counter melatonin.

## 2017-07-02 ENCOUNTER — Ambulatory Visit: Payer: Self-pay | Admitting: Family Medicine

## 2017-07-02 ENCOUNTER — Ambulatory Visit (INDEPENDENT_AMBULATORY_CARE_PROVIDER_SITE_OTHER): Payer: Medicare Other | Admitting: Family Medicine

## 2017-07-02 ENCOUNTER — Encounter: Payer: Self-pay | Admitting: Family Medicine

## 2017-07-02 VITALS — BP 160/78 | HR 80 | Temp 98.6°F | Resp 16 | Ht 62.5 in | Wt 157.0 lb

## 2017-07-02 DIAGNOSIS — Z1239 Encounter for other screening for malignant neoplasm of breast: Secondary | ICD-10-CM

## 2017-07-02 DIAGNOSIS — Z1231 Encounter for screening mammogram for malignant neoplasm of breast: Secondary | ICD-10-CM | POA: Diagnosis not present

## 2017-07-02 DIAGNOSIS — Z780 Asymptomatic menopausal state: Secondary | ICD-10-CM

## 2017-07-02 NOTE — Progress Notes (Signed)
Subjective:    Patient ID: Hannah Galvan, female    DOB: [birth year], 83 y.o.   MRN: [id number]  HPI  06/23/17 Patient has a history of hypertension. Last week she stop by and was seen by my nurse and her blood pressure was found to be elevated. At that point we increase losartan to 100 mg a day. Her blood pressure has consistently been running 150-160/80-90 thereafter. She does report some shortness of breath. She denies any chest pain. Shortness of breath is only with strenuous exertion. She denies any dyspnea at rest. Echocardiogram obtained earlier this year revealed mild to moderate tricuspid regurgitation but no other significant valvular abnormalities even though she has a very loud 3/6 systolic ejection murmur.  At that time, my plan was: Blood pressure remains too high. Continue losartan 100 mg a day and add amlodipine 5 mg by mouth daily and recheck blood pressure in 2-3 weeks. She also complains of insomnia. However she has mild dementia and I want to avoid any sedative medication. Therefore I recommended that she try over-the-counter melatonin.  07/02/17 Patient is here today for follow-up. She seems very anxious when I walk in the room. Her blood pressure is 160/78. I spoke with the patient for about 10 minutes and then recheck her blood pressure. The repeat reading was 146/70. I do believe anxiety plays a large role in her blood pressure. She denies any chest pain. Today she denies any shortness of breath which is much improved from last time. She attributes some of the spike in her blood pressure to anxiety planning her sister's birthday. She's been compliant with losartan 100 mg a day and amlodipine 5 mg a day. There is very minimal swelling in her legs. She does have some swelling in the dorsum of her right foot where she broke a bone. Past Medical History:  Diagnosis Date  . Allergy   . Anxiety   . Arthritis   . Blood transfusion without reported diagnosis   . Cancer ([place])    breast, s/p RXT, bilateral  . Cataract   . History of echocardiogram    a. Echo 8/16: Vigorous LVF, EF 55-73%, grade 1 diastolic dysfunction, trivial AI, mild MR, mild RVE, normal RV function, PASP 33 mmHg // b. Echo 1/17 EF 65-70%, normal wall motion, grade 1 diastolic dysfunction, trivial AI, MAC, mild MR // c. Echo 11/26/16: mild LVH, EF 60-65, mild AI, mild MR, mild LAE, mild to mod TR  . History of stress test    Lexiscan Myoview 8/16:  EF 81%, breast attenuation, no ischemia; Low Risk // Myoview 11/26/16: EF 75, apical defect c/w soft tissue attenuation, no ischemia or scar; Low Risk  . Hyperlipidemia   . Hypertension   . Hypothyroidism   . Shortness of breath   . Thyroid disease    Past Surgical History:  Procedure Laterality Date  . ABDOMINAL HYSTERECTOMY  2008  . bladder tact    . BREAST LUMPECTOMY  2000  . BREAST LUMPECTOMY  2007  . CATARACT EXTRACTION  2011  . JOINT REPLACEMENT    . KNEE SURGERY     right  . ROTATOR CUFF REPAIR     right  . THYROID SURGERY  2002  . TOTAL KNEE ARTHROPLASTY Left 06/02/2013   Dr Rhona Raider  . TOTAL KNEE ARTHROPLASTY Right 06/02/2013   Procedure: RIGHT TOTAL KNEE ARTHROPLASTY;  Surgeon: Hessie Dibble, MD;  Location: [facility];  Service: Orthopedics;  Laterality: Right;  . TOTAL KNEE  ARTHROPLASTY Left 08/22/2014   Procedure: TOTAL KNEE ARTHROPLASTY;  Surgeon: Hessie Dibble, MD;  Location: [facility];  Service: Orthopedics;  Laterality: Left;   Current Outpatient Prescriptions on File Prior to Visit  Medication Sig Dispense Refill  . amLODipine (NORVASC) 5 MG tablet Take 1 tablet (5 mg total) by mouth daily. 90 tablet 3  . aspirin 81 MG tablet Take 81 mg by mouth daily.    . Calcium Carbonate-Vitamin D (CALCIUM + D) 600-200 MG-UNIT TABS Take 1 tablet by mouth daily.     . clonazePAM (KLONOPIN) 1 MG tablet TAKE 1 TABLET TWICE A DAY AS NEEDED 60 tablet 2  . COMBIVENT RESPIMAT 20-100 MCG/ACT AERS respimat INHALE 1 PUFF INTO THE LUGNS EVERY 6 HOURS AS  NEEDED FOR WHEEZING 2 Inhaler 0  . Cranberry 250 MG CAPS Take 250 mg by mouth daily.    Marland Kitchen donepezil (ARICEPT) 5 MG tablet TAKE 1 TABLET (5 MG TOTAL) BY MOUTH AT BEDTIME. 30 tablet 5  . exemestane (AROMASIN) 25 MG tablet TAKE 1 TABLET EVERY DAY 90 tablet 2  . folic acid (FOLVITE) 1 MG tablet TAKE 1 TABLET BY MOUTH EVERY DAY 90 tablet 3  . furosemide (LASIX) 20 MG tablet Take 1 tablet (20 mg total) by mouth daily. Can take 1 pill a day as needed for swelling 30 tablet 3  . KLOR-CON M20 20 MEQ tablet TAKE 1 TABLET BY MOUTH DAILY 90 tablet 3  . levothyroxine (SYNTHROID, LEVOTHROID) 125 MCG tablet TAKE 1 TABLET BY MOUTH ONCE A DAY 90 tablet 2  . losartan (COZAAR) 100 MG tablet Take 1 tablet (100 mg total) by mouth daily. 30 tablet 1  . Multiple Vitamins-Minerals (CENTRUM SILVER ADULT 50+ PO) Take 1 tablet by mouth daily.     . nitroGLYCERIN (NITROSTAT) 0.4 MG SL tablet Place 1 tablet (0.4 mg total) under the tongue every 5 (five) minutes as needed for chest pain. 25 tablet 3  . nystatin ointment (MYCOSTATIN) Apply 1 application topically daily.    . pantoprazole (PROTONIX) 40 MG tablet Take 1 tablet (40 mg total) by mouth as directed. 40 MG DAILY FOR 1 MONTH THEN AS NEEDED 90 tablet 3  . rosuvastatin (CRESTOR) 10 MG tablet TAKE 1 TABLET BY MOUTH EVERY DAY AT 6PM 90 tablet 0  . sertraline (ZOLOFT) 50 MG tablet Take 1 tablet (50 mg total) by mouth at bedtime. (Patient taking differently: Take 50 mg by mouth every other day. ) 90 tablet 5  . traZODone (DESYREL) 50 MG tablet Take 1 tablet (50 mg total) by mouth at bedtime as needed for sleep. 90 tablet 3   No current facility-administered medications on file prior to visit.    Allergies  Allergen Reactions  . Ambien [Zolpidem]     confusion  . Pyridium [Phenazopyridine Hcl] Other (See Comments)    hemolysis  . Sulfa Antibiotics Rash   Social History   Social History  . Marital status: Widowed    Spouse name: N/A  . Number of children: N/A  .  Years of education: N/A   Occupational History  . Not on file.   Social History Main Topics  . Smoking status: Never Smoker  . Smokeless tobacco: Never Used     Comment: never used tobacco  . Alcohol use No  . Drug use: No  . Sexual activity: Not Currently   Other Topics Concern  . Not on file   Social History Narrative  . No narrative on file  Review of Systems  All other systems reviewed and are negative.      Objective:   Physical Exam  Neck: Neck supple. No JVD present.  Cardiovascular: Normal rate and regular rhythm.   Murmur heard. Pulmonary/Chest: Effort normal and breath sounds normal. No respiratory distress. She has no wheezes. She has no rales.  Musculoskeletal: She exhibits no edema.  Vitals reviewed.         Assessment & Plan:  Ess htn Blood pressure is better after I rechecked it but still slightly elevated. I would increase amlodipine to 10 mg a day and monitor for peripheral edema. I've asked the patient to call me back with her blood pressures in one month. Given her recent fracture, I would also perform a bone density test. The patient is also requesting a mammogram. We had a discussion about whether this is reasonable given her advanced age and she elects to proceed with the mammograms I will be glad to schedule a

## 2017-07-07 ENCOUNTER — Telehealth: Payer: Self-pay

## 2017-07-07 NOTE — Telephone Encounter (Signed)
Patient states she has a head cold, cough and is stopped up . Patient states her symptoms started 8/13. Patient requested an antibiotic be called in I explained to patient she would need to be seen before we could call in a rx for her. Patient states she has started taking mucinex and if she does not find relief with that then she will sch an appt

## 2017-07-13 ENCOUNTER — Encounter: Payer: Self-pay | Admitting: Family Medicine

## 2017-07-13 ENCOUNTER — Ambulatory Visit (INDEPENDENT_AMBULATORY_CARE_PROVIDER_SITE_OTHER): Payer: Medicare Other | Admitting: Family Medicine

## 2017-07-13 VITALS — BP 150/68 | HR 72 | Temp 98.6°F | Resp 18 | Ht 62.5 in | Wt 156.0 lb

## 2017-07-13 DIAGNOSIS — J40 Bronchitis, not specified as acute or chronic: Secondary | ICD-10-CM | POA: Diagnosis not present

## 2017-07-13 MED ORDER — AZITHROMYCIN 250 MG PO TABS: ORAL_TABLET | ORAL | 0 refills | Status: DC

## 2017-07-13 NOTE — Progress Notes (Signed)
Subjective:    Patient ID: Hannah Galvan, female    DOB: 1931-04-22, 83 y.o.   MRN: [id number]  HPI Symptoms started last week. Symptoms consist of a cough productive of clear yellow sputum, worsening chest congestion, worsening cough. She denies any hemoptysis. She denies any fevers or chills. She does report some mild shortness of breath. She denies any rhinorrhea. She denies any sore throat. She believes she caught the infection from her sister who recently visited from [place] Past Medical History:  Diagnosis Date  . Allergy   . Anxiety   . Arthritis   . Blood transfusion without reported diagnosis   . Cancer ([place])    breast, s/p RXT, bilateral  . Cataract   . History of echocardiogram    a. Echo 8/16: Vigorous LVF, EF 41-28%, grade 1 diastolic dysfunction, trivial AI, mild MR, mild RVE, normal RV function, PASP 33 mmHg // b. Echo 1/17 EF 65-70%, normal wall motion, grade 1 diastolic dysfunction, trivial AI, MAC, mild MR // c. Echo 11/26/16: mild LVH, EF 60-65, mild AI, mild MR, mild LAE, mild to mod TR  . History of stress test    Lexiscan Myoview 8/16:  EF 81%, breast attenuation, no ischemia; Low Risk // Myoview 11/26/16: EF 75, apical defect c/w soft tissue attenuation, no ischemia or scar; Low Risk  . Hyperlipidemia   . Hypertension   . Hypothyroidism   . Shortness of breath   . Thyroid disease    Past Surgical History:  Procedure Laterality Date  . ABDOMINAL HYSTERECTOMY  2008  . bladder tact    . BREAST LUMPECTOMY  2000  . BREAST LUMPECTOMY  2007  . CATARACT EXTRACTION  2011  . JOINT REPLACEMENT    . KNEE SURGERY     right  . ROTATOR CUFF REPAIR     right  . THYROID SURGERY  2002  . TOTAL KNEE ARTHROPLASTY Left 06/02/2013   Dr Rhona Raider  . TOTAL KNEE ARTHROPLASTY Right 06/02/2013   Procedure: RIGHT TOTAL KNEE ARTHROPLASTY;  Surgeon: Hessie Dibble, MD;  Location: [facility];  Service: Orthopedics;  Laterality: Right;  . TOTAL KNEE ARTHROPLASTY Left 08/22/2014   Procedure: TOTAL KNEE ARTHROPLASTY;  Surgeon: Hessie Dibble, MD;  Location: [facility];  Service: Orthopedics;  Laterality: Left;   Current Outpatient Prescriptions on File Prior to Visit  Medication Sig Dispense Refill  . amLODipine (NORVASC) 5 MG tablet Take 1 tablet (5 mg total) by mouth daily. (Patient taking differently: Take 10 mg by mouth daily. ) 90 tablet 3  . aspirin 81 MG tablet Take 81 mg by mouth daily.    . Calcium Carbonate-Vitamin D (CALCIUM + D) 600-200 MG-UNIT TABS Take 1 tablet by mouth daily.     . clonazePAM (KLONOPIN) 1 MG tablet TAKE 1 TABLET TWICE A DAY AS NEEDED 60 tablet 2  . COMBIVENT RESPIMAT 20-100 MCG/ACT AERS respimat INHALE 1 PUFF INTO THE LUGNS EVERY 6 HOURS AS NEEDED FOR WHEEZING 2 Inhaler 0  . Cranberry 250 MG CAPS Take 250 mg by mouth daily.    Marland Kitchen donepezil (ARICEPT) 5 MG tablet TAKE 1 TABLET (5 MG TOTAL) BY MOUTH AT BEDTIME. 30 tablet 5  . exemestane (AROMASIN) 25 MG tablet TAKE 1 TABLET EVERY DAY 90 tablet 2  . folic acid (FOLVITE) 1 MG tablet TAKE 1 TABLET BY MOUTH EVERY DAY 90 tablet 3  . furosemide (LASIX) 20 MG tablet Take 1 tablet (20 mg total) by mouth daily. Can take 1 pill  a day as needed for swelling 30 tablet 3  . KLOR-CON M20 20 MEQ tablet TAKE 1 TABLET BY MOUTH DAILY 90 tablet 3  . levothyroxine (SYNTHROID, LEVOTHROID) 125 MCG tablet TAKE 1 TABLET BY MOUTH ONCE A DAY 90 tablet 2  . losartan (COZAAR) 100 MG tablet Take 1 tablet (100 mg total) by mouth daily. 30 tablet 1  . Multiple Vitamins-Minerals (CENTRUM SILVER ADULT 50+ PO) Take 1 tablet by mouth daily.     . nitroGLYCERIN (NITROSTAT) 0.4 MG SL tablet Place 1 tablet (0.4 mg total) under the tongue every 5 (five) minutes as needed for chest pain. 25 tablet 3  . nystatin ointment (MYCOSTATIN) Apply 1 application topically daily.    . pantoprazole (PROTONIX) 40 MG tablet Take 1 tablet (40 mg total) by mouth as directed. 40 MG DAILY FOR 1 MONTH THEN AS NEEDED 90 tablet 3  . rosuvastatin (CRESTOR) 10  MG tablet TAKE 1 TABLET BY MOUTH EVERY DAY AT 6PM 90 tablet 0  . sertraline (ZOLOFT) 50 MG tablet Take 1 tablet (50 mg total) by mouth at bedtime. (Patient taking differently: Take 50 mg by mouth every other day. ) 90 tablet 5  . traZODone (DESYREL) 50 MG tablet Take 1 tablet (50 mg total) by mouth at bedtime as needed for sleep. 90 tablet 3   No current facility-administered medications on file prior to visit.    Allergies  Allergen Reactions  . Ambien [Zolpidem]     confusion  . Pyridium [Phenazopyridine Hcl] Other (See Comments)    hemolysis  . Sulfa Antibiotics Rash   Social History   Social History  . Marital status: Widowed    Spouse name: N/A  . Number of children: N/A  . Years of education: N/A   Occupational History  . Not on file.   Social History Main Topics  . Smoking status: Never Smoker  . Smokeless tobacco: Never Used     Comment: never used tobacco  . Alcohol use No  . Drug use: No  . Sexual activity: Not Currently   Other Topics Concern  . Not on file   Social History Narrative  . No narrative on file       Review of Systems  All other systems reviewed and are negative.      Objective:   Physical Exam  HENT:  Right Ear: Tympanic membrane and ear canal normal.  Left Ear: Tympanic membrane and ear canal normal.  Nose: Nose normal. No mucosal edema or rhinorrhea.  Neck: Neck supple. No JVD present.  Cardiovascular: Normal rate and regular rhythm.   Murmur heard. Pulmonary/Chest: Effort normal. No respiratory distress. She has no decreased breath sounds. She has no wheezes. She has rales in the right lower field and the left lower field.  Musculoskeletal: She exhibits no edema.  Vitals reviewed.         Assessment & Plan:  Bronchitis - Plan: azithromycin (ZITHROMAX) 250 MG tablet  Patient has prominent chest congestion today on her exam. I recommended she start a Z-Pak to cover for possible bronchitis and to treat the chest congestion  with Mucinex XR  twice daily.  Recheck in 1 week or sooner if worse.

## 2017-07-15 ENCOUNTER — Other Ambulatory Visit: Payer: Self-pay | Admitting: Family Medicine

## 2017-07-15 ENCOUNTER — Ambulatory Visit (INDEPENDENT_AMBULATORY_CARE_PROVIDER_SITE_OTHER): Payer: Medicare Other | Admitting: Family Medicine

## 2017-07-15 ENCOUNTER — Encounter: Payer: Self-pay | Admitting: Family Medicine

## 2017-07-15 VITALS — BP 168/74 | HR 96 | Temp 98.6°F | Resp 20 | Ht 62.5 in | Wt 157.0 lb

## 2017-07-15 DIAGNOSIS — J189 Pneumonia, unspecified organism: Secondary | ICD-10-CM

## 2017-07-15 MED ORDER — AMOXICILLIN-POT CLAVULANATE 875-125 MG PO TABS: 1.0000 | ORAL_TABLET | Freq: Two times a day (BID) | ORAL | 0 refills | Status: DC

## 2017-07-15 MED ORDER — HYDROCODONE-HOMATROPINE 5-1.5 MG/5ML PO SYRP: 5.0000 mL | ORAL_SOLUTION | Freq: Three times a day (TID) | ORAL | 0 refills | Status: DC | PRN

## 2017-07-15 NOTE — Progress Notes (Signed)
Subjective:    Patient ID: Hannah Galvan, female    DOB: [birth year], 83 y.o.   MRN: [id number]  HPI  07/13/17 Symptoms started last week. Symptoms consist of a cough productive of clear yellow sputum, worsening chest congestion, worsening cough. She denies any hemoptysis. She denies any fevers or chills. She does report some mild shortness of breath. She denies any rhinorrhea. She denies any sore throat. She believes she caught the infection from her sister who recently visited from [place].  At that time, my plan was: Patient has prominent chest congestion today on her exam. I recommended she start a Z-Pak to cover for possible bronchitis and to treat the chest congestion with Mucinex XR  twice daily.  Recheck in 1 week or sooner if worse.   07/15/17 Patient symptoms are getting worse. She is still oxygenating well. Pulse oximetry is 98% on room air. However she reports worsening cough, yellow sputum production with cough, worsening chest congestion, but continues to deny fevers. On exam, left upper lobe Rales are becoming more more prominent. There are also crackles in the left upper lobe. She continues to have occasional Rales in the bibasilar lobes. Past Medical History:  Diagnosis Date  . Allergy   . Anxiety   . Arthritis   . Blood transfusion without reported diagnosis   . Cancer ([place])    breast, s/p RXT, bilateral  . Cataract   . History of echocardiogram    a. Echo 8/16: Vigorous LVF, EF 93-90%, grade 1 diastolic dysfunction, trivial AI, mild MR, mild RVE, normal RV function, PASP 33 mmHg // b. Echo 1/17 EF 65-70%, normal wall motion, grade 1 diastolic dysfunction, trivial AI, MAC, mild MR // c. Echo 11/26/16: mild LVH, EF 60-65, mild AI, mild MR, mild LAE, mild to mod TR  . History of stress test    Lexiscan Myoview 8/16:  EF 81%, breast attenuation, no ischemia; Low Risk // Myoview 11/26/16: EF 75, apical defect c/w soft tissue attenuation, no ischemia or scar; Low Risk  .  Hyperlipidemia   . Hypertension   . Hypothyroidism   . Shortness of breath   . Thyroid disease    Past Surgical History:  Procedure Laterality Date  . ABDOMINAL HYSTERECTOMY  2008  . bladder tact    . BREAST LUMPECTOMY  2000  . BREAST LUMPECTOMY  2007  . CATARACT EXTRACTION  2011  . JOINT REPLACEMENT    . KNEE SURGERY     right  . ROTATOR CUFF REPAIR     right  . THYROID SURGERY  2002  . TOTAL KNEE ARTHROPLASTY Left 06/02/2013   Dr Rhona Raider  . TOTAL KNEE ARTHROPLASTY Right 06/02/2013   Procedure: RIGHT TOTAL KNEE ARTHROPLASTY;  Surgeon: Hessie Dibble, MD;  Location: [facility];  Service: Orthopedics;  Laterality: Right;  . TOTAL KNEE ARTHROPLASTY Left 08/22/2014   Procedure: TOTAL KNEE ARTHROPLASTY;  Surgeon: Hessie Dibble, MD;  Location: [facility];  Service: Orthopedics;  Laterality: Left;   Current Outpatient Prescriptions on File Prior to Visit  Medication Sig Dispense Refill  . amLODipine (NORVASC) 5 MG tablet Take 1 tablet (5 mg total) by mouth daily. (Patient taking differently: Take 10 mg by mouth daily. ) 90 tablet 3  . aspirin 81 MG tablet Take 81 mg by mouth daily.    Marland Kitchen azithromycin (ZITHROMAX) 250 MG tablet 2 tabs poqday1, 1 tab poqday 2-5 6 tablet 0  . Calcium Carbonate-Vitamin D (CALCIUM + D) 600-200 MG-UNIT TABS Take 1  tablet by mouth daily.     . clonazePAM (KLONOPIN) 1 MG tablet TAKE 1 TABLET TWICE A DAY AS NEEDED 60 tablet 2  . COMBIVENT RESPIMAT 20-100 MCG/ACT AERS respimat INHALE 1 PUFF INTO THE LUGNS EVERY 6 HOURS AS NEEDED FOR WHEEZING 2 Inhaler 0  . Cranberry 250 MG CAPS Take 250 mg by mouth daily.    Marland Kitchen donepezil (ARICEPT) 5 MG tablet TAKE 1 TABLET (5 MG TOTAL) BY MOUTH AT BEDTIME. 30 tablet 5  . exemestane (AROMASIN) 25 MG tablet TAKE 1 TABLET EVERY DAY 90 tablet 2  . folic acid (FOLVITE) 1 MG tablet TAKE 1 TABLET BY MOUTH EVERY DAY 90 tablet 3  . furosemide (LASIX) 20 MG tablet Take 1 tablet (20 mg total) by mouth daily. Can take 1 pill a day as needed for  swelling 30 tablet 3  . guaiFENesin (MUCINEX) 600 MG 12 hr tablet Take 600 mg by mouth 2 (two) times daily.    Marland Kitchen KLOR-CON M20 20 MEQ tablet TAKE 1 TABLET BY MOUTH DAILY 90 tablet 3  . levothyroxine (SYNTHROID, LEVOTHROID) 125 MCG tablet TAKE 1 TABLET BY MOUTH ONCE A DAY 90 tablet 2  . losartan (COZAAR) 100 MG tablet Take 1 tablet (100 mg total) by mouth daily. 30 tablet 1  . Multiple Vitamins-Minerals (CENTRUM SILVER ADULT 50+ PO) Take 1 tablet by mouth daily.     . nitroGLYCERIN (NITROSTAT) 0.4 MG SL tablet Place 1 tablet (0.4 mg total) under the tongue every 5 (five) minutes as needed for chest pain. 25 tablet 3  . nystatin ointment (MYCOSTATIN) Apply 1 application topically daily.    . pantoprazole (PROTONIX) 40 MG tablet Take 1 tablet (40 mg total) by mouth as directed. 40 MG DAILY FOR 1 MONTH THEN AS NEEDED 90 tablet 3  . rosuvastatin (CRESTOR) 10 MG tablet TAKE 1 TABLET BY MOUTH EVERY DAY AT 6PM 90 tablet 0  . sertraline (ZOLOFT) 50 MG tablet Take 1 tablet (50 mg total) by mouth at bedtime. (Patient taking differently: Take 50 mg by mouth every other day. ) 90 tablet 5  . traZODone (DESYREL) 50 MG tablet Take 1 tablet (50 mg total) by mouth at bedtime as needed for sleep. 90 tablet 3   No current facility-administered medications on file prior to visit.    Allergies  Allergen Reactions  . Ambien [Zolpidem]     confusion  . Pyridium [Phenazopyridine Hcl] Other (See Comments)    hemolysis  . Sulfa Antibiotics Rash   Social History   Social History  . Marital status: Widowed    Spouse name: N/A  . Number of children: N/A  . Years of education: N/A   Occupational History  . Not on file.   Social History Main Topics  . Smoking status: Never Smoker  . Smokeless tobacco: Never Used     Comment: never used tobacco  . Alcohol use No  . Drug use: No  . Sexual activity: Not Currently   Other Topics Concern  . Not on file   Social History Narrative  . No narrative on file        Review of Systems  All other systems reviewed and are negative.      Objective:   Physical Exam  HENT:  Right Ear: Tympanic membrane and ear canal normal.  Left Ear: Tympanic membrane and ear canal normal.  Nose: Nose normal. No mucosal edema or rhinorrhea.  Neck: Neck supple. No JVD present.  Cardiovascular: Normal rate and regular  rhythm.   Murmur heard. Pulmonary/Chest: Effort normal. No respiratory distress. She has no decreased breath sounds. She has no wheezes. She has rales in the right lower field, the left middle field and the left lower field.      Musculoskeletal: She exhibits no edema.  Vitals reviewed.         Assessment & Plan:  Walking pneumonia - Plan: DG Chest 2 View, amoxicillin-clavulanate (AUGMENTIN) 875-125 MG tablet   I am concerned that the patient is developing walking pneumonia despite being on azithromycin. This raises the concern about possible macrolide resistance. Therefore I'll add to the patient Augmentin 875 mg by mouth twice a day for possible community-acquired pneumonia for the next 10 days. I will send the patient for a chest x-ray as soon as possible. Recheck the patient on Monday or immediately if getting worse. I also gave the patient Hycodan 1 teaspoon every 8 hours as needed for cough and encouraged pulmonary toilet.

## 2017-07-16 ENCOUNTER — Ambulatory Visit
Admission: RE | Admit: 2017-07-16 | Discharge: 2017-07-16 | Disposition: A | Discharge status: Home / Self Care | Payer: Medicare Other | Source: Ambulatory Visit | Attending: Family Medicine | Admitting: Family Medicine

## 2017-07-16 DIAGNOSIS — J189 Pneumonia, unspecified organism: Secondary | ICD-10-CM

## 2017-07-20 ENCOUNTER — Encounter: Payer: Self-pay | Admitting: Family Medicine

## 2017-07-20 ENCOUNTER — Ambulatory Visit (INDEPENDENT_AMBULATORY_CARE_PROVIDER_SITE_OTHER): Payer: Medicare Other | Admitting: Family Medicine

## 2017-07-20 VITALS — BP 140/72 | HR 74 | Temp 98.0°F | Resp 18 | Ht 62.5 in | Wt 156.0 lb

## 2017-07-20 DIAGNOSIS — J40 Bronchitis, not specified as acute or chronic: Secondary | ICD-10-CM | POA: Diagnosis not present

## 2017-07-20 NOTE — Progress Notes (Signed)
Subjective:    Patient ID: Hannah Galvan, female    DOB: 07/27/1931, 83 y.o.   MRN: [id number]  HPI  07/13/17 Symptoms started last week. Symptoms consist of a cough productive of clear yellow sputum, worsening chest congestion, worsening cough. She denies any hemoptysis. She denies any fevers or chills. She does report some mild shortness of breath. She denies any rhinorrhea. She denies any sore throat. She believes she caught the infection from her sister who recently visited from [place].  At that time, my plan was: Patient has prominent chest congestion today on her exam. I recommended she start a Z-Pak to cover for possible bronchitis and to treat the chest congestion with Mucinex XR  twice daily.  Recheck in 1 week or sooner if worse.   07/15/17 Patient symptoms are getting worse. She is still oxygenating well. Pulse oximetry is 98% on room air. However she reports worsening cough, yellow sputum production with cough, worsening chest congestion, but continues to deny fevers. On exam, left upper lobe Rales are becoming more more prominent. There are also crackles in the left upper lobe. She continues to have occasional Rales in the bibasilar lobes.  At that time, my plan was:   I am concerned that the patient is developing walking pneumonia despite being on azithromycin. This raises the concern about possible macrolide resistance. Therefore I'll add to the patient Augmentin 875 mg by mouth twice a day for possible community-acquired pneumonia for the next 10 days. I will send the patient for a chest x-ray as soon as possible. Recheck the patient on Monday or immediately if getting worse. I also gave the patient Hycodan 1 teaspoon every 8 hours as needed for cough and encouraged pulmonary toilet.    07/20/17 Lungs are much better today. Cough has improved dramatically since starting Augmentin. She denies any fevers. Shortness of breath is better. Blood pressure today is much better. She denies any  chest pain. She denies any hemoptysis. She denies any pleurisy. The abnormal breath sounds have resolved. Past Medical History:  Diagnosis Date  . Allergy   . Anxiety   . Arthritis   . Blood transfusion without reported diagnosis   . Cancer ([place])    breast, s/p RXT, bilateral  . Cataract   . History of echocardiogram    a. Echo 8/16: Vigorous LVF, EF 10-27%, grade 1 diastolic dysfunction, trivial AI, mild MR, mild RVE, normal RV function, PASP 33 mmHg // b. Echo 1/17 EF 65-70%, normal wall motion, grade 1 diastolic dysfunction, trivial AI, MAC, mild MR // c. Echo 11/26/16: mild LVH, EF 60-65, mild AI, mild MR, mild LAE, mild to mod TR  . History of stress test    Lexiscan Myoview 8/16:  EF 81%, breast attenuation, no ischemia; Low Risk // Myoview 11/26/16: EF 75, apical defect c/w soft tissue attenuation, no ischemia or scar; Low Risk  . Hyperlipidemia   . Hypertension   . Hypothyroidism   . Shortness of breath   . Thyroid disease    Past Surgical History:  Procedure Laterality Date  . ABDOMINAL HYSTERECTOMY  2008  . bladder tact    . BREAST LUMPECTOMY  2000  . BREAST LUMPECTOMY  2007  . CATARACT EXTRACTION  2011  . JOINT REPLACEMENT    . KNEE SURGERY     right  . ROTATOR CUFF REPAIR     right  . THYROID SURGERY  2002  . TOTAL KNEE ARTHROPLASTY Left 06/02/2013   Dr Rhona Raider  .  TOTAL KNEE ARTHROPLASTY Right 06/02/2013   Procedure: RIGHT TOTAL KNEE ARTHROPLASTY;  Surgeon: Hessie Dibble, MD;  Location: [facility];  Service: Orthopedics;  Laterality: Right;  . TOTAL KNEE ARTHROPLASTY Left 08/22/2014   Procedure: TOTAL KNEE ARTHROPLASTY;  Surgeon: Hessie Dibble, MD;  Location: [facility];  Service: Orthopedics;  Laterality: Left;   Current Outpatient Prescriptions on File Prior to Visit  Medication Sig Dispense Refill  . amLODipine (NORVASC) 5 MG tablet Take 1 tablet (5 mg total) by mouth daily. (Patient taking differently: Take 10 mg by mouth daily. ) 90 tablet 3  .  amoxicillin-clavulanate (AUGMENTIN) 875-125 MG tablet Take 1 tablet by mouth 2 (two) times daily. 20 tablet 0  . aspirin 81 MG tablet Take 81 mg by mouth daily.    . Calcium Carbonate-Vitamin D (CALCIUM + D) 600-200 MG-UNIT TABS Take 1 tablet by mouth daily.     . clonazePAM (KLONOPIN) 1 MG tablet TAKE 1 TABLET TWICE A DAY AS NEEDED 60 tablet 2  . COMBIVENT RESPIMAT 20-100 MCG/ACT AERS respimat INHALE 1 PUFF INTO THE LUGNS EVERY 6 HOURS AS NEEDED FOR WHEEZING 2 Inhaler 0  . Cranberry 250 MG CAPS Take 250 mg by mouth daily.    Marland Kitchen donepezil (ARICEPT) 5 MG tablet TAKE 1 TABLET (5 MG TOTAL) BY MOUTH AT BEDTIME. 30 tablet 5  . exemestane (AROMASIN) 25 MG tablet TAKE 1 TABLET EVERY DAY 90 tablet 2  . folic acid (FOLVITE) 1 MG tablet TAKE 1 TABLET BY MOUTH EVERY DAY 90 tablet 3  . furosemide (LASIX) 20 MG tablet Take 1 tablet (20 mg total) by mouth daily. Can take 1 pill a day as needed for swelling 30 tablet 3  . guaiFENesin (MUCINEX) 600 MG 12 hr tablet Take 600 mg by mouth 2 (two) times daily.    Marland Kitchen HYDROcodone-homatropine (HYCODAN) 5-1.5 MG/5ML syrup Take 5 mLs by mouth every 8 (eight) hours as needed for cough. 120 mL 0  . KLOR-CON M20 20 MEQ tablet TAKE 1 TABLET BY MOUTH DAILY 90 tablet 3  . levothyroxine (SYNTHROID, LEVOTHROID) 125 MCG tablet TAKE 1 TABLET BY MOUTH ONCE A DAY 90 tablet 2  . losartan (COZAAR) 100 MG tablet Take 1 tablet (100 mg total) by mouth daily. 30 tablet 1  . Multiple Vitamins-Minerals (CENTRUM SILVER ADULT 50+ PO) Take 1 tablet by mouth daily.     . nitroGLYCERIN (NITROSTAT) 0.4 MG SL tablet Place 1 tablet (0.4 mg total) under the tongue every 5 (five) minutes as needed for chest pain. 25 tablet 3  . nystatin ointment (MYCOSTATIN) Apply 1 application topically daily.    . pantoprazole (PROTONIX) 40 MG tablet Take 1 tablet (40 mg total) by mouth as directed. 40 MG DAILY FOR 1 MONTH THEN AS NEEDED 90 tablet 3  . rosuvastatin (CRESTOR) 10 MG tablet TAKE 1 TABLET BY MOUTH EVERY  DAY AT 6PM 90 tablet 3  . sertraline (ZOLOFT) 50 MG tablet Take 1 tablet (50 mg total) by mouth at bedtime. (Patient taking differently: Take 50 mg by mouth every other day. ) 90 tablet 5  . traZODone (DESYREL) 50 MG tablet Take 1 tablet (50 mg total) by mouth at bedtime as needed for sleep. 90 tablet 3   No current facility-administered medications on file prior to visit.    Allergies  Allergen Reactions  . Ambien [Zolpidem]     confusion  . Pyridium [Phenazopyridine Hcl] Other (See Comments)    hemolysis  . Sulfa Antibiotics Rash  Social History   Social History  . Marital status: Widowed    Spouse name: N/A  . Number of children: N/A  . Years of education: N/A   Occupational History  . Not on file.   Social History Main Topics  . Smoking status: Never Smoker  . Smokeless tobacco: Never Used     Comment: never used tobacco  . Alcohol use No  . Drug use: No  . Sexual activity: Not Currently   Other Topics Concern  . Not on file   Social History Narrative  . No narrative on file       Review of Systems  All other systems reviewed and are negative.      Objective:   Physical Exam  HENT:  Right Ear: Tympanic membrane and ear canal normal.  Left Ear: Tympanic membrane and ear canal normal.  Nose: Nose normal. No mucosal edema or rhinorrhea.  Neck: Neck supple. No JVD present.  Cardiovascular: Normal rate and regular rhythm.   Murmur heard. Pulmonary/Chest: Effort normal. No respiratory distress. She has no decreased breath sounds. She has no wheezes. She has no rales.  Musculoskeletal: She exhibits no edema.  Vitals reviewed.         Assessment & Plan:  Bronchitis  Patient is doing much better now. I asked her to complete the prescribed course of Augmentin. She can discontinue Mucinex when she feels better. Her blood pressure today is back to her goal. Her chest x-ray revealed no pneumonia but did confirm bronchitis

## 2017-07-21 ENCOUNTER — Telehealth: Payer: Self-pay | Admitting: Family Medicine

## 2017-07-21 ENCOUNTER — Other Ambulatory Visit: Payer: Self-pay | Admitting: Family Medicine

## 2017-07-21 DIAGNOSIS — E2839 Other primary ovarian failure: Secondary | ICD-10-CM

## 2017-07-21 DIAGNOSIS — Z1231 Encounter for screening mammogram for malignant neoplasm of breast: Secondary | ICD-10-CM

## 2017-07-21 MED ORDER — FLUTICASONE PROPIONATE 50 MCG/ACT NA SUSP: 2.0000 | Freq: Every day | NASAL | 6 refills | Status: DC

## 2017-07-21 MED ORDER — HYDROCHLOROTHIAZIDE 25 MG PO TABS: 25.0000 mg | ORAL_TABLET | Freq: Every day | ORAL | 3 refills | Status: DC

## 2017-07-21 NOTE — Telephone Encounter (Signed)
Pt aware of changes, med sent to pharm and son aware of med changes

## 2017-07-21 NOTE — Telephone Encounter (Signed)
Follow up  Not sure if this should go to triage due to symptoms below. Pt is scheduled for 8.30.18.  Please f/u if needed

## 2017-07-21 NOTE — Telephone Encounter (Signed)
New Message  Pt voiced head stopped up, feels full, hurts, and also feels bloaded/bload up along with BP readings = 172/72/80  No open slots for today  Please f/u with pt

## 2017-07-21 NOTE — Telephone Encounter (Signed)
I am worried about her blood pressure.  STop lasix.  Ensure she is on losartan 100 mg a day and amlodipine 10 mg a day.  If so, add hctz 25 poqday and stop lasix.  She was better yesterday and is on abx, I feel the stuffy head can wait until 8/30.  BP is more pressing.

## 2017-07-23 ENCOUNTER — Encounter: Payer: Self-pay | Admitting: Family Medicine

## 2017-07-23 ENCOUNTER — Ambulatory Visit (INDEPENDENT_AMBULATORY_CARE_PROVIDER_SITE_OTHER): Payer: Medicare Other | Admitting: Family Medicine

## 2017-07-23 VITALS — BP 152/70 | HR 80 | Temp 98.7°F | Resp 18 | Ht 62.5 in | Wt 157.0 lb

## 2017-07-23 DIAGNOSIS — R0981 Nasal congestion: Secondary | ICD-10-CM | POA: Diagnosis not present

## 2017-07-23 DIAGNOSIS — R519 Headache, unspecified: Secondary | ICD-10-CM

## 2017-07-23 DIAGNOSIS — R51 Headache: Secondary | ICD-10-CM | POA: Diagnosis not present

## 2017-07-23 DIAGNOSIS — I10 Essential (primary) hypertension: Secondary | ICD-10-CM | POA: Diagnosis not present

## 2017-07-23 NOTE — Progress Notes (Signed)
Subjective:    Patient ID: Hannah Galvan, female    DOB: [birth year], 83 y.o.   MRN: [id number]  HPI  07/13/17 Symptoms started last week. Symptoms consist of a cough productive of clear yellow sputum, worsening chest congestion, worsening cough. She denies any hemoptysis. She denies any fevers or chills. She does report some mild shortness of breath. She denies any rhinorrhea. She denies any sore throat. She believes she caught the infection from her sister who recently visited from [place].  At that time, my plan was: Patient has prominent chest congestion today on her exam. I recommended she start a Z-Pak to cover for possible bronchitis and to treat the chest congestion with Mucinex XR  twice daily.  Recheck in 1 week or sooner if worse.   07/15/17 Patient symptoms are getting worse. She is still oxygenating well. Pulse oximetry is 98% on room air. However she reports worsening cough, yellow sputum production with cough, worsening chest congestion, but continues to deny fevers. On exam, left upper lobe Rales are becoming more more prominent. There are also crackles in the left upper lobe. She continues to have occasional Rales in the bibasilar lobes.  At that time, my plan was:   I am concerned that the patient is developing walking pneumonia despite being on azithromycin. This raises the concern about possible macrolide resistance. Therefore I'll add to the patient Augmentin 875 mg by mouth twice a day for possible community-acquired pneumonia for the next 10 days. I will send the patient for a chest x-ray as soon as possible. Recheck the patient on Monday or immediately if getting worse. I also gave the patient Hycodan 1 teaspoon every 8 hours as needed for cough and encouraged pulmonary toilet.    07/20/17 Lungs are much better today. Cough has improved dramatically since starting Augmentin. She denies any fevers. Shortness of breath is better. Blood pressure today is much better. She denies any  chest pain. She denies any hemoptysis. She denies any pleurisy. The abnormal breath sounds have resolved.  At that time, my plan was: Patient is doing much better now. I asked her to complete the prescribed course of Augmentin. She can discontinue Mucinex when she feels better. Her blood pressure today is back to her goal. Her chest x-ray revealed no pneumonia but did confirm bronchitis  07/23/17 Patient call back to the office on August 28 complaining of pressure in her head. She also reports head congestion. She denies any rhinorrhea. She does have some mild tenderness to percussion over frontal and maxillary sinuses. Her blood pressure at home has also been running 453-646 systolic. She is supposed to be on losartan 100 mg a day and amlodipine 10 mg a day. I asked my nurse to verify with the patient and her son that that is in fact what she was taking. The patient states that this is what she is taking. However the patient has mild dementia and gets very confused and I question if she is taking the medication accurately. However the pressure in her head seems to worsen when her blood pressure is high. Therefore we recommended that she start hydrochlorothiazide in addition to the other 2 medications. We also recommended that she start Flonase for head congestion and sinus pressure. This was on Tuesday. Patient is here today continuing to complain of the symptoms however she has not started hydrochlorothiazide. She is also not started Flonase. This morning her systolic blood pressure was 180 Past Medical History:  Diagnosis Date  .  Allergy   . Anxiety   . Arthritis   . Blood transfusion without reported diagnosis   . Cancer ([place])    breast, s/p RXT, bilateral  . Cataract   . History of echocardiogram    a. Echo 8/16: Vigorous LVF, EF 76-72%, grade 1 diastolic dysfunction, trivial AI, mild MR, mild RVE, normal RV function, PASP 33 mmHg // b. Echo 1/17 EF 65-70%, normal wall motion, grade 1 diastolic  dysfunction, trivial AI, MAC, mild MR // c. Echo 11/26/16: mild LVH, EF 60-65, mild AI, mild MR, mild LAE, mild to mod TR  . History of stress test    Lexiscan Myoview 8/16:  EF 81%, breast attenuation, no ischemia; Low Risk // Myoview 11/26/16: EF 75, apical defect c/w soft tissue attenuation, no ischemia or scar; Low Risk  . Hyperlipidemia   . Hypertension   . Hypothyroidism   . Shortness of breath   . Thyroid disease    Past Surgical History:  Procedure Laterality Date  . ABDOMINAL HYSTERECTOMY  2008  . bladder tact    . BREAST LUMPECTOMY  2000  . BREAST LUMPECTOMY  2007  . CATARACT EXTRACTION  2011  . JOINT REPLACEMENT    . KNEE SURGERY     right  . ROTATOR CUFF REPAIR     right  . THYROID SURGERY  2002  . TOTAL KNEE ARTHROPLASTY Left 06/02/2013   Dr Rhona Raider  . TOTAL KNEE ARTHROPLASTY Right 06/02/2013   Procedure: RIGHT TOTAL KNEE ARTHROPLASTY;  Surgeon: Hessie Dibble, MD;  Location: [facility];  Service: Orthopedics;  Laterality: Right;  . TOTAL KNEE ARTHROPLASTY Left 08/22/2014   Procedure: TOTAL KNEE ARTHROPLASTY;  Surgeon: Hessie Dibble, MD;  Location: [facility];  Service: Orthopedics;  Laterality: Left;   Current Outpatient Prescriptions on File Prior to Visit  Medication Sig Dispense Refill  . amLODipine (NORVASC) 5 MG tablet Take 1 tablet (5 mg total) by mouth daily. (Patient taking differently: Take 10 mg by mouth daily. ) 90 tablet 3  . amoxicillin-clavulanate (AUGMENTIN) 875-125 MG tablet Take 1 tablet by mouth 2 (two) times daily. 20 tablet 0  . aspirin 81 MG tablet Take 81 mg by mouth daily.    . Calcium Carbonate-Vitamin D (CALCIUM + D) 600-200 MG-UNIT TABS Take 1 tablet by mouth daily.     . clonazePAM (KLONOPIN) 1 MG tablet TAKE 1 TABLET TWICE A DAY AS NEEDED 60 tablet 2  . COMBIVENT RESPIMAT 20-100 MCG/ACT AERS respimat INHALE 1 PUFF INTO THE LUGNS EVERY 6 HOURS AS NEEDED FOR WHEEZING 2 Inhaler 0  . Cranberry 250 MG CAPS Take 250 mg by mouth daily.    Marland Kitchen donepezil  (ARICEPT) 5 MG tablet TAKE 1 TABLET (5 MG TOTAL) BY MOUTH AT BEDTIME. 30 tablet 5  . exemestane (AROMASIN) 25 MG tablet TAKE 1 TABLET EVERY DAY 90 tablet 2  . fluticasone (FLONASE) 50 MCG/ACT nasal spray Place 2 sprays into both nostrils daily. 16 g 6  . folic acid (FOLVITE) 1 MG tablet TAKE 1 TABLET BY MOUTH EVERY DAY 90 tablet 3  . guaiFENesin (MUCINEX) 600 MG 12 hr tablet Take 600 mg by mouth 2 (two) times daily.    . hydrochlorothiazide (HYDRODIURIL) 25 MG tablet Take 1 tablet (25 mg total) by mouth daily. 90 tablet 3  . HYDROcodone-homatropine (HYCODAN) 5-1.5 MG/5ML syrup Take 5 mLs by mouth every 8 (eight) hours as needed for cough. 120 mL 0  . KLOR-CON M20 20 MEQ tablet TAKE 1 TABLET BY  MOUTH DAILY 90 tablet 3  . levothyroxine (SYNTHROID, LEVOTHROID) 125 MCG tablet TAKE 1 TABLET BY MOUTH ONCE A DAY 90 tablet 2  . losartan (COZAAR) 100 MG tablet Take 1 tablet (100 mg total) by mouth daily. 30 tablet 1  . Multiple Vitamins-Minerals (CENTRUM SILVER ADULT 50+ PO) Take 1 tablet by mouth daily.     . nitroGLYCERIN (NITROSTAT) 0.4 MG SL tablet Place 1 tablet (0.4 mg total) under the tongue every 5 (five) minutes as needed for chest pain. 25 tablet 3  . nystatin ointment (MYCOSTATIN) Apply 1 application topically daily.    . pantoprazole (PROTONIX) 40 MG tablet Take 1 tablet (40 mg total) by mouth as directed. 40 MG DAILY FOR 1 MONTH THEN AS NEEDED 90 tablet 3  . rosuvastatin (CRESTOR) 10 MG tablet TAKE 1 TABLET BY MOUTH EVERY DAY AT 6PM 90 tablet 3  . sertraline (ZOLOFT) 50 MG tablet Take 1 tablet (50 mg total) by mouth at bedtime. (Patient taking differently: Take 50 mg by mouth every other day. ) 90 tablet 5  . traZODone (DESYREL) 50 MG tablet Take 1 tablet (50 mg total) by mouth at bedtime as needed for sleep. 90 tablet 3   No current facility-administered medications on file prior to visit.    Allergies  Allergen Reactions  . Ambien [Zolpidem]     confusion  . Pyridium [Phenazopyridine  Hcl] Other (See Comments)    hemolysis  . Sulfa Antibiotics Rash   Social History   Social History  . Marital status: Widowed    Spouse name: N/A  . Number of children: N/A  . Years of education: N/A   Occupational History  . Not on file.   Social History Main Topics  . Smoking status: Never Smoker  . Smokeless tobacco: Never Used     Comment: never used tobacco  . Alcohol use No  . Drug use: No  . Sexual activity: Not Currently   Other Topics Concern  . Not on file   Social History Narrative  . No narrative on file       Review of Systems  All other systems reviewed and are negative.      Objective:   Physical Exam  HENT:  Right Ear: Tympanic membrane, external ear and ear canal normal.  Left Ear: Tympanic membrane and ear canal normal.  Nose: Mucosal edema present. No rhinorrhea. Right sinus exhibits maxillary sinus tenderness. Left sinus exhibits maxillary sinus tenderness.  Mouth/Throat: Oropharynx is clear and moist.  Neck: Neck supple. No JVD present.  Cardiovascular: Normal rate and regular rhythm.   Murmur heard. Pulmonary/Chest: Effort normal. No respiratory distress. She has no decreased breath sounds. She has no wheezes. She has no rales.  Musculoskeletal: She exhibits no edema.  Vitals reviewed.         Assessment & Plan:  Head congestion  Pressure in head  Essential hypertension, benign  It is difficult to pin down with the history exactly what her biggest symptom is. I do not believe she has a sinus infection but I do believe she may have some sinus congestion causing part of her pressure. I believe the vast majority is due to her elevated blood pressure. Therefore I recommended that the patient begin hydrochlorothiazide 25 mg a day in addition to Flonase 2 sprays each nostril daily. She is to discontinue Lasix which she was taking sparingly anyway. She is to continue amlodipine. She is to continue losartan. She will be taking losartan 100  mg  a day and amlodipine 10 mg a day. I will recheck her blood pressure and see how she is doing next week. I also mentioned to the patient that I believe she would be safer in a senior living center. I believe the patient has mild dementia and gets easily confused and would be in a safer environment if she were living with someone. Also believe that she gets very lonely and that this may be more beneficial to her emotionally as well. At the present time, the patient has no interest in this.

## 2017-08-04 ENCOUNTER — Ambulatory Visit
Admission: RE | Admit: 2017-08-04 | Discharge: 2017-08-04 | Disposition: A | Discharge status: Home / Self Care | Payer: Medicare Other | Source: Ambulatory Visit | Attending: Family Medicine | Admitting: Family Medicine

## 2017-08-04 DIAGNOSIS — Z1231 Encounter for screening mammogram for malignant neoplasm of breast: Secondary | ICD-10-CM

## 2017-08-04 DIAGNOSIS — E2839 Other primary ovarian failure: Secondary | ICD-10-CM

## 2017-08-05 ENCOUNTER — Telehealth: Payer: Self-pay | Admitting: *Deleted

## 2017-08-05 ENCOUNTER — Encounter: Payer: Self-pay | Admitting: *Deleted

## 2017-08-05 ENCOUNTER — Other Ambulatory Visit: Payer: Self-pay | Admitting: *Deleted

## 2017-08-05 MED ORDER — GNP CALCIUM 1200 1200-1000 MG-UNIT PO CHEW: CHEWABLE_TABLET | ORAL | Status: AC

## 2017-08-05 NOTE — Telephone Encounter (Signed)
Received call from patient. Requested OV with PCP in AM for SOB.  Patient noted to be audibly gasping while on phone and voicing C/O "I can't breathe". Advised to call EMS to go to ER. States that she cannot go to ER at this time because she is watching her grandchild.   Advised that SOB with gasping breath sounds is an emergency, and she could possibly putting both hers and her grandchild's health in danger. Advised to take him with her to ER for evaluation. Patient stated "ok".  PCP to be made aware.

## 2017-08-06 ENCOUNTER — Encounter: Payer: Self-pay | Admitting: Family Medicine

## 2017-08-06 ENCOUNTER — Ambulatory Visit (INDEPENDENT_AMBULATORY_CARE_PROVIDER_SITE_OTHER): Payer: Medicare Other | Admitting: Family Medicine

## 2017-08-06 ENCOUNTER — Ambulatory Visit: Payer: Self-pay | Admitting: Family Medicine

## 2017-08-06 ENCOUNTER — Other Ambulatory Visit: Payer: Self-pay | Admitting: Family Medicine

## 2017-08-06 VITALS — BP 148/70 | HR 80 | Temp 97.8°F | Resp 16 | Ht 62.5 in | Wt 157.0 lb

## 2017-08-06 DIAGNOSIS — R0602 Shortness of breath: Secondary | ICD-10-CM

## 2017-08-06 DIAGNOSIS — I10 Essential (primary) hypertension: Secondary | ICD-10-CM | POA: Diagnosis not present

## 2017-08-06 DIAGNOSIS — G4700 Insomnia, unspecified: Secondary | ICD-10-CM

## 2017-08-06 DIAGNOSIS — F410 Panic disorder [episodic paroxysmal anxiety] without agoraphobia: Secondary | ICD-10-CM | POA: Diagnosis not present

## 2017-08-06 MED ORDER — LORAZEPAM 1 MG PO TABS: 1.0000 mg | ORAL_TABLET | Freq: Two times a day (BID) | ORAL | 1 refills | Status: DC | PRN

## 2017-08-06 NOTE — Telephone Encounter (Signed)
Called patient.  Appointment scheduled.

## 2017-08-06 NOTE — Telephone Encounter (Signed)
No note from er, did not go, ntbs
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
## 2017-08-07 NOTE — Telephone Encounter (Signed)
Medication refilled per protocol. 

## 2017-08-13 ENCOUNTER — Telehealth: Payer: Self-pay | Admitting: *Deleted

## 2017-08-13 ENCOUNTER — Telehealth: Payer: Self-pay | Admitting: Family Medicine

## 2017-08-13 ENCOUNTER — Ambulatory Visit
Admission: RE | Admit: 2017-08-13 | Discharge: 2017-08-13 | Disposition: A | Discharge status: Home / Self Care | Payer: Medicare Other | Source: Ambulatory Visit | Attending: Family Medicine | Admitting: Family Medicine

## 2017-08-13 DIAGNOSIS — R519 Headache, unspecified: Secondary | ICD-10-CM

## 2017-08-13 DIAGNOSIS — R51 Headache: Secondary | ICD-10-CM

## 2017-08-13 DIAGNOSIS — R4182 Altered mental status, unspecified: Secondary | ICD-10-CM

## 2017-08-13 NOTE — Telephone Encounter (Signed)
MD has reviewed and patient is aware.

## 2017-08-13 NOTE — Telephone Encounter (Signed)
Patient daughter brought patient to office. Reports that patient woke up with "splitting HA" and slight confusion. Patient engaged her [facility] and was evaluated from EMS. EMS states that do not think she has had CVA, but recommended ER evaluation. Patient declined. EMS then recommended F/U with PCP.  Patient in car in parking lot. Noted to respond appropriately to questions. VS WNL. Patient stable.   Appointment scheduled for 08/14/2017. MD aware and NO obtained for CT head w/o contrast STAT. Orders placed and referral coordinator made aware.

## 2017-08-13 NOTE — Telephone Encounter (Signed)
[facility] called to let us know that the results are now in on Hannah Galvan imaging.

## 2017-08-14 ENCOUNTER — Ambulatory Visit (INDEPENDENT_AMBULATORY_CARE_PROVIDER_SITE_OTHER): Payer: Medicare Other | Admitting: Family Medicine

## 2017-08-14 ENCOUNTER — Encounter: Payer: Self-pay | Admitting: Family Medicine

## 2017-08-14 VITALS — BP 160/90 | HR 76 | Temp 97.9°F | Resp 20 | Wt 155.0 lb

## 2017-08-14 DIAGNOSIS — R51 Headache: Secondary | ICD-10-CM | POA: Diagnosis not present

## 2017-08-14 DIAGNOSIS — I10 Essential (primary) hypertension: Secondary | ICD-10-CM | POA: Diagnosis not present

## 2017-08-14 DIAGNOSIS — R519 Headache, unspecified: Secondary | ICD-10-CM

## 2017-08-14 MED ORDER — PREDNISONE 20 MG PO TABS: ORAL_TABLET | ORAL | 0 refills | Status: DC
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
## 2017-08-17 ENCOUNTER — Telehealth: Payer: Self-pay | Admitting: Family Medicine

## 2017-08-17 NOTE — Telephone Encounter (Signed)
Pt called stating that she started her prednisone packet today, it is causing her to be achy all over and she is not going to take anymore of it. Can you please call her back and let her know if there is something else that we can prescribe?

## 2017-08-17 NOTE — Telephone Encounter (Signed)
Recommendations before I call her back?

## 2017-08-18 MED ORDER — TRAMADOL HCL 50 MG PO TABS: 50.0000 mg | ORAL_TABLET | Freq: Three times a day (TID) | ORAL | 0 refills | Status: DC | PRN

## 2017-08-18 NOTE — Telephone Encounter (Signed)
Patient aware of providers recommendations and med sent to pharm 

## 2017-08-18 NOTE — Telephone Encounter (Signed)
Stop prednisone and try tramadol 50 mg po q 8 hrs prn headache.

## 2017-08-21 ENCOUNTER — Encounter: Payer: Self-pay | Admitting: Family Medicine

## 2017-08-21 ENCOUNTER — Ambulatory Visit (INDEPENDENT_AMBULATORY_CARE_PROVIDER_SITE_OTHER): Payer: Medicare Other | Admitting: Family Medicine

## 2017-08-21 VITALS — BP 144/70 | HR 74 | Temp 97.9°F | Resp 14 | Ht 62.5 in | Wt 156.0 lb

## 2017-08-21 DIAGNOSIS — R519 Headache, unspecified: Secondary | ICD-10-CM

## 2017-08-21 DIAGNOSIS — R51 Headache: Secondary | ICD-10-CM

## 2017-08-21 DIAGNOSIS — G4700 Insomnia, unspecified: Secondary | ICD-10-CM | POA: Diagnosis not present

## 2017-08-21 MED ORDER — TRAZODONE HCL 50 MG PO TABS: 100.0000 mg | ORAL_TABLET | Freq: Every evening | ORAL | 3 refills | Status: DC | PRN
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
## 2017-08-25 ENCOUNTER — Emergency Department (HOSPITAL_COMMUNITY)
Admission: EM | Admit: 2017-08-25 | Discharge: 2017-08-26 | Disposition: A | Discharge status: Home / Self Care | Payer: Medicare Other | Attending: Emergency Medicine | Admitting: Emergency Medicine

## 2017-08-25 ENCOUNTER — Encounter (HOSPITAL_COMMUNITY): Payer: Self-pay | Admitting: Emergency Medicine

## 2017-08-25 DIAGNOSIS — Z7982 Long term (current) use of aspirin: Secondary | ICD-10-CM | POA: Insufficient documentation

## 2017-08-25 DIAGNOSIS — G309 Alzheimer's disease, unspecified: Secondary | ICD-10-CM | POA: Diagnosis not present

## 2017-08-25 DIAGNOSIS — E039 Hypothyroidism, unspecified: Secondary | ICD-10-CM | POA: Insufficient documentation

## 2017-08-25 DIAGNOSIS — Z853 Personal history of malignant neoplasm of breast: Secondary | ICD-10-CM | POA: Diagnosis not present

## 2017-08-25 DIAGNOSIS — I10 Essential (primary) hypertension: Secondary | ICD-10-CM | POA: Insufficient documentation

## 2017-08-25 DIAGNOSIS — Z79899 Other long term (current) drug therapy: Secondary | ICD-10-CM | POA: Diagnosis not present

## 2017-08-25 DIAGNOSIS — F0280 Dementia in other diseases classified elsewhere without behavioral disturbance: Secondary | ICD-10-CM | POA: Insufficient documentation

## 2017-08-25 DIAGNOSIS — Z96653 Presence of artificial knee joint, bilateral: Secondary | ICD-10-CM | POA: Insufficient documentation

## 2017-08-25 NOTE — ED Triage Notes (Signed)
Pt arrives via EMs to triage. Pt called out because was hypertensive at home 200systolic. Pt reports recent medication changes by PCP. For EMs bp 160 sys. Pt alert oriented, no neuro deficits noted. C/o pressure in head when bp was elevated.

## 2017-08-26 LAB — I-STAT CHEM 8, ED
BUN: 10 mg/dL (ref 6–20)
CHLORIDE: 93 mmol/L — AB (ref 101–111)
Calcium, Ion: 1.09 mmol/L — ABNORMAL LOW (ref 1.15–1.40)
Creatinine, Ser: 0.7 mg/dL (ref 0.44–1.00)
GLUCOSE: 76 mg/dL (ref 65–99)
HEMATOCRIT: 41 % (ref 36.0–46.0)
HEMOGLOBIN: 13.9 g/dL (ref 12.0–15.0)
POTASSIUM: 3.4 mmol/L — AB (ref 3.5–5.1)
Sodium: 131 mmol/L — ABNORMAL LOW (ref 135–145)
TCO2: 25 mmol/L (ref 22–32)

## 2017-08-26 MED ORDER — POTASSIUM CHLORIDE CRYS ER 20 MEQ PO TBCR
40.0000 meq | EXTENDED_RELEASE_TABLET | Freq: Once | ORAL | Status: AC
  Administered 2017-08-26: 40 meq via ORAL
  Filled 2017-08-26: qty 2

## 2017-08-26 NOTE — Discharge Instructions (Signed)
You were seen today for high blood pressure. Your workup is reassuring and you're currently asymptomatic. Follow-up with her primary doctor regarding adjustments in blood pressure medication. If you develop worsening headache, chest pain or any new or worsening symptoms you should be reevaluated.

## 2017-08-26 NOTE — ED Provider Notes (Signed)
[facility] DEPT Provider Note   CSN: [id number] Arrival date & time: 08/25/17  1713     History   Chief Complaint Chief Complaint  Patient presents with  . Hypertension    HPI Hannah Galvan is a 83 y.o. female.  HPI  This is an 83 year old female with a history of hypertension, breast cancer, hyperlipidemia who presents with hypertension. Patient states that she felt funny at home and had some head pressure. She took her blood pressure and it was 621 systolic. She called EMS. Repeat blood pressure is 308 and 657 systolic respectively.  Eyes chest pain, nausea, vomiting, focal weakness or numbness at that time. She currently is asymptomatic and feels okay. Blood pressure here 154/65. Patient reports recent history of high blood pressures. Last blood pressure in Dr. office 160/90.  She reports recent medications added to her blood pressure regimen. She has taken her meds for the day.  Past Medical History:  Diagnosis Date  . Allergy   . Anxiety   . Arthritis   . Blood transfusion without reported diagnosis   . Cancer ([place])    breast, s/p RXT, bilateral  . Cataract   . History of echocardiogram    a. Echo 8/16: Vigorous LVF, EF 84-69%, grade 1 diastolic dysfunction, trivial AI, mild MR, mild RVE, normal RV function, PASP 33 mmHg // b. Echo 1/17 EF 65-70%, normal wall motion, grade 1 diastolic dysfunction, trivial AI, MAC, mild MR // c. Echo 11/26/16: mild LVH, EF 60-65, mild AI, mild MR, mild LAE, mild to mod TR  . History of stress test    Lexiscan Myoview 8/16:  EF 81%, breast attenuation, no ischemia; Low Risk // Myoview 11/26/16: EF 75, apical defect c/w soft tissue attenuation, no ischemia or scar; Low Risk  . Hyperlipidemia   . Hypertension   . Hypothyroidism   . Shortness of breath   . Thyroid disease     Patient Active Problem List   Diagnosis Date Noted  . Alzheimer's dementia without behavioral disturbance 01/27/2017  . Chest pain 11/17/2015  . Dyspnea 12/06/2014    . Anxiety and depression 09/03/2014  . CN (constipation) 09/03/2014  . Essential hypertension, benign 08/28/2014  . Status post left knee replacement 08/28/2014  . Acute delirium 06/05/2013  . Hyponatremia 06/05/2013  . Acute urinary retention 06/05/2013  . Hypokalemia 06/05/2013  . Fever, unspecified 06/05/2013  . Hypothyroidism 06/05/2013  . Left knee DJD 06/02/2013    Class: Chronic  . UTI (lower urinary tract infection) 08/31/2012  . Heart murmur, aortic 07/15/2012  . Dizziness - giddy 07/15/2012  . Gait disturbance 12/21/2011  . Osteopenia 12/21/2011  . Cholelithiasis with cholecystitis without obstruction 12/21/2011  . Liver hemangioma 12/21/2011  . Pulmonary nodule, right 12/21/2011  . Breast cancer ([place]) 12/03/2011  . Edema of both legs 09/15/2011  . Thyroid disease   . Hyperlipidemia   . Arthritis   . Cancer [facility])     Past Surgical History:  Procedure Laterality Date  . ABDOMINAL HYSTERECTOMY  2008  . bladder tact    . BREAST LUMPECTOMY Right 2000  . BREAST LUMPECTOMY Left 2007  . CATARACT EXTRACTION  2011  . JOINT REPLACEMENT    . KNEE SURGERY     right  . ROTATOR CUFF REPAIR     right  . THYROID SURGERY  2002  . TOTAL KNEE ARTHROPLASTY Left 06/02/2013   Dr Rhona Raider  . TOTAL KNEE ARTHROPLASTY Right 06/02/2013   Procedure: RIGHT TOTAL KNEE ARTHROPLASTY;  Surgeon:  Hessie Dibble, MD;  Location: [place];  Service: Orthopedics;  Laterality: Right;  . TOTAL KNEE ARTHROPLASTY Left 08/22/2014   Procedure: TOTAL KNEE ARTHROPLASTY;  Surgeon: Hessie Dibble, MD;  Location: [place];  Service: Orthopedics;  Laterality: Left;    OB History    Gravida Para Term Preterm AB Living   2 2           SAB TAB Ectopic Multiple Live Births                   Home Medications    Prior to Admission medications   Medication Sig Start Date End Date Taking? Authorizing Provider  amLODipine (NORVASC) 5 MG tablet Take 1 tablet (5 mg total) by mouth daily. Patient taking  differently: Take 10 mg by mouth daily.  06/23/17   Susy Frizzle, MD  aspirin 81 MG tablet Take 81 mg by mouth daily.    [provider]  Calcium Carbonate-Vit D-Min (GNP CALCIUM 1200) 1200-1000 MG-UNIT CHEW 1 tab PO QAM 08/05/17   Susy Frizzle, MD  Calcium Carbonate-Vitamin D (CALCIUM + D) 600-200 MG-UNIT TABS Take 1 tablet by mouth daily.     [provider]  COMBIVENT RESPIMAT 20-100 MCG/ACT AERS respimat INHALE 1 PUFF INTO THE LUGNS EVERY 6 HOURS AS NEEDED FOR WHEEZING 02/02/17   Volanda Napoleon, MD  Cranberry 250 MG CAPS Take 250 mg by mouth daily.    [provider]  donepezil (ARICEPT) 5 MG tablet TAKE 1 TABLET (5 MG TOTAL) BY MOUTH AT BEDTIME. 06/12/17   Susy Frizzle, MD  exemestane (AROMASIN) 25 MG tablet TAKE 1 TABLET EVERY DAY 03/16/17   Susy Frizzle, MD  fluticasone (FLONASE) 50 MCG/ACT nasal spray Place 2 sprays into both nostrils daily. 07/21/17   Susy Frizzle, MD  folic acid (FOLVITE) 1 MG tablet TAKE 1 TABLET BY MOUTH EVERY DAY 03/18/17   Susy Frizzle, MD  hydrochlorothiazide (HYDRODIURIL) 25 MG tablet Take 1 tablet (25 mg total) by mouth daily. 07/21/17   Susy Frizzle, MD  KLOR-CON M20 20 MEQ tablet TAKE 1 TABLET BY MOUTH DAILY 11/10/16   Susy Frizzle, MD  levothyroxine (SYNTHROID, LEVOTHROID) 125 MCG tablet TAKE 1 TABLET BY MOUTH ONCE A DAY 04/24/17   Susy Frizzle, MD  LORazepam (ATIVAN) 1 MG tablet Take 1 tablet (1 mg total) by mouth 2 (two) times daily as needed for anxiety or sleep. 08/06/17   Susy Frizzle, MD  losartan (COZAAR) 100 MG tablet TAKE 1 TABLET BY MOUTH EVERY DAY 08/07/17   Susy Frizzle, MD  Multiple Vitamins-Minerals (CENTRUM SILVER ADULT 50+ PO) Take 1 tablet by mouth daily.     [provider]  nitroGLYCERIN (NITROSTAT) 0.4 MG SL tablet Place 1 tablet (0.4 mg total) under the tongue every 5 (five) minutes as needed for chest pain. 11/29/15   Richardson Dopp T, PA-C  nystatin ointment  (MYCOSTATIN) Apply 1 application topically daily.    [provider]  pantoprazole (PROTONIX) 40 MG tablet Take 1 tablet (40 mg total) by mouth as directed. 40 MG DAILY FOR 1 MONTH THEN AS NEEDED 04/03/16   Susy Frizzle, MD  rosuvastatin (CRESTOR) 10 MG tablet TAKE 1 TABLET BY MOUTH EVERY DAY AT 6PM 07/16/17   Susy Frizzle, MD  sertraline (ZOLOFT) 50 MG tablet Take 1 tablet (50 mg total) by mouth at bedtime. Patient taking differently: Take 50 mg by mouth every other day.  03/19/17   Susy Frizzle, MD  traMADol (ULTRAM) 50 MG tablet Take 1 tablet (50 mg total) by mouth every 8 (eight) hours as needed. 08/18/17   Susy Frizzle, MD  traZODone (DESYREL) 50 MG tablet Take 1 tablet (50 mg total) by mouth at bedtime as needed for sleep. 03/19/17   Susy Frizzle, MD  traZODone (DESYREL) 50 MG tablet Take 2 tablets (100 mg total) by mouth at bedtime as needed for sleep. 08/21/17   Susy Frizzle, MD    Family History Family History  Problem Relation Age of Onset  . Hypertension Mother   . Stroke Mother   . Cancer Father   . Cancer Sister        leukemia  . Cancer Brother        leukemia  . Cancer Sister        vaginal  . Cancer Sister        bladder  . Cancer Brother        prostate, skin  . Cancer Brother        colon  . Cancer Brother        prostate, bone mets  . Cancer Brother        skin  . Heart disease Sister     Social History Social History  Substance Use Topics  . Smoking status: Never Smoker  . Smokeless tobacco: Never Used     Comment: never used tobacco  . Alcohol use No     Allergies   Ambien [zolpidem]; Pyridium [phenazopyridine hcl]; and Sulfa antibiotics   Review of Systems Review of Systems  Constitutional: Negative for fever.  Respiratory: Negative for shortness of breath.   Cardiovascular: Negative for chest pain.  Gastrointestinal: Negative for abdominal pain, nausea and vomiting.  Musculoskeletal: Negative for back pain.   Neurological: Positive for headaches. Negative for dizziness, speech difficulty, weakness, light-headedness and numbness.  All other systems reviewed and are negative.    Physical Exam Updated Vital Signs BP (!) 154/65   Pulse (!) 59   Temp 99.2 F (37.3 C) (Oral)   Resp 18   Ht _0  (1.6 m)   Wt 70.8 kg (156 lb)   SpO2 96%   BMI 27.63 kg/m   Physical Exam  Constitutional: She is oriented to person, place, and time.  Elderly, nontoxic, no acute distress  HENT:  Head: Normocephalic and atraumatic.  Eyes: Pupils are equal, round, and reactive to light.  Cardiovascular: Normal rate, regular rhythm and normal heart sounds.   No murmur heard. Pulmonary/Chest: Effort normal and breath sounds normal. No respiratory distress. She has no wheezes.  Abdominal: Soft. There is no tenderness.  Neurological: She is alert and oriented to person, place, and time.  5 out of 5 strength in all 4 extremities, no dysmetria to finger-nose-finger, no drift, cranial nerves II through XII intact  Skin: Skin is warm and dry.  Psychiatric: She has a normal mood and affect.  Nursing note and vitals reviewed.    ED Treatments / Results  Labs (all labs ordered are listed, but only abnormal results are displayed) Labs Reviewed  I-STAT CHEM 8, ED - Abnormal; Notable for the following:       Result Value   Sodium 131 (*)    Potassium 3.4 (*)    Chloride 93 (*)    Calcium, Ion 1.09 (*)    All other components within normal limits    EKG  EKG Interpretation  Date/Time:  Wednesday August 26 2017 00:04:12 EDT Ventricular Rate:  59 PR Interval:  166 QRS Duration: 150 QT Interval:  486 QTC Calculation: 481 R Axis:   -19 Text Interpretation:  Sinus bradycardia Left bundle branch block Abnormal ECG No significant change since last tracing Confirmed by Thayer Jew [phone]) on 08/26/2017 12:12:16 AM       Radiology No results found.  Procedures Procedures (including critical care  time)  Medications Ordered in ED Medications  potassium chloride SA (K-DUR,KLOR-CON) CR tablet 40 mEq (not administered)     Initial Impression / Assessment and Plan / ED Course  I have reviewed the triage vital signs and the nursing notes.  Pertinent labs & imaging results that were available during my care of the patient were reviewed by me and considered in my medical decision making (see chart for details).     Patient presents with concerns for hypertension. She had head pressure at home but symptoms have currently resolved and blood pressure has down trended. She is nontoxic and neurologically intact. Doubt hypertensive urgency or emergency at this time. Screening chem 8 and EKG were obtained. EKG is unchanged from prior. Patient with mild hypokalemia. This was replaced. No indication for CT scan of the head. I have encouraged patient to continue to monitor her blood pressure daily or if symptomatic. Follow-up closely with primary physician for titration of medications and recheck. She also sees cardiology regularly who could also address blood pressure issues. She was given strict return precautions.  After history, exam, and medical workup I feel the patient has been appropriately medically screened and is safe for discharge home. Pertinent diagnoses were discussed with the patient. Patient was given return precautions.   Final Clinical Impressions(s) / ED Diagnoses   Final diagnoses:  Essential hypertension    New Prescriptions New Prescriptions   No medications on file     Merryl Hacker, MD 08/26/17 0040

## 2017-08-31 ENCOUNTER — Encounter: Payer: Self-pay | Admitting: Family Medicine

## 2017-08-31 ENCOUNTER — Ambulatory Visit (INDEPENDENT_AMBULATORY_CARE_PROVIDER_SITE_OTHER): Payer: Medicare Other | Admitting: Family Medicine

## 2017-08-31 VITALS — BP 124/60 | HR 64 | Temp 97.7°F | Resp 18 | Ht 63.0 in | Wt 154.0 lb

## 2017-08-31 DIAGNOSIS — I10 Essential (primary) hypertension: Secondary | ICD-10-CM

## 2017-08-31 DIAGNOSIS — F410 Panic disorder [episodic paroxysmal anxiety] without agoraphobia: Secondary | ICD-10-CM | POA: Diagnosis not present

## 2017-08-31 MED ORDER — LORAZEPAM 1 MG PO TABS: 1.0000 mg | ORAL_TABLET | Freq: Two times a day (BID) | ORAL | 1 refills | Status: DC | PRN

## 2017-08-31 MED ORDER — NEBIVOLOL HCL 5 MG PO TABS: 5.0000 mg | ORAL_TABLET | Freq: Every day | ORAL | 5 refills | Status: DC

## 2017-08-31 MED ORDER — SERTRALINE HCL 50 MG PO TABS: 50.0000 mg | ORAL_TABLET | Freq: Every day | ORAL | 5 refills | Status: DC
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
## 2017-09-11 ENCOUNTER — Telehealth: Payer: Self-pay | Admitting: Family Medicine

## 2017-09-11 DIAGNOSIS — F0280 Dementia in other diseases classified elsewhere without behavioral disturbance: Secondary | ICD-10-CM

## 2017-09-11 DIAGNOSIS — G309 Alzheimer's disease, unspecified: Principal | ICD-10-CM

## 2017-09-11 NOTE — Telephone Encounter (Signed)
Patients son calling to speak to someone about getting some help with his mom  Says he has call with no returned calls, please call him at [phone]

## 2017-09-11 NOTE — Telephone Encounter (Signed)
Referral placed for [facility] and called [facility] with Margaretmary Bayley informing him that referral was placed.

## 2017-09-28 ENCOUNTER — Other Ambulatory Visit: Payer: Self-pay | Admitting: Family Medicine

## 2017-09-28 NOTE — Telephone Encounter (Signed)
ok 

## 2017-09-28 NOTE — Telephone Encounter (Signed)
Ok to refill 

## 2017-09-29 NOTE — Telephone Encounter (Signed)
Medication called to pharmacy. 

## 2017-09-30 ENCOUNTER — Other Ambulatory Visit: Payer: Self-pay | Admitting: Family Medicine

## 2017-09-30 NOTE — Telephone Encounter (Signed)
Ok to refill 

## 2017-10-01 NOTE — Telephone Encounter (Signed)
Medication called to pharmacy. 

## 2017-10-01 NOTE — Telephone Encounter (Signed)
ok 

## 2017-10-22 ENCOUNTER — Telehealth: Payer: Self-pay | Admitting: Family Medicine

## 2017-10-22 NOTE — Telephone Encounter (Signed)
pts son called about wanting suggestions for assisted living facilities in [place], states that [place] is wanting to go and tour some of them. Said dr. Mamie Nick had suggested one before but doesn't remember the name.

## 2017-10-26 NOTE — Telephone Encounter (Signed)
Pt's son aware of recommendation and he has called them and they are full. Gave him a few other names he will check them out and call if we can be of further help.

## 2017-10-26 NOTE — Telephone Encounter (Signed)
[facility] in [place] was very good.

## 2017-10-30 ENCOUNTER — Other Ambulatory Visit: Payer: Self-pay | Admitting: Family Medicine

## 2017-11-17 ENCOUNTER — Other Ambulatory Visit: Payer: Self-pay | Admitting: Family Medicine

## 2017-11-26 ENCOUNTER — Other Ambulatory Visit: Payer: Self-pay | Admitting: Family Medicine

## 2017-11-26 NOTE — Telephone Encounter (Signed)
Ok to refill??  Last office visit 08/31/2017.  Last refill 10/01/2017, #1 refill.

## 2017-12-03 ENCOUNTER — Ambulatory Visit (INDEPENDENT_AMBULATORY_CARE_PROVIDER_SITE_OTHER): Payer: Medicare Other | Admitting: Family Medicine

## 2017-12-03 ENCOUNTER — Encounter: Payer: Self-pay | Admitting: Family Medicine

## 2017-12-03 VITALS — BP 160/78 | HR 68 | Temp 98.1°F | Resp 14 | Ht 62.5 in | Wt 157.0 lb

## 2017-12-03 DIAGNOSIS — N644 Mastodynia: Secondary | ICD-10-CM | POA: Diagnosis not present

## 2017-12-03 DIAGNOSIS — Z0000 Encounter for general adult medical examination without abnormal findings: Secondary | ICD-10-CM

## 2017-12-03 DIAGNOSIS — Z23 Encounter for immunization: Secondary | ICD-10-CM

## 2017-12-03 DIAGNOSIS — R5382 Chronic fatigue, unspecified: Secondary | ICD-10-CM

## 2017-12-03 DIAGNOSIS — F329 Major depressive disorder, single episode, unspecified: Secondary | ICD-10-CM

## 2017-12-03 DIAGNOSIS — I10 Essential (primary) hypertension: Secondary | ICD-10-CM

## 2017-12-03 DIAGNOSIS — G4700 Insomnia, unspecified: Secondary | ICD-10-CM

## 2017-12-03 DIAGNOSIS — M79671 Pain in right foot: Secondary | ICD-10-CM | POA: Diagnosis not present

## 2017-12-03 DIAGNOSIS — R011 Cardiac murmur, unspecified: Secondary | ICD-10-CM

## 2017-12-03 DIAGNOSIS — F419 Anxiety disorder, unspecified: Secondary | ICD-10-CM

## 2017-12-03 MED ORDER — TEMAZEPAM 15 MG PO CAPS: 15.0000 mg | ORAL_CAPSULE | Freq: Every evening | ORAL | 0 refills | Status: DC | PRN

## 2017-12-03 NOTE — Progress Notes (Signed)
Subjective:    Patient ID: Hannah Galvan, female    DOB: 1931-08-13, 84 y.o.   MRN: [id number]  HPI Patient was scheduled for complete physical exam however she presented with numerous medical complaints and concerns. Therefore unable to perform the wellness exam but instead focused on her many complaints. 1 she reports insomnia. She is able to fall asleep Ativan however she is unable to sleep more than 4 hours. This leaves her feeling extremely tired in the daytime and also tends to contribute to her headache. She states that when she doesn't sleep well, her headache is worse. She is using Ativan to help her fall asleep but it does not seem to last sufficient length of time.  She is also tried trazodone in the past with no benefit at all. Patient experienced confusion in 2015 with high-dose Ambien. Problem #2, the patient reports a month of pain in her right breast. The pain is located in the right lower quadrant laterally. She has a history of breast cancer in both breasts and even had a normal mammogram in September 2018. There is no palpable abnormality on breast exam today. There is no mass, erythema, warmth. However she is tender to palpation in that area. Problem #3 his generalized fatigue. She states that she feels weak and tired all the time with no energy. She denies any chest pain. She denies any shortness of breath. She denies any dyspnea on exertion. She denies any fevers or chills. She denies any nausea vomiting or abdominal pain. Her exam is significant only for worsening murmur which sounds like aortic stenosis heard best over the aortic valve which is now 2-3/6 despite a normal ultrasound a few years ago. She also requests lab work pertaining to her complete physical exam. Problem #4 is continued pain in her right foot. She fractured metatarsal in her right foot after a fall in [place]. She is seen orthopedics here. They placed her in a cast. They dismissed her 2 months ago stating that she  had arthritis in the foot causing her pain. However she continues to have pain and nothing that they have tried has helped her. She is interested in getting a second opinion. Past Medical History:  Diagnosis Date  . Allergy   . Anxiety   . Arthritis   . Blood transfusion without reported diagnosis   . Cancer ([place])    breast, s/p RXT, bilateral  . Cataract   . History of echocardiogram    a. Echo 8/16: Vigorous LVF, EF 10-17%, grade 1 diastolic dysfunction, trivial AI, mild MR, mild RVE, normal RV function, PASP 33 mmHg // b. Echo 1/17 EF 65-70%, normal wall motion, grade 1 diastolic dysfunction, trivial AI, MAC, mild MR // c. Echo 11/26/16: mild LVH, EF 60-65, mild AI, mild MR, mild LAE, mild to mod TR  . History of stress test    Lexiscan Myoview 8/16:  EF 81%, breast attenuation, no ischemia; Low Risk // Myoview 11/26/16: EF 75, apical defect c/w soft tissue attenuation, no ischemia or scar; Low Risk  . Hyperlipidemia   . Hypertension   . Hypothyroidism   . Shortness of breath   . Thyroid disease    Past Surgical History:  Procedure Laterality Date  . ABDOMINAL HYSTERECTOMY  2008  . bladder tact    . BREAST LUMPECTOMY Right 2000  . BREAST LUMPECTOMY Left 2007  . CATARACT EXTRACTION  2011  . JOINT REPLACEMENT    . KNEE SURGERY     right  .  ROTATOR CUFF REPAIR     right  . THYROID SURGERY  2002  . TOTAL KNEE ARTHROPLASTY Left 06/02/2013   Dr Rhona Raider  . TOTAL KNEE ARTHROPLASTY Right 06/02/2013   Procedure: RIGHT TOTAL KNEE ARTHROPLASTY;  Surgeon: Hessie Dibble, MD;  Location: [facility];  Service: Orthopedics;  Laterality: Right;  . TOTAL KNEE ARTHROPLASTY Left 08/22/2014   Procedure: TOTAL KNEE ARTHROPLASTY;  Surgeon: Hessie Dibble, MD;  Location: [facility];  Service: Orthopedics;  Laterality: Left;   Current Outpatient Medications on File Prior to Visit  Medication Sig Dispense Refill  . amLODipine (NORVASC) 5 MG tablet Take 1 tablet (5 mg total) by mouth daily. (Patient taking  differently: Take 10 mg by mouth daily. ) 90 tablet 3  . aspirin 81 MG tablet Take 81 mg by mouth daily.    . Calcium Carbonate-Vit D-Min (GNP CALCIUM 1200) 1200-1000 MG-UNIT CHEW 1 tab PO QAM 30 tablet   . Calcium Carbonate-Vitamin D (CALCIUM + D) 600-200 MG-UNIT TABS Take 1 tablet by mouth daily.     . Cranberry 250 MG CAPS Take 250 mg by mouth daily.    Marland Kitchen donepezil (ARICEPT) 5 MG tablet TAKE 1 TABLET (5 MG TOTAL) BY MOUTH AT BEDTIME. 30 tablet 5  . exemestane (AROMASIN) 25 MG tablet TAKE 1 TABLET BY MOUTH EVERY DAY 90 tablet 2  . folic acid (FOLVITE) 1 MG tablet TAKE 1 TABLET BY MOUTH EVERY DAY 90 tablet 3  . hydrochlorothiazide (HYDRODIURIL) 25 MG tablet Take 1 tablet (25 mg total) by mouth daily. 90 tablet 3  . KLOR-CON M20 20 MEQ tablet TAKE 1 TABLET BY MOUTH DAILY 90 tablet 3  . levothyroxine (SYNTHROID, LEVOTHROID) 125 MCG tablet TAKE 1 TABLET BY MOUTH ONCE A DAY 90 tablet 2  . losartan (COZAAR) 100 MG tablet TAKE 1 TABLET BY MOUTH EVERY DAY 90 tablet 0  . Multiple Vitamins-Minerals (CENTRUM SILVER ADULT 50+ PO) Take 1 tablet by mouth daily.     . nebivolol (BYSTOLIC) 5 MG tablet Take 1 tablet (5 mg total) by mouth daily. 30 tablet 5  . nitroGLYCERIN (NITROSTAT) 0.4 MG SL tablet Place 1 tablet (0.4 mg total) under the tongue every 5 (five) minutes as needed for chest pain. 25 tablet 3  . rosuvastatin (CRESTOR) 10 MG tablet TAKE 1 TABLET BY MOUTH EVERY DAY AT 6PM 90 tablet 3  . sertraline (ZOLOFT) 50 MG tablet Take 1 tablet (50 mg total) by mouth at bedtime. 90 tablet 5  . traMADol (ULTRAM) 50 MG tablet Take 1 tablet (50 mg total) by mouth every 8 (eight) hours as needed. 30 tablet 0  . COMBIVENT RESPIMAT 20-100 MCG/ACT AERS respimat INHALE 1 PUFF INTO THE LUGNS EVERY 6 HOURS AS NEEDED FOR WHEEZING (Patient not taking: Reported on 08/31/2017) 2 Inhaler 0   No current facility-administered medications on file prior to visit.    Allergies  Allergen Reactions  . Ambien [Zolpidem]      confusion  . Pyridium [Phenazopyridine Hcl] Other (See Comments)    hemolysis  . Sulfa Antibiotics Rash   Social History   Socioeconomic History  . Marital status: Widowed    Spouse name: Not on file  . Number of children: Not on file  . Years of education: Not on file  . Highest education level: Not on file  Social Needs  . Financial resource strain: Not on file  . Food insecurity - worry: Not on file  . Food insecurity - inability: Not on file  .  Transportation needs - medical: Not on file  . Transportation needs - non-medical: Not on file  Occupational History  . Not on file  Tobacco Use  . Smoking status: Never Smoker  . Smokeless tobacco: Never Used  . Tobacco comment: never used tobacco  Substance and Sexual Activity  . Alcohol use: No    Alcohol/week: 0.0 oz  . Drug use: No  . Sexual activity: Not Currently  Other Topics Concern  . Not on file  Social History Narrative  . Not on file      Review of Systems  All other systems reviewed and are negative.      Objective:   Physical Exam  Constitutional: She is oriented to person, place, and time. She appears well-developed and well-nourished. No distress.  HENT:  Head: Normocephalic and atraumatic.  Right Ear: External ear normal.  Left Ear: External ear normal.  Nose: Nose normal.  Mouth/Throat: Oropharynx is clear and moist. No oropharyngeal exudate.  Eyes: Conjunctivae and EOM are normal. Pupils are equal, round, and reactive to light. Right eye exhibits no discharge. Left eye exhibits no discharge. No scleral icterus.  Neck: Normal range of motion. Neck supple. No JVD present. No tracheal deviation present. No thyromegaly present.  Cardiovascular: Normal rate and regular rhythm.  Murmur heard. Pulmonary/Chest: Effort normal and breath sounds normal. No stridor. No respiratory distress. She has no wheezes. She has no rales. She exhibits no tenderness. Right breast exhibits tenderness. Right breast  exhibits no inverted nipple, no mass, no nipple discharge and no skin change. Left breast exhibits no inverted nipple, no mass, no nipple discharge, no skin change and no tenderness. Breasts are symmetrical.    Abdominal: Soft. Bowel sounds are normal. She exhibits no distension and no mass. There is no tenderness. There is no rebound and no guarding.  Lymphadenopathy:    She has no cervical adenopathy.  Neurological: She is alert and oriented to person, place, and time. She displays normal reflexes. No cranial nerve deficit. She exhibits normal muscle tone. Coordination normal.  Skin: Skin is warm. No rash noted. She is not diaphoretic. No erythema. No pallor.  Psychiatric: She has a normal mood and affect. Her behavior is normal. Judgment and thought content normal.  Vitals reviewed.         Assessment & Plan:  Chronic fatigue - Plan: CBC with Differential/Platelet, COMPLETE METABOLIC PANEL WITH GFR, TSH, Urinalysis, Routine w reflex microscopic  Benign essential HTN - Plan: Lipid panel  Breast pain, right - Plan: MM Digital Diagnostic Unilat R  Insomnia, unspecified type  Anxiety and depression  General medical exam  Right foot pain - Plan: Ambulatory referral to Podiatry  I believe chronic fatigue is related to age coupled with most likely aortic stenosis coupled with depression coupled with insomnia. I will check a CBC, CMP, TSH, and urinalysis to rule out other underlying causes of fatigue. Her blood pressures elevated today however I'm concerned that she has aortic stenosis. I would like to recheck an echocardiogram as I feel that the aortic stenosis may be contributing some to her fatigue and I'm afraid to decrease her blood pressure further if she has aortic stenosis due to the potential risk for syncope. I will obtain a diagnostic mammogram of the right breast however there is no palpable abnormality on exam. I will discontinue Ativan and switch to low-dose temazepam due to  its slightly longer half-life to see if this facilitates better sleep. We will need to wait out against  the risk of confusion and falls. However she continues to complain of insomnia and now she believes the insomnia is contributing to her headache cycle believe the risk is worth the benefit. I will consult podiatry regarding her chronic right foot pain.

## 2017-12-03 NOTE — Addendum Note (Signed)
Addended by: Shary Decamp B on: 12/03/2017 04:07 PM   Modules accepted: Orders

## 2017-12-04 ENCOUNTER — Encounter: Payer: Self-pay | Admitting: Family Medicine

## 2017-12-04 ENCOUNTER — Telehealth: Payer: Self-pay

## 2017-12-04 LAB — COMPLETE METABOLIC PANEL WITH GFR
AG Ratio: 2.1 (calc) (ref 1.0–2.5)
ALBUMIN MSPROF: 4.5 g/dL (ref 3.6–5.1)
ALT: 21 U/L (ref 6–29)
AST: 27 U/L (ref 10–35)
Alkaline phosphatase (APISO): 41 U/L (ref 33–130)
BUN: 15 mg/dL (ref 7–25)
CO2: 28 mmol/L (ref 20–32)
CREATININE: 0.77 mg/dL (ref 0.60–0.88)
Calcium: 9.8 mg/dL (ref 8.6–10.4)
Chloride: 99 mmol/L (ref 98–110)
GFR, EST AFRICAN AMERICAN: 82 mL/min/{1.73_m2} (ref >=60)
GFR, EST NON AFRICAN AMERICAN: 71 mL/min/{1.73_m2} (ref >=60)
GLUCOSE: 82 mg/dL (ref 65–99)
Globulin: 2.1 g/dL (calc) (ref 1.9–3.7)
Potassium: 4.3 mmol/L (ref 3.5–5.3)
Sodium: 136 mmol/L (ref 135–146)
TOTAL PROTEIN: 6.6 g/dL (ref 6.1–8.1)
Total Bilirubin: 0.4 mg/dL (ref 0.2–1.2)

## 2017-12-04 LAB — CBC WITH DIFFERENTIAL/PLATELET
BASOS PCT: 0.8 %
Basophils Absolute: 60 cells/uL (ref 0–200)
EOS PCT: 1.2 %
Eosinophils Absolute: 90 cells/uL (ref 15–500)
HEMATOCRIT: 40.7 % (ref 35.0–45.0)
HEMOGLOBIN: 14 g/dL (ref 11.7–15.5)
LYMPHS ABS: 2115 {cells}/uL (ref 850–3900)
MCH: 33 pg (ref 27.0–33.0)
MCHC: 34.4 g/dL (ref 32.0–36.0)
MCV: 96 fL (ref 80.0–100.0)
MONOS PCT: 9.3 %
MPV: 10.4 fL (ref 7.5–12.5)
NEUTROS ABS: 4538 {cells}/uL (ref 1500–7800)
Neutrophils Relative %: 60.5 %
Platelets: 265 10*3/uL (ref 140–400)
RBC: 4.24 10*6/uL (ref 3.80–5.10)
RDW: 12.7 % (ref 11.0–15.0)
Total Lymphocyte: 28.2 %
WBC mixed population: 698 cells/uL (ref 200–950)
WBC: 7.5 10*3/uL (ref 3.8–10.8)

## 2017-12-04 LAB — LIPID PANEL
CHOL/HDL RATIO: 2.9 (calc) (ref <5.0)
Cholesterol: 159 mg/dL (ref <200)
HDL: 54 mg/dL (ref >50)
LDL CHOLESTEROL (CALC): 81 mg/dL
NON-HDL CHOLESTEROL (CALC): 105 mg/dL (ref <130)
TRIGLYCERIDES: 142 mg/dL (ref <150)

## 2017-12-04 LAB — URINALYSIS, ROUTINE W REFLEX MICROSCOPIC
BILIRUBIN URINE: NEGATIVE
Glucose, UA: NEGATIVE
HGB URINE DIPSTICK: NEGATIVE
KETONES UR: NEGATIVE
Leukocytes, UA: NEGATIVE
Nitrite: NEGATIVE
PH: 7 (ref 5.0–8.0)
Protein, ur: NEGATIVE
SPECIFIC GRAVITY, URINE: 1.015 (ref 1.001–1.03)

## 2017-12-04 LAB — TSH: TSH: 2.06 mIU/L (ref 0.40–4.50)

## 2017-12-04 MED ORDER — LORAZEPAM 1 MG PO TABS: 1.0000 mg | ORAL_TABLET | Freq: Every day | ORAL | 1 refills | Status: DC

## 2017-12-04 NOTE — Telephone Encounter (Signed)
Discontinue temazepam and resume the ativan that she was taking.

## 2017-12-04 NOTE — Telephone Encounter (Signed)
Patient called about problems she is experiencing with a medication she started 12/03/2017 at 10:30pm. Patient states the temazepam is causing her to feel  Nervous and jittery, and also causing breathing problems,  Patient denies any dizziness, patient denies having chest pains, patient denies facial swelling, no shortness of breath patient states its more so of an anxious feeling.

## 2017-12-04 NOTE — Telephone Encounter (Signed)
Call placed to patient to resume ativan and d/c temazepam. Patient verbalizes understanding

## 2017-12-09 ENCOUNTER — Other Ambulatory Visit: Payer: Self-pay | Admitting: Family Medicine

## 2017-12-09 DIAGNOSIS — N644 Mastodynia: Secondary | ICD-10-CM

## 2017-12-11 ENCOUNTER — Telehealth: Payer: Self-pay | Admitting: Podiatry

## 2017-12-11 ENCOUNTER — Ambulatory Visit (HOSPITAL_COMMUNITY): Payer: Medicare Other | Attending: Cardiology

## 2017-12-11 ENCOUNTER — Other Ambulatory Visit: Payer: Self-pay

## 2017-12-11 DIAGNOSIS — I10 Essential (primary) hypertension: Secondary | ICD-10-CM | POA: Diagnosis not present

## 2017-12-11 DIAGNOSIS — E785 Hyperlipidemia, unspecified: Secondary | ICD-10-CM | POA: Insufficient documentation

## 2017-12-11 DIAGNOSIS — R011 Cardiac murmur, unspecified: Secondary | ICD-10-CM | POA: Diagnosis not present

## 2017-12-11 DIAGNOSIS — I340 Nonrheumatic mitral (valve) insufficiency: Secondary | ICD-10-CM | POA: Insufficient documentation

## 2017-12-11 NOTE — Telephone Encounter (Signed)
I called Hannah Galvan and let him know I would be happy to help. Hannah Galvan stated he is out of town and would not be back in town to have his mother sign the release form until Monday evening and wanted to know if that would be okay. I told him it would and I would have the information on how he can return the completed and signed release form back to me.

## 2017-12-11 NOTE — Telephone Encounter (Signed)
I was calling to see if I can get a medical records release form to send to Dr. Jerald Kief office for them to release her records to your office. If she signs a form with them they will charge her $10.00 where if she signs a form with your office and you request them they will be free. She has an appointment to see Dr. Paulla Dolly next Thursday. So if you guys can furnish that I would appreciate that. You can send it to my e-mail [email or web address]. My phone number is [phone] and I would appreciate a call to let me know if there is any problem with this. Thank you.

## 2017-12-15 ENCOUNTER — Encounter: Payer: Self-pay | Admitting: Family Medicine

## 2017-12-15 ENCOUNTER — Ambulatory Visit (INDEPENDENT_AMBULATORY_CARE_PROVIDER_SITE_OTHER): Payer: Medicare Other | Admitting: Family Medicine

## 2017-12-15 VITALS — BP 136/68 | HR 68 | Temp 98.0°F | Resp 16 | Ht 62.5 in | Wt 155.0 lb

## 2017-12-15 DIAGNOSIS — I421 Obstructive hypertrophic cardiomyopathy: Secondary | ICD-10-CM | POA: Diagnosis not present

## 2017-12-15 NOTE — Progress Notes (Signed)
Subjective:    Patient ID: Hannah Galvan, female    DOB: [birth year], 84 y.o.   MRN: [id number]  HPI 12/03/17 Patient was scheduled for complete physical exam however she presented with numerous medical complaints and concerns. Therefore unable to perform the wellness exam but instead focused on her many complaints. 1 she reports insomnia. She is able to fall asleep Ativan however she is unable to sleep more than 4 hours. This leaves her feeling extremely tired in the daytime and also tends to contribute to her headache. She states that when she doesn't sleep well, her headache is worse. She is using Ativan to help her fall asleep but it does not seem to last sufficient length of time.  She is also tried trazodone in the past with no benefit at all. Patient experienced confusion in 2015 with high-dose Ambien. Problem #2, the patient reports a month of pain in her right breast. The pain is located in the right lower quadrant laterally. She has a history of breast cancer in both breasts and even had a normal mammogram in September 2018. There is no palpable abnormality on breast exam today. There is no mass, erythema, warmth. However she is tender to palpation in that area. Problem #3 his generalized fatigue. She states that she feels weak and tired all the time with no energy. She denies any chest pain. She denies any shortness of breath. She denies any dyspnea on exertion. She denies any fevers or chills. She denies any nausea vomiting or abdominal pain. Her exam is significant only for worsening murmur which sounds like aortic stenosis heard best over the aortic valve which is now 2-3/6 despite a normal ultrasound a few years ago. She also requests lab work pertaining to her complete physical exam. Problem #4 is continued pain in her right foot. She fractured metatarsal in her right foot after a fall in [place]. She is seen orthopedics here. They placed her in a cast. They dismissed her 2 months ago stating  that she had arthritis in the foot causing her pain. However she continues to have pain and nothing that they have tried has helped her. She is interested in getting a second opinion.  At that time, my plan was: I believe chronic fatigue is related to age coupled with most likely aortic stenosis coupled with depression coupled with insomnia. I will check a CBC, CMP, TSH, and urinalysis to rule out other underlying causes of fatigue. Her blood pressures elevated today however I'm concerned that she has aortic stenosis. I would like to recheck an echocardiogram as I feel that the aortic stenosis may be contributing some to her fatigue and I'm afraid to decrease her blood pressure further if she has aortic stenosis due to the potential risk for syncope. I will obtain a diagnostic mammogram of the right breast however there is no palpable abnormality on exam. I will discontinue Ativan and switch to low-dose temazepam due to its slightly longer half-life to see if this facilitates better sleep. We will need to wait out against the risk of confusion and falls. However she continues to complain of insomnia and now she believes the insomnia is contributing to her headache cycle believe the risk is worth the benefit. I will consult podiatry regarding her chronic right foot pain.   12/15/17 Echocardiogram revealed:  - Left ventricle: The cavity size was normal. Wall thickness was   increased in a pattern of mild LVH. There was mild focal basal   hypertrophy  of the septum. Systolic function was normal. The   estimated ejection fraction was in the range of 55% to 60%. There   was dynamic obstruction at restin the outflow tract, with a peak   velocity of 621 cm/sec and a peak gradient of 154 mm Hg. Wall   motion was normal; there were no regional wall motion   abnormalities. Doppler parameters are consistent with abnormal   left ventricular relaxation (grade 1 diastolic dysfunction).   Doppler parameters are  consistent with high ventricular filling   pressure. - Aortic valve: There was trivial regurgitation. - Mitral valve: Calcified annulus. Mildly thickened leaflets .   There was severe systolic anterior motion of the chordal   structures. There was mild regurgitation. - Pulmonary arteries: Systolic pressure was mildly increased. PA   peak pressure: 32 mm Hg (S).  Impressions:  - Normal LV systolic function; mild diastolic dysfunction; mild LVH   with proximal septal thickening; systolic anterior motion of   mitral valve with severely elevated LVOT gradient (approximately   6 m/s); mild MR; mild TR with mildly elevated pulmonary pressure;   findings c/w HOCM physiology.  I asked the patient to come in today to discuss. She denies any chest pain. She denies any shortness of breath. She states that her fatigue is better. She is here today with her son. She was unable to tolerate temazepam. She tried 1 dose and it caused a severe anxiety reaction. Therefore I recommended that she resume her previous dose of Ativan. She is not taking Zoloft on a daily basis. I did recommend that she begin taking the Zoloft every day as I believe that would help manage her anxiety better as I believe the majority of her symptoms are anxiety related. Past Medical History:  Diagnosis Date  . Allergy   . Anxiety   . Arthritis   . Blood transfusion without reported diagnosis   . Cancer ([place])    breast, s/p RXT, bilateral  . Cataract   . History of echocardiogram    a. Echo 8/16: Vigorous LVF, EF 22-97%, grade 1 diastolic dysfunction, trivial AI, mild MR, mild RVE, normal RV function, PASP 33 mmHg // b. Echo 1/17 EF 65-70%, normal wall motion, grade 1 diastolic dysfunction, trivial AI, MAC, mild MR // c. Echo 11/26/16: mild LVH, EF 60-65, mild AI, mild MR, mild LAE, mild to mod TR  . History of stress test    Lexiscan Myoview 8/16:  EF 81%, breast attenuation, no ischemia; Low Risk // Myoview 11/26/16: EF 75, apical  defect c/w soft tissue attenuation, no ischemia or scar; Low Risk  . HOCM (hypertrophic obstructive cardiomyopathy) ([place])   . Hyperlipidemia   . Hypertension   . Hypothyroidism   . Shortness of breath   . Thyroid disease    Past Surgical History:  Procedure Laterality Date  . ABDOMINAL HYSTERECTOMY  2008  . bladder tact    . BREAST LUMPECTOMY Right 2000  . BREAST LUMPECTOMY Left 2007  . CATARACT EXTRACTION  2011  . JOINT REPLACEMENT    . KNEE SURGERY     right  . ROTATOR CUFF REPAIR     right  . THYROID SURGERY  2002  . TOTAL KNEE ARTHROPLASTY Left 06/02/2013   Dr Rhona Raider  . TOTAL KNEE ARTHROPLASTY Right 06/02/2013   Procedure: RIGHT TOTAL KNEE ARTHROPLASTY;  Surgeon: Hessie Dibble, MD;  Location: [facility];  Service: Orthopedics;  Laterality: Right;  . TOTAL KNEE ARTHROPLASTY Left 08/22/2014   Procedure:  TOTAL KNEE ARTHROPLASTY;  Surgeon: Hessie Dibble, MD;  Location: [facility];  Service: Orthopedics;  Laterality: Left;   Current Outpatient Medications on File Prior to Visit  Medication Sig Dispense Refill  . amLODipine (NORVASC) 5 MG tablet Take 1 tablet (5 mg total) by mouth daily. (Patient taking differently: Take 10 mg by mouth daily. ) 90 tablet 3  . aspirin 81 MG tablet Take 81 mg by mouth daily.    . Calcium Carbonate-Vit D-Min (GNP CALCIUM 1200) 1200-1000 MG-UNIT CHEW 1 tab PO QAM 30 tablet   . Calcium Carbonate-Vitamin D (CALCIUM + D) 600-200 MG-UNIT TABS Take 1 tablet by mouth daily.     . Cranberry 250 MG CAPS Take 250 mg by mouth daily.    Marland Kitchen donepezil (ARICEPT) 5 MG tablet TAKE 1 TABLET (5 MG TOTAL) BY MOUTH AT BEDTIME. 30 tablet 5  . exemestane (AROMASIN) 25 MG tablet TAKE 1 TABLET BY MOUTH EVERY DAY 90 tablet 2  . folic acid (FOLVITE) 1 MG tablet TAKE 1 TABLET BY MOUTH EVERY DAY 90 tablet 3  . hydrochlorothiazide (HYDRODIURIL) 25 MG tablet Take 1 tablet (25 mg total) by mouth daily. 90 tablet 3  . KLOR-CON M20 20 MEQ tablet TAKE 1 TABLET BY MOUTH DAILY 90 tablet 3    . levothyroxine (SYNTHROID, LEVOTHROID) 125 MCG tablet TAKE 1 TABLET BY MOUTH ONCE A DAY 90 tablet 2  . LORazepam (ATIVAN) 1 MG tablet Take 1 tablet (1 mg total) by mouth at bedtime. 30 tablet 1  . losartan (COZAAR) 100 MG tablet TAKE 1 TABLET BY MOUTH EVERY DAY 90 tablet 0  . Multiple Vitamins-Minerals (CENTRUM SILVER ADULT 50+ PO) Take 1 tablet by mouth daily.     . nebivolol (BYSTOLIC) 5 MG tablet Take 1 tablet (5 mg total) by mouth daily. 30 tablet 5  . nitroGLYCERIN (NITROSTAT) 0.4 MG SL tablet Place 1 tablet (0.4 mg total) under the tongue every 5 (five) minutes as needed for chest pain. 25 tablet 3  . rosuvastatin (CRESTOR) 10 MG tablet TAKE 1 TABLET BY MOUTH EVERY DAY AT 6PM 90 tablet 3  . sertraline (ZOLOFT) 50 MG tablet Take 1 tablet (50 mg total) by mouth at bedtime. 90 tablet 5  . traMADol (ULTRAM) 50 MG tablet Take 1 tablet (50 mg total) by mouth every 8 (eight) hours as needed. 30 tablet 0   No current facility-administered medications on file prior to visit.    Allergies  Allergen Reactions  . Ambien [Zolpidem]     confusion  . Pyridium [Phenazopyridine Hcl] Other (See Comments)    hemolysis  . Sulfa Antibiotics Rash   Social History   Socioeconomic History  . Marital status: Widowed    Spouse name: Not on file  . Number of children: Not on file  . Years of education: Not on file  . Highest education level: Not on file  Social Needs  . Financial resource strain: Not on file  . Food insecurity - worry: Not on file  . Food insecurity - inability: Not on file  . Transportation needs - medical: Not on file  . Transportation needs - non-medical: Not on file  Occupational History  . Not on file  Tobacco Use  . Smoking status: Never Smoker  . Smokeless tobacco: Never Used  . Tobacco comment: never used tobacco  Substance and Sexual Activity  . Alcohol use: No    Alcohol/week: 0.0 oz  . Drug use: No  . Sexual activity: Not Currently  Other Topics Concern  . Not  on file  Social History Narrative  . Not on file      Review of Systems  All other systems reviewed and are negative.      Objective:   Physical Exam  Constitutional: She is oriented to person, place, and time. She appears well-developed and well-nourished. No distress.  Cardiovascular: Normal rate and regular rhythm.  Murmur heard. Pulmonary/Chest: Effort normal and breath sounds normal. No stridor. No respiratory distress. She has no wheezes. She has no rales. She exhibits no tenderness.  Vitals reviewed.         Assessment & Plan:  HOCM (hypertrophic obstructive cardiomyopathy) ([place]) Spent more than 20 minutes with the patient and her son reviewing the results of echocardiogram. I explained to the patient that this may contribute to her shortness of breath or dyspnea on exertion. I recommended that she avoid strenuous activity. I recommended that she pays herself with any activity that she performs. I would not aggressively reduce preload. I believe her blood pressure is more than adequately controlled today at 136/68. I answered any questions that she'll her son had regarding the diagnosis. I believe this certainly accounts for the murmur that she is experiencing. If she were to develop chest pain or syncope or palpitations, I will have her see her cardiologist but at the present time continue her current medical management. I did recommend that she increase her Zoloft to once a day rather than every other day because of still believe the majority of her insomnia and other symptoms are due to uncontrolled anxiety

## 2017-12-16 ENCOUNTER — Other Ambulatory Visit: Payer: Medicare Other

## 2017-12-17 ENCOUNTER — Encounter: Payer: Self-pay | Admitting: Podiatry

## 2017-12-17 ENCOUNTER — Other Ambulatory Visit: Payer: Self-pay | Admitting: Podiatry

## 2017-12-17 ENCOUNTER — Ambulatory Visit (INDEPENDENT_AMBULATORY_CARE_PROVIDER_SITE_OTHER): Payer: Medicare Other

## 2017-12-17 ENCOUNTER — Ambulatory Visit (INDEPENDENT_AMBULATORY_CARE_PROVIDER_SITE_OTHER): Payer: Medicare Other | Admitting: Podiatry

## 2017-12-17 DIAGNOSIS — M79674 Pain in right toe(s): Secondary | ICD-10-CM | POA: Diagnosis not present

## 2017-12-17 DIAGNOSIS — S92311A Displaced fracture of first metatarsal bone, right foot, initial encounter for closed fracture: Secondary | ICD-10-CM

## 2017-12-17 DIAGNOSIS — M79672 Pain in left foot: Secondary | ICD-10-CM | POA: Diagnosis not present

## 2017-12-17 DIAGNOSIS — M79675 Pain in left toe(s): Secondary | ICD-10-CM | POA: Diagnosis not present

## 2017-12-17 DIAGNOSIS — M779 Enthesopathy, unspecified: Secondary | ICD-10-CM

## 2017-12-17 DIAGNOSIS — M76821 Posterior tibial tendinitis, right leg: Secondary | ICD-10-CM | POA: Diagnosis not present

## 2017-12-17 DIAGNOSIS — B351 Tinea unguium: Secondary | ICD-10-CM | POA: Diagnosis not present

## 2017-12-17 MED ORDER — TRIAMCINOLONE ACETONIDE 10 MG/ML IJ SUSP
10.0000 mg | Freq: Once | INTRAMUSCULAR | Status: AC
  Administered 2017-12-17: 10 mg

## 2017-12-17 NOTE — Progress Notes (Signed)
Subjective:   Patient ID: Hannah Galvan, female   DOB: 84 y.o.   MRN: [id number]   HPI Patient presents with caregiver with 92-month history of trauma to the right foot with progressive pain and flattening of the arch.  States that she was given a diagnosis of fracture and she is tried to wear a soft orthotic which did not help her and she was in a boot previously and surgical shoe.   ROS      Objective:  Physical Exam  Neurovascular status found to be intact with patient having midfoot pain right collapsed medial longitudinal arch right pain around the first metatarsal cuneiform joint right and into the navicular posterior tibial insertion right.  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  Nail disease 1-5 both feet that are thick and painful and make it hard to walk     Plan:  H&P condition reviewed and discussed with chronic flattening of the arch she is experiencing with her inability to be ambulatory.  I do think the best chance we have long-term is an AFO walker and we will have her see the ped orthotist to be evaluated.  Today injected around the midtarsal joint right and first metatarsal cuneiform joint and applied fascial brace to lift the arch of try to take pressure off the arch and also debrided nailbeds 1-5 both feet with no iatrogenic bleeding.  Patient understands that there is a Lisfranc's dislocation ultimately this may require surgical we are trying everything we can to avoid surgery  X-ray indicates there is a Lisfranc's displacement present with arthritic changes which is difficult to say if it is acute or chronic

## 2017-12-22 ENCOUNTER — Other Ambulatory Visit: Payer: Medicare Other | Admitting: Orthotics

## 2017-12-24 ENCOUNTER — Ambulatory Visit: Payer: Medicare Other | Admitting: Orthotics

## 2017-12-24 DIAGNOSIS — M76821 Posterior tibial tendinitis, right leg: Secondary | ICD-10-CM

## 2017-12-24 DIAGNOSIS — M79672 Pain in left foot: Secondary | ICD-10-CM

## 2017-12-24 DIAGNOSIS — M779 Enthesopathy, unspecified: Secondary | ICD-10-CM

## 2017-12-24 NOTE — Progress Notes (Signed)
Patient presents today for evaluation/casting for AFO brace (L).   Patient has hx of the following conditions: Gait instability,  Ankle instabilty,  Gait analysis done and patient displays abnormality of gait in both sagittial and frontal planes, and could benefit in aggressive ankle support.  Patient chose sand [place] brace w/ velcro laces.

## 2017-12-25 ENCOUNTER — Other Ambulatory Visit: Payer: Self-pay | Admitting: Family Medicine

## 2018-01-19 ENCOUNTER — Ambulatory Visit (INDEPENDENT_AMBULATORY_CARE_PROVIDER_SITE_OTHER): Payer: Medicare Other | Admitting: Orthotics

## 2018-01-19 DIAGNOSIS — L03031 Cellulitis of right toe: Secondary | ICD-10-CM | POA: Diagnosis not present

## 2018-01-19 DIAGNOSIS — M79672 Pain in left foot: Secondary | ICD-10-CM

## 2018-01-19 DIAGNOSIS — M76821 Posterior tibial tendinitis, right leg: Secondary | ICD-10-CM | POA: Diagnosis not present

## 2018-01-19 DIAGNOSIS — S92311A Displaced fracture of first metatarsal bone, right foot, initial encounter for closed fracture: Secondary | ICD-10-CM

## 2018-01-19 NOTE — Progress Notes (Signed)
Patient came in today to pick up standard Afo brace.  Patient was evaluated for fit and function.   The brace fit very well and there were any complaints of the way it felt once donned.  The brace offered ankle stability in both saggital and coroneal planes.  Patient advised to always wear proper fitting shoes with brace.  MBB shoe ordered to better accommodate brace.

## 2018-02-01 ENCOUNTER — Other Ambulatory Visit: Payer: Self-pay | Admitting: Family Medicine

## 2018-02-01 NOTE — Telephone Encounter (Signed)
Ok to refill??  Last office visit 12/15/2017.  Last refill 12/04/2017, #1 refill.

## 2018-02-04 ENCOUNTER — Other Ambulatory Visit: Payer: Self-pay | Admitting: Family Medicine

## 2018-02-04 ENCOUNTER — Ambulatory Visit: Payer: Medicare Other | Admitting: Family Medicine

## 2018-02-09 ENCOUNTER — Ambulatory Visit: Payer: Medicare Other | Admitting: Orthotics

## 2018-02-09 DIAGNOSIS — M79605 Pain in left leg: Secondary | ICD-10-CM

## 2018-02-09 DIAGNOSIS — M76821 Posterior tibial tendinitis, right leg: Secondary | ICD-10-CM

## 2018-02-09 DIAGNOSIS — M79604 Pain in right leg: Secondary | ICD-10-CM

## 2018-02-09 NOTE — Progress Notes (Signed)
Picked up MBS to go with [place] brace.  Both brace and shoe were a good fit.

## 2018-02-11 ENCOUNTER — Telehealth: Payer: Self-pay | Admitting: Family Medicine

## 2018-02-11 NOTE — Telephone Encounter (Signed)
Looks like we refilled it on 02/01/18. Called and spoke to Gilbert at [facility] and this had not been filled. She will get it ready for her and pt called and informed about med.

## 2018-02-11 NOTE — Telephone Encounter (Signed)
Patient is calling to get refill of her lorazepam  To be sent to [facility] if possible  [phone]

## 2018-02-16 ENCOUNTER — Ambulatory Visit (INDEPENDENT_AMBULATORY_CARE_PROVIDER_SITE_OTHER): Payer: Medicare Other | Admitting: Family Medicine

## 2018-02-16 ENCOUNTER — Encounter: Payer: Self-pay | Admitting: Family Medicine

## 2018-02-16 VITALS — BP 164/80 | HR 72 | Temp 97.6°F | Resp 16 | Ht 62.5 in | Wt 156.0 lb

## 2018-02-16 DIAGNOSIS — G4700 Insomnia, unspecified: Secondary | ICD-10-CM | POA: Diagnosis not present

## 2018-02-16 DIAGNOSIS — R300 Dysuria: Secondary | ICD-10-CM

## 2018-02-16 DIAGNOSIS — R21 Rash and other nonspecific skin eruption: Secondary | ICD-10-CM

## 2018-02-16 LAB — URINALYSIS, ROUTINE W REFLEX MICROSCOPIC
BILIRUBIN URINE: NEGATIVE
Glucose, UA: NEGATIVE
Hgb urine dipstick: NEGATIVE
Ketones, ur: NEGATIVE
LEUKOCYTES UA: NEGATIVE
Nitrite: NEGATIVE
PROTEIN: NEGATIVE
SPECIFIC GRAVITY, URINE: 1.015 (ref 1.001–1.03)
pH: 7 (ref 5.0–8.0)

## 2018-02-16 MED ORDER — SERTRALINE HCL 100 MG PO TABS: 100.0000 mg | ORAL_TABLET | Freq: Every day | ORAL | 3 refills | Status: DC

## 2018-02-16 MED ORDER — TRIAMCINOLONE ACETONIDE 0.1 % EX CREA: 1.0000 "application " | TOPICAL_CREAM | Freq: Two times a day (BID) | CUTANEOUS | 0 refills | Status: AC

## 2018-02-16 NOTE — Progress Notes (Signed)
Subjective:    Patient ID: Hannah Galvan, female    DOB: 1931-03-15, 84 y.o.   MRN: [id number]  HPI 12/03/17 Patient was scheduled for complete physical exam however she presented with numerous medical complaints and concerns. Therefore unable to perform the wellness exam but instead focused on her many complaints. 1 she reports insomnia. She is able to fall asleep Ativan however she is unable to sleep more than 4 hours. This leaves her feeling extremely tired in the daytime and also tends to contribute to her headache. She states that when she doesn't sleep well, her headache is worse. She is using Ativan to help her fall asleep but it does not seem to last sufficient length of time.  She is also tried trazodone in the past with no benefit at all. Patient experienced confusion in 2015 with high-dose Ambien. Problem #2, the patient reports a month of pain in her right breast. The pain is located in the right lower quadrant laterally. She has a history of breast cancer in both breasts and even had a normal mammogram in September 2018. There is no palpable abnormality on breast exam today. There is no mass, erythema, warmth. However she is tender to palpation in that area. Problem #3 his generalized fatigue. She states that she feels weak and tired all the time with no energy. She denies any chest pain. She denies any shortness of breath. She denies any dyspnea on exertion. She denies any fevers or chills. She denies any nausea vomiting or abdominal pain. Her exam is significant only for worsening murmur which sounds like aortic stenosis heard best over the aortic valve which is now 2-3/6 despite a normal ultrasound a few years ago. She also requests lab work pertaining to her complete physical exam. Problem #4 is continued pain in her right foot. She fractured metatarsal in her right foot after a fall in [place]. She is seen orthopedics here. They placed her in a cast. They dismissed her 2 months ago stating  that she had arthritis in the foot causing her pain. However she continues to have pain and nothing that they have tried has helped her. She is interested in getting a second opinion.  At that time, my plan was: I believe chronic fatigue is related to age coupled with most likely aortic stenosis coupled with depression coupled with insomnia. I will check a CBC, CMP, TSH, and urinalysis to rule out other underlying causes of fatigue. Her blood pressures elevated today however I'm concerned that she has aortic stenosis. I would like to recheck an echocardiogram as I feel that the aortic stenosis may be contributing some to her fatigue and I'm afraid to decrease her blood pressure further if she has aortic stenosis due to the potential risk for syncope. I will obtain a diagnostic mammogram of the right breast however there is no palpable abnormality on exam. I will discontinue Ativan and switch to low-dose temazepam due to its slightly longer half-life to see if this facilitates better sleep. We will need to wait out against the risk of confusion and falls. However she continues to complain of insomnia and now she believes the insomnia is contributing to her headache cycle believe the risk is worth the benefit. I will consult podiatry regarding her chronic right foot pain.   12/15/17 Echocardiogram revealed:  - Left ventricle: The cavity size was normal. Wall thickness was   increased in a pattern of mild LVH. There was mild focal basal   hypertrophy  of the septum. Systolic function was normal. The   estimated ejection fraction was in the range of 55% to 60%. There   was dynamic obstruction at restin the outflow tract, with a peak   velocity of 621 cm/sec and a peak gradient of 154 mm Hg. Wall   motion was normal; there were no regional wall motion   abnormalities. Doppler parameters are consistent with abnormal   left ventricular relaxation (grade 1 diastolic dysfunction).   Doppler parameters are  consistent with high ventricular filling   pressure. - Aortic valve: There was trivial regurgitation. - Mitral valve: Calcified annulus. Mildly thickened leaflets .   There was severe systolic anterior motion of the chordal   structures. There was mild regurgitation. - Pulmonary arteries: Systolic pressure was mildly increased. PA   peak pressure: 32 mm Hg (S).  Impressions:  - Normal LV systolic function; mild diastolic dysfunction; mild LVH   with proximal septal thickening; systolic anterior motion of   mitral valve with severely elevated LVOT gradient (approximately   6 m/s); mild MR; mild TR with mildly elevated pulmonary pressure;   findings c/w HOCM physiology.  I asked the patient to come in today to discuss. She denies any chest pain. She denies any shortness of breath. She states that her fatigue is better. She is here today with her son. She was unable to tolerate temazepam. She tried 1 dose and it caused a severe anxiety reaction. Therefore I recommended that she resume her previous dose of Ativan. She is not taking Zoloft on a daily basis. I did recommend that she begin taking the Zoloft every day as I believe that would help manage her anxiety better as I believe the majority of her symptoms are anxiety related.  At that time, my plan was: Spent more than 20 minutes with the patient and her son reviewing the results of echocardiogram. I explained to the patient that this may contribute to her shortness of breath or dyspnea on exertion. I recommended that she avoid strenuous activity. I recommended that she pays herself with any activity that she performs. I would not aggressively reduce preload. I believe her blood pressure is more than adequately controlled today at 136/68. I answered any questions that she'll her son had regarding the diagnosis. I believe this certainly accounts for the murmur that she is experiencing. If she were to develop chest pain or syncope or palpitations,  I will have her see her cardiologist but at the present time continue her current medical management. I did recommend that she increase her Zoloft to once a day rather than every other day because of still believe the majority of her insomnia and other symptoms are due to uncontrolled anxiety  02/16/18 Patient presents today with similar problems.  She continues to complain of trouble sleeping.  She states that she is unable to get a full night sleep.  She previously had an extremely negative reaction to temazepam.  I am hesitant to put her on any other sleeping pills given her age and previous reactions.  She states that she feels anxious most days.  She believes her nerves are causing the majority of her problems sleeping.  She is taking Zoloft 50 mg a day.  She also reports an itchy rash on the dorsal surface of both hands.  There are erythematous patches of white scale on the dorsal surfaces of both hands consistent with an atopic dermatitis.  There are no vesicular lesions.  There is no ringworm.  There is no evidence of infection.  She also reports increasing urinary frequency and would like to have a urine sample checked. Past Medical History:  Diagnosis Date  . Allergy   . Anxiety   . Arthritis   . Blood transfusion without reported diagnosis   . Cancer ([place])    breast, s/p RXT, bilateral  . Cataract   . History of echocardiogram    a. Echo 8/16: Vigorous LVF, EF 01-02%, grade 1 diastolic dysfunction, trivial AI, mild MR, mild RVE, normal RV function, PASP 33 mmHg // b. Echo 1/17 EF 65-70%, normal wall motion, grade 1 diastolic dysfunction, trivial AI, MAC, mild MR // c. Echo 11/26/16: mild LVH, EF 60-65, mild AI, mild MR, mild LAE, mild to mod TR  . History of stress test    Lexiscan Myoview 8/16:  EF 81%, breast attenuation, no ischemia; Low Risk // Myoview 11/26/16: EF 75, apical defect c/w soft tissue attenuation, no ischemia or scar; Low Risk  . HOCM (hypertrophic obstructive cardiomyopathy)  ([place])   . Hyperlipidemia   . Hypertension   . Hypothyroidism   . Shortness of breath   . Thyroid disease    Past Surgical History:  Procedure Laterality Date  . ABDOMINAL HYSTERECTOMY  2008  . bladder tact    . BREAST LUMPECTOMY Right 2000  . BREAST LUMPECTOMY Left 2007  . CATARACT EXTRACTION  2011  . JOINT REPLACEMENT    . KNEE SURGERY     right  . ROTATOR CUFF REPAIR     right  . THYROID SURGERY  2002  . TOTAL KNEE ARTHROPLASTY Left 06/02/2013   Dr Rhona Raider  . TOTAL KNEE ARTHROPLASTY Right 06/02/2013   Procedure: RIGHT TOTAL KNEE ARTHROPLASTY;  Surgeon: Hessie Dibble, MD;  Location: [facility];  Service: Orthopedics;  Laterality: Right;  . TOTAL KNEE ARTHROPLASTY Left 08/22/2014   Procedure: TOTAL KNEE ARTHROPLASTY;  Surgeon: Hessie Dibble, MD;  Location: [facility];  Service: Orthopedics;  Laterality: Left;   Current Outpatient Medications on File Prior to Visit  Medication Sig Dispense Refill  . amLODipine (NORVASC) 5 MG tablet Take 1 tablet (5 mg total) by mouth daily. (Patient taking differently: Take 10 mg by mouth daily. ) 90 tablet 3  . aspirin 81 MG tablet Take 81 mg by mouth daily.    . Calcium Carbonate-Vit D-Min (GNP CALCIUM 1200) 1200-1000 MG-UNIT CHEW 1 tab PO QAM 30 tablet   . Calcium Carbonate-Vitamin D (CALCIUM + D) 600-200 MG-UNIT TABS Take 1 tablet by mouth daily.     . Cranberry 250 MG CAPS Take 250 mg by mouth daily.    Marland Kitchen donepezil (ARICEPT) 5 MG tablet TAKE 1 TABLET (5 MG TOTAL) BY MOUTH AT BEDTIME. 30 tablet 5  . exemestane (AROMASIN) 25 MG tablet TAKE 1 TABLET BY MOUTH EVERY DAY 90 tablet 2  . folic acid (FOLVITE) 1 MG tablet TAKE 1 TABLET BY MOUTH EVERY DAY 90 tablet 3  . hydrochlorothiazide (HYDRODIURIL) 25 MG tablet Take 1 tablet (25 mg total) by mouth daily. 90 tablet 3  . KLOR-CON M20 20 MEQ tablet TAKE 1 TABLET BY MOUTH DAILY 90 tablet 3  . levothyroxine (SYNTHROID, LEVOTHROID) 125 MCG tablet TAKE 1 TABLET BY MOUTH ONCE A DAY 90 tablet 2  . LORazepam  (ATIVAN) 1 MG tablet Take 1 tablet (1 mg total) by mouth at bedtime. 30 tablet 1  . LORazepam (ATIVAN) 1 MG tablet TAKE 1 TABLET BY MOUTH TWICE A DAY AS NEEDED FOR ANXIETY 60  tablet 1  . losartan (COZAAR) 100 MG tablet TAKE 1 TABLET BY MOUTH EVERY DAY 90 tablet 0  . Multiple Vitamins-Minerals (CENTRUM SILVER ADULT 50+ PO) Take 1 tablet by mouth daily.     . nebivolol (BYSTOLIC) 5 MG tablet Take 1 tablet (5 mg total) by mouth daily. 30 tablet 5  . nitroGLYCERIN (NITROSTAT) 0.4 MG SL tablet Place 1 tablet (0.4 mg total) under the tongue every 5 (five) minutes as needed for chest pain. 25 tablet 3  . rosuvastatin (CRESTOR) 10 MG tablet TAKE 1 TABLET BY MOUTH EVERY DAY AT 6PM 90 tablet 3  . sertraline (ZOLOFT) 50 MG tablet Take 1 tablet (50 mg total) by mouth at bedtime. 90 tablet 5  . traMADol (ULTRAM) 50 MG tablet Take 1 tablet (50 mg total) by mouth every 8 (eight) hours as needed. 30 tablet 0   No current facility-administered medications on file prior to visit.    Allergies  Allergen Reactions  . Ambien [Zolpidem]     confusion  . Pyridium [Phenazopyridine Hcl] Other (See Comments)    hemolysis  . Sulfa Antibiotics Rash   Social History   Socioeconomic History  . Marital status: Widowed    Spouse name: Not on file  . Number of children: Not on file  . Years of education: Not on file  . Highest education level: Not on file  Occupational History  . Not on file  Social Needs  . Financial resource strain: Not on file  . Food insecurity:    Worry: Not on file    Inability: Not on file  . Transportation needs:    Medical: Not on file    Non-medical: Not on file  Tobacco Use  . Smoking status: Never Smoker  . Smokeless tobacco: Never Used  . Tobacco comment: never used tobacco  Substance and Sexual Activity  . Alcohol use: No    Alcohol/week: 0.0 oz  . Drug use: No  . Sexual activity: Not Currently  Lifestyle  . Physical activity:    Days per week: Not on file    Minutes  per session: Not on file  . Stress: Not on file  Relationships  . Social connections:    Talks on phone: Not on file    Gets together: Not on file    Attends religious service: Not on file    Active member of club or organization: Not on file    Attends meetings of clubs or organizations: Not on file    Relationship status: Not on file  . Intimate partner violence:    Fear of current or ex partner: Not on file    Emotionally abused: Not on file    Physically abused: Not on file    Forced sexual activity: Not on file  Other Topics Concern  . Not on file  Social History Narrative  . Not on file      Review of Systems  All other systems reviewed and are negative.      Objective:   Physical Exam  Constitutional: She is oriented to person, place, and time. She appears well-developed and well-nourished. No distress.  Cardiovascular: Normal rate and regular rhythm.  Murmur heard. Pulmonary/Chest: Effort normal and breath sounds normal. No stridor. No respiratory distress. She has no wheezes. She has no rales. She exhibits no tenderness.  Vitals reviewed.         Assessment & Plan:  Dysuria - Plan: Urinalysis, Routine w reflex microscopic  Rash of hands  Insomnia, unspecified type     I recommended that we increase her Zoloft from 50 mg a day to 100 mg a day to better address her anxiety and hopefully help treat her insomnia.  I hesitate to make any other changes given her underlying mild confusion.  I would treat the rash on her hands similar to dyshidrotic eczema with triamcinolone cream twice daily for 1 week.  I will be glad to check a urinalysis to rule out urinary tract infection however the patient was unable to leave a urine sample today during clinic and will bring one back when she can.

## 2018-02-24 ENCOUNTER — Encounter: Payer: Self-pay | Admitting: Podiatry

## 2018-02-24 ENCOUNTER — Ambulatory Visit (INDEPENDENT_AMBULATORY_CARE_PROVIDER_SITE_OTHER): Payer: Medicare Other | Admitting: Podiatry

## 2018-02-24 DIAGNOSIS — M779 Enthesopathy, unspecified: Secondary | ICD-10-CM

## 2018-02-24 DIAGNOSIS — M76821 Posterior tibial tendinitis, right leg: Secondary | ICD-10-CM

## 2018-02-24 MED ORDER — TRIAMCINOLONE ACETONIDE 10 MG/ML IJ SUSP
10.0000 mg | Freq: Once | INTRAMUSCULAR | Status: AC
  Administered 2018-02-24: 10 mg

## 2018-02-24 NOTE — Progress Notes (Signed)
Subjective:   Patient ID: Hannah Galvan, female   DOB: 84 y.o.   MRN: [id number]   HPI Patient presents with caregiver stating the AFO brace seems to be helping her and she did get new shoes which are beneficial.  Does have pain across the midfoot more on the central and lateral side of the joint in the area we did the injection seems improved   ROS      Objective:  Physical Exam  Patient's metatarsocuneiform first seems to be doing better with reduced inflammation and brace is fitted well with inflammation and pain of the dorsum of the right foot     Assessment:  Tendinitis secondary to chronic arthritis with inflammation with improvement with brace     Plan:  Discussed the importance of continued brace usage and I injected the dorsal tendon complex 3 mg Kenalog 5 mg Xylocaine which was tolerated well and patient will be seen back as needed

## 2018-03-01 ENCOUNTER — Observation Stay (HOSPITAL_COMMUNITY)
Admission: EM | Admit: 2018-03-01 | Discharge: 2018-03-03 | Disposition: A | Discharge status: Skilled Nursing Facility | Payer: Medicare Other | Attending: Internal Medicine | Admitting: Internal Medicine

## 2018-03-01 ENCOUNTER — Emergency Department (HOSPITAL_COMMUNITY): Payer: Medicare Other

## 2018-03-01 ENCOUNTER — Encounter (HOSPITAL_COMMUNITY): Payer: Self-pay

## 2018-03-01 ENCOUNTER — Other Ambulatory Visit: Payer: Self-pay

## 2018-03-01 DIAGNOSIS — Z79890 Hormone replacement therapy (postmenopausal): Secondary | ICD-10-CM | POA: Insufficient documentation

## 2018-03-01 DIAGNOSIS — E785 Hyperlipidemia, unspecified: Secondary | ICD-10-CM | POA: Diagnosis not present

## 2018-03-01 DIAGNOSIS — M064 Inflammatory polyarthropathy: Secondary | ICD-10-CM | POA: Diagnosis not present

## 2018-03-01 DIAGNOSIS — F0280 Dementia in other diseases classified elsewhere without behavioral disturbance: Secondary | ICD-10-CM | POA: Insufficient documentation

## 2018-03-01 DIAGNOSIS — S0093XA Contusion of unspecified part of head, initial encounter: Secondary | ICD-10-CM | POA: Diagnosis not present

## 2018-03-01 DIAGNOSIS — G309 Alzheimer's disease, unspecified: Secondary | ICD-10-CM | POA: Diagnosis not present

## 2018-03-01 DIAGNOSIS — M50221 Other cervical disc displacement at C4-C5 level: Secondary | ICD-10-CM | POA: Diagnosis not present

## 2018-03-01 DIAGNOSIS — E871 Hypo-osmolality and hyponatremia: Principal | ICD-10-CM | POA: Insufficient documentation

## 2018-03-01 DIAGNOSIS — E876 Hypokalemia: Secondary | ICD-10-CM | POA: Insufficient documentation

## 2018-03-01 DIAGNOSIS — M50223 Other cervical disc displacement at C6-C7 level: Secondary | ICD-10-CM | POA: Diagnosis not present

## 2018-03-01 DIAGNOSIS — Z888 Allergy status to other drugs, medicaments and biological substances status: Secondary | ICD-10-CM | POA: Insufficient documentation

## 2018-03-01 DIAGNOSIS — Z882 Allergy status to sulfonamides status: Secondary | ICD-10-CM | POA: Diagnosis not present

## 2018-03-01 DIAGNOSIS — S0003XA Contusion of scalp, initial encounter: Secondary | ICD-10-CM | POA: Insufficient documentation

## 2018-03-01 DIAGNOSIS — I110 Hypertensive heart disease with heart failure: Secondary | ICD-10-CM | POA: Diagnosis not present

## 2018-03-01 DIAGNOSIS — I10 Essential (primary) hypertension: Secondary | ICD-10-CM | POA: Diagnosis not present

## 2018-03-01 DIAGNOSIS — F329 Major depressive disorder, single episode, unspecified: Secondary | ICD-10-CM | POA: Diagnosis not present

## 2018-03-01 DIAGNOSIS — W19XXXA Unspecified fall, initial encounter: Secondary | ICD-10-CM | POA: Diagnosis not present

## 2018-03-01 DIAGNOSIS — K449 Diaphragmatic hernia without obstruction or gangrene: Secondary | ICD-10-CM | POA: Insufficient documentation

## 2018-03-01 DIAGNOSIS — G9340 Encephalopathy, unspecified: Secondary | ICD-10-CM | POA: Diagnosis not present

## 2018-03-01 DIAGNOSIS — F419 Anxiety disorder, unspecified: Secondary | ICD-10-CM | POA: Diagnosis not present

## 2018-03-01 DIAGNOSIS — M1990 Unspecified osteoarthritis, unspecified site: Secondary | ICD-10-CM | POA: Diagnosis not present

## 2018-03-01 DIAGNOSIS — E039 Hypothyroidism, unspecified: Secondary | ICD-10-CM | POA: Insufficient documentation

## 2018-03-01 DIAGNOSIS — M8580 Other specified disorders of bone density and structure, unspecified site: Secondary | ICD-10-CM | POA: Insufficient documentation

## 2018-03-01 DIAGNOSIS — I421 Obstructive hypertrophic cardiomyopathy: Secondary | ICD-10-CM | POA: Diagnosis not present

## 2018-03-01 DIAGNOSIS — Y92009 Unspecified place in unspecified non-institutional (private) residence as the place of occurrence of the external cause: Secondary | ICD-10-CM | POA: Diagnosis not present

## 2018-03-01 DIAGNOSIS — I6529 Occlusion and stenosis of unspecified carotid artery: Secondary | ICD-10-CM | POA: Diagnosis not present

## 2018-03-01 DIAGNOSIS — Z853 Personal history of malignant neoplasm of breast: Secondary | ICD-10-CM | POA: Insufficient documentation

## 2018-03-01 DIAGNOSIS — Z87440 Personal history of urinary (tract) infections: Secondary | ICD-10-CM | POA: Diagnosis not present

## 2018-03-01 DIAGNOSIS — W1830XA Fall on same level, unspecified, initial encounter: Secondary | ICD-10-CM | POA: Diagnosis not present

## 2018-03-01 DIAGNOSIS — Z96653 Presence of artificial knee joint, bilateral: Secondary | ICD-10-CM | POA: Diagnosis not present

## 2018-03-01 DIAGNOSIS — I5032 Chronic diastolic (congestive) heart failure: Secondary | ICD-10-CM | POA: Insufficient documentation

## 2018-03-01 DIAGNOSIS — Z7982 Long term (current) use of aspirin: Secondary | ICD-10-CM | POA: Insufficient documentation

## 2018-03-01 DIAGNOSIS — Z79899 Other long term (current) drug therapy: Secondary | ICD-10-CM | POA: Insufficient documentation

## 2018-03-01 LAB — CBC WITH DIFFERENTIAL/PLATELET
Basophils Absolute: 0 10*3/uL (ref 0.0–0.1)
Basophils Relative: 0 %
EOS PCT: 1 %
Eosinophils Absolute: 0.1 10*3/uL (ref 0.0–0.7)
HCT: 39.4 % (ref 36.0–46.0)
Hemoglobin: 14.1 g/dL (ref 12.0–15.0)
LYMPHS ABS: 2.8 10*3/uL (ref 0.7–4.0)
LYMPHS PCT: 22 %
MCH: 33.6 pg (ref 26.0–34.0)
MCHC: 35.8 g/dL (ref 30.0–36.0)
MCV: 93.8 fL (ref 78.0–100.0)
MONO ABS: 1.1 10*3/uL — AB (ref 0.1–1.0)
Monocytes Relative: 9 %
Neutro Abs: 8.7 10*3/uL — ABNORMAL HIGH (ref 1.7–7.7)
Neutrophils Relative %: 68 %
Platelets: 256 10*3/uL (ref 150–400)
RBC: 4.2 MIL/uL (ref 3.87–5.11)
RDW: 12.6 % (ref 11.5–15.5)
WBC: 12.6 10*3/uL — ABNORMAL HIGH (ref 4.0–10.5)

## 2018-03-01 LAB — BASIC METABOLIC PANEL
ANION GAP: 13 (ref 5–15)
BUN: 16 mg/dL (ref 6–20)
CHLORIDE: 91 mmol/L — AB (ref 101–111)
CO2: 23 mmol/L (ref 22–32)
CREATININE: 0.77 mg/dL (ref 0.44–1.00)
Calcium: 9.3 mg/dL (ref 8.9–10.3)
GFR calc non Af Amer: >60 mL/min (ref >60)
Glucose, Bld: 104 mg/dL — ABNORMAL HIGH (ref 65–99)
Potassium: 3.6 mmol/L (ref 3.5–5.1)
SODIUM: 127 mmol/L — AB (ref 135–145)

## 2018-03-01 LAB — URINALYSIS, ROUTINE W REFLEX MICROSCOPIC
Bilirubin Urine: NEGATIVE
Glucose, UA: NEGATIVE mg/dL
Hgb urine dipstick: NEGATIVE
KETONES UR: NEGATIVE mg/dL
LEUKOCYTES UA: NEGATIVE
NITRITE: NEGATIVE
Protein, ur: NEGATIVE mg/dL
SPECIFIC GRAVITY, URINE: 1.01 (ref 1.005–1.030)
pH: 8 (ref 5.0–8.0)

## 2018-03-01 LAB — BRAIN NATRIURETIC PEPTIDE: B NATRIURETIC PEPTIDE 5: 68.1 pg/mL (ref 0.0–100.0)

## 2018-03-01 MED ORDER — SODIUM CHLORIDE 0.9 % IV SOLN
INTRAVENOUS | Status: DC
  Administered 2018-03-01: 20:00:00 via INTRAVENOUS

## 2018-03-01 MED ORDER — SODIUM CHLORIDE 0.9 % IV SOLN
INTRAVENOUS | Status: DC
  Administered 2018-03-02: 01:00:00 via INTRAVENOUS

## 2018-03-01 MED ORDER — ROSUVASTATIN CALCIUM 10 MG PO TABS
10.0000 mg | ORAL_TABLET | Freq: Every day | ORAL | Status: DC
  Administered 2018-03-02: 10 mg via ORAL
  Filled 2018-03-01: qty 1

## 2018-03-01 MED ORDER — NEBIVOLOL HCL 5 MG PO TABS
5.0000 mg | ORAL_TABLET | Freq: Every day | ORAL | Status: DC
  Administered 2018-03-02 – 2018-03-03 (×2): 5 mg via ORAL
  Filled 2018-03-01 (×2): qty 1

## 2018-03-01 MED ORDER — EXEMESTANE 25 MG PO TABS
25.0000 mg | ORAL_TABLET | Freq: Every day | ORAL | Status: DC
  Administered 2018-03-02 – 2018-03-03 (×2): 25 mg via ORAL
  Filled 2018-03-01 (×2): qty 1

## 2018-03-01 MED ORDER — ENOXAPARIN SODIUM 40 MG/0.4ML ~~LOC~~ SOLN
40.0000 mg | Freq: Every day | SUBCUTANEOUS | Status: DC
  Administered 2018-03-02 (×2): 40 mg via SUBCUTANEOUS
  Filled 2018-03-01 (×2): qty 0.4

## 2018-03-01 MED ORDER — ONDANSETRON HCL 4 MG/2ML IJ SOLN: 4.0000 mg | Freq: Four times a day (QID) | INTRAMUSCULAR | Status: DC | PRN

## 2018-03-01 MED ORDER — ACETAMINOPHEN 650 MG RE SUPP: 650.0000 mg | Freq: Four times a day (QID) | RECTAL | Status: DC | PRN

## 2018-03-01 MED ORDER — ACETAMINOPHEN 325 MG PO TABS: 650.0000 mg | ORAL_TABLET | Freq: Four times a day (QID) | ORAL | Status: DC | PRN

## 2018-03-01 MED ORDER — AMLODIPINE BESYLATE 10 MG PO TABS
10.0000 mg | ORAL_TABLET | Freq: Every day | ORAL | Status: DC
  Administered 2018-03-02 – 2018-03-03 (×2): 10 mg via ORAL
  Filled 2018-03-01 (×2): qty 1

## 2018-03-01 MED ORDER — LORAZEPAM 1 MG PO TABS
1.0000 mg | ORAL_TABLET | Freq: Two times a day (BID) | ORAL | Status: DC | PRN
  Administered 2018-03-02: 1 mg via ORAL
  Filled 2018-03-01 (×2): qty 1

## 2018-03-01 MED ORDER — ASPIRIN EC 81 MG PO TBEC
81.0000 mg | DELAYED_RELEASE_TABLET | Freq: Every day | ORAL | Status: DC
  Administered 2018-03-02 – 2018-03-03 (×2): 81 mg via ORAL
  Filled 2018-03-01 (×2): qty 1

## 2018-03-01 MED ORDER — LOSARTAN POTASSIUM 50 MG PO TABS
100.0000 mg | ORAL_TABLET | Freq: Every day | ORAL | Status: DC
  Administered 2018-03-02 – 2018-03-03 (×2): 100 mg via ORAL
  Filled 2018-03-01 (×2): qty 2

## 2018-03-01 MED ORDER — LIDOCAINE HCL (PF) 1 % IJ SOLN
5.0000 mL | Freq: Once | INTRAMUSCULAR | Status: AC
  Filled 2018-03-01: qty 30

## 2018-03-01 MED ORDER — POTASSIUM CHLORIDE CRYS ER 20 MEQ PO TBCR
20.0000 meq | EXTENDED_RELEASE_TABLET | Freq: Every day | ORAL | Status: DC
  Administered 2018-03-02: 20 meq via ORAL
  Filled 2018-03-01: qty 1

## 2018-03-01 MED ORDER — ACETAMINOPHEN 325 MG PO TABS
650.0000 mg | ORAL_TABLET | Freq: Once | ORAL | Status: AC
  Administered 2018-03-01: 650 mg via ORAL
  Filled 2018-03-01: qty 2

## 2018-03-01 MED ORDER — LEVOTHYROXINE SODIUM 25 MCG PO TABS
125.0000 ug | ORAL_TABLET | Freq: Every day | ORAL | Status: DC
  Administered 2018-03-02 – 2018-03-03 (×2): 125 ug via ORAL
  Filled 2018-03-01 (×2): qty 1

## 2018-03-01 MED ORDER — LEVOTHYROXINE SODIUM 125 MCG PO TABS: 125.0000 ug | ORAL_TABLET | Freq: Every day | ORAL | Status: DC

## 2018-03-01 MED ORDER — HYDROCHLOROTHIAZIDE 25 MG PO TABS
25.0000 mg | ORAL_TABLET | Freq: Every day | ORAL | Status: DC
  Administered 2018-03-02 – 2018-03-03 (×2): 25 mg via ORAL
  Filled 2018-03-01 (×2): qty 1

## 2018-03-01 MED ORDER — DONEPEZIL HCL 5 MG PO TABS
5.0000 mg | ORAL_TABLET | Freq: Every day | ORAL | Status: DC
  Administered 2018-03-02 (×2): 5 mg via ORAL
  Filled 2018-03-01 (×2): qty 1

## 2018-03-01 MED ORDER — HYDROCODONE-ACETAMINOPHEN 5-325 MG PO TABS
1.0000 | ORAL_TABLET | Freq: Four times a day (QID) | ORAL | Status: DC | PRN
  Administered 2018-03-02: 1 via ORAL
  Filled 2018-03-01: qty 1

## 2018-03-01 MED ORDER — LORAZEPAM 2 MG/ML IJ SOLN
0.5000 mg | Freq: Once | INTRAMUSCULAR | Status: AC
  Administered 2018-03-01: 0.5 mg via INTRAVENOUS
  Filled 2018-03-01: qty 1

## 2018-03-01 MED ORDER — FOLIC ACID 1 MG PO TABS
1.0000 mg | ORAL_TABLET | Freq: Every day | ORAL | Status: DC
Start: 2018-03-02 — End: 2018-03-03
  Administered 2018-03-02 – 2018-03-03 (×2): 1 mg via ORAL
  Filled 2018-03-01 (×2): qty 1

## 2018-03-01 MED ORDER — SERTRALINE HCL 100 MG PO TABS
100.0000 mg | ORAL_TABLET | Freq: Every day | ORAL | Status: DC
  Administered 2018-03-02 – 2018-03-03 (×3): 100 mg via ORAL
  Filled 2018-03-01 (×3): qty 1

## 2018-03-01 MED ORDER — ONDANSETRON HCL 4 MG PO TABS: 4.0000 mg | ORAL_TABLET | Freq: Four times a day (QID) | ORAL | Status: DC | PRN

## 2018-03-01 NOTE — ED Notes (Signed)
Bed: QU54 Expected date:  Expected time:  Means of arrival:  Comments: EMS-head lac

## 2018-03-01 NOTE — Discharge Instructions (Signed)
Please follow with your primary care doctor in the next 2 days for a check-up. They must obtain records for further management.  ° °Do not hesitate to return to the Emergency Department for any new, worsening or concerning symptoms.  ° °

## 2018-03-01 NOTE — H&P (Signed)
History and Physical    Hannah Galvan QQP:[id number] DOB: 1931-09-05 DOA: 03/01/2018  Referring MD/NP/PA: Monico Blitz, PA-C PCP: Susy Frizzle, MD  Patient coming from: Home via EMS  Chief Complaint: Fall  I have personally briefly reviewed patient's old medical records in [place]   HPI: Hannah Galvan is a 84 y.o. female with medical history significant of breast cancer, HFpEF, HTN, HLD, hypothyroidism, and hyponatremia; who presents after having a fall at home.  At baseline patient walks with the use of a cane and lives alone.  She had gone outside to the mailbox and while turning around somehow lost balance and fell to the onto the concrete drive.  She notes hitting the left side of her head, shoulder, and hip which currently are causing her pain.  She notes that she has not been able to open the left eye much, but can see some.  Denies having recent medication changes, blood thinner use, loss of consciousness, palpitations, chest pain, dysuria, abdominal pain, diarrhea, nausea, or vomiting.  She reports otherwise being in her normal state of health, but notes that at home she has been having some difficulty in getting around.  ED Course: Upon admission into the emergency department patient was noted to be afebrile, pulse 65-72, respirations 20-23, blood pressure 130/72-141/65, and O2 saturations maintained on room air.  Labs revealed WBC 12.6, sodium 127, chloride 91.  CT imaging of the brain, cervical spine, maxillofacial showed small hematoma of the left left supraorbital soft tissue.  No fractures noted of the left shoulder or hip.  Review of Systems  Constitutional: Negative for chills and fever.  HENT: Negative for ear discharge and nosebleeds.   Eyes: Negative for double vision and photophobia.  Respiratory: Negative for cough and shortness of breath.   Cardiovascular: Negative for chest pain and palpitations.  Gastrointestinal: Negative for abdominal pain,  constipation, nausea and vomiting.  Genitourinary: Negative for dysuria and hematuria.  Musculoskeletal: Positive for falls, joint pain and myalgias.  Neurological: Negative for loss of consciousness.  Endo/Heme/Allergies: Does not bruise/bleed easily.  Psychiatric/Behavioral: Positive for memory loss. Negative for substance abuse.    Past Medical History:  Diagnosis Date  . Allergy   . Anxiety   . Arthritis   . Blood transfusion without reported diagnosis   . Cancer ([place])    breast, s/p RXT, bilateral  . Cataract   . History of echocardiogram    a. Echo 8/16: Vigorous LVF, EF 71-24%, grade 1 diastolic dysfunction, trivial AI, mild MR, mild RVE, normal RV function, PASP 33 mmHg // b. Echo 1/17 EF 65-70%, normal wall motion, grade 1 diastolic dysfunction, trivial AI, MAC, mild MR // c. Echo 11/26/16: mild LVH, EF 60-65, mild AI, mild MR, mild LAE, mild to mod TR  . History of stress test    Lexiscan Myoview 8/16:  EF 81%, breast attenuation, no ischemia; Low Risk // Myoview 11/26/16: EF 75, apical defect c/w soft tissue attenuation, no ischemia or scar; Low Risk  . HOCM (hypertrophic obstructive cardiomyopathy) ([place])   . Hyperlipidemia   . Hypertension   . Hypothyroidism   . Shortness of breath   . Thyroid disease     Past Surgical History:  Procedure Laterality Date  . ABDOMINAL HYSTERECTOMY  2008  . bladder tact    . BREAST LUMPECTOMY Right 2000  . BREAST LUMPECTOMY Left 2007  . CATARACT EXTRACTION  2011  . JOINT REPLACEMENT    . KNEE SURGERY  right  . ROTATOR CUFF REPAIR     right  . THYROID SURGERY  2002  . TOTAL KNEE ARTHROPLASTY Left 06/02/2013   Dr Rhona Raider  . TOTAL KNEE ARTHROPLASTY Right 06/02/2013   Procedure: RIGHT TOTAL KNEE ARTHROPLASTY;  Surgeon: Hessie Dibble, MD;  Location: [facility];  Service: Orthopedics;  Laterality: Right;  . TOTAL KNEE ARTHROPLASTY Left 08/22/2014   Procedure: TOTAL KNEE ARTHROPLASTY;  Surgeon: Hessie Dibble, MD;  Location: [facility];   Service: Orthopedics;  Laterality: Left;     reports that she has never smoked. She has never used smokeless tobacco. She reports that she does not drink alcohol or use drugs.  Allergies  Allergen Reactions  . Ambien [Zolpidem]     confusion  . Pyridium [Phenazopyridine Hcl] Other (See Comments)    hemolysis  . Sulfa Antibiotics Rash    Family History  Problem Relation Age of Onset  . Hypertension Mother   . Stroke Mother   . Cancer Father   . Cancer Sister        leukemia  . Cancer Brother        leukemia  . Cancer Sister        vaginal  . Cancer Sister        bladder  . Cancer Brother        prostate, skin  . Cancer Brother        colon  . Cancer Brother        prostate, bone mets  . Cancer Brother        skin  . Heart disease Sister     Prior to Admission medications   Medication Sig Start Date End Date Taking? Authorizing Provider  amLODipine (NORVASC) 5 MG tablet Take 1 tablet (5 mg total) by mouth daily. Patient taking differently: Take 10 mg by mouth daily.  06/23/17  Yes Susy Frizzle, MD  aspirin 81 MG tablet Take 81 mg by mouth daily.   Yes [provider]  Calcium Carbonate-Vit D-Min (GNP CALCIUM 1200) 1200-1000 MG-UNIT CHEW 1 tab PO QAM 08/05/17  Yes Susy Frizzle, MD  cholecalciferol (VITAMIN D) 1000 units tablet Take 1,000 Units by mouth daily.   Yes [provider]  Cranberry 250 MG CAPS Take 250 mg by mouth daily.   Yes [provider]  donepezil (ARICEPT) 5 MG tablet TAKE 1 TABLET (5 MG TOTAL) BY MOUTH AT BEDTIME. 06/12/17  Yes Susy Frizzle, MD  exemestane (AROMASIN) 25 MG tablet TAKE 1 TABLET BY MOUTH EVERY DAY 11/19/17  Yes Susy Frizzle, MD  folic acid (FOLVITE) 1 MG tablet TAKE 1 TABLET BY MOUTH EVERY DAY 02/04/18  Yes Susy Frizzle, MD  hydrochlorothiazide (HYDRODIURIL) 25 MG tablet Take 1 tablet (25 mg total) by mouth daily. 07/21/17  Yes Susy Frizzle, MD  KLOR-CON M20 20 MEQ tablet TAKE 1 TABLET  BY MOUTH DAILY 11/10/16  Yes Susy Frizzle, MD  levothyroxine (SYNTHROID, LEVOTHROID) 125 MCG tablet TAKE 1 TABLET BY MOUTH ONCE A DAY 12/25/17  Yes Susy Frizzle, MD  LORazepam (ATIVAN) 1 MG tablet TAKE 1 TABLET BY MOUTH TWICE A DAY AS NEEDED FOR ANXIETY 02/01/18  Yes Susy Frizzle, MD  losartan (COZAAR) 100 MG tablet TAKE 1 TABLET BY MOUTH EVERY DAY 02/04/18  Yes Susy Frizzle, MD  Multiple Vitamins-Minerals (CENTRUM SILVER ADULT 50+ PO) Take 1 tablet by mouth daily.    Yes [provider]  nebivolol (BYSTOLIC) 5 MG  tablet Take 1 tablet (5 mg total) by mouth daily. 08/31/17  Yes Susy Frizzle, MD  rosuvastatin (CRESTOR) 10 MG tablet TAKE 1 TABLET BY MOUTH EVERY DAY AT 6PM 07/16/17  Yes Susy Frizzle, MD  sertraline (ZOLOFT) 100 MG tablet Take 1 tablet (100 mg total) by mouth daily. 02/16/18  Yes Susy Frizzle, MD  LORazepam (ATIVAN) 1 MG tablet Take 1 tablet (1 mg total) by mouth at bedtime. Patient not taking: Reported on 03/01/2018 12/04/17   Susy Frizzle, MD  nitroGLYCERIN (NITROSTAT) 0.4 MG SL tablet Place 1 tablet (0.4 mg total) under the tongue every 5 (five) minutes as needed for chest pain. 11/29/15   Richardson Dopp T, PA-C  sertraline (ZOLOFT) 50 MG tablet Take 1 tablet (50 mg total) by mouth at bedtime. Patient not taking: Reported on 03/01/2018 08/31/17   Susy Frizzle, MD  traMADol (ULTRAM) 50 MG tablet Take 1 tablet (50 mg total) by mouth every 8 (eight) hours as needed. Patient not taking: Reported on 03/01/2018 08/18/17   Susy Frizzle, MD  triamcinolone cream (KENALOG) 0.1 % Apply 1 application topically 2 (two) times daily. 02/16/18   Susy Frizzle, MD    Physical Exam:  Constitutional: Elderly female who appears to be Vitals:   03/01/18 1626 03/01/18 1635 03/01/18 1923 03/01/18 2000  BP:  (!) 141/65 130/72 134/81  Pulse:  66 65 68  Resp:  20 (!) 23 20  Temp:  98.2 F (36.8 C)    TempSrc:  Oral    SpO2:  96% 96% 98%  Weight: 70.8 kg  (156 lb)     Height: 5' 2.5" (1.588 m)      Eyes:Marland Kitchen  Periorbital bruising noted at the left eye.  No significant conjunctival injection noted. ENMT: Mucous membranes are moist. Posterior pharynx clear of any exudate or lesions.Normal dentition.  Neck: normal, supple, no masses, no thyromegaly Respiratory: clear to auscultation bilaterally, no wheezing, no crackles. Normal respiratory effort. No accessory muscle use.  Cardiovascular: Regular rate and rhythm, no murmurs / rubs / gallops. No extremity edema. 2+ pedal pulses. No carotid bruits.  Abdomen: no tenderness, no masses palpated. No hepatosplenomegaly. Bowel sounds positive.  Musculoskeletal: no clubbing / cyanosis. No joint deformity upper and lower extremities. Good ROM, no contractures. Normal muscle tone.  Skin: Abrasions and bruising to the left forehead, shoulder, and hip. Neurologic: CN 2-12 grossly intact. Sensation intact, DTR normal. Strength 5/5 in all 4.  Psychiatric: Normal judgment and insight. Alert and oriented x 3. Normal mood.     Labs on Admission: I have personally reviewed following labs and imaging studies  CBC: Recent Labs  Lab 03/01/18 1822  WBC 12.6*  NEUTROABS 8.7*  HGB 14.1  HCT 39.4  MCV 93.8  PLT 267   Basic Metabolic Panel: Recent Labs  Lab 03/01/18 1822  NA 127*  K 3.6  CL 91*  CO2 23  GLUCOSE 104*  BUN 16  CREATININE 0.77  CALCIUM 9.3   GFR: Estimated Creatinine Clearance: 48.8 mL/min (by C-G formula based on SCr of 0.77 mg/dL). Liver Function Tests: No results for input(s): AST, ALT, ALKPHOS, BILITOT, PROT, ALBUMIN in the last 168 hours. No results for input(s): LIPASE, AMYLASE in the last 168 hours. No results for input(s): AMMONIA in the last 168 hours. Coagulation Profile: No results for input(s): INR, PROTIME in the last 168 hours. Cardiac Enzymes: No results for input(s): CKTOTAL, CKMB, CKMBINDEX, TROPONINI in the last 168 hours. BNP (last 3  results) No results for  input(s): PROBNP in the last 8760 hours. HbA1C: No results for input(s): HGBA1C in the last 72 hours. CBG: No results for input(s): GLUCAP in the last 168 hours. Lipid Profile: No results for input(s): CHOL, HDL, LDLCALC, TRIG, CHOLHDL, LDLDIRECT in the last 72 hours. Thyroid Function Tests: No results for input(s): TSH, T4TOTAL, FREET4, T3FREE, THYROIDAB in the last 72 hours. Anemia Panel: No results for input(s): VITAMINB12, FOLATE, FERRITIN, TIBC, IRON, RETICCTPCT in the last 72 hours. Urine analysis:    Component Value Date/Time   COLORURINE YELLOW 03/01/2018 2005   APPEARANCEUR CLEAR 03/01/2018 2005   LABSPEC 1.010 03/01/2018 2005   PHURINE 8.0 03/01/2018 2005   GLUCOSEU NEGATIVE 03/01/2018 2005   HGBUR NEGATIVE 03/01/2018 2005   [place] NEGATIVE 03/01/2018 2005   BILIRUBINUR neg 08/31/2012 [place] 03/01/2018 2005   PROTEINUR NEGATIVE 03/01/2018 2005   UROBILINOGEN 0.2 10/02/2015 2102   NITRITE NEGATIVE 03/01/2018 2005   LEUKOCYTESUR NEGATIVE 03/01/2018 2005   Sepsis Labs: No results found for this or any previous visit (from the past 240 hour(s)).   Radiological Exams on Admission: Dg Chest 2 View  Result Date: 03/01/2018 CLINICAL DATA:  Witnessed fall.  Preop. EXAM: CHEST - 2 VIEW COMPARISON:  07/16/2017 FINDINGS: Moderate-sized hiatal hernia accounting for a rounded density over the cardiac silhouette. The cardiopericardial silhouette is top-normal in size. There is minimal aortic atherosclerosis at the arch without aneurysmal dilatation. There is mild central vascular congestion without pneumothorax or pulmonary consolidation. Minimal scarring in the right upper lobe is identified. Surgical clips project over the right axilla. No acute osseous abnormality. IMPRESSION: Mild vascular congestion without acute pulmonary consolidation. Aortic atherosclerosis without aneurysm. Moderate-sized hiatal hernia. Electronically Signed   By: Ashley Royalty M.D.   On:  03/01/2018 19:18   Ct Head Wo Contrast  Result Date: 03/01/2018 CLINICAL DATA:  Witnessed fall by a neighbor after tripping over a cane. No loss of consciousness. Contusion of the left forehead with 1/2 inch laceration. EXAM: CT HEAD WITHOUT CONTRAST CT MAXILLOFACIAL WITHOUT CONTRAST CT CERVICAL SPINE WITHOUT CONTRAST TECHNIQUE: Multidetector CT imaging of the head, cervical spine, and maxillofacial structures were performed using the standard protocol without intravenous contrast. Multiplanar CT image reconstructions of the cervical spine and maxillofacial structures were also generated. COMPARISON:  08/13/2017 CT head, 10/02/2015 CT cervical spine FINDINGS: CT HEAD FINDINGS BRAIN: There is sulcal and ventricular prominence consistent with superficial and central atrophy. No intraparenchymal hemorrhage, mass effect nor midline shift. Periventricular and subcortical white matter hypodensities consistent with chronic small vessel ischemic disease are identified. Idiopathic bilateral basal ganglial calcifications. No acute large vascular territory infarcts. No abnormal extra-axial fluid collections. Basal cisterns are not effaced and midline. VASCULAR: No hyperdense vessel sign. Mild calcific atherosclerosis of the carotid siphons. SKULL: No skull fracture. No significant scalp soft tissue swelling. OTHER: Left frontal/supraorbital scalp hematoma. CT MAXILLOFACIAL FINDINGS Osseous: No fracture or mandibular dislocation. No destructive process. Orbits: Bilateral lens replacements. Intact orbits and globes. No retrobulbar hemorrhage nor acute abnormality. Sinuses: No acute sinus disease nor fluid levels. Minimal ethmoid sinus mucosal thickening. Clear bilateral mastoids. Soft tissues: Left supraorbital soft tissue hematoma. CT CERVICAL SPINE FINDINGS Alignment: Mild dextroconvex curvature of the cervical spine likely positional. Maintained cervical lordosis. Skull base and vertebrae: Erosive change about the odontoid  which can be seen in inflammatory arthritis. Intact skull base and craniocervical relationship. Joint space narrowing spurring about the atlantodental interval. No acute cervical spine fracture or suspicious osseous lesions.  Soft tissues and spinal canal: No prevertebral soft tissue swelling. No visible canal hematoma. Disc levels: Mild disc space narrowing C3 through C4, moderate disc space narrowing C4-5 and marked at C5-6 and C6-7. Central disc bulges noted at C2-3 and C3-4 with disc-osteophyte complexes from C4-5 through C6-7. Small posterior marginal osteophytes are noted from C4 through C7 with associated uncovertebral joint osteoarthritis and uncinate spurring. There is mild to moderate left-sided foraminal encroachment on the left at C3-4 and C4-5 from facet and uncovertebral joint osteoarthritis. Upper chest: Minimal atherosclerosis of the included aortic arch. Minimal debris within the upper thoracic esophagus. Clear lung apices with minimal atelectasis and/or scarring. Other: None IMPRESSION: 1. Atrophy with chronic small vessel ischemia. 2. Left supraorbital soft tissue swelling with hematoma. No underlying facial or skull fracture. 3. Cervical spondylosis without acute posttraumatic fracture or listhesis. Electronically Signed   By: Ashley Royalty M.D.   On: 03/01/2018 17:59   Ct Cervical Spine Wo Contrast  Result Date: 03/01/2018 CLINICAL DATA:  Witnessed fall by a neighbor after tripping over a cane. No loss of consciousness. Contusion of the left forehead with 1/2 inch laceration. EXAM: CT HEAD WITHOUT CONTRAST CT MAXILLOFACIAL WITHOUT CONTRAST CT CERVICAL SPINE WITHOUT CONTRAST TECHNIQUE: Multidetector CT imaging of the head, cervical spine, and maxillofacial structures were performed using the standard protocol without intravenous contrast. Multiplanar CT image reconstructions of the cervical spine and maxillofacial structures were also generated. COMPARISON:  08/13/2017 CT head, 10/02/2015 CT  cervical spine FINDINGS: CT HEAD FINDINGS BRAIN: There is sulcal and ventricular prominence consistent with superficial and central atrophy. No intraparenchymal hemorrhage, mass effect nor midline shift. Periventricular and subcortical white matter hypodensities consistent with chronic small vessel ischemic disease are identified. Idiopathic bilateral basal ganglial calcifications. No acute large vascular territory infarcts. No abnormal extra-axial fluid collections. Basal cisterns are not effaced and midline. VASCULAR: No hyperdense vessel sign. Mild calcific atherosclerosis of the carotid siphons. SKULL: No skull fracture. No significant scalp soft tissue swelling. OTHER: Left frontal/supraorbital scalp hematoma. CT MAXILLOFACIAL FINDINGS Osseous: No fracture or mandibular dislocation. No destructive process. Orbits: Bilateral lens replacements. Intact orbits and globes. No retrobulbar hemorrhage nor acute abnormality. Sinuses: No acute sinus disease nor fluid levels. Minimal ethmoid sinus mucosal thickening. Clear bilateral mastoids. Soft tissues: Left supraorbital soft tissue hematoma. CT CERVICAL SPINE FINDINGS Alignment: Mild dextroconvex curvature of the cervical spine likely positional. Maintained cervical lordosis. Skull base and vertebrae: Erosive change about the odontoid which can be seen in inflammatory arthritis. Intact skull base and craniocervical relationship. Joint space narrowing spurring about the atlantodental interval. No acute cervical spine fracture or suspicious osseous lesions. Soft tissues and spinal canal: No prevertebral soft tissue swelling. No visible canal hematoma. Disc levels: Mild disc space narrowing C3 through C4, moderate disc space narrowing C4-5 and marked at C5-6 and C6-7. Central disc bulges noted at C2-3 and C3-4 with disc-osteophyte complexes from C4-5 through C6-7. Small posterior marginal osteophytes are noted from C4 through C7 with associated uncovertebral joint  osteoarthritis and uncinate spurring. There is mild to moderate left-sided foraminal encroachment on the left at C3-4 and C4-5 from facet and uncovertebral joint osteoarthritis. Upper chest: Minimal atherosclerosis of the included aortic arch. Minimal debris within the upper thoracic esophagus. Clear lung apices with minimal atelectasis and/or scarring. Other: None IMPRESSION: 1. Atrophy with chronic small vessel ischemia. 2. Left supraorbital soft tissue swelling with hematoma. No underlying facial or skull fracture. 3. Cervical spondylosis without acute posttraumatic fracture or listhesis. Electronically Signed  By: Ashley Royalty M.D.   On: 03/01/2018 17:59   Dg Shoulder Left  Result Date: 03/01/2018 CLINICAL DATA:  Left shoulder pain after fall EXAM: LEFT SHOULDER - 2+ VIEW COMPARISON:  None. FINDINGS: There is no evidence of fracture or dislocation. AC and glenohumeral joints are maintained. No abnormal soft tissue mass or mineralization. The adjacent ribs and lung are nonacute. There is aortic atherosclerosis at the arch without aneurysm. IMPRESSION: No acute osseous abnormality. Electronically Signed   By: Ashley Royalty M.D.   On: 03/01/2018 20:39   Dg Hip Unilat W Or Wo Pelvis 2-3 Views Left  Result Date: 03/01/2018 CLINICAL DATA:  Tripped over cane, witnessed fall. EXAM: DG HIP (WITH OR WITHOUT PELVIS) 2-3V LEFT COMPARISON:  None. FINDINGS: There is no evidence of hip fracture or dislocation. There is no evidence of arthropathy or other focal bone abnormality. Mild vascular calcifications. Lumbar scoliosis and degenerative change. IMPRESSION: Negative. Electronically Signed   By: Elon Alas M.D.   On: 03/01/2018 19:14   Ct Maxillofacial Wo Contrast  Result Date: 03/01/2018 CLINICAL DATA:  Witnessed fall by a neighbor after tripping over a cane. No loss of consciousness. Contusion of the left forehead with 1/2 inch laceration. EXAM: CT HEAD WITHOUT CONTRAST CT MAXILLOFACIAL WITHOUT CONTRAST CT  CERVICAL SPINE WITHOUT CONTRAST TECHNIQUE: Multidetector CT imaging of the head, cervical spine, and maxillofacial structures were performed using the standard protocol without intravenous contrast. Multiplanar CT image reconstructions of the cervical spine and maxillofacial structures were also generated. COMPARISON:  08/13/2017 CT head, 10/02/2015 CT cervical spine FINDINGS: CT HEAD FINDINGS BRAIN: There is sulcal and ventricular prominence consistent with superficial and central atrophy. No intraparenchymal hemorrhage, mass effect nor midline shift. Periventricular and subcortical white matter hypodensities consistent with chronic small vessel ischemic disease are identified. Idiopathic bilateral basal ganglial calcifications. No acute large vascular territory infarcts. No abnormal extra-axial fluid collections. Basal cisterns are not effaced and midline. VASCULAR: No hyperdense vessel sign. Mild calcific atherosclerosis of the carotid siphons. SKULL: No skull fracture. No significant scalp soft tissue swelling. OTHER: Left frontal/supraorbital scalp hematoma. CT MAXILLOFACIAL FINDINGS Osseous: No fracture or mandibular dislocation. No destructive process. Orbits: Bilateral lens replacements. Intact orbits and globes. No retrobulbar hemorrhage nor acute abnormality. Sinuses: No acute sinus disease nor fluid levels. Minimal ethmoid sinus mucosal thickening. Clear bilateral mastoids. Soft tissues: Left supraorbital soft tissue hematoma. CT CERVICAL SPINE FINDINGS Alignment: Mild dextroconvex curvature of the cervical spine likely positional. Maintained cervical lordosis. Skull base and vertebrae: Erosive change about the odontoid which can be seen in inflammatory arthritis. Intact skull base and craniocervical relationship. Joint space narrowing spurring about the atlantodental interval. No acute cervical spine fracture or suspicious osseous lesions. Soft tissues and spinal canal: No prevertebral soft tissue  swelling. No visible canal hematoma. Disc levels: Mild disc space narrowing C3 through C4, moderate disc space narrowing C4-5 and marked at C5-6 and C6-7. Central disc bulges noted at C2-3 and C3-4 with disc-osteophyte complexes from C4-5 through C6-7. Small posterior marginal osteophytes are noted from C4 through C7 with associated uncovertebral joint osteoarthritis and uncinate spurring. There is mild to moderate left-sided foraminal encroachment on the left at C3-4 and C4-5 from facet and uncovertebral joint osteoarthritis. Upper chest: Minimal atherosclerosis of the included aortic arch. Minimal debris within the upper thoracic esophagus. Clear lung apices with minimal atelectasis and/or scarring. Other: None IMPRESSION: 1. Atrophy with chronic small vessel ischemia. 2. Left supraorbital soft tissue swelling with hematoma. No underlying facial or skull  fracture. 3. Cervical spondylosis without acute posttraumatic fracture or listhesis. Electronically Signed   By: Ashley Royalty M.D.   On: 03/01/2018 17:59    EKG: Independently reviewed.  Sinus rhythm with left bundle branch block at 72 bpm  Assessment/Plan Hyponatremia: Acute on chronic. On admission patient's sodium level was noted to be 127. - Admit to telemetry bed - BMPs every 6 hours x2 - Changed IV fluids 250 mL/h given CHF history - Adjust IV fluids as needed  Fall with head contusion: Acute.  Patient had a mechanical fall onto concrete denies any prodromal symptoms. - Neurochecks every 4 hours x3 - PT to eval and treat - Social work/care management consult for possible need of assistance at home  Diastolic CHF: Last echocardiogram showed EF of 55-60% with grade 1 diastolic dysfunction on 07/4706.  On admission patient did not appear to be grossly fluid overloaded. - Strict I&Os and daily weights  Essential hypertension - Continue losartan, Bystolic, and amlodipine  Hypothyroidism - Check TSH - Continue levothyroxine     Anxiety/depression - Continue Zoloft, Ativan as needed  Dementia: Mild. - continue Aricept   History of breast cancer - Continue exemestanse  DVT prophylaxis: lovenox   Code Status: full Family Communication: No family present at bedside Disposition Plan: To be determined Consults called: None Admission status: Observation  Norval Morton MD [facility] Pager [phone]   If 7PM-7AM, please contact night-coverage [email or web address] Password [email or web address]  03/01/2018, 9:30 PM

## 2018-03-01 NOTE — ED Notes (Signed)
Dr. Smith at bedside.

## 2018-03-01 NOTE — ED Triage Notes (Signed)
Per [facility], Witnessed fall by neighbor. Pt reports she tripped over cane. NO LOC. Contusion left forehead with 1/2 inch laceration. Bleeding controlled. Denies neck and back pain. c-collared upon arrival for precaution. C/o BIL leg pain. Neuro intact.

## 2018-03-01 NOTE — ED Provider Notes (Signed)
[facility] DEPT Provider Note   CSN: [id number] Arrival date & time: 03/01/18  1611     History   Chief Complaint Chief Complaint  Patient presents with  . Fall  . Head Laceration    HPI   Blood pressure (!) 141/65, pulse 66, temperature 98.2 F (36.8 C), temperature source Oral, resp. rate 20, height 5' 2.5" (1.588 m), weight 70.8 kg (156 lb), SpO2 96 %.  Hannah Galvan is a 84 y.o. female  with PMH significant for Alzheimer's dementia states that she was walking to get her mail she is not quite sure but somehow she was down on the concrete screaming for help.  She does not think that she lost consciousness, she not anticoagulated she denies any significant headache, nausea vomiting, change in vision.  She endorses neck pain with no numbness or weakness she is also endorsing some cramping in the bilateral thighs.  Last tetanus shot is unknown.  She does not think that she put her hands out to brace her fall.  She denies any chest pain, abdominal pain, major joint pain or difficulty moving major joints.  Chart review shows last Tdap was in 2012.  Son reports that she has been falling multiple times over the course of the last several years.  She refuses to use her walker which she normally ambulates with a cane.   Past Medical History:  Diagnosis Date  . Allergy   . Anxiety   . Arthritis   . Blood transfusion without reported diagnosis   . Cancer ([place])    breast, s/p RXT, bilateral  . Cataract   . History of echocardiogram    a. Echo 8/16: Vigorous LVF, EF 41-42%, grade 1 diastolic dysfunction, trivial AI, mild MR, mild RVE, normal RV function, PASP 33 mmHg // b. Echo 1/17 EF 65-70%, normal wall motion, grade 1 diastolic dysfunction, trivial AI, MAC, mild MR // c. Echo 11/26/16: mild LVH, EF 60-65, mild AI, mild MR, mild LAE, mild to mod TR  . History of stress test    Lexiscan Myoview 8/16:  EF 81%, breast attenuation, no ischemia; Low Risk //  Myoview 11/26/16: EF 75, apical defect c/w soft tissue attenuation, no ischemia or scar; Low Risk  . HOCM (hypertrophic obstructive cardiomyopathy) ([place])   . Hyperlipidemia   . Hypertension   . Hypothyroidism   . Shortness of breath   . Thyroid disease     Patient Active Problem List   Diagnosis Date Noted  . HOCM (hypertrophic obstructive cardiomyopathy) ([place])   . Alzheimer's dementia without behavioral disturbance 01/27/2017  . Chest pain 11/17/2015  . Dyspnea 12/06/2014  . Anxiety and depression 09/03/2014  . CN (constipation) 09/03/2014  . Essential hypertension, benign 08/28/2014  . Status post left knee replacement 08/28/2014  . Acute delirium 06/05/2013  . Hyponatremia 06/05/2013  . Acute urinary retention 06/05/2013  . Hypokalemia 06/05/2013  . Fever, unspecified 06/05/2013  . Hypothyroidism 06/05/2013  . Left knee DJD 06/02/2013    Class: Chronic  . UTI (lower urinary tract infection) 08/31/2012  . Heart murmur, aortic 07/15/2012  . Dizziness - giddy 07/15/2012  . Gait disturbance 12/21/2011  . Osteopenia 12/21/2011  . Cholelithiasis with cholecystitis without obstruction 12/21/2011  . Liver hemangioma 12/21/2011  . Pulmonary nodule, right 12/21/2011  . Breast cancer ([place]) 12/03/2011  . Edema of both legs 09/15/2011  . Thyroid disease   . Hyperlipidemia   . Arthritis   . Cancer ([place])  Past Surgical History:  Procedure Laterality Date  . ABDOMINAL HYSTERECTOMY  2008  . bladder tact    . BREAST LUMPECTOMY Right 2000  . BREAST LUMPECTOMY Left 2007  . CATARACT EXTRACTION  2011  . JOINT REPLACEMENT    . KNEE SURGERY     right  . ROTATOR CUFF REPAIR     right  . THYROID SURGERY  2002  . TOTAL KNEE ARTHROPLASTY Left 06/02/2013   Dr Rhona Raider  . TOTAL KNEE ARTHROPLASTY Right 06/02/2013   Procedure: RIGHT TOTAL KNEE ARTHROPLASTY;  Surgeon: Hessie Dibble, MD;  Location: [facility];  Service: Orthopedics;  Laterality: Right;  . TOTAL KNEE ARTHROPLASTY Left  08/22/2014   Procedure: TOTAL KNEE ARTHROPLASTY;  Surgeon: Hessie Dibble, MD;  Location: [facility];  Service: Orthopedics;  Laterality: Left;     OB History    Gravida  2   Para  2   Term      Preterm      AB      Living        SAB      TAB      Ectopic      Multiple      Live Births               Home Medications    Prior to Admission medications   Medication Sig Start Date End Date Taking? Authorizing Provider  amLODipine (NORVASC) 5 MG tablet Take 1 tablet (5 mg total) by mouth daily. Patient taking differently: Take 10 mg by mouth daily.  06/23/17  Yes Susy Frizzle, MD  aspirin 81 MG tablet Take 81 mg by mouth daily.   Yes [provider]  Calcium Carbonate-Vit D-Min (GNP CALCIUM 1200) 1200-1000 MG-UNIT CHEW 1 tab PO QAM 08/05/17  Yes Susy Frizzle, MD  cholecalciferol (VITAMIN D) 1000 units tablet Take 1,000 Units by mouth daily.   Yes [provider]  Cranberry 250 MG CAPS Take 250 mg by mouth daily.   Yes [provider]  donepezil (ARICEPT) 5 MG tablet TAKE 1 TABLET (5 MG TOTAL) BY MOUTH AT BEDTIME. 06/12/17  Yes Susy Frizzle, MD  exemestane (AROMASIN) 25 MG tablet TAKE 1 TABLET BY MOUTH EVERY DAY 11/19/17  Yes Susy Frizzle, MD  folic acid (FOLVITE) 1 MG tablet TAKE 1 TABLET BY MOUTH EVERY DAY 02/04/18  Yes Susy Frizzle, MD  hydrochlorothiazide (HYDRODIURIL) 25 MG tablet Take 1 tablet (25 mg total) by mouth daily. 07/21/17  Yes Susy Frizzle, MD  KLOR-CON M20 20 MEQ tablet TAKE 1 TABLET BY MOUTH DAILY 11/10/16  Yes Susy Frizzle, MD  levothyroxine (SYNTHROID, LEVOTHROID) 125 MCG tablet TAKE 1 TABLET BY MOUTH ONCE A DAY 12/25/17  Yes Susy Frizzle, MD  LORazepam (ATIVAN) 1 MG tablet TAKE 1 TABLET BY MOUTH TWICE A DAY AS NEEDED FOR ANXIETY 02/01/18  Yes Susy Frizzle, MD  losartan (COZAAR) 100 MG tablet TAKE 1 TABLET BY MOUTH EVERY DAY 02/04/18  Yes Susy Frizzle, MD  Multiple Vitamins-Minerals  (CENTRUM SILVER ADULT 50+ PO) Take 1 tablet by mouth daily.    Yes [provider]  nebivolol (BYSTOLIC) 5 MG tablet Take 1 tablet (5 mg total) by mouth daily. 08/31/17  Yes Susy Frizzle, MD  rosuvastatin (CRESTOR) 10 MG tablet TAKE 1 TABLET BY MOUTH EVERY DAY AT 6PM 07/16/17  Yes Susy Frizzle, MD  sertraline (ZOLOFT) 100 MG tablet Take 1 tablet (100 mg total)  by mouth daily. 02/16/18  Yes Susy Frizzle, MD  LORazepam (ATIVAN) 1 MG tablet Take 1 tablet (1 mg total) by mouth at bedtime. Patient not taking: Reported on 03/01/2018 12/04/17   Susy Frizzle, MD  nitroGLYCERIN (NITROSTAT) 0.4 MG SL tablet Place 1 tablet (0.4 mg total) under the tongue every 5 (five) minutes as needed for chest pain. 11/29/15   Richardson Dopp T, PA-C  sertraline (ZOLOFT) 50 MG tablet Take 1 tablet (50 mg total) by mouth at bedtime. Patient not taking: Reported on 03/01/2018 08/31/17   Susy Frizzle, MD  traMADol (ULTRAM) 50 MG tablet Take 1 tablet (50 mg total) by mouth every 8 (eight) hours as needed. Patient not taking: Reported on 03/01/2018 08/18/17   Susy Frizzle, MD  triamcinolone cream (KENALOG) 0.1 % Apply 1 application topically 2 (two) times daily. 02/16/18   Susy Frizzle, MD    Family History Family History  Problem Relation Age of Onset  . Hypertension Mother   . Stroke Mother   . Cancer Father   . Cancer Sister        leukemia  . Cancer Brother        leukemia  . Cancer Sister        vaginal  . Cancer Sister        bladder  . Cancer Brother        prostate, skin  . Cancer Brother        colon  . Cancer Brother        prostate, bone mets  . Cancer Brother        skin  . Heart disease Sister     Social History Social History   Tobacco Use  . Smoking status: Never Smoker  . Smokeless tobacco: Never Used  . Tobacco comment: never used tobacco  Substance Use Topics  . Alcohol use: No    Alcohol/week: 0.0 oz  . Drug use: No     Allergies   Ambien  [zolpidem]; Pyridium [phenazopyridine hcl]; and Sulfa antibiotics   Review of Systems Review of Systems  A complete review of systems was obtained and all systems are negative except as noted in the HPI and PMH.   Physical Exam Updated Vital Signs BP 134/81   Pulse 68   Temp 98.2 F (36.8 C) (Oral)   Resp 20   Ht 5' 2.5" (1.588 m)   Wt 70.8 kg (156 lb)   SpO2 98%   BMI 28.08 kg/m   Physical Exam  Constitutional: She is oriented to person, place, and time. She appears well-developed and well-nourished. No distress.  HENT:  Head: Normocephalic.  Mouth/Throat: Oropharynx is clear and moist.  Left-sided periorbital ecchymoses, extraocular movement is intact without pain or diplopia, pupils equal round and reactive to light.  No intraoral trauma, no epistaxis.  She has some dried blood on her face, mild abrasions with no large lacerations to the forehead.  Eyes: Pupils are equal, round, and reactive to light. Conjunctivae and EOM are normal.  Neck: Normal range of motion.  Rigid c-collar in place, she has positive midline C-spine tenderness to palpation, grip strength is 5 out of 5 bilaterally.  Sensation intact to pinprick and light touch bilaterally.  Cardiovascular: Normal rate, regular rhythm and intact distal pulses.  Pulmonary/Chest: Effort normal and breath sounds normal.  Abdominal: Soft. There is no tenderness.  Musculoskeletal: Normal range of motion.  Abrasion to left shoulder with reduced range of motion, no focal  bony tenderness along the olecranon or snuffbox bilaterally.  Patient with large ecchymoses to left thigh, compartment is soft, distally neurovascularly intact, no tenderness to palpation along the hip  Neurological: She is alert and oriented to person, place, and time.  Skin: She is not diaphoretic.  Psychiatric: She has a normal mood and affect.  Nursing note and vitals reviewed.    ED Treatments / Results  Labs (all labs ordered are listed, but only  abnormal results are displayed) Labs Reviewed  CBC WITH DIFFERENTIAL/PLATELET - Abnormal; Notable for the following components:      Result Value   WBC 12.6 (*)    Neutro Abs 8.7 (*)    Monocytes Absolute 1.1 (*)    All other components within normal limits  BASIC METABOLIC PANEL - Abnormal; Notable for the following components:   Sodium 127 (*)    Chloride 91 (*)    Glucose, Bld 104 (*)    All other components within normal limits  URINALYSIS, ROUTINE W REFLEX MICROSCOPIC  BRAIN NATRIURETIC PEPTIDE    EKG EKG Interpretation  Date/Time:  Monday March 01 2018 17:09:25 EDT Ventricular Rate:  66 PR Interval:    QRS Duration: 143 QT Interval:  485 QTC Calculation: 509 R Axis:   -35 Text Interpretation:  Sinus rhythm Multiple ventricular premature complexes Left bundle branch block Confirmed by Davonna Belling [phone]) on 03/01/2018 6:01:06 PM   Radiology Dg Chest 2 View  Result Date: 03/01/2018 CLINICAL DATA:  Witnessed fall.  Preop. EXAM: CHEST - 2 VIEW COMPARISON:  07/16/2017 FINDINGS: Moderate-sized hiatal hernia accounting for a rounded density over the cardiac silhouette. The cardiopericardial silhouette is top-normal in size. There is minimal aortic atherosclerosis at the arch without aneurysmal dilatation. There is mild central vascular congestion without pneumothorax or pulmonary consolidation. Minimal scarring in the right upper lobe is identified. Surgical clips project over the right axilla. No acute osseous abnormality. IMPRESSION: Mild vascular congestion without acute pulmonary consolidation. Aortic atherosclerosis without aneurysm. Moderate-sized hiatal hernia. Electronically Signed   By: Ashley Royalty M.D.   On: 03/01/2018 19:18   Ct Head Wo Contrast  Result Date: 03/01/2018 CLINICAL DATA:  Witnessed fall by a neighbor after tripping over a cane. No loss of consciousness. Contusion of the left forehead with 1/2 inch laceration. EXAM: CT HEAD WITHOUT CONTRAST CT  MAXILLOFACIAL WITHOUT CONTRAST CT CERVICAL SPINE WITHOUT CONTRAST TECHNIQUE: Multidetector CT imaging of the head, cervical spine, and maxillofacial structures were performed using the standard protocol without intravenous contrast. Multiplanar CT image reconstructions of the cervical spine and maxillofacial structures were also generated. COMPARISON:  08/13/2017 CT head, 10/02/2015 CT cervical spine FINDINGS: CT HEAD FINDINGS BRAIN: There is sulcal and ventricular prominence consistent with superficial and central atrophy. No intraparenchymal hemorrhage, mass effect nor midline shift. Periventricular and subcortical white matter hypodensities consistent with chronic small vessel ischemic disease are identified. Idiopathic bilateral basal ganglial calcifications. No acute large vascular territory infarcts. No abnormal extra-axial fluid collections. Basal cisterns are not effaced and midline. VASCULAR: No hyperdense vessel sign. Mild calcific atherosclerosis of the carotid siphons. SKULL: No skull fracture. No significant scalp soft tissue swelling. OTHER: Left frontal/supraorbital scalp hematoma. CT MAXILLOFACIAL FINDINGS Osseous: No fracture or mandibular dislocation. No destructive process. Orbits: Bilateral lens replacements. Intact orbits and globes. No retrobulbar hemorrhage nor acute abnormality. Sinuses: No acute sinus disease nor fluid levels. Minimal ethmoid sinus mucosal thickening. Clear bilateral mastoids. Soft tissues: Left supraorbital soft tissue hematoma. CT CERVICAL SPINE FINDINGS Alignment: Mild dextroconvex curvature of  the cervical spine likely positional. Maintained cervical lordosis. Skull base and vertebrae: Erosive change about the odontoid which can be seen in inflammatory arthritis. Intact skull base and craniocervical relationship. Joint space narrowing spurring about the atlantodental interval. No acute cervical spine fracture or suspicious osseous lesions. Soft tissues and spinal canal:  No prevertebral soft tissue swelling. No visible canal hematoma. Disc levels: Mild disc space narrowing C3 through C4, moderate disc space narrowing C4-5 and marked at C5-6 and C6-7. Central disc bulges noted at C2-3 and C3-4 with disc-osteophyte complexes from C4-5 through C6-7. Small posterior marginal osteophytes are noted from C4 through C7 with associated uncovertebral joint osteoarthritis and uncinate spurring. There is mild to moderate left-sided foraminal encroachment on the left at C3-4 and C4-5 from facet and uncovertebral joint osteoarthritis. Upper chest: Minimal atherosclerosis of the included aortic arch. Minimal debris within the upper thoracic esophagus. Clear lung apices with minimal atelectasis and/or scarring. Other: None IMPRESSION: 1. Atrophy with chronic small vessel ischemia. 2. Left supraorbital soft tissue swelling with hematoma. No underlying facial or skull fracture. 3. Cervical spondylosis without acute posttraumatic fracture or listhesis. Electronically Signed   By: Ashley Royalty M.D.   On: 03/01/2018 17:59   Ct Cervical Spine Wo Contrast  Result Date: 03/01/2018 CLINICAL DATA:  Witnessed fall by a neighbor after tripping over a cane. No loss of consciousness. Contusion of the left forehead with 1/2 inch laceration. EXAM: CT HEAD WITHOUT CONTRAST CT MAXILLOFACIAL WITHOUT CONTRAST CT CERVICAL SPINE WITHOUT CONTRAST TECHNIQUE: Multidetector CT imaging of the head, cervical spine, and maxillofacial structures were performed using the standard protocol without intravenous contrast. Multiplanar CT image reconstructions of the cervical spine and maxillofacial structures were also generated. COMPARISON:  08/13/2017 CT head, 10/02/2015 CT cervical spine FINDINGS: CT HEAD FINDINGS BRAIN: There is sulcal and ventricular prominence consistent with superficial and central atrophy. No intraparenchymal hemorrhage, mass effect nor midline shift. Periventricular and subcortical white matter  hypodensities consistent with chronic small vessel ischemic disease are identified. Idiopathic bilateral basal ganglial calcifications. No acute large vascular territory infarcts. No abnormal extra-axial fluid collections. Basal cisterns are not effaced and midline. VASCULAR: No hyperdense vessel sign. Mild calcific atherosclerosis of the carotid siphons. SKULL: No skull fracture. No significant scalp soft tissue swelling. OTHER: Left frontal/supraorbital scalp hematoma. CT MAXILLOFACIAL FINDINGS Osseous: No fracture or mandibular dislocation. No destructive process. Orbits: Bilateral lens replacements. Intact orbits and globes. No retrobulbar hemorrhage nor acute abnormality. Sinuses: No acute sinus disease nor fluid levels. Minimal ethmoid sinus mucosal thickening. Clear bilateral mastoids. Soft tissues: Left supraorbital soft tissue hematoma. CT CERVICAL SPINE FINDINGS Alignment: Mild dextroconvex curvature of the cervical spine likely positional. Maintained cervical lordosis. Skull base and vertebrae: Erosive change about the odontoid which can be seen in inflammatory arthritis. Intact skull base and craniocervical relationship. Joint space narrowing spurring about the atlantodental interval. No acute cervical spine fracture or suspicious osseous lesions. Soft tissues and spinal canal: No prevertebral soft tissue swelling. No visible canal hematoma. Disc levels: Mild disc space narrowing C3 through C4, moderate disc space narrowing C4-5 and marked at C5-6 and C6-7. Central disc bulges noted at C2-3 and C3-4 with disc-osteophyte complexes from C4-5 through C6-7. Small posterior marginal osteophytes are noted from C4 through C7 with associated uncovertebral joint osteoarthritis and uncinate spurring. There is mild to moderate left-sided foraminal encroachment on the left at C3-4 and C4-5 from facet and uncovertebral joint osteoarthritis. Upper chest: Minimal atherosclerosis of the included aortic arch. Minimal  debris within the  upper thoracic esophagus. Clear lung apices with minimal atelectasis and/or scarring. Other: None IMPRESSION: 1. Atrophy with chronic small vessel ischemia. 2. Left supraorbital soft tissue swelling with hematoma. No underlying facial or skull fracture. 3. Cervical spondylosis without acute posttraumatic fracture or listhesis. Electronically Signed   By: Ashley Royalty M.D.   On: 03/01/2018 17:59   Dg Shoulder Left  Result Date: 03/01/2018 CLINICAL DATA:  Left shoulder pain after fall EXAM: LEFT SHOULDER - 2+ VIEW COMPARISON:  None. FINDINGS: There is no evidence of fracture or dislocation. AC and glenohumeral joints are maintained. No abnormal soft tissue mass or mineralization. The adjacent ribs and lung are nonacute. There is aortic atherosclerosis at the arch without aneurysm. IMPRESSION: No acute osseous abnormality. Electronically Signed   By: Ashley Royalty M.D.   On: 03/01/2018 20:39   Dg Hip Unilat W Or Wo Pelvis 2-3 Views Left  Result Date: 03/01/2018 CLINICAL DATA:  Tripped over cane, witnessed fall. EXAM: DG HIP (WITH OR WITHOUT PELVIS) 2-3V LEFT COMPARISON:  None. FINDINGS: There is no evidence of hip fracture or dislocation. There is no evidence of arthropathy or other focal bone abnormality. Mild vascular calcifications. Lumbar scoliosis and degenerative change. IMPRESSION: Negative. Electronically Signed   By: Elon Alas M.D.   On: 03/01/2018 19:14   Ct Maxillofacial Wo Contrast  Result Date: 03/01/2018 CLINICAL DATA:  Witnessed fall by a neighbor after tripping over a cane. No loss of consciousness. Contusion of the left forehead with 1/2 inch laceration. EXAM: CT HEAD WITHOUT CONTRAST CT MAXILLOFACIAL WITHOUT CONTRAST CT CERVICAL SPINE WITHOUT CONTRAST TECHNIQUE: Multidetector CT imaging of the head, cervical spine, and maxillofacial structures were performed using the standard protocol without intravenous contrast. Multiplanar CT image reconstructions of the cervical  spine and maxillofacial structures were also generated. COMPARISON:  08/13/2017 CT head, 10/02/2015 CT cervical spine FINDINGS: CT HEAD FINDINGS BRAIN: There is sulcal and ventricular prominence consistent with superficial and central atrophy. No intraparenchymal hemorrhage, mass effect nor midline shift. Periventricular and subcortical white matter hypodensities consistent with chronic small vessel ischemic disease are identified. Idiopathic bilateral basal ganglial calcifications. No acute large vascular territory infarcts. No abnormal extra-axial fluid collections. Basal cisterns are not effaced and midline. VASCULAR: No hyperdense vessel sign. Mild calcific atherosclerosis of the carotid siphons. SKULL: No skull fracture. No significant scalp soft tissue swelling. OTHER: Left frontal/supraorbital scalp hematoma. CT MAXILLOFACIAL FINDINGS Osseous: No fracture or mandibular dislocation. No destructive process. Orbits: Bilateral lens replacements. Intact orbits and globes. No retrobulbar hemorrhage nor acute abnormality. Sinuses: No acute sinus disease nor fluid levels. Minimal ethmoid sinus mucosal thickening. Clear bilateral mastoids. Soft tissues: Left supraorbital soft tissue hematoma. CT CERVICAL SPINE FINDINGS Alignment: Mild dextroconvex curvature of the cervical spine likely positional. Maintained cervical lordosis. Skull base and vertebrae: Erosive change about the odontoid which can be seen in inflammatory arthritis. Intact skull base and craniocervical relationship. Joint space narrowing spurring about the atlantodental interval. No acute cervical spine fracture or suspicious osseous lesions. Soft tissues and spinal canal: No prevertebral soft tissue swelling. No visible canal hematoma. Disc levels: Mild disc space narrowing C3 through C4, moderate disc space narrowing C4-5 and marked at C5-6 and C6-7. Central disc bulges noted at C2-3 and C3-4 with disc-osteophyte complexes from C4-5 through C6-7. Small  posterior marginal osteophytes are noted from C4 through C7 with associated uncovertebral joint osteoarthritis and uncinate spurring. There is mild to moderate left-sided foraminal encroachment on the left at C3-4 and C4-5 from facet and uncovertebral joint  osteoarthritis. Upper chest: Minimal atherosclerosis of the included aortic arch. Minimal debris within the upper thoracic esophagus. Clear lung apices with minimal atelectasis and/or scarring. Other: None IMPRESSION: 1. Atrophy with chronic small vessel ischemia. 2. Left supraorbital soft tissue swelling with hematoma. No underlying facial or skull fracture. 3. Cervical spondylosis without acute posttraumatic fracture or listhesis. Electronically Signed   By: Ashley Royalty M.D.   On: 03/01/2018 17:59    Procedures Procedures (including critical care time)  Medications Ordered in ED Medications  0.9 %  sodium chloride infusion ( Intravenous New Bag/Given 03/01/18 1934)  acetaminophen (TYLENOL) tablet 650 mg (650 mg Oral Given 03/01/18 1821)  lidocaine (PF) (XYLOCAINE) 1 % injection 5 mL (5 mLs Intradermal Given 03/01/18 1937)     Initial Impression / Assessment and Plan / ED Course  I have reviewed the triage vital signs and the nursing notes.  Pertinent labs & imaging results that were available during my care of the patient were reviewed by me and considered in my medical decision making (see chart for details).     Vitals:   03/01/18 1626 03/01/18 1635 03/01/18 1923 03/01/18 2000  BP:  (!) 141/65 130/72 134/81  Pulse:  66 65 68  Resp:  20 (!) 23 20  Temp:  98.2 F (36.8 C)    TempSrc:  Oral    SpO2:  96% 96% 98%  Weight: 70.8 kg (156 lb)     Height: 5' 2.5" (1.588 m)       Medications  0.9 %  sodium chloride infusion ( Intravenous New Bag/Given 03/01/18 1934)  acetaminophen (TYLENOL) tablet 650 mg (650 mg Oral Given 03/01/18 1821)  lidocaine (PF) (XYLOCAINE) 1 % injection 5 mL (5 mLs Intradermal Given 03/01/18 1937)    Hannah Galvan  is 84 y.o. female presenting with fall while walking to the mailbox earlier in the day.  She not anticoagulated she has some frontal facial trauma, no signs of entrapment.  Imaging of C-spine, maxillofacial and head negative.  She has a substantial bruise to her left side but hip and femur are intact.  She is also reporting some pain in the left shoulder, x-ray negative.  Patient is hyponatremic, discussed with Dr. Tamala Julian who will admit this patient.    Final Clinical Impressions(s) / ED Diagnoses   Final diagnoses:  Hyponatremia    ED Discharge Orders    None       Collie Wernick, Charna Elizabeth 03/01/18 2136    Davonna Belling, MD 03/01/18 2326

## 2018-03-01 NOTE — ED Notes (Signed)
Applied ice to forehead for comfort

## 2018-03-01 NOTE — ED Notes (Signed)
Pt requesting medication to help with anxiety. Pt reports to normally taking Lorazepam. Provider notified.

## 2018-03-01 NOTE — ED Notes (Signed)
[facility] Provider at bedside. EVALUATING LEFT HIP SWELLING AND BRUISING

## 2018-03-01 NOTE — ED Notes (Signed)
ED TO INPATIENT HANDOFF REPORT  Name/Age/Gender Hannah Galvan 84 y.o. female  Code Status    Code Status Orders  (From admission, onward)        Start     Ordered   03/01/18 2243  Full code  Continuous     03/01/18 2246    Code Status History    Date Active Date Inactive Code Status Order ID Comments User Context   11/17/2015 1241 11/19/2015 1600 Full Code [id number]  Almyra Deforest, [place] ED   08/22/2014 1517 08/25/2014 1444 Full Code [id number]  Rich Fuchs, PA-C Inpatient      Home/SNF/Other Home  Chief Complaint Fall; Head laceration  Level of Care/Admitting Diagnosis ED Disposition    ED Disposition Condition [facility] Area: [facility] [[id number]]  Level of Care: Telemetry [5]  Admit to tele based on following criteria: Complex arrhythmia (Bradycardia/Tachycardia)  Diagnosis: Hyponatremia [270350]  Admitting Physician: Norval Morton [[id number]]  Attending Physician: Norval Morton [[id number]]  PT Class (Do Not Modify): Observation [104]  PT Acc Code (Do Not Modify): Observation [10022]       Medical History Past Medical History:  Diagnosis Date  . Allergy   . Anxiety   . Arthritis   . Blood transfusion without reported diagnosis   . Cancer ([place])    breast, s/p RXT, bilateral  . Cataract   . History of echocardiogram    a. Echo 8/16: Vigorous LVF, EF 67-89%, grade 1 diastolic dysfunction, trivial AI, mild MR, mild RVE, normal RV function, PASP 33 mmHg // b. Echo 1/17 EF 65-70%, normal wall motion, grade 1 diastolic dysfunction, trivial AI, MAC, mild MR // c. Echo 11/26/16: mild LVH, EF 60-65, mild AI, mild MR, mild LAE, mild to mod TR  . History of stress test    Lexiscan Myoview 8/16:  EF 81%, breast attenuation, no ischemia; Low Risk // Myoview 11/26/16: EF 75, apical defect c/w soft tissue attenuation, no ischemia or scar; Low Risk  . HOCM (hypertrophic obstructive cardiomyopathy) ([place])   . Hyperlipidemia   . Hypertension    . Hypothyroidism   . Shortness of breath   . Thyroid disease     Allergies Allergies  Allergen Reactions  . Ambien [Zolpidem]     confusion  . Pyridium [Phenazopyridine Hcl] Other (See Comments)    hemolysis  . Sulfa Antibiotics Rash    IV Location/Drains/Wounds Patient Lines/Drains/Airways Status   Active Line/Drains/Airways    Name:   Placement date:   Placement time:   Site:   Days:   Peripheral IV 03/01/18 Left Wrist   03/01/18    1819    Wrist   less than 1   Wound / Incision (Open or Dehisced) 03/01/18 Other (Comment) Leg Left BRUISE   03/01/18    1805    Leg   less than 1          Labs/Imaging Results for orders placed or performed during the hospital encounter of 03/01/18 (from the past 48 hour(s))  CBC with Differential     Status: Abnormal   Collection Time: 03/01/18  6:22 PM  Result Value Ref Range   WBC 12.6 (H) 4.0 - 10.5 K/uL   RBC 4.20 3.87 - 5.11 MIL/uL   Hemoglobin 14.1 12.0 - 15.0 g/dL   HCT 39.4 36.0 - 46.0 %   MCV 93.8 78.0 - 100.0 fL   MCH 33.6 26.0 - 34.0 pg   MCHC 35.8 30.0 -  36.0 g/dL   RDW 12.6 11.5 - 15.5 %   Platelets 256 150 - 400 K/uL   Neutrophils Relative % 68 %   Neutro Abs 8.7 (H) 1.7 - 7.7 K/uL   Lymphocytes Relative 22 %   Lymphs Abs 2.8 0.7 - 4.0 K/uL   Monocytes Relative 9 %   Monocytes Absolute 1.1 (H) 0.1 - 1.0 K/uL   Eosinophils Relative 1 %   Eosinophils Absolute 0.1 0.0 - 0.7 K/uL   Basophils Relative 0 %   Basophils Absolute 0.0 0.0 - 0.1 K/uL    Comment: Performed at [facility], [place] [address], [place], [place] [address]  Basic metabolic panel     Status: Abnormal   Collection Time: 03/01/18  6:22 PM  Result Value Ref Range   Sodium 127 (L) 135 - 145 mmol/L   Potassium 3.6 3.5 - 5.1 mmol/L   Chloride 91 (L) 101 - 111 mmol/L   CO2 23 22 - 32 mmol/L   Glucose, Bld 104 (H) 65 - 99 mg/dL   BUN 16 6 - 20 mg/dL   Creatinine, Ser 0.77 0.44 - 1.00 mg/dL   Calcium 9.3 8.9 - 10.3 mg/dL   GFR calc  non Af Amer >60 >60 mL/min   GFR calc Af Amer >60 >60 mL/min    Comment: (NOTE) The eGFR has been calculated using the CKD EPI equation. This calculation has not been validated in all clinical situations. eGFR's persistently <60 mL/min signify possible Chronic Kidney Disease.    Anion gap 13 5 - 15    Comment: Performed at [facility], [place] [address], [place], [place] [address]  Brain natriuretic peptide     Status: None   Collection Time: 03/01/18  6:22 PM  Result Value Ref Range   B Natriuretic Peptide 68.1 0.0 - 100.0 pg/mL    Comment: Performed at [facility], [place] [address], [place], [place] [address]  Urinalysis, Routine w reflex microscopic     Status: None   Collection Time: 03/01/18  8:05 PM  Result Value Ref Range   Color, Urine YELLOW YELLOW   APPearance CLEAR CLEAR   Specific Gravity, Urine 1.010 1.005 - 1.030   pH 8.0 5.0 - 8.0   Glucose, UA NEGATIVE NEGATIVE mg/dL   Hgb urine dipstick NEGATIVE NEGATIVE   Bilirubin Urine NEGATIVE NEGATIVE   Ketones, ur NEGATIVE NEGATIVE mg/dL   Protein, ur NEGATIVE NEGATIVE mg/dL   Nitrite NEGATIVE NEGATIVE   Leukocytes, UA NEGATIVE NEGATIVE    Comment: Performed at [place] [address], [place], [place] [address]   Dg Chest 2 View  Result Date: 03/01/2018 CLINICAL DATA:  Witnessed fall.  Preop. EXAM: CHEST - 2 VIEW COMPARISON:  07/16/2017 FINDINGS: Moderate-sized hiatal hernia accounting for a rounded density over the cardiac silhouette. The cardiopericardial silhouette is top-normal in size. There is minimal aortic atherosclerosis at the arch without aneurysmal dilatation. There is mild central vascular congestion without pneumothorax or pulmonary consolidation. Minimal scarring in the right upper lobe is identified. Surgical clips project over the right axilla. No acute osseous abnormality. IMPRESSION: Mild vascular congestion without acute pulmonary consolidation.  Aortic atherosclerosis without aneurysm. Moderate-sized hiatal hernia. Electronically Signed   By: Ashley Royalty M.D.   On: 03/01/2018 19:18   Ct Head Wo Contrast  Result Date: 03/01/2018 CLINICAL DATA:  Witnessed fall by a neighbor after tripping over a cane. No loss of consciousness. Contusion of the left forehead with 1/2  inch laceration. EXAM: CT HEAD WITHOUT CONTRAST CT MAXILLOFACIAL WITHOUT CONTRAST CT CERVICAL SPINE WITHOUT CONTRAST TECHNIQUE: Multidetector CT imaging of the head, cervical spine, and maxillofacial structures were performed using the standard protocol without intravenous contrast. Multiplanar CT image reconstructions of the cervical spine and maxillofacial structures were also generated. COMPARISON:  08/13/2017 CT head, 10/02/2015 CT cervical spine FINDINGS: CT HEAD FINDINGS BRAIN: There is sulcal and ventricular prominence consistent with superficial and central atrophy. No intraparenchymal hemorrhage, mass effect nor midline shift. Periventricular and subcortical white matter hypodensities consistent with chronic small vessel ischemic disease are identified. Idiopathic bilateral basal ganglial calcifications. No acute large vascular territory infarcts. No abnormal extra-axial fluid collections. Basal cisterns are not effaced and midline. VASCULAR: No hyperdense vessel sign. Mild calcific atherosclerosis of the carotid siphons. SKULL: No skull fracture. No significant scalp soft tissue swelling. OTHER: Left frontal/supraorbital scalp hematoma. CT MAXILLOFACIAL FINDINGS Osseous: No fracture or mandibular dislocation. No destructive process. Orbits: Bilateral lens replacements. Intact orbits and globes. No retrobulbar hemorrhage nor acute abnormality. Sinuses: No acute sinus disease nor fluid levels. Minimal ethmoid sinus mucosal thickening. Clear bilateral mastoids. Soft tissues: Left supraorbital soft tissue hematoma. CT CERVICAL SPINE FINDINGS Alignment: Mild dextroconvex curvature of the  cervical spine likely positional. Maintained cervical lordosis. Skull base and vertebrae: Erosive change about the odontoid which can be seen in inflammatory arthritis. Intact skull base and craniocervical relationship. Joint space narrowing spurring about the atlantodental interval. No acute cervical spine fracture or suspicious osseous lesions. Soft tissues and spinal canal: No prevertebral soft tissue swelling. No visible canal hematoma. Disc levels: Mild disc space narrowing C3 through C4, moderate disc space narrowing C4-5 and marked at C5-6 and C6-7. Central disc bulges noted at C2-3 and C3-4 with disc-osteophyte complexes from C4-5 through C6-7. Small posterior marginal osteophytes are noted from C4 through C7 with associated uncovertebral joint osteoarthritis and uncinate spurring. There is mild to moderate left-sided foraminal encroachment on the left at C3-4 and C4-5 from facet and uncovertebral joint osteoarthritis. Upper chest: Minimal atherosclerosis of the included aortic arch. Minimal debris within the upper thoracic esophagus. Clear lung apices with minimal atelectasis and/or scarring. Other: None IMPRESSION: 1. Atrophy with chronic small vessel ischemia. 2. Left supraorbital soft tissue swelling with hematoma. No underlying facial or skull fracture. 3. Cervical spondylosis without acute posttraumatic fracture or listhesis. Electronically Signed   By: Ashley Royalty M.D.   On: 03/01/2018 17:59   Ct Cervical Spine Wo Contrast  Result Date: 03/01/2018 CLINICAL DATA:  Witnessed fall by a neighbor after tripping over a cane. No loss of consciousness. Contusion of the left forehead with 1/2 inch laceration. EXAM: CT HEAD WITHOUT CONTRAST CT MAXILLOFACIAL WITHOUT CONTRAST CT CERVICAL SPINE WITHOUT CONTRAST TECHNIQUE: Multidetector CT imaging of the head, cervical spine, and maxillofacial structures were performed using the standard protocol without intravenous contrast. Multiplanar CT image reconstructions  of the cervical spine and maxillofacial structures were also generated. COMPARISON:  08/13/2017 CT head, 10/02/2015 CT cervical spine FINDINGS: CT HEAD FINDINGS BRAIN: There is sulcal and ventricular prominence consistent with superficial and central atrophy. No intraparenchymal hemorrhage, mass effect nor midline shift. Periventricular and subcortical white matter hypodensities consistent with chronic small vessel ischemic disease are identified. Idiopathic bilateral basal ganglial calcifications. No acute large vascular territory infarcts. No abnormal extra-axial fluid collections. Basal cisterns are not effaced and midline. VASCULAR: No hyperdense vessel sign. Mild calcific atherosclerosis of the carotid siphons. SKULL: No skull fracture. No significant scalp soft tissue swelling. OTHER: Left frontal/supraorbital scalp  hematoma. CT MAXILLOFACIAL FINDINGS Osseous: No fracture or mandibular dislocation. No destructive process. Orbits: Bilateral lens replacements. Intact orbits and globes. No retrobulbar hemorrhage nor acute abnormality. Sinuses: No acute sinus disease nor fluid levels. Minimal ethmoid sinus mucosal thickening. Clear bilateral mastoids. Soft tissues: Left supraorbital soft tissue hematoma. CT CERVICAL SPINE FINDINGS Alignment: Mild dextroconvex curvature of the cervical spine likely positional. Maintained cervical lordosis. Skull base and vertebrae: Erosive change about the odontoid which can be seen in inflammatory arthritis. Intact skull base and craniocervical relationship. Joint space narrowing spurring about the atlantodental interval. No acute cervical spine fracture or suspicious osseous lesions. Soft tissues and spinal canal: No prevertebral soft tissue swelling. No visible canal hematoma. Disc levels: Mild disc space narrowing C3 through C4, moderate disc space narrowing C4-5 and marked at C5-6 and C6-7. Central disc bulges noted at C2-3 and C3-4 with disc-osteophyte complexes from C4-5  through C6-7. Small posterior marginal osteophytes are noted from C4 through C7 with associated uncovertebral joint osteoarthritis and uncinate spurring. There is mild to moderate left-sided foraminal encroachment on the left at C3-4 and C4-5 from facet and uncovertebral joint osteoarthritis. Upper chest: Minimal atherosclerosis of the included aortic arch. Minimal debris within the upper thoracic esophagus. Clear lung apices with minimal atelectasis and/or scarring. Other: None IMPRESSION: 1. Atrophy with chronic small vessel ischemia. 2. Left supraorbital soft tissue swelling with hematoma. No underlying facial or skull fracture. 3. Cervical spondylosis without acute posttraumatic fracture or listhesis. Electronically Signed   By: Ashley Royalty M.D.   On: 03/01/2018 17:59   Dg Shoulder Left  Result Date: 03/01/2018 CLINICAL DATA:  Left shoulder pain after fall EXAM: LEFT SHOULDER - 2+ VIEW COMPARISON:  None. FINDINGS: There is no evidence of fracture or dislocation. AC and glenohumeral joints are maintained. No abnormal soft tissue mass or mineralization. The adjacent ribs and lung are nonacute. There is aortic atherosclerosis at the arch without aneurysm. IMPRESSION: No acute osseous abnormality. Electronically Signed   By: Ashley Royalty M.D.   On: 03/01/2018 20:39   Dg Hip Unilat W Or Wo Pelvis 2-3 Views Left  Result Date: 03/01/2018 CLINICAL DATA:  Tripped over cane, witnessed fall. EXAM: DG HIP (WITH OR WITHOUT PELVIS) 2-3V LEFT COMPARISON:  None. FINDINGS: There is no evidence of hip fracture or dislocation. There is no evidence of arthropathy or other focal bone abnormality. Mild vascular calcifications. Lumbar scoliosis and degenerative change. IMPRESSION: Negative. Electronically Signed   By: Elon Alas M.D.   On: 03/01/2018 19:14   Ct Maxillofacial Wo Contrast  Result Date: 03/01/2018 CLINICAL DATA:  Witnessed fall by a neighbor after tripping over a cane. No loss of consciousness. Contusion  of the left forehead with 1/2 inch laceration. EXAM: CT HEAD WITHOUT CONTRAST CT MAXILLOFACIAL WITHOUT CONTRAST CT CERVICAL SPINE WITHOUT CONTRAST TECHNIQUE: Multidetector CT imaging of the head, cervical spine, and maxillofacial structures were performed using the standard protocol without intravenous contrast. Multiplanar CT image reconstructions of the cervical spine and maxillofacial structures were also generated. COMPARISON:  08/13/2017 CT head, 10/02/2015 CT cervical spine FINDINGS: CT HEAD FINDINGS BRAIN: There is sulcal and ventricular prominence consistent with superficial and central atrophy. No intraparenchymal hemorrhage, mass effect nor midline shift. Periventricular and subcortical white matter hypodensities consistent with chronic small vessel ischemic disease are identified. Idiopathic bilateral basal ganglial calcifications. No acute large vascular territory infarcts. No abnormal extra-axial fluid collections. Basal cisterns are not effaced and midline. VASCULAR: No hyperdense vessel sign. Mild calcific atherosclerosis of the carotid siphons.  SKULL: No skull fracture. No significant scalp soft tissue swelling. OTHER: Left frontal/supraorbital scalp hematoma. CT MAXILLOFACIAL FINDINGS Osseous: No fracture or mandibular dislocation. No destructive process. Orbits: Bilateral lens replacements. Intact orbits and globes. No retrobulbar hemorrhage nor acute abnormality. Sinuses: No acute sinus disease nor fluid levels. Minimal ethmoid sinus mucosal thickening. Clear bilateral mastoids. Soft tissues: Left supraorbital soft tissue hematoma. CT CERVICAL SPINE FINDINGS Alignment: Mild dextroconvex curvature of the cervical spine likely positional. Maintained cervical lordosis. Skull base and vertebrae: Erosive change about the odontoid which can be seen in inflammatory arthritis. Intact skull base and craniocervical relationship. Joint space narrowing spurring about the atlantodental interval. No acute  cervical spine fracture or suspicious osseous lesions. Soft tissues and spinal canal: No prevertebral soft tissue swelling. No visible canal hematoma. Disc levels: Mild disc space narrowing C3 through C4, moderate disc space narrowing C4-5 and marked at C5-6 and C6-7. Central disc bulges noted at C2-3 and C3-4 with disc-osteophyte complexes from C4-5 through C6-7. Small posterior marginal osteophytes are noted from C4 through C7 with associated uncovertebral joint osteoarthritis and uncinate spurring. There is mild to moderate left-sided foraminal encroachment on the left at C3-4 and C4-5 from facet and uncovertebral joint osteoarthritis. Upper chest: Minimal atherosclerosis of the included aortic arch. Minimal debris within the upper thoracic esophagus. Clear lung apices with minimal atelectasis and/or scarring. Other: None IMPRESSION: 1. Atrophy with chronic small vessel ischemia. 2. Left supraorbital soft tissue swelling with hematoma. No underlying facial or skull fracture. 3. Cervical spondylosis without acute posttraumatic fracture or listhesis. Electronically Signed   By: Ashley Royalty M.D.   On: 03/01/2018 17:59    Pending Labs Unresulted Labs (From admission, onward)   Start     Ordered   03/02/18 0500  CBC  Tomorrow morning,   R     03/01/18 2246   03/01/18 2248  TSH  Add-on,   R     03/01/18 2247   03/01/18 0938  Basic metabolic panel  Now then every 6 hours,   R     03/01/18 2246      Vitals/Pain Today's Vitals   03/01/18 2130 03/01/18 2141 03/01/18 2142 03/01/18 2315  BP: 138/88 (!) 142/99 (!) 142/99 (!) 149/73  Pulse: 72 72 76 72  Resp: (!) 29 (!) 21 (!) 21 (!) 21  Temp:      TempSrc:      SpO2: 98% 98% 100% 95%  Weight:      Height:      PainSc:        Isolation Precautions No active isolations  Medications Medications  levothyroxine (SYNTHROID, LEVOTHROID) tablet 125 mcg (has no administration in time range)  hydrochlorothiazide (HYDRODIURIL) tablet 25 mg (has no  administration in time range)  potassium chloride SA (K-DUR,KLOR-CON) CR tablet 20 mEq (has no administration in time range)  losartan (COZAAR) tablet 100 mg (has no administration in time range)  nebivolol (BYSTOLIC) tablet 5 mg (has no administration in time range)  sertraline (ZOLOFT) tablet 100 mg (has no administration in time range)  rosuvastatin (CRESTOR) tablet 10 mg (has no administration in time range)  LORazepam (ATIVAN) tablet 1 mg (has no administration in time range)  folic acid (FOLVITE) tablet 1 mg (has no administration in time range)  exemestane (AROMASIN) tablet 25 mg (has no administration in time range)  donepezil (ARICEPT) tablet 5 mg (has no administration in time range)  aspirin EC tablet 81 mg (has no administration in time range)  amLODipine (NORVASC) tablet  10 mg (has no administration in time range)  enoxaparin (LOVENOX) injection 40 mg (has no administration in time range)  0.9 %  sodium chloride infusion (has no administration in time range)  ondansetron (ZOFRAN) tablet 4 mg (has no administration in time range)    Or  ondansetron (ZOFRAN) injection 4 mg (has no administration in time range)  acetaminophen (TYLENOL) tablet 650 mg (has no administration in time range)    Or  acetaminophen (TYLENOL) suppository 650 mg (has no administration in time range)  acetaminophen (TYLENOL) tablet 650 mg (650 mg Oral Given 03/01/18 1821)  lidocaine (PF) (XYLOCAINE) 1 % injection 5 mL (5 mLs Intradermal Given 03/01/18 1937)  LORazepam (ATIVAN) injection 0.5 mg (0.5 mg Intravenous Given 03/01/18 2215)    Mobility walks with person assist

## 2018-03-02 ENCOUNTER — Observation Stay (HOSPITAL_COMMUNITY): Payer: Medicare Other

## 2018-03-02 DIAGNOSIS — W19XXXD Unspecified fall, subsequent encounter: Secondary | ICD-10-CM | POA: Diagnosis not present

## 2018-03-02 DIAGNOSIS — S0012XA Contusion of left eyelid and periocular area, initial encounter: Secondary | ICD-10-CM | POA: Diagnosis not present

## 2018-03-02 DIAGNOSIS — I10 Essential (primary) hypertension: Secondary | ICD-10-CM | POA: Diagnosis not present

## 2018-03-02 DIAGNOSIS — S0093XA Contusion of unspecified part of head, initial encounter: Secondary | ICD-10-CM | POA: Diagnosis present

## 2018-03-02 DIAGNOSIS — W19XXXA Unspecified fall, initial encounter: Secondary | ICD-10-CM

## 2018-03-02 DIAGNOSIS — Y92009 Unspecified place in unspecified non-institutional (private) residence as the place of occurrence of the external cause: Secondary | ICD-10-CM

## 2018-03-02 DIAGNOSIS — E871 Hypo-osmolality and hyponatremia: Secondary | ICD-10-CM | POA: Diagnosis not present

## 2018-03-02 LAB — CBC
HCT: 36.8 % (ref 36.0–46.0)
Hemoglobin: 12.9 g/dL (ref 12.0–15.0)
MCH: 33.2 pg (ref 26.0–34.0)
MCHC: 35.1 g/dL (ref 30.0–36.0)
MCV: 94.6 fL (ref 78.0–100.0)
PLATELETS: 223 10*3/uL (ref 150–400)
RBC: 3.89 MIL/uL (ref 3.87–5.11)
RDW: 12.8 % (ref 11.5–15.5)
WBC: 9.8 10*3/uL (ref 4.0–10.5)

## 2018-03-02 LAB — BASIC METABOLIC PANEL
Anion gap: 10 (ref 5–15)
Anion gap: 11 (ref 5–15)
BUN: 13 mg/dL (ref 6–20)
BUN: 12 mg/dL (ref 6–20)
CALCIUM: 8.5 mg/dL — AB (ref 8.9–10.3)
CALCIUM: 8.9 mg/dL (ref 8.9–10.3)
CO2: 23 mmol/L (ref 22–32)
CO2: 22 mmol/L (ref 22–32)
CREATININE: 0.68 mg/dL (ref 0.44–1.00)
CREATININE: 0.7 mg/dL (ref 0.44–1.00)
Chloride: 95 mmol/L — ABNORMAL LOW (ref 101–111)
Chloride: 96 mmol/L — ABNORMAL LOW (ref 101–111)
GFR calc Af Amer: >60 mL/min (ref >60)
GFR calc Af Amer: >60 mL/min (ref >60)
Glucose, Bld: 98 mg/dL (ref 65–99)
Glucose, Bld: 99 mg/dL (ref 65–99)
POTASSIUM: 3.3 mmol/L — AB (ref 3.5–5.1)
POTASSIUM: 3.8 mmol/L (ref 3.5–5.1)
SODIUM: 128 mmol/L — AB (ref 135–145)
SODIUM: 129 mmol/L — AB (ref 135–145)

## 2018-03-02 LAB — TSH: TSH: 6.15 u[IU]/mL — AB (ref 0.350–4.500)

## 2018-03-02 MED ORDER — RISPERIDONE 0.5 MG PO TABS
0.5000 mg | ORAL_TABLET | Freq: Two times a day (BID) | ORAL | Status: DC
  Administered 2018-03-02 – 2018-03-03 (×2): 0.5 mg via ORAL
  Filled 2018-03-02 (×2): qty 1

## 2018-03-02 NOTE — Care Management Obs Status (Signed)
[place] NOTIFICATION   Patient Details  Name: Hannah Galvan MRN: [id number] Date of Birth: 1931-10-24   Medicare Observation Status Notification Given:  Yes    MahabirJuliann Pulse, RN 03/02/2018, 3:07 PM

## 2018-03-02 NOTE — [place] FL2 (Signed)
[place] MEDICAID FL2 LEVEL OF CARE SCREENING TOOL     IDENTIFICATION  Patient Name: Hannah Galvan Birthdate: 25-Nov-1930 Sex: female Admission Date (Current Location): 03/01/2018  [facility] and [place] Number:  Herbalist and Address:  [facility],  [address] [place], [place]      Provider Number: [id number]  Attending Physician Name and Address:  Patrecia Pour, Christean Grief, MD  Relative Name and Phone Number:       Current Level of Care: Hospital Recommended Level of Care: [place] Prior Approval Number:    Date Approved/Denied:   PASRR Number: [id number]  Discharge Plan: SNF    Current Diagnoses: Patient Active Problem List   Diagnosis Date Noted  . Fall at home, initial encounter 03/02/2018  . Head contusion 03/02/2018  . HOCM (hypertrophic obstructive cardiomyopathy) ([place])   . Alzheimer's dementia without behavioral disturbance 01/27/2017  . Chest pain 11/17/2015  . Dyspnea 12/06/2014  . Anxiety and depression 09/03/2014  . CN (constipation) 09/03/2014  . Essential hypertension 08/28/2014  . Status post left knee replacement 08/28/2014  . Acute delirium 06/05/2013  . Hyponatremia 06/05/2013  . Acute urinary retention 06/05/2013  . Hypokalemia 06/05/2013  . Fever, unspecified 06/05/2013  . Hypothyroidism 06/05/2013  . Left knee DJD 06/02/2013    Class: Chronic  . UTI (lower urinary tract infection) 08/31/2012  . Heart murmur, aortic 07/15/2012  . Dizziness - giddy 07/15/2012  . Gait disturbance 12/21/2011  . Osteopenia 12/21/2011  . Cholelithiasis with cholecystitis without obstruction 12/21/2011  . Liver hemangioma 12/21/2011  . Pulmonary nodule, right 12/21/2011  . Breast cancer ([place]) 12/03/2011  . Edema of both legs 09/15/2011  . Thyroid disease   . Hyperlipidemia   . Arthritis   . Cancer ([place])     Orientation RESPIRATION BLADDER Height & Weight     Self, Time, Situation, Place  Normal Continent Weight:  155 lb 13.8 oz (70.7 kg) Height:  5\' 3"  (160 cm)  BEHAVIORAL SYMPTOMS/MOOD NEUROLOGICAL BOWEL NUTRITION STATUS        Diet(Heart)  AMBULATORY STATUS COMMUNICATION OF NEEDS Skin   Limited Assist Verbally Normal                       Personal Care Assistance Level of Assistance  Bathing, Feeding, Dressing Bathing Assistance: Limited assistance Feeding assistance: Independent Dressing Assistance: Limited assistance     Functional Limitations Info  Sight, Hearing, Speech Sight Info: Adequate Hearing Info: Adequate Speech Info: Adequate    SPECIAL CARE FACTORS FREQUENCY  PT (By licensed PT), OT (By licensed OT)     PT Frequency: 5x/week OT Frequency: 5x/week            Contractures Contractures Info: Not present    Additional Factors Info  Code Status, Allergies Code Status Info: Full Code Allergies Info: Ambien Zolpidem;Pyridium Phenazopyridine Hcl;Sulfa Antibiotics           Current Medications (03/02/2018):  This is the current hospital active medication list Current Facility-Administered Medications  Medication Dose Route Frequency Provider Last Rate Last Dose  . 0.9 %  sodium chloride infusion   Intravenous Continuous Fuller Plan A, MD 150 mL/hr at 03/02/18 0042    . acetaminophen (TYLENOL) tablet 650 mg  650 mg Oral Q6H PRN Fuller Plan A, MD       Or  . acetaminophen (TYLENOL) suppository 650 mg  650 mg Rectal Q6H PRN Norval Morton, MD      .  amLODipine (NORVASC) tablet 10 mg  10 mg Oral Daily Tamala Julian, Rondell A, MD   10 mg at 03/02/18 1000  . aspirin EC tablet 81 mg  81 mg Oral Daily Smith, Rondell A, MD   81 mg at 03/02/18 1000  . donepezil (ARICEPT) tablet 5 mg  5 mg Oral QHS Smith, Rondell A, MD   5 mg at 03/02/18 0039  . enoxaparin (LOVENOX) injection 40 mg  40 mg Subcutaneous QHS Smith, Rondell A, MD   40 mg at 03/02/18 0042  . exemestane (AROMASIN) tablet 25 mg  25 mg Oral Daily Smith, Rondell A, MD   25 mg at 03/02/18 1000  . folic acid  (FOLVITE) tablet 1 mg  1 mg Oral Daily Smith, Rondell A, MD   1 mg at 03/02/18 1000  . hydrochlorothiazide (HYDRODIURIL) tablet 25 mg  25 mg Oral Daily Smith, Rondell A, MD   25 mg at 03/02/18 1000  . HYDROcodone-acetaminophen (NORCO/VICODIN) 5-325 MG per tablet 1 tablet  1 tablet Oral Q6H PRN Norval Morton, MD   1 tablet at 03/02/18 0039  . levothyroxine (SYNTHROID, LEVOTHROID) tablet 125 mcg  125 mcg Oral QAC breakfast Fuller Plan A, MD   125 mcg at 03/02/18 1000  . LORazepam (ATIVAN) tablet 1 mg  1 mg Oral BID PRN Fuller Plan A, MD   1 mg at 03/02/18 0039  . losartan (COZAAR) tablet 100 mg  100 mg Oral Daily Smith, Rondell A, MD   100 mg at 03/02/18 1000  . nebivolol (BYSTOLIC) tablet 5 mg  5 mg Oral Daily Smith, Rondell A, MD   5 mg at 03/02/18 1000  . ondansetron (ZOFRAN) tablet 4 mg  4 mg Oral Q6H PRN Fuller Plan A, MD       Or  . ondansetron (ZOFRAN) injection 4 mg  4 mg Intravenous Q6H PRN Smith, Rondell A, MD      . potassium chloride SA (K-DUR,KLOR-CON) CR tablet 20 mEq  20 mEq Oral Daily Smith, Rondell A, MD   20 mEq at 03/02/18 1000  . rosuvastatin (CRESTOR) tablet 10 mg  10 mg Oral q1800 Smith, Rondell A, MD      . sertraline (ZOLOFT) tablet 100 mg  100 mg Oral Daily Tamala Julian, Rondell A, MD   100 mg at 03/02/18 1000     Discharge Medications: Please see discharge summary for a list of discharge medications.  Relevant Imaging Results:  Relevant Lab Results:   Additional Information SSN   [id number]  Burnis Medin, LCSW

## 2018-03-02 NOTE — Evaluation (Signed)
Physical Therapy Evaluation Patient Details Name: Hannah Galvan MRN: [id number] DOB: October 17, 1931 Today's Date: 03/02/2018   History of Present Illness  84 y.o. female with medical history significant of breast cancer, HFpEF, HTN, HLD, hypothyroidism, and hyponatremia; who presents after having a fall at home. Dx of hyponatremia, contusion around L eye.   Clinical Impression  Pt admitted with above diagnosis. Pt currently with functional limitations due to the deficits listed below (see PT Problem List). Min A for bed mobility and transfers, pt ambulated 16' with RW with min/guard assist for safety. Pt has had 4 falls in past year and lives alone, ST-SNF recommended. Pt will benefit from skilled PT to increase their independence and safety with mobility to allow discharge to the venue listed below.       Follow Up Recommendations SNF;Supervision for mobility/OOB    Equipment Recommendations  None recommended by PT    Recommendations for Other Services       Precautions / Restrictions Precautions Precautions: Fall Precaution Comments: son reports 4 falls in past year; pt reports one other fall in past year Restrictions Weight Bearing Restrictions: No      Mobility  Bed Mobility Overal bed mobility: Needs Assistance Bed Mobility: Supine to Sit     Supine to sit: Min assist     General bed mobility comments: with rail, increased time, min A to scoot to edge of bed  Transfers Overall transfer level: Needs assistance Equipment used: Rolling walker (2 wheeled) Transfers: Sit to/from Stand Sit to Stand: Min assist         General transfer comment: VCs hand placement, assist to rise  Ambulation/Gait Ambulation/Gait assistance: Min guard Ambulation Distance (Feet): 50 Feet Assistive device: Rolling walker (2 wheeled) Gait Pattern/deviations: Step-through pattern;Decreased stride length   Gait velocity interpretation: at or above normal speed for age/gender General Gait  Details: min/guard for safety  Stairs            Wheelchair Mobility    Modified Rankin (Stroke Patients Only)       Balance Overall balance assessment: Needs assistance;History of Falls   Sitting balance-Leahy Scale: Fair     Standing balance support: Bilateral upper extremity supported Standing balance-Leahy Scale: Poor Standing balance comment: relies on BUE support                             Pertinent Vitals/Pain Pain Assessment: 0-10 Pain Score: 2  Pain Location: L ankle with movement Pain Descriptors / Indicators: Sore Pain Intervention(s): Limited activity within patient's tolerance;Monitored during session    Home Living Family/patient expects to be discharged to:: Private residence Living Arrangements: Alone Available Help at Discharge: Family;Available PRN/intermittently(son assists as able but not available 24*) Type of Home: House Home Access: Stairs to enter   [employer] of Steps: 2 Home Layout: One level Home Equipment: Walker - 4 wheels;Bedside commode;Shower seat;Walker - 2 wheels;Cane - single point;Grab bars - tub/shower      Prior Function Level of Independence: Independent with assistive device(s)         Comments: has cane but son says she doesn't use it,son reports 4 falls in past year     Hand Dominance        Extremity/Trunk Assessment        Lower Extremity Assessment Lower Extremity Assessment: Overall WFL for tasks assessed;LLE deficits/detail(B knee ext +4/5) LLE Deficits / Details: tender dorsum L ankle, but ankle AROM WFL, pt denied  pain with ambulation, MD aware    Cervical / Trunk Assessment Cervical / Trunk Assessment: Normal  Communication   Communication: HOH  Cognition Arousal/Alertness: Awake/alert Behavior During Therapy: WFL for tasks assessed/performed Overall Cognitive Status: Within Functional Limits for tasks assessed                                         General Comments      Exercises     Assessment/Plan    PT Assessment Patient needs continued PT services  PT Problem List Decreased activity tolerance;Decreased balance;Decreased mobility       PT Treatment Interventions Gait training;DME instruction;Functional mobility training;Therapeutic activities;Balance training;Therapeutic exercise;Patient/family education    PT Goals (Current goals can be found in the Care Plan section)  Acute Rehab PT Goals Patient Stated Goal: return to independence PT Goal Formulation: With patient/family Time For Goal Achievement: 03/16/18 Potential to Achieve Goals: Good    Frequency Min 2X/week   Barriers to discharge Decreased caregiver support      Co-evaluation               AM-PAC PT "6 Clicks" Daily Activity  Outcome Measure Difficulty turning over in bed (including adjusting bedclothes, sheets and blankets)?: A Little Difficulty moving from lying on back to sitting on the side of the bed? : Unable Difficulty sitting down on and standing up from a chair with arms (e.g., wheelchair, bedside commode, etc,.)?: Unable Help needed moving to and from a bed to chair (including a wheelchair)?: A Little Help needed walking in hospital room?: A Little Help needed climbing 3-5 steps with a railing? : A Lot 6 Click Score: 13    End of Session Equipment Utilized During Treatment: Gait belt Activity Tolerance: Patient tolerated treatment well Patient left: in chair;with call bell/phone within reach;with chair alarm set Nurse Communication: Mobility status PT Visit Diagnosis: History of falling (Z91.81);Repeated falls (R29.6);Difficulty in walking, not elsewhere classified (R26.2)    Time: 6222-9798 PT Time Calculation (min) (ACUTE ONLY): 26 min   Charges:   PT Evaluation $PT Eval Low Complexity: 1 Low PT Treatments $Gait Training: 8-22 mins   PT G Codes:          Philomena Doheny 03/02/2018, 10:32  AM [phone]

## 2018-03-02 NOTE — Progress Notes (Signed)
PROGRESS NOTE [facility]   Hannah Galvan   ELF:[id number] DOB: 1931/04/11  DOA: 03/01/2018 PCP: Susy Frizzle, MD   Brief Narrative:  Hannah Galvan is a 84 year old female with medical history significant for breast cancer, diastolic dysfunction, hypertension, hypothyroidism. Presented to the emergency department after having a mechanical fall.  Patient was checking her mail box and lost balance hitting her head on the concrete.  Upon ED evaluation patient was afebrile, blood pressure was soft.  Lab workup with WBC 12.7 sodium 127 CT head showed small hematoma of the left supraorbital soft tissues.  Multiple x-rays were performed with no signs of fracture.  Patient was admitted with working diagnosis of hyponatremia and mechanical fall.  Subjective: Patient seen and examined, she has no complaints today other than weakness.  Her left eye is swelling and bruise.  Assessment & Plan: Hyponatremia Sodium upon admission 127 Current Na+ 129. Continue IV fluid If sodium of 130 can DC to SNF  Mechanical fall with head contusion Son report multiple falls at home PT evaluation recommending SNF SNF when bed available  Hematoma of the left orbit Apply ice pack  Diastolic CHF Last echo LVEF 55-60% with grade 1 diastolic dysfunction in January 2019. Patient seems to be euvolemic. Continue to monitor  Essential hypertension BP stable Continue losartan, Bystolic and amlodipine  Hypothyroidism Continue Synthroid  Anxiety/depression Continue current regimen  DVT prophylaxis: Lovenox Code Status: Full code Family Communication: Son at bedside Disposition Plan: SNF in the morning if bed available  Consultants:   None  Procedures:   None  Antimicrobials:  None   Objective: Vitals:   03/01/18 2315 03/02/18 0038 03/02/18 0526 03/02/18 1232  BP: (!) 149/73 139/68 130/60 (!) 113/52  Pulse: 72 67 (!) 59 65  Resp: (!) 21 14 18 20   Temp:  97.8 F (36.6 C) 98 F  (36.7 C) 98.1 F (36.7 C)  TempSrc:  Oral Oral Oral  SpO2: 95% 98% 95% 98%  Weight:  70.7 kg (155 lb 13.8 oz)    Height:  5\' 3"  (1.6 m)      Intake/Output Summary (Last 24 hours) at 03/02/2018 1613 Last data filed at 03/02/2018 1224 Gross per 24 hour  Intake 2791.67 ml  Output 2300 ml  Net 491.67 ml   Filed Weights   03/01/18 1626 03/02/18 0038  Weight: 70.8 kg (156 lb) 70.7 kg (155 lb 13.8 oz)    Examination:  General exam: Appears calm and comfortable  HEENT:  hematoma around left orbit with swelling, soft and mobile.  PERRLA, EOMI Respiratory system: Clear to auscultation. No wheezes,crackle or rhonchi Cardiovascular system: S1 & S2 heard, RRR. No JVD, murmurs, rubs or gallops Gastrointestinal system: Abdomen is nondistended, soft and nontender Central nervous system: Alert and oriented. No focal neurological deficits. Extremities: Left ankle mild swelling and bony tenderness on bursal surface. Skin: Multiple scratches on scalp Psychiatry: Mood & affect appropriate.    Data Reviewed: I have personally reviewed following labs and imaging studies  CBC: Recent Labs  Lab 03/01/18 1822 03/02/18 0501  WBC 12.6* 9.8  NEUTROABS 8.7*  --   HGB 14.1 12.9  HCT 39.4 36.8  MCV 93.8 94.6  PLT 256 585   Basic Metabolic Panel: Recent Labs  Lab 03/01/18 1822 03/02/18 0023 03/02/18 0501  NA 127* 128* 129*  K 3.6 3.3* 3.8  CL 91* 95* 96*  CO2 23 23 22   GLUCOSE 104* 98 99  BUN 16 13 12   CREATININE 0.77 0.68  0.70  CALCIUM 9.3 8.9 8.5*   GFR: Estimated Creatinine Clearance: 49.3 mL/min (by C-G formula based on SCr of 0.7 mg/dL). Liver Function Tests: No results for input(s): AST, ALT, ALKPHOS, BILITOT, PROT, ALBUMIN in the last 168 hours. No results for input(s): LIPASE, AMYLASE in the last 168 hours. No results for input(s): AMMONIA in the last 168 hours. Coagulation Profile: No results for input(s): INR, PROTIME in the last 168 hours. Cardiac Enzymes: No results for  input(s): CKTOTAL, CKMB, CKMBINDEX, TROPONINI in the last 168 hours. BNP (last 3 results) No results for input(s): PROBNP in the last 8760 hours. HbA1C: No results for input(s): HGBA1C in the last 72 hours. CBG: No results for input(s): GLUCAP in the last 168 hours. Lipid Profile: No results for input(s): CHOL, HDL, LDLCALC, TRIG, CHOLHDL, LDLDIRECT in the last 72 hours. Thyroid Function Tests: Recent Labs    03/02/18 0023  TSH 6.150*   Anemia Panel: No results for input(s): VITAMINB12, FOLATE, FERRITIN, TIBC, IRON, RETICCTPCT in the last 72 hours. Sepsis Labs: No results for input(s): PROCALCITON, LATICACIDVEN in the last 168 hours.  No results found for this or any previous visit (from the past 240 hour(s)).    Radiology Studies: Dg Chest 2 View  Result Date: 03/01/2018 CLINICAL DATA:  Witnessed fall.  Preop. EXAM: CHEST - 2 VIEW COMPARISON:  07/16/2017 FINDINGS: Moderate-sized hiatal hernia accounting for a rounded density over the cardiac silhouette. The cardiopericardial silhouette is top-normal in size. There is minimal aortic atherosclerosis at the arch without aneurysmal dilatation. There is mild central vascular congestion without pneumothorax or pulmonary consolidation. Minimal scarring in the right upper lobe is identified. Surgical clips project over the right axilla. No acute osseous abnormality. IMPRESSION: Mild vascular congestion without acute pulmonary consolidation. Aortic atherosclerosis without aneurysm. Moderate-sized hiatal hernia. Electronically Signed   By: Ashley Royalty M.D.   On: 03/01/2018 19:18   Dg Ankle Complete Left  Result Date: 03/02/2018 CLINICAL DATA:  Generalized left ankle pain after a fall EXAM: LEFT ANKLE COMPLETE - 3+ VIEW COMPARISON:  Left foot series of December 17, 2017 FINDINGS: The bones are subjectively adequately mineralized. There is no acute fracture nor dislocation. The ankle joint mortise is preserved. The talar dome is intact. The remainder  of the talus as well as the calcaneus are intact. There is an Achilles region calcaneal spur. The metatarsal bases are grossly normal. IMPRESSION: There is no acute bony abnormality. There is mild soft tissue swelling anteriorly and medially which is not entirely new. Electronically Signed   By: David  Martinique M.D.   On: 03/02/2018 11:24   Ct Head Wo Contrast  Result Date: 03/01/2018 CLINICAL DATA:  Witnessed fall by a neighbor after tripping over a cane. No loss of consciousness. Contusion of the left forehead with 1/2 inch laceration. EXAM: CT HEAD WITHOUT CONTRAST CT MAXILLOFACIAL WITHOUT CONTRAST CT CERVICAL SPINE WITHOUT CONTRAST TECHNIQUE: Multidetector CT imaging of the head, cervical spine, and maxillofacial structures were performed using the standard protocol without intravenous contrast. Multiplanar CT image reconstructions of the cervical spine and maxillofacial structures were also generated. COMPARISON:  08/13/2017 CT head, 10/02/2015 CT cervical spine FINDINGS: CT HEAD FINDINGS BRAIN: There is sulcal and ventricular prominence consistent with superficial and central atrophy. No intraparenchymal hemorrhage, mass effect nor midline shift. Periventricular and subcortical white matter hypodensities consistent with chronic small vessel ischemic disease are identified. Idiopathic bilateral basal ganglial calcifications. No acute large vascular territory infarcts. No abnormal extra-axial fluid collections. Basal cisterns are not effaced  and midline. VASCULAR: No hyperdense vessel sign. Mild calcific atherosclerosis of the carotid siphons. SKULL: No skull fracture. No significant scalp soft tissue swelling. OTHER: Left frontal/supraorbital scalp hematoma. CT MAXILLOFACIAL FINDINGS Osseous: No fracture or mandibular dislocation. No destructive process. Orbits: Bilateral lens replacements. Intact orbits and globes. No retrobulbar hemorrhage nor acute abnormality. Sinuses: No acute sinus disease nor fluid  levels. Minimal ethmoid sinus mucosal thickening. Clear bilateral mastoids. Soft tissues: Left supraorbital soft tissue hematoma. CT CERVICAL SPINE FINDINGS Alignment: Mild dextroconvex curvature of the cervical spine likely positional. Maintained cervical lordosis. Skull base and vertebrae: Erosive change about the odontoid which can be seen in inflammatory arthritis. Intact skull base and craniocervical relationship. Joint space narrowing spurring about the atlantodental interval. No acute cervical spine fracture or suspicious osseous lesions. Soft tissues and spinal canal: No prevertebral soft tissue swelling. No visible canal hematoma. Disc levels: Mild disc space narrowing C3 through C4, moderate disc space narrowing C4-5 and marked at C5-6 and C6-7. Central disc bulges noted at C2-3 and C3-4 with disc-osteophyte complexes from C4-5 through C6-7. Small posterior marginal osteophytes are noted from C4 through C7 with associated uncovertebral joint osteoarthritis and uncinate spurring. There is mild to moderate left-sided foraminal encroachment on the left at C3-4 and C4-5 from facet and uncovertebral joint osteoarthritis. Upper chest: Minimal atherosclerosis of the included aortic arch. Minimal debris within the upper thoracic esophagus. Clear lung apices with minimal atelectasis and/or scarring. Other: None IMPRESSION: 1. Atrophy with chronic small vessel ischemia. 2. Left supraorbital soft tissue swelling with hematoma. No underlying facial or skull fracture. 3. Cervical spondylosis without acute posttraumatic fracture or listhesis. Electronically Signed   By: Ashley Royalty M.D.   On: 03/01/2018 17:59   Ct Cervical Spine Wo Contrast  Result Date: 03/01/2018 CLINICAL DATA:  Witnessed fall by a neighbor after tripping over a cane. No loss of consciousness. Contusion of the left forehead with 1/2 inch laceration. EXAM: CT HEAD WITHOUT CONTRAST CT MAXILLOFACIAL WITHOUT CONTRAST CT CERVICAL SPINE WITHOUT CONTRAST  TECHNIQUE: Multidetector CT imaging of the head, cervical spine, and maxillofacial structures were performed using the standard protocol without intravenous contrast. Multiplanar CT image reconstructions of the cervical spine and maxillofacial structures were also generated. COMPARISON:  08/13/2017 CT head, 10/02/2015 CT cervical spine FINDINGS: CT HEAD FINDINGS BRAIN: There is sulcal and ventricular prominence consistent with superficial and central atrophy. No intraparenchymal hemorrhage, mass effect nor midline shift. Periventricular and subcortical white matter hypodensities consistent with chronic small vessel ischemic disease are identified. Idiopathic bilateral basal ganglial calcifications. No acute large vascular territory infarcts. No abnormal extra-axial fluid collections. Basal cisterns are not effaced and midline. VASCULAR: No hyperdense vessel sign. Mild calcific atherosclerosis of the carotid siphons. SKULL: No skull fracture. No significant scalp soft tissue swelling. OTHER: Left frontal/supraorbital scalp hematoma. CT MAXILLOFACIAL FINDINGS Osseous: No fracture or mandibular dislocation. No destructive process. Orbits: Bilateral lens replacements. Intact orbits and globes. No retrobulbar hemorrhage nor acute abnormality. Sinuses: No acute sinus disease nor fluid levels. Minimal ethmoid sinus mucosal thickening. Clear bilateral mastoids. Soft tissues: Left supraorbital soft tissue hematoma. CT CERVICAL SPINE FINDINGS Alignment: Mild dextroconvex curvature of the cervical spine likely positional. Maintained cervical lordosis. Skull base and vertebrae: Erosive change about the odontoid which can be seen in inflammatory arthritis. Intact skull base and craniocervical relationship. Joint space narrowing spurring about the atlantodental interval. No acute cervical spine fracture or suspicious osseous lesions. Soft tissues and spinal canal: No prevertebral soft tissue swelling. No visible canal hematoma.  Disc levels: Mild disc space narrowing C3 through C4, moderate disc space narrowing C4-5 and marked at C5-6 and C6-7. Central disc bulges noted at C2-3 and C3-4 with disc-osteophyte complexes from C4-5 through C6-7. Small posterior marginal osteophytes are noted from C4 through C7 with associated uncovertebral joint osteoarthritis and uncinate spurring. There is mild to moderate left-sided foraminal encroachment on the left at C3-4 and C4-5 from facet and uncovertebral joint osteoarthritis. Upper chest: Minimal atherosclerosis of the included aortic arch. Minimal debris within the upper thoracic esophagus. Clear lung apices with minimal atelectasis and/or scarring. Other: None IMPRESSION: 1. Atrophy with chronic small vessel ischemia. 2. Left supraorbital soft tissue swelling with hematoma. No underlying facial or skull fracture. 3. Cervical spondylosis without acute posttraumatic fracture or listhesis. Electronically Signed   By: Ashley Royalty M.D.   On: 03/01/2018 17:59   Dg Shoulder Left  Result Date: 03/01/2018 CLINICAL DATA:  Left shoulder pain after fall EXAM: LEFT SHOULDER - 2+ VIEW COMPARISON:  None. FINDINGS: There is no evidence of fracture or dislocation. AC and glenohumeral joints are maintained. No abnormal soft tissue mass or mineralization. The adjacent ribs and lung are nonacute. There is aortic atherosclerosis at the arch without aneurysm. IMPRESSION: No acute osseous abnormality. Electronically Signed   By: Ashley Royalty M.D.   On: 03/01/2018 20:39   Dg Hip Unilat W Or Wo Pelvis 2-3 Views Left  Result Date: 03/01/2018 CLINICAL DATA:  Tripped over cane, witnessed fall. EXAM: DG HIP (WITH OR WITHOUT PELVIS) 2-3V LEFT COMPARISON:  None. FINDINGS: There is no evidence of hip fracture or dislocation. There is no evidence of arthropathy or other focal bone abnormality. Mild vascular calcifications. Lumbar scoliosis and degenerative change. IMPRESSION: Negative. Electronically Signed   By: Elon Alas M.D.   On: 03/01/2018 19:14   Ct Maxillofacial Wo Contrast  Result Date: 03/01/2018 CLINICAL DATA:  Witnessed fall by a neighbor after tripping over a cane. No loss of consciousness. Contusion of the left forehead with 1/2 inch laceration. EXAM: CT HEAD WITHOUT CONTRAST CT MAXILLOFACIAL WITHOUT CONTRAST CT CERVICAL SPINE WITHOUT CONTRAST TECHNIQUE: Multidetector CT imaging of the head, cervical spine, and maxillofacial structures were performed using the standard protocol without intravenous contrast. Multiplanar CT image reconstructions of the cervical spine and maxillofacial structures were also generated. COMPARISON:  08/13/2017 CT head, 10/02/2015 CT cervical spine FINDINGS: CT HEAD FINDINGS BRAIN: There is sulcal and ventricular prominence consistent with superficial and central atrophy. No intraparenchymal hemorrhage, mass effect nor midline shift. Periventricular and subcortical white matter hypodensities consistent with chronic small vessel ischemic disease are identified. Idiopathic bilateral basal ganglial calcifications. No acute large vascular territory infarcts. No abnormal extra-axial fluid collections. Basal cisterns are not effaced and midline. VASCULAR: No hyperdense vessel sign. Mild calcific atherosclerosis of the carotid siphons. SKULL: No skull fracture. No significant scalp soft tissue swelling. OTHER: Left frontal/supraorbital scalp hematoma. CT MAXILLOFACIAL FINDINGS Osseous: No fracture or mandibular dislocation. No destructive process. Orbits: Bilateral lens replacements. Intact orbits and globes. No retrobulbar hemorrhage nor acute abnormality. Sinuses: No acute sinus disease nor fluid levels. Minimal ethmoid sinus mucosal thickening. Clear bilateral mastoids. Soft tissues: Left supraorbital soft tissue hematoma. CT CERVICAL SPINE FINDINGS Alignment: Mild dextroconvex curvature of the cervical spine likely positional. Maintained cervical lordosis. Skull base and vertebrae:  Erosive change about the odontoid which can be seen in inflammatory arthritis. Intact skull base and craniocervical relationship. Joint space narrowing spurring about the atlantodental interval. No acute cervical spine fracture or suspicious osseous lesions.  Soft tissues and spinal canal: No prevertebral soft tissue swelling. No visible canal hematoma. Disc levels: Mild disc space narrowing C3 through C4, moderate disc space narrowing C4-5 and marked at C5-6 and C6-7. Central disc bulges noted at C2-3 and C3-4 with disc-osteophyte complexes from C4-5 through C6-7. Small posterior marginal osteophytes are noted from C4 through C7 with associated uncovertebral joint osteoarthritis and uncinate spurring. There is mild to moderate left-sided foraminal encroachment on the left at C3-4 and C4-5 from facet and uncovertebral joint osteoarthritis. Upper chest: Minimal atherosclerosis of the included aortic arch. Minimal debris within the upper thoracic esophagus. Clear lung apices with minimal atelectasis and/or scarring. Other: None IMPRESSION: 1. Atrophy with chronic small vessel ischemia. 2. Left supraorbital soft tissue swelling with hematoma. No underlying facial or skull fracture. 3. Cervical spondylosis without acute posttraumatic fracture or listhesis. Electronically Signed   By: Ashley Royalty M.D.   On: 03/01/2018 17:59      Scheduled Meds: . amLODipine  10 mg Oral Daily  . aspirin EC  81 mg Oral Daily  . donepezil  5 mg Oral QHS  . enoxaparin (LOVENOX) injection  40 mg Subcutaneous QHS  . exemestane  25 mg Oral Daily  . folic acid  1 mg Oral Daily  . hydrochlorothiazide  25 mg Oral Daily  . levothyroxine  125 mcg Oral QAC breakfast  . losartan  100 mg Oral Daily  . nebivolol  5 mg Oral Daily  . potassium chloride SA  20 mEq Oral Daily  . rosuvastatin  10 mg Oral q1800  . sertraline  100 mg Oral Daily   Continuous Infusions: . sodium chloride 150 mL/hr at 03/02/18 0042     LOS: 0 days     Time spent: Total of 25 minutes spent with pt, greater than 50% of which was spent in discussion of  treatment, counseling and coordination of care  Chipper Oman, MD Pager: Text Page via [email or web address]   If 7PM-7AM, please contact night-coverage [email or web address] 03/02/2018, 4:13 PM   Note - This record has been created using [employer]. Chart creation errors have been sought, but may not always have been located. Such creation errors do not reflect on the standard of medical care.

## 2018-03-02 NOTE — Care Management Note (Signed)
Case Management Note  Patient Details  Name: KATISHA SHIMIZU MRN: [id number] Date of Birth: October 22, 1931  Subjective/Objective:  84 y/o f admitted w/fall, hyponatremia. From home alone. Hx: falls. PT recc SNF-CSW already following.                  Action/Plan:d/c SNF.   Expected Discharge Date:                  Expected Discharge Plan:  Skilled Nursing Facility  In-House Referral:  Clinical Social Work  Discharge planning Services  CM Consult  Post Acute Care Choice:    Choice offered to:     DME Arranged:    DME Agency:     HH Arranged:    [place] Agency:     Status of Service:  In process, will continue to follow  If discussed at Long Length of Stay Meetings, dates discussed:    Additional Comments:  Dessa Phi, RN 03/02/2018, 12:36 PM

## 2018-03-02 NOTE — Clinical Social Work Note (Signed)
Clinical Social Work Assessment  Patient Details  Name: Hannah Galvan MRN: [id number] Date of Birth: 10-09-1931  Date of referral:  03/02/18               Reason for consult:  Facility Placement                Permission sought to share information with:  Facility Sport and exercise psychologist, Family Supports Permission granted to share information::  Yes, Verbal Permission Granted  Name::     Hannah Galvan  Agency::     Relationship::  Son  Contact Information:  [phone]  Housing/Transportation Living arrangements for the past 2 months:  [place] of Information:  Patient, Adult Children Patient Interpreter Needed:  None Criminal Activity/Legal Involvement Pertinent to Current Situation/Hospitalization:  No - Comment as needed Significant Relationships:  Adult Children Lives with:  Self Do you feel safe going back to the place where you live?  (PT recommending SNF) Need for family participation in patient care:  Yes (Comment)  Care giving concerns:  Patient from home  Alone. Patient's son reported that patient is independent with ADLs at baseline and sometimes uses a walker or cane. Patient reported that she has someone to come clean the home and meals on wheels for lunch. Patient's son reported that he fills patient's prescriptions. Patient admitted after having a fall. Patient reported that she fell checking her mail box and that a neighbor heard her yelling for help. PT recommending SNF.    Social Worker assessment / plan:  CSW spoke with patient/patient's son at bedside regarding PT recommendation for SNF. Patient reported that she has been to SNF in the past and is agreeable. Patient's son reported that patient's preference is [facility]. CSW explained SNF placement process and agreed to follow up with [facility].   CSW completed FL2 and contacted [facility]. [facility] admissions staff Levada Dy agreed to review patient's referral.   CSW will continue to  follow and assist with discharge planning.  Employment status:  Retired Nurse, adult PT Recommendations:  [place] / Referral to community resources:  [place]  Patient/Family's Response to care:  Patient/patient's son appreciative of CSW assistance with discharge planning.  Patient/Family's Understanding of and Emotional Response to Diagnosis, Current Treatment, and Prognosis:  Patient presented calm and verbalized plan to dc to SNF for ST rehab. Patient's son involved in care and verbalized understanding of patient's diagnosis and current treatment. Patient's son in agreement with patient's plan to dc to SNF for ST rehab.  Emotional Assessment Appearance:  Appears stated age Attitude/Demeanor/Rapport:  Other(Cooperative) Affect (typically observed):  Calm Orientation:  Oriented to Self, Oriented to Situation, Oriented to Place, Oriented to  Time Alcohol / Substance use:  Not Applicable Psych involvement (Current and /or in the community):  No (Comment)  Discharge Needs  Concerns to be addressed:  Care Coordination Readmission within the last 30 days:  No Current discharge risk:  Lives alone, Physical Impairment Barriers to Discharge:  Continued Medical Work up   [employer], LCSW 03/02/2018, 1:22 PM

## 2018-03-03 DIAGNOSIS — E871 Hypo-osmolality and hyponatremia: Secondary | ICD-10-CM

## 2018-03-03 LAB — BASIC METABOLIC PANEL
Anion gap: 10 (ref 5–15)
BUN: 9 mg/dL (ref 6–20)
CALCIUM: 8.6 mg/dL — AB (ref 8.9–10.3)
CHLORIDE: 102 mmol/L (ref 101–111)
CO2: 23 mmol/L (ref 22–32)
CREATININE: 0.71 mg/dL (ref 0.44–1.00)
Glucose, Bld: 95 mg/dL (ref 65–99)
Potassium: 3.4 mmol/L — ABNORMAL LOW (ref 3.5–5.1)
SODIUM: 135 mmol/L (ref 135–145)

## 2018-03-03 MED ORDER — POTASSIUM CHLORIDE CRYS ER 20 MEQ PO TBCR
40.0000 meq | EXTENDED_RELEASE_TABLET | Freq: Once | ORAL | Status: AC
  Administered 2018-03-03: 40 meq via ORAL
  Filled 2018-03-03: qty 2

## 2018-03-03 NOTE — Clinical Social Work Placement (Signed)
Patient received and accepted bed offer at [facility]. Facility aware of patient's discharge and confirmed bed offer. [facility] contacted, patient's family notified. Patient's RN can call report to [phone] Room 201, packet complete. CSW signing off, no other needs identified at this time.  CLINICAL SOCIAL WORK PLACEMENT  NOTE  Date:  03/03/2018  Patient Details  Name: Hannah Galvan MRN: [id number] Date of Birth: [birth year]  Clinical Social Work is seeking post-discharge placement for this patient at the [place] level of care (*CSW will initial, date and re-position this form in  chart as items are completed):  Yes   Patient/family provided with [facility] Work Department's list of facilities offering this level of care within the geographic area requested by the patient (or if unable, by the patient's family).  Yes   Patient/family informed of their freedom to choose among providers that offer the needed level of care, that participate in Medicare, Medicaid or managed care program needed by the patient, have an available bed and are willing to accept the patient.  Yes   Patient/family informed of [facility]'s ownership interest in [facility] and [facility], as well as of the fact that they are under no obligation to receive care at these facilities.  PASRR submitted to EDS on       PASRR number received on       Existing PASRR number confirmed on 03/02/18     FL2 transmitted to all facilities in geographic area requested by pt/family on 03/02/18     FL2 transmitted to all facilities within larger geographic area on       Patient informed that his/her managed care company has contracts with or will negotiate with certain facilities, including the following:        Yes   Patient/family informed of bed offers received.  Patient chooses bed at [facility] [place]     Physician recommends and patient chooses bed at      Patient to be  transferred to [facility] on 03/03/18.  Patient to be transferred to facility by PTAR     Patient family notified on 03/03/18 of transfer.  Name of family member notified:  Margaretmary Bayley     PHYSICIAN       Additional Comment:    _______________________________________________ Burnis Medin, LCSW 03/03/2018, 3:40 PM

## 2018-03-03 NOTE — Discharge Summary (Addendum)
Physician Discharge Summary  LYNCOLN MASKELL SEG:[id number] DOB: 02/22/1931 DOA: 03/01/2018  PCP: Susy Frizzle, MD  Admit date: 03/01/2018 Discharge date: 03/03/2018  Admitted From: Home Disposition:  SNF   Recommendations for Outpatient Follow-up:  1. Follow up with PCP in 1 week 2. Please obtain BMP in 1 week to repeat potassium level. Hypokalemia replaced prior to discharge.  3. Repeat TSH as outpatient once acute illness resolves   Discharge Condition: Stable CODE STATUS: Full  Diet recommendation: Heart healthy   Brief/Interim Summary: Hannah Galvan is a 84 year old female with medical history significant for breast cancer, diastolic dysfunction, hypertension, hypothyroidism. Presented to the emergency department after having a mechanical fall.  Patient was checking her mail box and lost balance hitting her head on the concrete.  Upon ED evaluation patient was afebrile, blood pressure was soft.  Lab workup with WBC 12.7 sodium 127 CT head showed small hematoma of the left supraorbital soft tissues.  Multiple x-rays were performed with no signs of fracture.  Patient was admitted with working diagnosis of hyponatremia and mechanical fall.  Patient's hyponatremia resolved with IV fluids.  She worked with physical therapy who recommended skilled nursing facility placement.  Overnight on 4/9, patient did have some acute encephalopathy, without focal neurologic deficit.  She had stated to nursing that 2 ladies had taken her from the hospital to a house near [facility] where she stayed by herself.  However, when questioned, patient was alert and oriented x3 and was able to recall events surrounding her hospitalization.  This acute confusion could be secondary to hospital delirium in setting of mild dementia baseline.  Was discussed with son who is in agreement.  Discharge Diagnoses:  Principal Problem:   Hyponatremia Active Problems:   Hypothyroidism   Essential hypertension   Fall  at home, initial encounter   Head contusion   Hyponatremia Sodium upon admission 127 --> 135 Resolved   Hypokalemia Replaced, trend as outpatient   Mechanical fall with head contusion Son report multiple falls at home PT evaluation recommending SNF  Hematoma of the left orbit Apply ice pack  Diastolic CHF Last echo LVEF 55-60% with grade 1 diastolic dysfunction in January 2019. Patient seems to be euvolemic Continue to monitor  Essential hypertension BP stable Continue losartan, Bystolic, HCTZ, amlodipine  HLD Continue crestor   Hypothyroidism Continue Synthroid Repeat TSH as outpatient   Anxiety/depression Continue current regimen, zoloft, risperdal   Mild dementia Continue aricept   Discharge Instructions  Discharge Instructions    Call MD for:  difficulty breathing, headache or visual disturbances   Complete by:  As directed    Call MD for:  extreme fatigue   Complete by:  As directed    Call MD for:  persistant dizziness or light-headedness   Complete by:  As directed    Call MD for:  persistant nausea and vomiting   Complete by:  As directed    Call MD for:  severe uncontrolled pain   Complete by:  As directed    Call MD for:  temperature >100.4   Complete by:  As directed    Diet - low sodium heart healthy   Complete by:  As directed    Increase activity slowly   Complete by:  As directed      Allergies as of 03/03/2018      Reactions   Ambien [zolpidem]    confusion   Pyridium [phenazopyridine Hcl] Other (See Comments)   hemolysis  Sulfa Antibiotics Rash      Medication List    STOP taking these medications   traMADol 50 MG tablet Commonly known as:  ULTRAM     TAKE these medications   amLODipine 5 MG tablet Commonly known as:  NORVASC Take 1 tablet (5 mg total) by mouth daily. What changed:  how much to take   aspirin 81 MG tablet Take 81 mg by mouth daily.   CENTRUM SILVER ADULT 50+ PO Take 1 tablet by mouth  daily.   cholecalciferol 1000 units tablet Commonly known as:  VITAMIN D Take 1,000 Units by mouth daily.   Cranberry 250 MG Caps Take 250 mg by mouth daily.   donepezil 5 MG tablet Commonly known as:  ARICEPT TAKE 1 TABLET (5 MG TOTAL) BY MOUTH AT BEDTIME.   exemestane 25 MG tablet Commonly known as:  AROMASIN TAKE 1 TABLET BY MOUTH EVERY DAY   folic acid 1 MG tablet Commonly known as:  FOLVITE TAKE 1 TABLET BY MOUTH EVERY DAY   GNP CALCIUM 1200 1200-1000 MG-UNIT Chew 1 tab PO QAM   hydrochlorothiazide 25 MG tablet Commonly known as:  HYDRODIURIL Take 1 tablet (25 mg total) by mouth daily.   KLOR-CON M20 20 MEQ tablet Generic drug:  potassium chloride SA TAKE 1 TABLET BY MOUTH DAILY   levothyroxine 125 MCG tablet Commonly known as:  SYNTHROID, LEVOTHROID TAKE 1 TABLET BY MOUTH ONCE A DAY   LORazepam 1 MG tablet Commonly known as:  ATIVAN TAKE 1 TABLET BY MOUTH TWICE A DAY AS NEEDED FOR ANXIETY What changed:  Another medication with the same name was removed. Continue taking this medication, and follow the directions you see here.   losartan 100 MG tablet Commonly known as:  COZAAR TAKE 1 TABLET BY MOUTH EVERY DAY   nebivolol 5 MG tablet Commonly known as:  BYSTOLIC Take 1 tablet (5 mg total) by mouth daily.   nitroGLYCERIN 0.4 MG SL tablet Commonly known as:  NITROSTAT Place 1 tablet (0.4 mg total) under the tongue every 5 (five) minutes as needed for chest pain.   rosuvastatin 10 MG tablet Commonly known as:  CRESTOR TAKE 1 TABLET BY MOUTH EVERY DAY AT 6PM   sertraline 100 MG tablet Commonly known as:  ZOLOFT Take 1 tablet (100 mg total) by mouth daily. What changed:  Another medication with the same name was removed. Continue taking this medication, and follow the directions you see here.   triamcinolone cream 0.1 % Commonly known as:  KENALOG Apply 1 application topically 2 (two) times daily.       Contact information for follow-up providers     Susy Frizzle, MD. Schedule an appointment as soon as possible for a visit in 1 week(s).   Specialty:  Family Medicine Contact information: [address] [place] [address] [phone]            Contact information for after-discharge care    Destination    [facility] .   Service:  Skilled Nursing Contact information: [place] [phone]                 Allergies  Allergen Reactions  . Ambien [Zolpidem]     confusion  . Pyridium [Phenazopyridine Hcl] Other (See Comments)    hemolysis  . Sulfa Antibiotics Rash    Consultations:  None   Procedures/Studies: Dg Chest 2 View  Result Date: 03/01/2018 CLINICAL  DATA:  Witnessed fall.  Preop. EXAM: CHEST - 2 VIEW COMPARISON:  07/16/2017 FINDINGS: Moderate-sized hiatal hernia accounting for a rounded density over the cardiac silhouette. The cardiopericardial silhouette is top-normal in size. There is minimal aortic atherosclerosis at the arch without aneurysmal dilatation. There is mild central vascular congestion without pneumothorax or pulmonary consolidation. Minimal scarring in the right upper lobe is identified. Surgical clips project over the right axilla. No acute osseous abnormality. IMPRESSION: Mild vascular congestion without acute pulmonary consolidation. Aortic atherosclerosis without aneurysm. Moderate-sized hiatal hernia. Electronically Signed   By: Ashley Royalty M.D.   On: 03/01/2018 19:18   Dg Ankle Complete Left  Result Date: 03/02/2018 CLINICAL DATA:  Generalized left ankle pain after a fall EXAM: LEFT ANKLE COMPLETE - 3+ VIEW COMPARISON:  Left foot series of December 17, 2017 FINDINGS: The bones are subjectively adequately mineralized. There is no acute fracture nor dislocation. The ankle joint mortise is preserved. The talar dome is intact. The remainder of the talus as well as the calcaneus are intact. There is an Achilles  region calcaneal spur. The metatarsal bases are grossly normal. IMPRESSION: There is no acute bony abnormality. There is mild soft tissue swelling anteriorly and medially which is not entirely new. Electronically Signed   By: David  Martinique M.D.   On: 03/02/2018 11:24   Ct Head Wo Contrast  Result Date: 03/01/2018 CLINICAL DATA:  Witnessed fall by a neighbor after tripping over a cane. No loss of consciousness. Contusion of the left forehead with 1/2 inch laceration. EXAM: CT HEAD WITHOUT CONTRAST CT MAXILLOFACIAL WITHOUT CONTRAST CT CERVICAL SPINE WITHOUT CONTRAST TECHNIQUE: Multidetector CT imaging of the head, cervical spine, and maxillofacial structures were performed using the standard protocol without intravenous contrast. Multiplanar CT image reconstructions of the cervical spine and maxillofacial structures were also generated. COMPARISON:  08/13/2017 CT head, 10/02/2015 CT cervical spine FINDINGS: CT HEAD FINDINGS BRAIN: There is sulcal and ventricular prominence consistent with superficial and central atrophy. No intraparenchymal hemorrhage, mass effect nor midline shift. Periventricular and subcortical white matter hypodensities consistent with chronic small vessel ischemic disease are identified. Idiopathic bilateral basal ganglial calcifications. No acute large vascular territory infarcts. No abnormal extra-axial fluid collections. Basal cisterns are not effaced and midline. VASCULAR: No hyperdense vessel sign. Mild calcific atherosclerosis of the carotid siphons. SKULL: No skull fracture. No significant scalp soft tissue swelling. OTHER: Left frontal/supraorbital scalp hematoma. CT MAXILLOFACIAL FINDINGS Osseous: No fracture or mandibular dislocation. No destructive process. Orbits: Bilateral lens replacements. Intact orbits and globes. No retrobulbar hemorrhage nor acute abnormality. Sinuses: No acute sinus disease nor fluid levels. Minimal ethmoid sinus mucosal thickening. Clear bilateral mastoids.  Soft tissues: Left supraorbital soft tissue hematoma. CT CERVICAL SPINE FINDINGS Alignment: Mild dextroconvex curvature of the cervical spine likely positional. Maintained cervical lordosis. Skull base and vertebrae: Erosive change about the odontoid which can be seen in inflammatory arthritis. Intact skull base and craniocervical relationship. Joint space narrowing spurring about the atlantodental interval. No acute cervical spine fracture or suspicious osseous lesions. Soft tissues and spinal canal: No prevertebral soft tissue swelling. No visible canal hematoma. Disc levels: Mild disc space narrowing C3 through C4, moderate disc space narrowing C4-5 and marked at C5-6 and C6-7. Central disc bulges noted at C2-3 and C3-4 with disc-osteophyte complexes from C4-5 through C6-7. Small posterior marginal osteophytes are noted from C4 through C7 with associated uncovertebral joint osteoarthritis and uncinate spurring. There is mild to moderate left-sided foraminal encroachment on the left at C3-4 and C4-5 from  facet and uncovertebral joint osteoarthritis. Upper chest: Minimal atherosclerosis of the included aortic arch. Minimal debris within the upper thoracic esophagus. Clear lung apices with minimal atelectasis and/or scarring. Other: None IMPRESSION: 1. Atrophy with chronic small vessel ischemia. 2. Left supraorbital soft tissue swelling with hematoma. No underlying facial or skull fracture. 3. Cervical spondylosis without acute posttraumatic fracture or listhesis. Electronically Signed   By: Ashley Royalty M.D.   On: 03/01/2018 17:59   Ct Cervical Spine Wo Contrast  Result Date: 03/01/2018 CLINICAL DATA:  Witnessed fall by a neighbor after tripping over a cane. No loss of consciousness. Contusion of the left forehead with 1/2 inch laceration. EXAM: CT HEAD WITHOUT CONTRAST CT MAXILLOFACIAL WITHOUT CONTRAST CT CERVICAL SPINE WITHOUT CONTRAST TECHNIQUE: Multidetector CT imaging of the head, cervical spine, and  maxillofacial structures were performed using the standard protocol without intravenous contrast. Multiplanar CT image reconstructions of the cervical spine and maxillofacial structures were also generated. COMPARISON:  08/13/2017 CT head, 10/02/2015 CT cervical spine FINDINGS: CT HEAD FINDINGS BRAIN: There is sulcal and ventricular prominence consistent with superficial and central atrophy. No intraparenchymal hemorrhage, mass effect nor midline shift. Periventricular and subcortical white matter hypodensities consistent with chronic small vessel ischemic disease are identified. Idiopathic bilateral basal ganglial calcifications. No acute large vascular territory infarcts. No abnormal extra-axial fluid collections. Basal cisterns are not effaced and midline. VASCULAR: No hyperdense vessel sign. Mild calcific atherosclerosis of the carotid siphons. SKULL: No skull fracture. No significant scalp soft tissue swelling. OTHER: Left frontal/supraorbital scalp hematoma. CT MAXILLOFACIAL FINDINGS Osseous: No fracture or mandibular dislocation. No destructive process. Orbits: Bilateral lens replacements. Intact orbits and globes. No retrobulbar hemorrhage nor acute abnormality. Sinuses: No acute sinus disease nor fluid levels. Minimal ethmoid sinus mucosal thickening. Clear bilateral mastoids. Soft tissues: Left supraorbital soft tissue hematoma. CT CERVICAL SPINE FINDINGS Alignment: Mild dextroconvex curvature of the cervical spine likely positional. Maintained cervical lordosis. Skull base and vertebrae: Erosive change about the odontoid which can be seen in inflammatory arthritis. Intact skull base and craniocervical relationship. Joint space narrowing spurring about the atlantodental interval. No acute cervical spine fracture or suspicious osseous lesions. Soft tissues and spinal canal: No prevertebral soft tissue swelling. No visible canal hematoma. Disc levels: Mild disc space narrowing C3 through C4, moderate disc  space narrowing C4-5 and marked at C5-6 and C6-7. Central disc bulges noted at C2-3 and C3-4 with disc-osteophyte complexes from C4-5 through C6-7. Small posterior marginal osteophytes are noted from C4 through C7 with associated uncovertebral joint osteoarthritis and uncinate spurring. There is mild to moderate left-sided foraminal encroachment on the left at C3-4 and C4-5 from facet and uncovertebral joint osteoarthritis. Upper chest: Minimal atherosclerosis of the included aortic arch. Minimal debris within the upper thoracic esophagus. Clear lung apices with minimal atelectasis and/or scarring. Other: None IMPRESSION: 1. Atrophy with chronic small vessel ischemia. 2. Left supraorbital soft tissue swelling with hematoma. No underlying facial or skull fracture. 3. Cervical spondylosis without acute posttraumatic fracture or listhesis. Electronically Signed   By: Ashley Royalty M.D.   On: 03/01/2018 17:59   Dg Shoulder Left  Result Date: 03/01/2018 CLINICAL DATA:  Left shoulder pain after fall EXAM: LEFT SHOULDER - 2+ VIEW COMPARISON:  None. FINDINGS: There is no evidence of fracture or dislocation. AC and glenohumeral joints are maintained. No abnormal soft tissue mass or mineralization. The adjacent ribs and lung are nonacute. There is aortic atherosclerosis at the arch without aneurysm. IMPRESSION: No acute osseous abnormality. Electronically Signed  By: Ashley Royalty M.D.   On: 03/01/2018 20:39   Dg Hip Unilat W Or Wo Pelvis 2-3 Views Left  Result Date: 03/01/2018 CLINICAL DATA:  Tripped over cane, witnessed fall. EXAM: DG HIP (WITH OR WITHOUT PELVIS) 2-3V LEFT COMPARISON:  None. FINDINGS: There is no evidence of hip fracture or dislocation. There is no evidence of arthropathy or other focal bone abnormality. Mild vascular calcifications. Lumbar scoliosis and degenerative change. IMPRESSION: Negative. Electronically Signed   By: Elon Alas M.D.   On: 03/01/2018 19:14   Ct Maxillofacial Wo  Contrast  Result Date: 03/01/2018 CLINICAL DATA:  Witnessed fall by a neighbor after tripping over a cane. No loss of consciousness. Contusion of the left forehead with 1/2 inch laceration. EXAM: CT HEAD WITHOUT CONTRAST CT MAXILLOFACIAL WITHOUT CONTRAST CT CERVICAL SPINE WITHOUT CONTRAST TECHNIQUE: Multidetector CT imaging of the head, cervical spine, and maxillofacial structures were performed using the standard protocol without intravenous contrast. Multiplanar CT image reconstructions of the cervical spine and maxillofacial structures were also generated. COMPARISON:  08/13/2017 CT head, 10/02/2015 CT cervical spine FINDINGS: CT HEAD FINDINGS BRAIN: There is sulcal and ventricular prominence consistent with superficial and central atrophy. No intraparenchymal hemorrhage, mass effect nor midline shift. Periventricular and subcortical white matter hypodensities consistent with chronic small vessel ischemic disease are identified. Idiopathic bilateral basal ganglial calcifications. No acute large vascular territory infarcts. No abnormal extra-axial fluid collections. Basal cisterns are not effaced and midline. VASCULAR: No hyperdense vessel sign. Mild calcific atherosclerosis of the carotid siphons. SKULL: No skull fracture. No significant scalp soft tissue swelling. OTHER: Left frontal/supraorbital scalp hematoma. CT MAXILLOFACIAL FINDINGS Osseous: No fracture or mandibular dislocation. No destructive process. Orbits: Bilateral lens replacements. Intact orbits and globes. No retrobulbar hemorrhage nor acute abnormality. Sinuses: No acute sinus disease nor fluid levels. Minimal ethmoid sinus mucosal thickening. Clear bilateral mastoids. Soft tissues: Left supraorbital soft tissue hematoma. CT CERVICAL SPINE FINDINGS Alignment: Mild dextroconvex curvature of the cervical spine likely positional. Maintained cervical lordosis. Skull base and vertebrae: Erosive change about the odontoid which can be seen in  inflammatory arthritis. Intact skull base and craniocervical relationship. Joint space narrowing spurring about the atlantodental interval. No acute cervical spine fracture or suspicious osseous lesions. Soft tissues and spinal canal: No prevertebral soft tissue swelling. No visible canal hematoma. Disc levels: Mild disc space narrowing C3 through C4, moderate disc space narrowing C4-5 and marked at C5-6 and C6-7. Central disc bulges noted at C2-3 and C3-4 with disc-osteophyte complexes from C4-5 through C6-7. Small posterior marginal osteophytes are noted from C4 through C7 with associated uncovertebral joint osteoarthritis and uncinate spurring. There is mild to moderate left-sided foraminal encroachment on the left at C3-4 and C4-5 from facet and uncovertebral joint osteoarthritis. Upper chest: Minimal atherosclerosis of the included aortic arch. Minimal debris within the upper thoracic esophagus. Clear lung apices with minimal atelectasis and/or scarring. Other: None IMPRESSION: 1. Atrophy with chronic small vessel ischemia. 2. Left supraorbital soft tissue swelling with hematoma. No underlying facial or skull fracture. 3. Cervical spondylosis without acute posttraumatic fracture or listhesis. Electronically Signed   By: Ashley Royalty M.D.   On: 03/01/2018 17:59       Discharge Exam: Vitals:   03/02/18 2112 03/03/18 0447  BP: (!) 127/57 (!) 123/49  Pulse: 64 64  Resp: 18 20  Temp: 98.4 F (36.9 C) 98.6 F (37 C)  SpO2: 94% 96%    General: Pt is alert, awake, not in acute distress Cardiovascular: RRR, S1/S2 +,  no rubs, no gallops Respiratory: CTA bilaterally, no wheezing, no rhonchi Abdominal: Soft, NT, ND, bowel sounds + Extremities: no edema, no cyanosis Skin: multiples areas of bruising over face, left hip     The results of significant diagnostics from this hospitalization (including imaging, microbiology, ancillary and laboratory) are listed below for reference.      Microbiology: No results found for this or any previous visit (from the past 240 hour(s)).   Labs: BNP (last 3 results) Recent Labs    03/01/18 1822  BNP 16.1   Basic Metabolic Panel: Recent Labs  Lab 03/01/18 1822 03/02/18 0023 03/02/18 0501 03/03/18 0542  NA 127* 128* 129* 135  K 3.6 3.3* 3.8 3.4*  CL 91* 95* 96* 102  CO2 23 23 22 23   GLUCOSE 104* 98 99 95  BUN 16 13 12 9   CREATININE 0.77 0.68 0.70 0.71  CALCIUM 9.3 8.9 8.5* 8.6*   Liver Function Tests: No results for input(s): AST, ALT, ALKPHOS, BILITOT, PROT, ALBUMIN in the last 168 hours. No results for input(s): LIPASE, AMYLASE in the last 168 hours. No results for input(s): AMMONIA in the last 168 hours. CBC: Recent Labs  Lab 03/01/18 1822 03/02/18 0501  WBC 12.6* 9.8  NEUTROABS 8.7*  --   HGB 14.1 12.9  HCT 39.4 36.8  MCV 93.8 94.6  PLT 256 223   Cardiac Enzymes: No results for input(s): CKTOTAL, CKMB, CKMBINDEX, TROPONINI in the last 168 hours. BNP: Invalid input(s): POCBNP CBG: No results for input(s): GLUCAP in the last 168 hours. D-Dimer No results for input(s): DDIMER in the last 72 hours. Hgb A1c No results for input(s): HGBA1C in the last 72 hours. Lipid Profile No results for input(s): CHOL, HDL, LDLCALC, TRIG, CHOLHDL, LDLDIRECT in the last 72 hours. Thyroid function studies Recent Labs    03/02/18 0023  TSH 6.150*   Anemia work up No results for input(s): VITAMINB12, FOLATE, FERRITIN, TIBC, IRON, RETICCTPCT in the last 72 hours. Urinalysis    Component Value Date/Time   COLORURINE YELLOW 03/01/2018 2005   APPEARANCEUR CLEAR 03/01/2018 2005   LABSPEC 1.010 03/01/2018 2005   PHURINE 8.0 03/01/2018 2005   GLUCOSEU NEGATIVE 03/01/2018 2005   HGBUR NEGATIVE 03/01/2018 2005   [place] NEGATIVE 03/01/2018 2005   BILIRUBINUR neg 08/31/2012 [place] 03/01/2018 2005   PROTEINUR NEGATIVE 03/01/2018 2005   UROBILINOGEN 0.2 10/02/2015 2102   NITRITE NEGATIVE  03/01/2018 2005   LEUKOCYTESUR NEGATIVE 03/01/2018 2005   Sepsis Labs Invalid input(s): PROCALCITONIN,  WBC,  LACTICIDVEN Microbiology No results found for this or any previous visit (from the past 240 hour(s)).   Patient was seen and examined on the day of discharge and was found to be in stable condition. Time coordinating discharge: 35 minutes including assessment and coordination of care, as well as examination of the patient.   SIGNED:  Dessa Phi, DO [facility] Pager [phone]  If 7PM-7AM, please contact night-coverage [email or web address] Password [facility] 03/03/2018, 3:16 PM

## 2018-03-03 NOTE — Progress Notes (Signed)
Assumed care of patient from prior RN. I agree with previous assessment and will continue to monitor patient.   

## 2018-03-08 ENCOUNTER — Other Ambulatory Visit: Payer: Self-pay | Admitting: Family Medicine

## 2018-03-17 ENCOUNTER — Other Ambulatory Visit: Payer: Self-pay | Admitting: Family Medicine

## 2018-03-17 MED ORDER — NEBIVOLOL HCL 5 MG PO TABS: 5.0000 mg | ORAL_TABLET | Freq: Every day | ORAL | 3 refills | Status: DC

## 2018-03-24 ENCOUNTER — Telehealth: Payer: Self-pay | Admitting: Family Medicine

## 2018-03-24 NOTE — Telephone Encounter (Signed)
Pt is at [facility] - per their FL2 she is on Tramadol 50mg  bid prn (I know to dc this), Ativan .5mg  bid prn and Ativan 1mg  QHS. Which Ativan should she be taking? They have both and she has not asked for the .5mg  but because of the way it is written on FL2 she does get Ativan 1mg  every night before bed.   Fax - [phone] Phone - [phone]

## 2018-03-25 NOTE — Telephone Encounter (Signed)
Orders faxed

## 2018-03-25 NOTE — Telephone Encounter (Signed)
ONLY ativan 1 mg poqhs

## 2018-03-26 ENCOUNTER — Inpatient Hospital Stay: Payer: Medicare Other | Admitting: Family Medicine

## 2018-03-29 ENCOUNTER — Ambulatory Visit: Payer: Medicare Other | Admitting: Family Medicine

## 2018-03-30 ENCOUNTER — Encounter: Payer: Self-pay | Admitting: Family Medicine

## 2018-03-30 ENCOUNTER — Ambulatory Visit (INDEPENDENT_AMBULATORY_CARE_PROVIDER_SITE_OTHER): Payer: Medicare Other | Admitting: Family Medicine

## 2018-03-30 VITALS — BP 130/74 | HR 72 | Temp 98.0°F | Resp 14 | Ht 62.5 in | Wt 147.0 lb

## 2018-03-30 DIAGNOSIS — E871 Hypo-osmolality and hyponatremia: Secondary | ICD-10-CM | POA: Diagnosis not present

## 2018-03-30 DIAGNOSIS — F0280 Dementia in other diseases classified elsewhere without behavioral disturbance: Secondary | ICD-10-CM | POA: Diagnosis not present

## 2018-03-30 DIAGNOSIS — E876 Hypokalemia: Secondary | ICD-10-CM | POA: Diagnosis not present

## 2018-03-30 DIAGNOSIS — Z09 Encounter for follow-up examination after completed treatment for conditions other than malignant neoplasm: Secondary | ICD-10-CM | POA: Diagnosis not present

## 2018-03-30 DIAGNOSIS — G309 Alzheimer's disease, unspecified: Secondary | ICD-10-CM | POA: Diagnosis not present

## 2018-03-30 DIAGNOSIS — E039 Hypothyroidism, unspecified: Secondary | ICD-10-CM

## 2018-03-30 NOTE — Progress Notes (Signed)
Subjective:    Patient ID: Hannah Galvan, female    DOB: 1931/09/26, 84 y.o.   MRN: [id number]  HPI Recently was discharged from the hospital. I have copied relevant portions of the dc summary below for my reference:  Admit date: 03/01/2018 Discharge date: 03/03/2018  Admitted From: Home Disposition:  SNF   Recommendations for Outpatient Follow-up:  1. Follow up with PCP in 1 week 2. Please obtain BMP in 1 week to repeat potassium level. Hypokalemia replaced prior to discharge.  3. Repeat TSH as outpatient once acute illness resolves   Discharge Condition: Stable CODE STATUS: Full  Diet recommendation: Heart healthy   Brief/Interim Summary: RICHELL CORKER a 84 year old female with medical history significant for breast cancer, diastolic dysfunction, hypertension, hypothyroidism. Presented to the emergency department after having a mechanical fall. Patient was checking her mail box and lost balance hitting her head on the concrete. Upon ED evaluation patient was afebrile, blood pressure was soft. Lab workup with WBC 12.7 sodium 127 CT head showed small hematoma of the left supraorbital soft tissues. Multiple x-rays were performed with no signs of fracture.Patient was admitted with working diagnosis of hyponatremia and mechanical fall.  Patient's hyponatremia resolved with IV fluids.  She worked with physical therapy who recommended skilled nursing facility placement.  Overnight on 4/9, patient did have some acute encephalopathy, without focal neurologic deficit.  She had stated to nursing that 2 ladies had taken her from the hospital to a house near [facility] where she stayed by herself.  However, when questioned, patient was alert and oriented x3 and was able to recall events surrounding her hospitalization.  This acute confusion could be secondary to hospital delirium in setting of mild dementia baseline.  Was discussed with son who is in agreement.  Discharge  Diagnoses:  Principal Problem:   Hyponatremia Active Problems:   Hypothyroidism   Essential hypertension   Fall at home, initial encounter   Head contusion   Hyponatremia Sodium upon admission 127 --> 135 Resolved   Hypokalemia Replaced, trend as outpatient   Mechanicalfall with head contusion Son report multiple falls at home PT evaluation recommending SNF  Hematoma of the left orbit Apply ice pack  Diastolic CHF Last echo LVEF 55-60% with grade 1 diastolic dysfunction in January 2019. Patient seems to be euvolemic Continue to monitor  Essential hypertension BP stable Continue losartan, Bystolic, HCTZ, amlodipine  HLD Continue crestor   Hypothyroidism Continue Synthroid Repeat TSH as outpatient   Anxiety/depression Continue current regimen, zoloft, risperdal   Mild dementia Continue aricept    Patient is here today for follow-up.  She is completed a 3-week stay at a skilled nursing facility for rehab.  Her son has then arrange a short-term stay in an assisted living facility for further physical therapy.  However the patient is yet to receive physical therapy.  She just moved to this facility recently.  She still has significant bruising around both eyes.  She also has a large hematoma on her anterior lateral left thigh.  She is able to walk and stand however she continues to have poor balance and weakness in her legs.  Patient reports a daily headache however this is a chronic finding and usually resolves with Tylenol.  She is sleeping better using Ativan at night to help her sleep.  She is due to recheck a TSH.  She is due to recheck a sodium as well as potassium.  She denies any cough, chest pain, shortness of  breath.  There is no pitting edema in her extremities.  There is no evidence of cellulitis.  She denies any dysuria or urgency or frequency Past Medical History:  Diagnosis Date  . Allergy   . Anxiety   . Arthritis   . Blood transfusion  without reported diagnosis   . Cancer ([place])    breast, s/p RXT, bilateral  . Cataract   . History of echocardiogram    a. Echo 8/16: Vigorous LVF, EF 28-31%, grade 1 diastolic dysfunction, trivial AI, mild MR, mild RVE, normal RV function, PASP 33 mmHg // b. Echo 1/17 EF 65-70%, normal wall motion, grade 1 diastolic dysfunction, trivial AI, MAC, mild MR // c. Echo 11/26/16: mild LVH, EF 60-65, mild AI, mild MR, mild LAE, mild to mod TR  . History of stress test    Lexiscan Myoview 8/16:  EF 81%, breast attenuation, no ischemia; Low Risk // Myoview 11/26/16: EF 75, apical defect c/w soft tissue attenuation, no ischemia or scar; Low Risk  . HOCM (hypertrophic obstructive cardiomyopathy) ([place])   . Hyperlipidemia   . Hypertension   . Hypothyroidism   . Shortness of breath   . Thyroid disease    Past Surgical History:  Procedure Laterality Date  . ABDOMINAL HYSTERECTOMY  2008  . bladder tact    . BREAST LUMPECTOMY Right 2000  . BREAST LUMPECTOMY Left 2007  . CATARACT EXTRACTION  2011  . JOINT REPLACEMENT    . KNEE SURGERY     right  . ROTATOR CUFF REPAIR     right  . THYROID SURGERY  2002  . TOTAL KNEE ARTHROPLASTY Left 06/02/2013   Dr Rhona Raider  . TOTAL KNEE ARTHROPLASTY Right 06/02/2013   Procedure: RIGHT TOTAL KNEE ARTHROPLASTY;  Surgeon: Hessie Dibble, MD;  Location: [facility];  Service: Orthopedics;  Laterality: Right;  . TOTAL KNEE ARTHROPLASTY Left 08/22/2014   Procedure: TOTAL KNEE ARTHROPLASTY;  Surgeon: Hessie Dibble, MD;  Location: [facility];  Service: Orthopedics;  Laterality: Left;   Current Outpatient Medications on File Prior to Visit  Medication Sig Dispense Refill  . amLODipine (NORVASC) 5 MG tablet Take 1 tablet (5 mg total) by mouth daily. (Patient taking differently: Take 10 mg by mouth daily. ) 90 tablet 3  . aspirin 81 MG tablet Take 81 mg by mouth daily.    . Calcium Carbonate-Vit D-Min (GNP CALCIUM 1200) 1200-1000 MG-UNIT CHEW 1 tab PO QAM 30 tablet   .  cholecalciferol (VITAMIN D) 1000 units tablet Take 1,000 Units by mouth daily.    . Cranberry 250 MG CAPS Take 250 mg by mouth daily.    Marland Kitchen donepezil (ARICEPT) 5 MG tablet TAKE 1 TABLET (5 MG TOTAL) BY MOUTH AT BEDTIME. 30 tablet 5  . exemestane (AROMASIN) 25 MG tablet TAKE 1 TABLET BY MOUTH EVERY DAY 90 tablet 2  . folic acid (FOLVITE) 1 MG tablet TAKE 1 TABLET BY MOUTH EVERY DAY 90 tablet 3  . hydrochlorothiazide (HYDRODIURIL) 25 MG tablet Take 1 tablet (25 mg total) by mouth daily. 90 tablet 3  . KLOR-CON M20 20 MEQ tablet TAKE 1 TABLET BY MOUTH DAILY 90 tablet 3  . levothyroxine (SYNTHROID, LEVOTHROID) 125 MCG tablet TAKE 1 TABLET BY MOUTH ONCE A DAY 90 tablet 2  . LORazepam (ATIVAN) 1 MG tablet TAKE 1 TABLET BY MOUTH TWICE A DAY AS NEEDED FOR ANXIETY 60 tablet 1  . losartan (COZAAR) 100 MG tablet TAKE 1 TABLET BY MOUTH EVERY DAY 90 tablet 0  .  Multiple Vitamins-Minerals (CENTRUM SILVER ADULT 50+ PO) Take 1 tablet by mouth daily.     . nebivolol (BYSTOLIC) 5 MG tablet Take 1 tablet (5 mg total) by mouth daily. 90 tablet 3  . nitroGLYCERIN (NITROSTAT) 0.4 MG SL tablet Place 1 tablet (0.4 mg total) under the tongue every 5 (five) minutes as needed for chest pain. 25 tablet 3  . rosuvastatin (CRESTOR) 10 MG tablet TAKE 1 TABLET BY MOUTH EVERY DAY AT 6PM 90 tablet 3  . sertraline (ZOLOFT) 100 MG tablet Take 1 tablet (100 mg total) by mouth daily. 30 tablet 3  . triamcinolone cream (KENALOG) 0.1 % Apply 1 application topically 2 (two) times daily. 30 g 0   No current facility-administered medications on file prior to visit.    Allergies  Allergen Reactions  . Ambien [Zolpidem]     confusion  . Pyridium [Phenazopyridine Hcl] Other (See Comments)    hemolysis  . Sulfa Antibiotics Rash   Social History   Socioeconomic History  . Marital status: Widowed    Spouse name: Not on file  . Number of children: Not on file  . Years of education: Not on file  . Highest education level: Not on file   Occupational History  . Not on file  Social Needs  . Financial resource strain: Not on file  . Food insecurity:    Worry: Not on file    Inability: Not on file  . Transportation needs:    Medical: Not on file    Non-medical: Not on file  Tobacco Use  . Smoking status: Never Smoker  . Smokeless tobacco: Never Used  . Tobacco comment: never used tobacco  Substance and Sexual Activity  . Alcohol use: No    Alcohol/week: 0.0 oz  . Drug use: No  . Sexual activity: Not Currently  Lifestyle  . Physical activity:    Days per week: Not on file    Minutes per session: Not on file  . Stress: Not on file  Relationships  . Social connections:    Talks on phone: Not on file    Gets together: Not on file    Attends religious service: Not on file    Active member of club or organization: Not on file    Attends meetings of clubs or organizations: Not on file    Relationship status: Not on file  . Intimate partner violence:    Fear of current or ex partner: Not on file    Emotionally abused: Not on file    Physically abused: Not on file    Forced sexual activity: Not on file  Other Topics Concern  . Not on file  Social History Narrative  . Not on file      Review of Systems  All other systems reviewed and are negative.      Objective:   Physical Exam  Constitutional: She appears well-developed and well-nourished.  HENT:  Head:    Right Ear: External ear normal.  Left Ear: External ear normal.  Nose: Nose normal.  Mouth/Throat: Oropharynx is clear and moist.  Eyes: Pupils are equal, round, and reactive to light. Conjunctivae and EOM are normal.  Neck: Normal range of motion. Neck supple.  Cardiovascular: Normal rate and regular rhythm.  Murmur heard. Pulmonary/Chest: Effort normal and breath sounds normal. No stridor. No respiratory distress. She has no wheezes. She has no rales.  Abdominal: Soft. Bowel sounds are normal. She exhibits no distension and no mass. There  is  no tenderness. There is no rebound and no guarding. No hernia.  Musculoskeletal: She exhibits no edema.       Legs: Skin: No rash noted. No erythema. No pallor.          Assessment & Plan:  Hospital discharge follow-up - Plan: CBC with Differential/Platelet, COMPLETE METABOLIC PANEL WITH GFR, TSH  Hyponatremia - Plan: CBC with Differential/Platelet, COMPLETE METABOLIC PANEL WITH GFR, TSH  Hypokalemia - Plan: CBC with Differential/Platelet, COMPLETE METABOLIC PANEL WITH GFR, TSH  Alzheimer's dementia without behavioral disturbance, unspecified timing of dementia onset - Plan: CBC with Differential/Platelet, COMPLETE METABOLIC PANEL WITH GFR, TSH  Hypothyroidism, unspecified type - Plan: TSH  Patient is very fortunate.  Aside from some bruising on her cheeks as well as on her left upper thigh, she seems to be recovered well.  I have recommended physical therapy at her assisted living facility to continue to improve the leg weakness and poor mobility that the patient demonstrates.  I have recommended that the patient not drive.  I explained this in detail to the patient and her son.  I feel her reaction time in her dementia prevent her from operating a car safely and make her a liability on the road.  I have also recommended that she remain in assisted living facility.  I believe she is a high fall risk if she is living independently at home.  Both the patient and the son have yet to make a permanent decision.  Patient insists that she still wants to come home however at the present time her legs are too weak to allow her to ambulate independently.  My medical advice is that the patient needs 24/7 supervision

## 2018-03-31 LAB — CBC WITH DIFFERENTIAL/PLATELET
Basophils Absolute: 58 cells/uL (ref 0–200)
Basophils Relative: 0.7 %
EOS ABS: 208 {cells}/uL (ref 15–500)
EOS PCT: 2.5 %
HCT: 43.1 % (ref 35.0–45.0)
HEMOGLOBIN: 15.2 g/dL (ref 11.7–15.5)
LYMPHS ABS: 2482 {cells}/uL (ref 850–3900)
MCH: 33 pg (ref 27.0–33.0)
MCHC: 35.3 g/dL (ref 32.0–36.0)
MCV: 93.5 fL (ref 80.0–100.0)
MONOS PCT: 10.4 %
MPV: 11.1 fL (ref 7.5–12.5)
NEUTROS ABS: 4690 {cells}/uL (ref 1500–7800)
Neutrophils Relative %: 56.5 %
Platelets: 231 10*3/uL (ref 140–400)
RBC: 4.61 10*6/uL (ref 3.80–5.10)
RDW: 12.2 % (ref 11.0–15.0)
Total Lymphocyte: 29.9 %
WBC mixed population: 863 cells/uL (ref 200–950)
WBC: 8.3 10*3/uL (ref 3.8–10.8)

## 2018-03-31 LAB — COMPLETE METABOLIC PANEL WITH GFR
AG Ratio: 2 (calc) (ref 1.0–2.5)
ALBUMIN MSPROF: 4.5 g/dL (ref 3.6–5.1)
ALT: 34 U/L — ABNORMAL HIGH (ref 6–29)
AST: 33 U/L (ref 10–35)
Alkaline phosphatase (APISO): 62 U/L (ref 33–130)
BUN: 12 mg/dL (ref 7–25)
CO2: 26 mmol/L (ref 20–32)
CREATININE: 0.85 mg/dL (ref 0.60–0.88)
Calcium: 9.8 mg/dL (ref 8.6–10.4)
Chloride: 103 mmol/L (ref 98–110)
GFR, EST AFRICAN AMERICAN: 73 mL/min/{1.73_m2} (ref >=60)
GFR, EST NON AFRICAN AMERICAN: 63 mL/min/{1.73_m2} (ref >=60)
GLUCOSE: 102 mg/dL — AB (ref 65–99)
Globulin: 2.2 g/dL (calc) (ref 1.9–3.7)
Potassium: 3.9 mmol/L (ref 3.5–5.3)
Sodium: 138 mmol/L (ref 135–146)
Total Bilirubin: 0.5 mg/dL (ref 0.2–1.2)
Total Protein: 6.7 g/dL (ref 6.1–8.1)

## 2018-03-31 LAB — TSH: TSH: 0.74 m[IU]/L (ref 0.40–4.50)

## 2018-04-15 ENCOUNTER — Telehealth: Payer: Self-pay | Admitting: Family Medicine

## 2018-04-15 DIAGNOSIS — H9190 Unspecified hearing loss, unspecified ear: Secondary | ICD-10-CM

## 2018-04-15 NOTE — Telephone Encounter (Signed)
OK with referral to audiology.   (anywhere is fine).

## 2018-04-15 NOTE — Telephone Encounter (Signed)
Patients son called and left VM asking for recommendations on a doctor that can preform a hearing test on his mom. He would like a referral to the place that Dr. Dennard Schaumann recommends.  CB# [phone]

## 2018-04-15 NOTE — Telephone Encounter (Signed)
Referral orders placed.  Patient son made aware.

## 2018-04-15 NOTE — Telephone Encounter (Signed)
MD please advise

## 2018-04-23 ENCOUNTER — Other Ambulatory Visit: Payer: Self-pay | Admitting: Family Medicine

## 2018-04-23 MED ORDER — LORAZEPAM 1 MG PO TABS: ORAL_TABLET | ORAL | 1 refills | Status: DC

## 2018-04-23 MED ORDER — LORAZEPAM 1 MG PO TABS: ORAL_TABLET | ORAL | 2 refills | Status: DC

## 2018-04-23 NOTE — Addendum Note (Signed)
Addended by: Shary Decamp B on: 04/23/2018 05:01 PM   Modules accepted: Orders

## 2018-04-23 NOTE — Telephone Encounter (Signed)
[facility] called and states that we need to send her a RX to [facility] for her Lorazepam. Please send if ok.

## 2018-05-03 ENCOUNTER — Other Ambulatory Visit: Payer: Self-pay | Admitting: Family Medicine

## 2018-05-11 ENCOUNTER — Other Ambulatory Visit: Payer: Self-pay | Admitting: Family Medicine

## 2018-06-11 ENCOUNTER — Telehealth: Payer: Self-pay

## 2018-06-11 NOTE — Telephone Encounter (Addendum)
Prince Solian called and is requesting to have the patient come off of the antidepressant medication because he has noticed a big difference in the patient since being in the assisting living facility, [name] states patient has not had anxiety nor problems with depression.  [name] would also like to know if you have the xray results of patient shoulder from when she  fail last week at the assisted  living facilty going to the bathroom.

## 2018-06-11 NOTE — Telephone Encounter (Signed)
I am sorry, I  have not received an xray report regarding her shoulder.  We need to call whoever performed the xray and get the report.   She would need to wean off zoloft, do not stop abruptly.  Decrease to 50 mg qhs for 1 week, then 25 mg poqhs fro 1 week then stop.

## 2018-06-14 NOTE — Telephone Encounter (Signed)
Spoke with [name] and he is aware of provider recommendations also I spoke with Beverlee Nims at [facility] assisted living and she will have a Archivist over the xray report

## 2018-06-15 NOTE — Telephone Encounter (Signed)
rx change faxed over to [facility] as requested by Hannah Galvan

## 2018-06-28 ENCOUNTER — Ambulatory Visit: Payer: Medicare Other | Admitting: Podiatry

## 2018-07-01 ENCOUNTER — Ambulatory Visit: Payer: Medicare Other | Admitting: Podiatry

## 2018-07-05 ENCOUNTER — Other Ambulatory Visit: Payer: Self-pay | Admitting: Family Medicine

## 2018-07-05 DIAGNOSIS — Z1231 Encounter for screening mammogram for malignant neoplasm of breast: Secondary | ICD-10-CM

## 2018-07-19 ENCOUNTER — Telehealth: Payer: Self-pay | Admitting: Family Medicine

## 2018-07-19 NOTE — Telephone Encounter (Signed)
Pt's son called [facility] stating that [facility] did not get order to dc her Zoloft and he would like the order to be faxed again if possible. Order faxed as stated in phone note from 06/11/18.   Fax # - [phone]

## 2018-07-21 ENCOUNTER — Telehealth: Payer: Self-pay | Admitting: Family Medicine

## 2018-07-21 NOTE — Telephone Encounter (Signed)
Handicap placard came in mail, needs to be signed by pickard placed into green folder. pts son would like to have it mailed back to him   [place]   [place] [id number]

## 2018-07-22 NOTE — Telephone Encounter (Signed)
Form Completed and mailed to requested address

## 2018-08-10 ENCOUNTER — Ambulatory Visit
Admission: RE | Admit: 2018-08-10 | Discharge: 2018-08-10 | Disposition: A | Discharge status: Home / Self Care | Payer: Medicare Other | Source: Ambulatory Visit | Attending: Family Medicine | Admitting: Family Medicine

## 2018-08-10 DIAGNOSIS — Z1231 Encounter for screening mammogram for malignant neoplasm of breast: Secondary | ICD-10-CM

## 2018-08-17 ENCOUNTER — Ambulatory Visit: Payer: Medicare Other | Admitting: Family Medicine

## 2018-08-19 ENCOUNTER — Ambulatory Visit: Payer: Medicare Other | Admitting: Family Medicine

## 2018-08-24 ENCOUNTER — Encounter: Payer: Self-pay | Admitting: Family Medicine

## 2018-08-24 ENCOUNTER — Ambulatory Visit: Payer: Medicare Other | Admitting: Family Medicine

## 2018-08-24 VITALS — BP 164/90 | HR 68 | Temp 98.0°F | Resp 16 | Ht 62.5 in | Wt 153.0 lb

## 2018-08-24 DIAGNOSIS — Z23 Encounter for immunization: Secondary | ICD-10-CM | POA: Diagnosis not present

## 2018-08-24 DIAGNOSIS — L989 Disorder of the skin and subcutaneous tissue, unspecified: Secondary | ICD-10-CM | POA: Diagnosis not present

## 2018-08-24 DIAGNOSIS — K921 Melena: Secondary | ICD-10-CM

## 2018-08-24 LAB — COMPLETE METABOLIC PANEL WITH GFR
AG RATIO: 2.4 (calc) (ref 1.0–2.5)
ALBUMIN MSPROF: 4.6 g/dL (ref 3.6–5.1)
ALKALINE PHOSPHATASE (APISO): 53 U/L (ref 33–130)
ALT: 22 U/L (ref 6–29)
AST: 29 U/L (ref 10–35)
BUN / CREAT RATIO: 15 (calc) (ref 6–22)
BUN: 16 mg/dL (ref 7–25)
CO2: 25 mmol/L (ref 20–32)
Calcium: 10.2 mg/dL (ref 8.6–10.4)
Chloride: 101 mmol/L (ref 98–110)
Creat: 1.05 mg/dL — ABNORMAL HIGH (ref 0.60–0.88)
GFR, Est African American: 56 mL/min/{1.73_m2} — ABNORMAL LOW (ref >=60)
GFR, Est Non African American: 48 mL/min/{1.73_m2} — ABNORMAL LOW (ref >=60)
GLOBULIN: 1.9 g/dL (ref 1.9–3.7)
Glucose, Bld: 97 mg/dL (ref 65–99)
POTASSIUM: 4.2 mmol/L (ref 3.5–5.3)
SODIUM: 137 mmol/L (ref 135–146)
Total Bilirubin: 0.5 mg/dL (ref 0.2–1.2)
Total Protein: 6.5 g/dL (ref 6.1–8.1)

## 2018-08-24 LAB — CBC WITH DIFFERENTIAL/PLATELET
BASOS ABS: 62 {cells}/uL (ref 0–200)
Basophils Relative: 0.8 %
Eosinophils Absolute: 231 cells/uL (ref 15–500)
Eosinophils Relative: 3 %
HEMATOCRIT: 42.3 % (ref 35.0–45.0)
Hemoglobin: 14.7 g/dL (ref 11.7–15.5)
LYMPHS ABS: 3619 {cells}/uL (ref 850–3900)
MCH: 31.7 pg (ref 27.0–33.0)
MCHC: 34.8 g/dL (ref 32.0–36.0)
MCV: 91.4 fL (ref 80.0–100.0)
MPV: 11.5 fL (ref 7.5–12.5)
Monocytes Relative: 9.9 %
NEUTROS PCT: 39.3 %
Neutro Abs: 3026 cells/uL (ref 1500–7800)
Platelets: 194 10*3/uL (ref 140–400)
RBC: 4.63 10*6/uL (ref 3.80–5.10)
RDW: 12.4 % (ref 11.0–15.0)
Total Lymphocyte: 47 %
WBC: 7.7 10*3/uL (ref 3.8–10.8)
WBCMIX: 762 {cells}/uL (ref 200–950)

## 2018-08-24 NOTE — Progress Notes (Signed)
Subjective:    Patient ID: Hannah Galvan, female    DOB: 1930/12/12, 84 y.o.   MRN: [id number]  HPI  02/2018 Patient is here today for follow-up.  She is completed a 3-week stay at a skilled nursing facility for rehab.  Her son has then arrange a short-term stay in an assisted living facility for further physical therapy.  However the patient is yet to receive physical therapy.  She just moved to this facility recently.  She still has significant bruising around both eyes.  She also has a large hematoma on her anterior lateral left thigh.  She is able to walk and stand however she continues to have poor balance and weakness in her legs.  Patient reports a daily headache however this is a chronic finding and usually resolves with Tylenol.  She is sleeping better using Ativan at night to help her sleep.  She is due to recheck a TSH.  She is due to recheck a sodium as well as potassium.  She denies any cough, chest pain, shortness of breath.  There is no pitting edema in her extremities.  There is no evidence of cellulitis.  She denies any dysuria or urgency or frequency.  At that time, my plan was: Patient is very fortunate.  Aside from some bruising on her cheeks as well as on her left upper thigh, she seems to be recovered well.  I have recommended physical therapy at her assisted living facility to continue to improve the leg weakness and poor mobility that the patient demonstrates.  I have recommended that the patient not drive.  I explained this in detail to the patient and her son.  I feel her reaction time in her dementia prevent her from operating a car safely and make her a liability on the road.  I have also recommended that she remain in assisted living facility.  I believe she is a high fall risk if she is living independently at home.  Both the patient and the son have yet to make a permanent decision.  Patient insists that she still wants to come home however at the present time her legs are too  weak to allow her to ambulate independently.  My medical advice is that the patient needs 24/7 supervision.  08/24/18 Patient seems to be doing extremely well since moving to an assisted living facility.  She states that she has been named the resident of the month.  She is smiling.  She has many friends there.  She is very active in their activities.  This is the happiest of seeing the patient in quite some time.  She no longer seems to require the Zoloft that she was previously taking.  She is here today accompanied with her son.  Her concern is that her stool has become extremely dark.  She believes it may even be black.  It has been like this for a month.  She denies any abdominal pain.  She denies any nausea or vomiting or constipation.  She denies any acid reflux or indigestion.  She is not taking any iron pill.  She denies feeling weak or tired.  She denies any bright red blood per rectum.  She is also concerned by a suspicious lesion that is on the right side of her neck.  It is a 5 mm pink wartlike papule with black necrotic areas which is become tender to palpation.  It appears to be an irritated seborrheic keratosis.  She is requesting  excision biopsy.  She is due for her flu shot. Past Medical History:  Diagnosis Date  . Allergy   . Anxiety   . Arthritis   . Blood transfusion without reported diagnosis   . Cancer ([place])    breast, s/p RXT, bilateral  . Cataract   . History of echocardiogram    a. Echo 8/16: Vigorous LVF, EF 46-96%, grade 1 diastolic dysfunction, trivial AI, mild MR, mild RVE, normal RV function, PASP 33 mmHg // b. Echo 1/17 EF 65-70%, normal wall motion, grade 1 diastolic dysfunction, trivial AI, MAC, mild MR // c. Echo 11/26/16: mild LVH, EF 60-65, mild AI, mild MR, mild LAE, mild to mod TR  . History of stress test    Lexiscan Myoview 8/16:  EF 81%, breast attenuation, no ischemia; Low Risk // Myoview 11/26/16: EF 75, apical defect c/w soft tissue attenuation, no ischemia or  scar; Low Risk  . HOCM (hypertrophic obstructive cardiomyopathy) ([place])   . Hyperlipidemia   . Hypertension   . Hypothyroidism   . Shortness of breath   . Thyroid disease    Past Surgical History:  Procedure Laterality Date  . ABDOMINAL HYSTERECTOMY  2008  . bladder tact    . BREAST LUMPECTOMY Right 2000  . BREAST LUMPECTOMY Left 2007  . CATARACT EXTRACTION  2011  . JOINT REPLACEMENT    . KNEE SURGERY     right  . ROTATOR CUFF REPAIR     right  . THYROID SURGERY  2002  . TOTAL KNEE ARTHROPLASTY Left 06/02/2013   Dr Rhona Raider  . TOTAL KNEE ARTHROPLASTY Right 06/02/2013   Procedure: RIGHT TOTAL KNEE ARTHROPLASTY;  Surgeon: Hessie Dibble, MD;  Location: [facility];  Service: Orthopedics;  Laterality: Right;  . TOTAL KNEE ARTHROPLASTY Left 08/22/2014   Procedure: TOTAL KNEE ARTHROPLASTY;  Surgeon: Hessie Dibble, MD;  Location: [facility];  Service: Orthopedics;  Laterality: Left;   Current Outpatient Medications on File Prior to Visit  Medication Sig Dispense Refill  . amLODipine (NORVASC) 5 MG tablet TAKE 1 TABLET BY MOUTH EVERY DAY 90 tablet 3  . aspirin 81 MG tablet Take 81 mg by mouth daily.    . Calcium Carbonate-Vit D-Min (GNP CALCIUM 1200) 1200-1000 MG-UNIT CHEW 1 tab PO QAM 30 tablet   . cholecalciferol (VITAMIN D) 1000 units tablet Take 1,000 Units by mouth daily.    . Cranberry 250 MG CAPS Take 250 mg by mouth daily.    Marland Kitchen donepezil (ARICEPT) 5 MG tablet TAKE 1 TABLET (5 MG TOTAL) BY MOUTH AT BEDTIME. 30 tablet 5  . exemestane (AROMASIN) 25 MG tablet TAKE 1 TABLET BY MOUTH EVERY DAY 90 tablet 2  . folic acid (FOLVITE) 1 MG tablet TAKE 1 TABLET BY MOUTH EVERY DAY 90 tablet 3  . hydrochlorothiazide (HYDRODIURIL) 25 MG tablet Take 1 tablet (25 mg total) by mouth daily. 90 tablet 3  . KLOR-CON M20 20 MEQ tablet TAKE 1 TABLET BY MOUTH DAILY 90 tablet 3  . levothyroxine (SYNTHROID, LEVOTHROID) 125 MCG tablet TAKE 1 TABLET BY MOUTH ONCE A DAY 90 tablet 2  . LORazepam (ATIVAN) 1 MG  tablet 1 tab po qhs 30 tablet 2  . losartan (COZAAR) 100 MG tablet TAKE 1 TABLET BY MOUTH EVERY DAY 90 tablet 0  . Multiple Vitamins-Minerals (CENTRUM SILVER ADULT 50+ PO) Take 1 tablet by mouth daily.     . nebivolol (BYSTOLIC) 5 MG tablet Take 1 tablet (5 mg total) by mouth daily. 90 tablet 3  .  nitroGLYCERIN (NITROSTAT) 0.4 MG SL tablet Place 1 tablet (0.4 mg total) under the tongue every 5 (five) minutes as needed for chest pain. 25 tablet 3  . rosuvastatin (CRESTOR) 10 MG tablet TAKE 1 TABLET BY MOUTH EVERY DAY AT 6PM 90 tablet 3  . sertraline (ZOLOFT) 100 MG tablet Take 1 tablet (100 mg total) by mouth daily. (Patient taking differently: Take 100 mg by mouth daily. Pt is weaning down off this) 30 tablet 3  . triamcinolone cream (KENALOG) 0.1 % Apply 1 application topically 2 (two) times daily. 30 g 0   No current facility-administered medications on file prior to visit.    Allergies  Allergen Reactions  . Ambien [Zolpidem]     confusion  . Pyridium [Phenazopyridine Hcl] Other (See Comments)    hemolysis  . Sulfa Antibiotics Rash   Social History   Socioeconomic History  . Marital status: Widowed    Spouse name: Not on file  . Number of children: Not on file  . Years of education: Not on file  . Highest education level: Not on file  Occupational History  . Not on file  Social Needs  . Financial resource strain: Not on file  . Food insecurity:    Worry: Not on file    Inability: Not on file  . Transportation needs:    Medical: Not on file    Non-medical: Not on file  Tobacco Use  . Smoking status: Never Smoker  . Smokeless tobacco: Never Used  . Tobacco comment: never used tobacco  Substance and Sexual Activity  . Alcohol use: No    Alcohol/week: 0.0 standard drinks  . Drug use: No  . Sexual activity: Not Currently  Lifestyle  . Physical activity:    Days per week: Not on file    Minutes per session: Not on file  . Stress: Not on file  Relationships  . Social  connections:    Talks on phone: Not on file    Gets together: Not on file    Attends religious service: Not on file    Active member of club or organization: Not on file    Attends meetings of clubs or organizations: Not on file    Relationship status: Not on file  . Intimate partner violence:    Fear of current or ex partner: Not on file    Emotionally abused: Not on file    Physically abused: Not on file    Forced sexual activity: Not on file  Other Topics Concern  . Not on file  Social History Narrative  . Not on file      Review of Systems  All other systems reviewed and are negative.      Objective:   Physical Exam  Constitutional: She appears well-developed and well-nourished.  HENT:  Right Ear: External ear normal.  Left Ear: External ear normal.  Nose: Nose normal.  Mouth/Throat: Oropharynx is clear and moist.  Eyes: Pupils are equal, round, and reactive to light. Conjunctivae and EOM are normal.  Neck: Normal range of motion. Neck supple.  Cardiovascular: Normal rate and regular rhythm.  Murmur heard. Pulmonary/Chest: Effort normal and breath sounds normal. No stridor. No respiratory distress. She has no wheezes. She has no rales.  Abdominal: Soft. Bowel sounds are normal. She exhibits no distension and no mass. There is no tenderness. There is no rebound and no guarding. No hernia.  Musculoskeletal: She exhibits no edema.  Skin: No rash noted. No erythema. No pallor.  Assessment & Plan:  Melena - Plan: Fecal Globin By Immunochemistry, CBC with Differential/Platelet, COMPLETE METABOLIC PANEL WITH GFR  Skin lesion of neck - Plan: Pathology  I am concerned the patient may be experiencing melena.  I will check her stool for blood.  I will also check a CBC to evaluate for anemia or iron deficiency.  Obtain a CMP to monitor her renal function and liver function test.  She is asymptomatic other than the color of her stool.  Therefore if the stool test is  negative for blood, I would not pursue further work-up at this point.  Using sterile technique, the 5 mm skin lesion on the right side of her neck was anesthetized with 0.1% lidocaine with epinephrine.  A shave biopsy was performed and the lesion was sent to pathology in a labeled container.  Hemostasis was achieved with Drysol and a Band-Aid.  Await pathology results but I believe is an irritated seborrheic keratosis.  I will not make any other changes to her medications at this time despite elevated blood pressure.  I do recommend that she discontinue Zoloft.  Patient received her flu shot today

## 2018-08-24 NOTE — Addendum Note (Signed)
Addended by: Shary Decamp B on: 08/24/2018 03:48 PM   Modules accepted: Orders

## 2018-08-26 LAB — PATHOLOGY

## 2018-08-26 LAB — TISSUE SPECIMEN

## 2018-09-13 ENCOUNTER — Other Ambulatory Visit: Payer: Medicare Other

## 2018-09-14 LAB — FECAL GLOBIN BY IMMUNOCHEMISTRY
FECAL GLOBIN RESULT:: NOT DETECTED
MICRO NUMBER: 91262428
SPECIMEN QUALITY: ADEQUATE

## 2018-09-16 ENCOUNTER — Other Ambulatory Visit: Payer: Self-pay | Admitting: Family Medicine

## 2018-09-16 MED ORDER — LORAZEPAM 1 MG PO TABS: ORAL_TABLET | ORAL | 2 refills | Status: DC

## 2018-10-12 ENCOUNTER — Telehealth: Payer: Self-pay | Admitting: Family Medicine

## 2018-10-12 NOTE — Telephone Encounter (Signed)
mucinex 400 mg every 4 hours as needed for chest congestion and cough.  If it is a virus it will have to run its course.  Antibiotics will only help bacterial infections.  Be glad to order a CXR to look for bacterial infection such as pneumonia.

## 2018-10-12 NOTE — Telephone Encounter (Signed)
Pt's son called and states that the pt is having a lot of head & chest congestion with a non productive cough and would like for [place] to fax something into the [facility] for her before it gets worse.

## 2018-10-13 NOTE — Telephone Encounter (Signed)
Called [facility] and she actually called yesterday and made an apt with [facility] tomorrow.

## 2018-10-14 ENCOUNTER — Ambulatory Visit (INDEPENDENT_AMBULATORY_CARE_PROVIDER_SITE_OTHER): Payer: Medicare Other | Admitting: Family Medicine

## 2018-10-14 ENCOUNTER — Encounter: Payer: Self-pay | Admitting: Family Medicine

## 2018-10-14 VITALS — BP 110/68 | HR 69 | Temp 98.0°F | Ht 62.5 in | Wt 148.0 lb

## 2018-10-14 DIAGNOSIS — J209 Acute bronchitis, unspecified: Secondary | ICD-10-CM | POA: Diagnosis not present

## 2018-10-14 DIAGNOSIS — J0140 Acute pansinusitis, unspecified: Secondary | ICD-10-CM | POA: Diagnosis not present

## 2018-10-14 MED ORDER — DOXYCYCLINE HYCLATE 100 MG PO TABS
100.0000 mg | ORAL_TABLET | Freq: Two times a day (BID) | ORAL | 0 refills | Status: AC
Start: 2018-10-14 — End: 2018-10-21

## 2018-10-14 MED ORDER — PREDNISONE 20 MG PO TABS: ORAL_TABLET | ORAL | 0 refills | Status: DC

## 2018-10-14 MED ORDER — BENZONATATE 100 MG PO CAPS: 100.0000 mg | ORAL_CAPSULE | Freq: Three times a day (TID) | ORAL | 0 refills | Status: DC | PRN

## 2018-10-14 NOTE — Patient Instructions (Signed)
I will fax your paperwork to the nursing home  For now to antibiotic and steroids and cough medicine as needed  Return in 2-3 weeks if not improving  Follow up sooner if worsening

## 2018-10-14 NOTE — Progress Notes (Signed)
Patient ID: Hannah Galvan, female    DOB: [birth year], 84 y.o.   MRN: [id number]  PCP: Susy Frizzle, MD  Chief Complaint  Patient presents with  . Sore Throat    Patient has c/o sore throat, and congestion, Onset 1 week    Subjective:   Hannah Galvan is a 84 y.o. female, presents to clinic with CC of nasal congestion and discharge for 1 week causing postnasal drip and intermittent coughing to clear the secretions in the back of her nose and throat.  She denies any sore throat, denies fever, chest pain, shortness of breath, wheeze.  She has not been taking any medicines for seasonal allergies, she denies any fatigue, malaise, sweats, hot cold chills.  She does note that a few times a sputum associate with her cough has been yellow.   She does live in a nursing home, history of dementia, a close friend is here with her.  Per chart review there is diagnosis of dementia, hyperlipidemia, and I suspect some heart disease, history of lung disease, COPD or asthma. Endorses having no change in appetite or energy   Patient Active Problem List   Diagnosis Date Noted  . Fall at home, initial encounter 03/02/2018  . Head contusion 03/02/2018  . HOCM (hypertrophic obstructive cardiomyopathy) ([place])   . Alzheimer's dementia without behavioral disturbance ([place]) 01/27/2017  . Chest pain 11/17/2015  . Dyspnea 12/06/2014  . Anxiety and depression 09/03/2014  . CN (constipation) 09/03/2014  . Essential hypertension 08/28/2014  . Status post left knee replacement 08/28/2014  . Acute delirium 06/05/2013  . Hyponatremia 06/05/2013  . Acute urinary retention 06/05/2013  . Hypokalemia 06/05/2013  . Fever, unspecified 06/05/2013  . Hypothyroidism 06/05/2013  . Left knee DJD 06/02/2013    Class: Chronic  . UTI (lower urinary tract infection) 08/31/2012  . Heart murmur, aortic 07/15/2012  . Dizziness - giddy 07/15/2012  . Gait disturbance 12/21/2011  . Osteopenia 12/21/2011  . Cholelithiasis  with cholecystitis without obstruction 12/21/2011  . Liver hemangioma 12/21/2011  . Pulmonary nodule, right 12/21/2011  . Breast cancer ([place]) 12/03/2011  . Edema of both legs 09/15/2011  . Thyroid disease   . Hyperlipidemia   . Arthritis   . Cancer [facility])      Prior to Admission medications   Medication Sig Start Date End Date Taking? Authorizing Provider  amLODipine (NORVASC) 5 MG tablet TAKE 1 TABLET BY MOUTH EVERY DAY 05/03/18  Yes Susy Frizzle, MD  aspirin 81 MG tablet Take 81 mg by mouth daily.   Yes [provider]  Calcium Carbonate-Vit D-Min (GNP CALCIUM 1200) 1200-1000 MG-UNIT CHEW 1 tab PO QAM 08/05/17  Yes Susy Frizzle, MD  cholecalciferol (VITAMIN D) 1000 units tablet Take 1,000 Units by mouth daily.   Yes [provider]  Cranberry 250 MG CAPS Take 250 mg by mouth daily.   Yes [provider]  donepezil (ARICEPT) 5 MG tablet TAKE 1 TABLET (5 MG TOTAL) BY MOUTH AT BEDTIME. 06/12/17  Yes Susy Frizzle, MD  exemestane (AROMASIN) 25 MG tablet TAKE 1 TABLET BY MOUTH EVERY DAY 11/19/17  Yes Susy Frizzle, MD  folic acid (FOLVITE) 1 MG tablet TAKE 1 TABLET BY MOUTH EVERY DAY 02/04/18  Yes Susy Frizzle, MD  hydrochlorothiazide (HYDRODIURIL) 25 MG tablet Take 1 tablet (25 mg total) by mouth daily. 07/21/17  Yes Susy Frizzle, MD  KLOR-CON M20 20 MEQ tablet TAKE 1 TABLET BY MOUTH DAILY 11/10/16  Yes Susy Frizzle, MD  levothyroxine (SYNTHROID, LEVOTHROID) 125 MCG tablet TAKE 1 TABLET BY MOUTH ONCE A DAY 12/25/17  Yes Susy Frizzle, MD  LORazepam (ATIVAN) 1 MG tablet 1 tab po qhs 09/16/18  Yes Susy Frizzle, MD  losartan (COZAAR) 100 MG tablet TAKE 1 TABLET BY MOUTH EVERY DAY 05/11/18  Yes Susy Frizzle, MD  Multiple Vitamins-Minerals (CENTRUM SILVER ADULT 50+ PO) Take 1 tablet by mouth daily.    Yes [provider]  nebivolol (BYSTOLIC) 5 MG tablet Take 1 tablet (5 mg total) by mouth daily. 03/17/18  Yes Susy Frizzle, MD  rosuvastatin (CRESTOR) 10 MG tablet TAKE 1 TABLET BY MOUTH EVERY DAY AT 6PM 07/16/17  Yes Susy Frizzle, MD  sertraline (ZOLOFT) 100 MG tablet Take 1 tablet (100 mg total) by mouth daily. Patient taking differently: Take 100 mg by mouth daily. Pt is weaning down off this 02/16/18  Yes Pickard, Cammie Mcgee, MD  triamcinolone cream (KENALOG) 0.1 % Apply 1 application topically 2 (two) times daily. 02/16/18  Yes Susy Frizzle, MD  nitroGLYCERIN (NITROSTAT) 0.4 MG SL tablet Place 1 tablet (0.4 mg total) under the tongue every 5 (five) minutes as needed for chest pain. Patient not taking: Reported on 10/14/2018 11/29/15   Richardson Dopp T, PA-C     Allergies  Allergen Reactions  . Ambien [Zolpidem]     confusion  . Pyridium [Phenazopyridine Hcl] Other (See Comments)    hemolysis  . Sulfa Antibiotics Rash      Review of Systems  Constitutional: Negative.  Negative for activity change, appetite change, chills, diaphoresis, fatigue, fever and unexpected weight change.  HENT: Negative.   Eyes: Negative.   Respiratory: Positive for cough. Negative for apnea, choking, chest tightness, shortness of breath, wheezing and stridor.   Cardiovascular: Negative.  Negative for chest pain, palpitations and leg swelling.  Gastrointestinal: Negative.   Endocrine: Negative.   Genitourinary: Negative.   Musculoskeletal: Negative.   Skin: Negative.   Allergic/Immunologic: Negative.   Neurological: Negative.   Hematological: Negative.   Psychiatric/Behavioral: Negative.   All other systems reviewed and are negative.      Objective:    Vitals:   10/14/18 1114  BP: 110/68  Pulse: 69  Temp: 98 F (36.7 C)  TempSrc: Oral  SpO2: 91%  Weight: 148 lb (67.1 kg)  Height: 5' 2.5" (1.588 m)      Physical Exam  Constitutional: She appears well-developed and well-nourished.  Non-toxic appearance. She does not have a sickly appearance. She does not appear ill. No distress.  Elderly  female, alert, well-appearing, appears stated age, pleasantly confused  HENT:  Head: Normocephalic and atraumatic.  Right Ear: Hearing, tympanic membrane, external ear and ear canal normal.  Left Ear: Hearing, tympanic membrane, external ear and ear canal normal.  Nose: Mucosal edema and rhinorrhea present. Right sinus exhibits no maxillary sinus tenderness and no frontal sinus tenderness. Left sinus exhibits no maxillary sinus tenderness and no frontal sinus tenderness.  Mouth/Throat: Uvula is midline and mucous membranes are normal. Mucous membranes are not pale, not dry and not cyanotic. No uvula swelling. Posterior oropharyngeal erythema present. No oropharyngeal exudate, posterior oropharyngeal edema (mild injection) or tonsillar abscesses. No tonsillar exudate.  Erythematous nasal mucosa very congested  Eyes: Pupils are equal, round, and reactive to light. Conjunctivae are normal. Right eye exhibits no discharge. Left eye exhibits no discharge.  Neck: Normal range of motion. Neck supple. No tracheal deviation present.  Cardiovascular:  Normal rate, regular rhythm, normal heart sounds and intact distal pulses. Exam reveals no gallop and no friction rub.  No murmur heard. Pulmonary/Chest: Effort normal. No stridor. No respiratory distress. She has no decreased breath sounds. She has wheezes. She has rhonchi. She has no rales. She exhibits no tenderness.  Scattered rhonchi, faint expiratory wheeze heard intermittently, no retractions, no accessory muscle use, frequent coughing but no distress  Abdominal: Soft. Bowel sounds are normal. She exhibits no distension. There is no tenderness.  Musculoskeletal: Normal range of motion.  Lymphadenopathy:    She has no cervical adenopathy.  Neurological: She is alert. She exhibits normal muscle tone. Coordination normal.  Skin: Skin is warm and dry. No rash noted. She is not diaphoretic. No pallor.  Psychiatric: She has a normal mood and affect. Her  behavior is normal.  Nursing note and vitals reviewed.         Assessment & Plan:   Elderly female presents with URI symptoms for 1 week without fever, chills, fatigue, change in appetite, no wheeze or shortness of breath.  He is well-appearing, alert with normal vital signs.    On exam there is scattered rhonchi and faint expiratory wheeze, oropharynx is moderately erythematous and nasal mucosa is edematous and erythematous with congestion and postnasal drip.  As of her age and comorbidities will cover with antibiotics, do think she has a very mild acute bronchitis, will treat with steroids.  I did look at the patient's last EKG which showed a QTc > 500 so I will avoid Z-Pak (QTC prolongation) and give doxycycline, which will cover both sinuses and atypical lung infections.  She was encouraged to follow-up with her PCP, Dr. Dennard Schaumann in 2 to 3 weeks for recheck, and to return sooner if any acute worsening.  She currently did not endorse any chest tightness, wheeze and had no exertional shortness of breath and so to minimize more medications and side effects medications I did not prescribe her an inhaler which I believe would already be difficult for her to use.    ICD-10-CM   1. Acute non-recurrent pansinusitis J01.40   2. Acute bronchitis, unspecified organism J20.9      - doxycycline (VIBRA-TABS) 100 MG tablet; Take 1 tablet (100 mg total) by mouth 2 (two) times daily for 7 days. - predniSONE (DELTASONE) 20 MG tablet; 2 tabs poqday 1-3, 1 tabs poqday 4-6 - benzonatate (TESSALON) 100 MG capsule; Take 1 capsule (100 mg total) by mouth 3 (three) times daily as needed for cough.  Pt left the clinic in good condition with VSS, no resp distress.  Copy of hand written plan, dx and treatment was faxed to nursing home after pt left OV.  Delsa Grana, PA-C 10/14/18 11:30 AM

## 2018-12-11 ENCOUNTER — Other Ambulatory Visit: Payer: Self-pay | Admitting: Family Medicine

## 2018-12-13 ENCOUNTER — Telehealth: Payer: Self-pay | Admitting: Family Medicine

## 2018-12-13 MED ORDER — CEPHALEXIN 500 MG PO CAPS: 500.0000 mg | ORAL_CAPSULE | Freq: Three times a day (TID) | ORAL | 0 refills | Status: AC

## 2018-12-13 NOTE — Telephone Encounter (Signed)
Received lab report with UA on it per Dr. Dennard Schaumann showing UTI needs to be treated with Keflex 500 tid x 5 days. Called [facility] and they report that she is having some increase confusion with frequent nighttime awakenings for urination. She has even walked into another patients room to use their bathroom. Med sent to pharm and order faxed to [facility].

## 2018-12-20 ENCOUNTER — Ambulatory Visit: Payer: Medicare Other | Admitting: Family Medicine

## 2018-12-20 VITALS — BP 160/80 | HR 68 | Temp 98.2°F | Resp 16 | Ht 62.5 in | Wt 156.0 lb

## 2018-12-20 DIAGNOSIS — R300 Dysuria: Secondary | ICD-10-CM | POA: Diagnosis not present

## 2018-12-20 DIAGNOSIS — R358 Other polyuria: Secondary | ICD-10-CM

## 2018-12-20 DIAGNOSIS — R3589 Other polyuria: Secondary | ICD-10-CM

## 2018-12-20 LAB — URINALYSIS, ROUTINE W REFLEX MICROSCOPIC
BACTERIA UA: NONE SEEN /HPF
BILIRUBIN URINE: NEGATIVE
Glucose, UA: NEGATIVE
Hgb urine dipstick: NEGATIVE
Ketones, ur: NEGATIVE
NITRITE: NEGATIVE
PROTEIN: NEGATIVE
RBC / HPF: NONE SEEN /HPF (ref 0–2)
SPECIFIC GRAVITY, URINE: 1.02 (ref 1.001–1.03)
pH: 7 (ref 5.0–8.0)

## 2018-12-20 LAB — MICROSCOPIC MESSAGE

## 2018-12-20 NOTE — Progress Notes (Signed)
Subjective:    Patient ID: Hannah Galvan, female    DOB: [birth year], 85 y.o.   MRN: [id number]  HPI  Patient is here today with her son.  Recently the nursing home at which she stays contacted [place] and stated that she had foul-smelling urine.  A urinalysis was abnormal showing leukocyte Estrace, as well as nitrites.  Keflex 500 mg p.o. 3 times daily for 5 days was prescribed and the patient completed the antibiotics.  She is here today reporting increased urinary frequency.  When I talked to the patient, she denies any dysuria.  She states that she is having to go to the bathroom numerous times throughout the day.  She feels sudden constant urges to go to the bathroom.  She is also dreaming at night that she has to use the bathroom which will awaken her from sleep and cause her to have to go to the room.  She denies any dysuria, pelvic discomfort, low back pain, fevers chills or nausea.  Urinalysis here is negative for ketones, negative for blood, negative for nitrites does show a trace amount of leukocyte esterase however the symptoms have now been going on for more than a month according to the patient raising the suspicion for overactive bladder with contamination on her urinalysis.  She also complains of pain in her right great toenail.  The toenail is thick with numerous layers.  It is yellow and abnormal in appearance consistent with onychomycosis.  It is not ingrown.  There is no erythema or swelling or warmth or paronychia. Past Medical History:  Diagnosis Date  . Allergy   . Anxiety   . Arthritis   . Blood transfusion without reported diagnosis   . Cancer ([place])    breast, s/p RXT, bilateral  . Cataract   . History of echocardiogram    a. Echo 8/16: Vigorous LVF, EF 54-00%, grade 1 diastolic dysfunction, trivial AI, mild MR, mild RVE, normal RV function, PASP 33 mmHg // b. Echo 1/17 EF 65-70%, normal wall motion, grade 1 diastolic dysfunction, trivial AI, MAC, mild MR // c. Echo 11/26/16: mild  LVH, EF 60-65, mild AI, mild MR, mild LAE, mild to mod TR  . History of stress test    Lexiscan Myoview 8/16:  EF 81%, breast attenuation, no ischemia; Low Risk // Myoview 11/26/16: EF 75, apical defect c/w soft tissue attenuation, no ischemia or scar; Low Risk  . HOCM (hypertrophic obstructive cardiomyopathy) ([place])   . Hyperlipidemia   . Hypertension   . Hypothyroidism   . Shortness of breath   . Thyroid disease    Past Surgical History:  Procedure Laterality Date  . ABDOMINAL HYSTERECTOMY  2008  . bladder tact    . BREAST LUMPECTOMY Right 2000  . BREAST LUMPECTOMY Left 2007  . CATARACT EXTRACTION  2011  . JOINT REPLACEMENT    . KNEE SURGERY     right  . ROTATOR CUFF REPAIR     right  . THYROID SURGERY  2002  . TOTAL KNEE ARTHROPLASTY Left 06/02/2013   Dr Rhona Raider  . TOTAL KNEE ARTHROPLASTY Right 06/02/2013   Procedure: RIGHT TOTAL KNEE ARTHROPLASTY;  Surgeon: Hessie Dibble, MD;  Location: [facility];  Service: Orthopedics;  Laterality: Right;  . TOTAL KNEE ARTHROPLASTY Left 08/22/2014   Procedure: TOTAL KNEE ARTHROPLASTY;  Surgeon: Hessie Dibble, MD;  Location: [facility];  Service: Orthopedics;  Laterality: Left;   Current Outpatient Medications on File Prior to Visit  Medication Sig Dispense  Refill  . amLODipine (NORVASC) 5 MG tablet TAKE 1 TABLET BY MOUTH EVERY DAY 90 tablet 3  . aspirin 81 MG tablet Take 81 mg by mouth daily.    . Calcium Carbonate-Vit D-Min (GNP CALCIUM 1200) 1200-1000 MG-UNIT CHEW 1 tab PO QAM 30 tablet   . cholecalciferol (VITAMIN D) 1000 units tablet Take 1,000 Units by mouth daily.    . Cranberry 250 MG CAPS Take 250 mg by mouth daily.    Marland Kitchen donepezil (ARICEPT) 5 MG tablet TAKE 1 TABLET (5 MG TOTAL) BY MOUTH AT BEDTIME. 30 tablet 5  . exemestane (AROMASIN) 25 MG tablet TAKE 1 TAB BY MOUTH EVERY DAY 30 tablet 10  . folic acid (FOLVITE) 1 MG tablet TAKE 1 TABLET BY MOUTH EVERY DAY 90 tablet 3  . hydrochlorothiazide (HYDRODIURIL) 25 MG tablet Take 1 tablet (25  mg total) by mouth daily. 90 tablet 3  . KLOR-CON M20 20 MEQ tablet TAKE 1 TABLET BY MOUTH DAILY 90 tablet 3  . levothyroxine (SYNTHROID, LEVOTHROID) 125 MCG tablet TAKE 1 TABLET BY MOUTH ONCE A DAY 90 tablet 2  . LORazepam (ATIVAN) 1 MG tablet 1 tab po qhs 30 tablet 2  . losartan (COZAAR) 100 MG tablet TAKE 1 TABLET BY MOUTH EVERY DAY 90 tablet 0  . Multiple Vitamins-Minerals (CENTRUM SILVER ADULT 50+ PO) Take 1 tablet by mouth daily.     . nebivolol (BYSTOLIC) 5 MG tablet Take 1 tablet (5 mg total) by mouth daily. 90 tablet 3  . nitroGLYCERIN (NITROSTAT) 0.4 MG SL tablet Place 1 tablet (0.4 mg total) under the tongue every 5 (five) minutes as needed for chest pain. 25 tablet 3  . rosuvastatin (CRESTOR) 10 MG tablet TAKE 1 TABLET BY MOUTH EVERY DAY AT 6PM 90 tablet 3  . sertraline (ZOLOFT) 100 MG tablet Take 1 tablet (100 mg total) by mouth daily. (Patient taking differently: Take 100 mg by mouth daily. Pt is weaning down off this) 30 tablet 3  . triamcinolone cream (KENALOG) 0.1 % Apply 1 application topically 2 (two) times daily. 30 g 0   No current facility-administered medications on file prior to visit.    Allergies  Allergen Reactions  . Ambien [Zolpidem]     confusion  . Pyridium [Phenazopyridine Hcl] Other (See Comments)    hemolysis  . Sulfa Antibiotics Rash   Social History   Socioeconomic History  . Marital status: Widowed    Spouse name: Not on file  . Number of children: Not on file  . Years of education: Not on file  . Highest education level: Not on file  Occupational History  . Not on file  Social Needs  . Financial resource strain: Not on file  . Food insecurity:    Worry: Not on file    Inability: Not on file  . Transportation needs:    Medical: Not on file    Non-medical: Not on file  Tobacco Use  . Smoking status: Never Smoker  . Smokeless tobacco: Never Used  . Tobacco comment: never used tobacco  Substance and Sexual Activity  . Alcohol use: No     Alcohol/week: 0.0 standard drinks  . Drug use: No  . Sexual activity: Not Currently  Lifestyle  . Physical activity:    Days per week: Not on file    Minutes per session: Not on file  . Stress: Not on file  Relationships  . Social connections:    Talks on phone: Not on file  Gets together: Not on file    Attends religious service: Not on file    Active member of club or organization: Not on file    Attends meetings of clubs or organizations: Not on file    Relationship status: Not on file  . Intimate partner violence:    Fear of current or ex partner: Not on file    Emotionally abused: Not on file    Physically abused: Not on file    Forced sexual activity: Not on file  Other Topics Concern  . Not on file  Social History Narrative  . Not on file      Review of Systems  All other systems reviewed and are negative.      Objective:   Physical Exam Vitals signs reviewed.  Constitutional:      Appearance: Normal appearance.  Cardiovascular:     Rate and Rhythm: Normal rate.     Pulses: Normal pulses.     Heart sounds: Normal heart sounds.  Pulmonary:     Effort: Pulmonary effort is normal.     Breath sounds: Normal breath sounds.  Abdominal:     General: Bowel sounds are normal. There is no distension.     Palpations: Abdomen is soft.     Tenderness: There is no abdominal tenderness. There is no guarding or rebound.  Neurological:     Mental Status: She is alert.   See history of present illness for description of toenail        Assessment & Plan:  Dysuria - Plan: Urinalysis, Routine w reflex microscopic, Urine Culture  Polyuria - Plan: CBC with Differential/Platelet, COMPLETE METABOLIC PANEL WITH GFR, Hemoglobin A1c, Urine Culture  I do not believe the patient has a urinary tract infection.  I will send it for culture.  Meanwhile we will treat the patient empirically for overactive bladder with Toviaz 4 mg a day and reassess in 1 week to see if symptoms  have improved on the samples.  I would try to avoid oxybutynin given her underlying confusion and age.  I believe the pain in her toenail is likely due to onychomycosis.  We will treat the patient with Lamisil 250 mg p.o. daily for 3 months.  She will need liver function test every month while she is on the medication.  I discussed the risk of liver toxicity.  Given the polyuria, I will check a CMP to evaluate her renal function and also check a hemoglobin A1c.

## 2018-12-21 ENCOUNTER — Encounter: Payer: Self-pay | Admitting: *Deleted

## 2018-12-21 LAB — COMPLETE METABOLIC PANEL WITH GFR
AG Ratio: 2.4 (calc) (ref 1.0–2.5)
ALBUMIN MSPROF: 4.7 g/dL (ref 3.6–5.1)
ALKALINE PHOSPHATASE (APISO): 46 U/L (ref 33–130)
ALT: 24 U/L (ref 6–29)
AST: 30 U/L (ref 10–35)
BUN/Creatinine Ratio: 21 (calc) (ref 6–22)
BUN: 20 mg/dL (ref 7–25)
CHLORIDE: 106 mmol/L (ref 98–110)
CO2: 29 mmol/L (ref 20–32)
CREATININE: 0.97 mg/dL — AB (ref 0.60–0.88)
Calcium: 10.2 mg/dL (ref 8.6–10.4)
GFR, Est African American: 62 mL/min/{1.73_m2} (ref >=60)
GFR, Est Non African American: 53 mL/min/{1.73_m2} — ABNORMAL LOW (ref >=60)
Globulin: 2 g/dL (calc) (ref 1.9–3.7)
Glucose, Bld: 96 mg/dL (ref 65–99)
Potassium: 4.2 mmol/L (ref 3.5–5.3)
Sodium: 141 mmol/L (ref 135–146)
Total Bilirubin: 0.4 mg/dL (ref 0.2–1.2)
Total Protein: 6.7 g/dL (ref 6.1–8.1)

## 2018-12-21 LAB — CBC WITH DIFFERENTIAL/PLATELET
Absolute Monocytes: 722 cells/uL (ref 200–950)
BASOS ABS: 61 {cells}/uL (ref 0–200)
Basophils Relative: 0.8 %
EOS PCT: 3 %
Eosinophils Absolute: 228 cells/uL (ref 15–500)
HCT: 41.4 % (ref 35.0–45.0)
HEMOGLOBIN: 14.4 g/dL (ref 11.7–15.5)
LYMPHS ABS: 2698 {cells}/uL (ref 850–3900)
MCH: 33 pg (ref 27.0–33.0)
MCHC: 34.8 g/dL (ref 32.0–36.0)
MCV: 95 fL (ref 80.0–100.0)
MONOS PCT: 9.5 %
MPV: 11.1 fL (ref 7.5–12.5)
NEUTROS ABS: 3891 {cells}/uL (ref 1500–7800)
Neutrophils Relative %: 51.2 %
PLATELETS: 186 10*3/uL (ref 140–400)
RBC: 4.36 10*6/uL (ref 3.80–5.10)
RDW: 12.4 % (ref 11.0–15.0)
Total Lymphocyte: 35.5 %
WBC: 7.6 10*3/uL (ref 3.8–10.8)

## 2018-12-21 LAB — HEMOGLOBIN A1C
EAG (MMOL/L): 6.2 (calc)
HEMOGLOBIN A1C: 5.5 %{Hb} (ref <5.7)
Mean Plasma Glucose: 111 (calc)

## 2018-12-21 LAB — URINE CULTURE
MICRO NUMBER: 109026
Result:: NO GROWTH
SPECIMEN QUALITY: ADEQUATE

## 2018-12-22 ENCOUNTER — Other Ambulatory Visit: Payer: Self-pay | Admitting: Family Medicine

## 2018-12-23 ENCOUNTER — Encounter: Payer: Self-pay | Admitting: Family Medicine

## 2018-12-27 ENCOUNTER — Other Ambulatory Visit: Payer: Self-pay | Admitting: Family Medicine

## 2018-12-29 ENCOUNTER — Telehealth: Payer: Self-pay | Admitting: Family Medicine

## 2018-12-29 NOTE — Telephone Encounter (Signed)
Pt's son called and states that the medication for OAB is not helping her at all and it is making her have really bad dry mouth. She has continued to urinate at night.

## 2018-12-30 ENCOUNTER — Other Ambulatory Visit: Payer: Self-pay | Admitting: *Deleted

## 2018-12-30 MED ORDER — LEVOTHYROXINE SODIUM 125 MCG PO TABS: ORAL_TABLET | ORAL | 10 refills | Status: DC

## 2018-12-30 NOTE — Telephone Encounter (Signed)
Stop toviaz.  I would not add more medication but if problematic, could try myrbetriq.

## 2019-01-01 ENCOUNTER — Other Ambulatory Visit: Payer: Self-pay | Admitting: Family Medicine

## 2019-01-05 NOTE — Telephone Encounter (Signed)
LMOVM with son as to the plan.

## 2019-01-07 ENCOUNTER — Other Ambulatory Visit: Payer: Self-pay | Admitting: Family Medicine

## 2019-01-07 ENCOUNTER — Telehealth: Payer: Self-pay | Admitting: Family Medicine

## 2019-01-07 MED ORDER — NEBIVOLOL HCL 5 MG PO TABS: ORAL_TABLET | ORAL | 3 refills | Status: DC

## 2019-01-07 NOTE — Telephone Encounter (Signed)
Orders faxed to [facility] for UA/UC and Keflex 500mg  tid x 5 days. UC&UC to be done before starting antibx

## 2019-01-07 NOTE — Telephone Encounter (Signed)
Hannah Galvan from [facility] calling get a call back from you regarding a urine sample and about her cold that she has \ [phone]

## 2019-01-08 ENCOUNTER — Encounter (HOSPITAL_COMMUNITY): Payer: Self-pay | Admitting: *Deleted

## 2019-01-08 ENCOUNTER — Inpatient Hospital Stay (HOSPITAL_COMMUNITY)
Admission: EM | Admit: 2019-01-08 | Discharge: 2019-01-11 | DRG: 871 | Disposition: A | Discharge status: Nursing Home (Includes ICF) | Payer: Medicare Other | Attending: Internal Medicine | Admitting: Internal Medicine

## 2019-01-08 DIAGNOSIS — C50919 Malignant neoplasm of unspecified site of unspecified female breast: Secondary | ICD-10-CM | POA: Diagnosis present

## 2019-01-08 DIAGNOSIS — R0602 Shortness of breath: Secondary | ICD-10-CM | POA: Diagnosis not present

## 2019-01-08 DIAGNOSIS — I10 Essential (primary) hypertension: Secondary | ICD-10-CM | POA: Diagnosis not present

## 2019-01-08 DIAGNOSIS — R5381 Other malaise: Secondary | ICD-10-CM | POA: Diagnosis present

## 2019-01-08 DIAGNOSIS — Z923 Personal history of irradiation: Secondary | ICD-10-CM

## 2019-01-08 DIAGNOSIS — Z8249 Family history of ischemic heart disease and other diseases of the circulatory system: Secondary | ICD-10-CM

## 2019-01-08 DIAGNOSIS — Z90710 Acquired absence of both cervix and uterus: Secondary | ICD-10-CM

## 2019-01-08 DIAGNOSIS — J40 Bronchitis, not specified as acute or chronic: Secondary | ICD-10-CM | POA: Diagnosis present

## 2019-01-08 DIAGNOSIS — Z853 Personal history of malignant neoplasm of breast: Secondary | ICD-10-CM

## 2019-01-08 DIAGNOSIS — A419 Sepsis, unspecified organism: Secondary | ICD-10-CM | POA: Diagnosis not present

## 2019-01-08 DIAGNOSIS — Z87440 Personal history of urinary (tract) infections: Secondary | ICD-10-CM

## 2019-01-08 DIAGNOSIS — Z888 Allergy status to other drugs, medicaments and biological substances status: Secondary | ICD-10-CM

## 2019-01-08 DIAGNOSIS — G9341 Metabolic encephalopathy: Secondary | ICD-10-CM | POA: Diagnosis not present

## 2019-01-08 DIAGNOSIS — R05 Cough: Secondary | ICD-10-CM

## 2019-01-08 DIAGNOSIS — M1990 Unspecified osteoarthritis, unspecified site: Secondary | ICD-10-CM | POA: Diagnosis present

## 2019-01-08 DIAGNOSIS — Z79890 Hormone replacement therapy (postmenopausal): Secondary | ICD-10-CM

## 2019-01-08 DIAGNOSIS — R410 Disorientation, unspecified: Secondary | ICD-10-CM

## 2019-01-08 DIAGNOSIS — B974 Respiratory syncytial virus as the cause of diseases classified elsewhere: Secondary | ICD-10-CM | POA: Diagnosis present

## 2019-01-08 DIAGNOSIS — Z96653 Presence of artificial knee joint, bilateral: Secondary | ICD-10-CM | POA: Diagnosis present

## 2019-01-08 DIAGNOSIS — F0280 Dementia in other diseases classified elsewhere without behavioral disturbance: Secondary | ICD-10-CM | POA: Diagnosis present

## 2019-01-08 DIAGNOSIS — Z882 Allergy status to sulfonamides status: Secondary | ICD-10-CM

## 2019-01-08 DIAGNOSIS — J9601 Acute respiratory failure with hypoxia: Secondary | ICD-10-CM | POA: Diagnosis present

## 2019-01-08 DIAGNOSIS — J189 Pneumonia, unspecified organism: Secondary | ICD-10-CM

## 2019-01-08 DIAGNOSIS — I421 Obstructive hypertrophic cardiomyopathy: Secondary | ICD-10-CM | POA: Diagnosis present

## 2019-01-08 DIAGNOSIS — E039 Hypothyroidism, unspecified: Secondary | ICD-10-CM | POA: Diagnosis present

## 2019-01-08 DIAGNOSIS — Z66 Do not resuscitate: Secondary | ICD-10-CM | POA: Diagnosis present

## 2019-01-08 DIAGNOSIS — E785 Hyperlipidemia, unspecified: Secondary | ICD-10-CM | POA: Diagnosis present

## 2019-01-08 DIAGNOSIS — R059 Cough, unspecified: Secondary | ICD-10-CM

## 2019-01-08 DIAGNOSIS — G309 Alzheimer's disease, unspecified: Secondary | ICD-10-CM

## 2019-01-08 DIAGNOSIS — F419 Anxiety disorder, unspecified: Secondary | ICD-10-CM | POA: Diagnosis present

## 2019-01-08 DIAGNOSIS — E7800 Pure hypercholesterolemia, unspecified: Secondary | ICD-10-CM

## 2019-01-08 DIAGNOSIS — Z7982 Long term (current) use of aspirin: Secondary | ICD-10-CM

## 2019-01-08 DIAGNOSIS — Z79899 Other long term (current) drug therapy: Secondary | ICD-10-CM

## 2019-01-08 DIAGNOSIS — E079 Disorder of thyroid, unspecified: Secondary | ICD-10-CM | POA: Diagnosis present

## 2019-01-08 LAB — COMPREHENSIVE METABOLIC PANEL
ALT: 35 U/L (ref 0–44)
AST: 46 U/L — AB (ref 15–41)
Albumin: 4.1 g/dL (ref 3.5–5.0)
Alkaline Phosphatase: 47 U/L (ref 38–126)
Anion gap: 10 (ref 5–15)
BUN: 17 mg/dL (ref 8–23)
CO2: 23 mmol/L (ref 22–32)
Calcium: 9.3 mg/dL (ref 8.9–10.3)
Chloride: 105 mmol/L (ref 98–111)
Creatinine, Ser: 0.95 mg/dL (ref 0.44–1.00)
GFR calc Af Amer: >60 mL/min (ref >60)
GFR calc non Af Amer: 55 mL/min — ABNORMAL LOW (ref >60)
Glucose, Bld: 127 mg/dL — ABNORMAL HIGH (ref 70–99)
Potassium: 4 mmol/L (ref 3.5–5.1)
Sodium: 138 mmol/L (ref 135–145)
Total Bilirubin: 0.9 mg/dL (ref 0.3–1.2)
Total Protein: 7.6 g/dL (ref 6.5–8.1)

## 2019-01-08 LAB — URINALYSIS, ROUTINE W REFLEX MICROSCOPIC
Bilirubin Urine: NEGATIVE
Glucose, UA: 50 mg/dL — AB
Hgb urine dipstick: NEGATIVE
Ketones, ur: 20 mg/dL — AB
Leukocytes,Ua: NEGATIVE
Nitrite: NEGATIVE
PROTEIN: 100 mg/dL — AB
Specific Gravity, Urine: 1.024 (ref 1.005–1.030)
pH: 7 (ref 5.0–8.0)

## 2019-01-08 LAB — CBC WITH DIFFERENTIAL/PLATELET
Abs Immature Granulocytes: 0.07 10*3/uL (ref 0.00–0.07)
Basophils Absolute: 0.1 10*3/uL (ref 0.0–0.1)
Basophils Relative: 0 %
Eosinophils Absolute: 0 10*3/uL (ref 0.0–0.5)
Eosinophils Relative: 0 %
HCT: 43.5 % (ref 36.0–46.0)
Hemoglobin: 13.7 g/dL (ref 12.0–15.0)
Immature Granulocytes: 1 %
LYMPHS ABS: 1.4 10*3/uL (ref 0.7–4.0)
Lymphocytes Relative: 10 %
MCH: 30.5 pg (ref 26.0–34.0)
MCHC: 31.5 g/dL (ref 30.0–36.0)
MCV: 96.9 fL (ref 80.0–100.0)
MONO ABS: 1.1 10*3/uL — AB (ref 0.1–1.0)
Monocytes Relative: 8 %
Neutro Abs: 10.7 10*3/uL — ABNORMAL HIGH (ref 1.7–7.7)
Neutrophils Relative %: 81 %
Platelets: 145 10*3/uL — ABNORMAL LOW (ref 150–400)
RBC: 4.49 MIL/uL (ref 3.87–5.11)
RDW: 12.9 % (ref 11.5–15.5)
WBC: 13.3 10*3/uL — ABNORMAL HIGH (ref 4.0–10.5)
nRBC: 0 % (ref 0.0–0.2)

## 2019-01-08 LAB — LACTIC ACID, PLASMA: Lactic Acid, Venous: 1.9 mmol/L (ref 0.5–1.9)

## 2019-01-08 LAB — PROCALCITONIN: Procalcitonin: 0.12 ng/mL

## 2019-01-08 LAB — GROUP A STREP BY PCR: Group A Strep by PCR: NOT DETECTED

## 2019-01-08 MED ORDER — NITROGLYCERIN 0.4 MG SL SUBL: 0.4000 mg | SUBLINGUAL_TABLET | SUBLINGUAL | Status: DC | PRN

## 2019-01-08 MED ORDER — IPRATROPIUM-ALBUTEROL 0.5-2.5 (3) MG/3ML IN SOLN: 3.0000 mL | RESPIRATORY_TRACT | Status: DC | PRN

## 2019-01-08 MED ORDER — ROSUVASTATIN CALCIUM 5 MG PO TABS
10.0000 mg | ORAL_TABLET | Freq: Every day | ORAL | Status: DC
  Administered 2019-01-09 – 2019-01-11 (×3): 10 mg via ORAL
  Filled 2019-01-08 (×3): qty 2

## 2019-01-08 MED ORDER — SODIUM CHLORIDE 0.9 % IV SOLN
2.0000 g | INTRAVENOUS | Status: DC
  Administered 2019-01-08: 2 g via INTRAVENOUS
  Filled 2019-01-08: qty 20

## 2019-01-08 MED ORDER — IPRATROPIUM BROMIDE 0.02 % IN SOLN
0.5000 mg | Freq: Once | RESPIRATORY_TRACT | Status: AC
  Administered 2019-01-08: 0.5 mg via RESPIRATORY_TRACT
  Filled 2019-01-08: qty 2.5

## 2019-01-08 MED ORDER — SODIUM CHLORIDE 0.9 % IV SOLN
500.0000 mg | INTRAVENOUS | Status: DC
  Administered 2019-01-08: 500 mg via INTRAVENOUS
  Filled 2019-01-08: qty 500

## 2019-01-08 MED ORDER — GUAIFENESIN 200 MG PO TABS
200.0000 mg | ORAL_TABLET | ORAL | Status: DC | PRN
  Administered 2019-01-09: 200 mg via ORAL
  Filled 2019-01-08 (×2): qty 1

## 2019-01-08 MED ORDER — ALBUTEROL SULFATE (2.5 MG/3ML) 0.083% IN NEBU
5.0000 mg | INHALATION_SOLUTION | Freq: Once | RESPIRATORY_TRACT | Status: AC
  Administered 2019-01-08: 5 mg via RESPIRATORY_TRACT
  Filled 2019-01-08: qty 6

## 2019-01-08 MED ORDER — EXEMESTANE 25 MG PO TABS
25.0000 mg | ORAL_TABLET | Freq: Every day | ORAL | Status: DC
  Administered 2019-01-09 – 2019-01-11 (×3): 25 mg via ORAL
  Filled 2019-01-08 (×3): qty 1

## 2019-01-08 MED ORDER — POTASSIUM CHLORIDE CRYS ER 20 MEQ PO TBCR
20.0000 meq | EXTENDED_RELEASE_TABLET | Freq: Every day | ORAL | Status: DC
  Administered 2019-01-09 – 2019-01-10 (×2): 20 meq via ORAL
  Filled 2019-01-08 (×2): qty 1

## 2019-01-08 MED ORDER — SERTRALINE HCL 100 MG PO TABS: 100.0000 mg | ORAL_TABLET | Freq: Every day | ORAL | Status: DC

## 2019-01-08 MED ORDER — ACETAMINOPHEN 325 MG PO TABS
650.0000 mg | ORAL_TABLET | Freq: Four times a day (QID) | ORAL | Status: DC | PRN
  Administered 2019-01-09: 650 mg via ORAL
  Filled 2019-01-08 (×2): qty 2

## 2019-01-08 MED ORDER — SODIUM CHLORIDE 0.9 % IV BOLUS (SEPSIS)
1000.0000 mL | Freq: Once | INTRAVENOUS | Status: AC
  Administered 2019-01-08: 1000 mL via INTRAVENOUS

## 2019-01-08 MED ORDER — ACETAMINOPHEN 325 MG PO TABS
650.0000 mg | ORAL_TABLET | Freq: Once | ORAL | Status: AC | PRN
  Administered 2019-01-08: 650 mg via ORAL
  Filled 2019-01-08: qty 2

## 2019-01-08 MED ORDER — NEBIVOLOL HCL 5 MG PO TABS
5.0000 mg | ORAL_TABLET | Freq: Every day | ORAL | Status: DC
  Administered 2019-01-09 – 2019-01-11 (×3): 5 mg via ORAL
  Filled 2019-01-08 (×3): qty 1

## 2019-01-08 MED ORDER — SODIUM CHLORIDE 0.9% FLUSH: 3.0000 mL | Freq: Once | INTRAVENOUS | Status: DC

## 2019-01-08 MED ORDER — DONEPEZIL HCL 5 MG PO TABS: 5.0000 mg | ORAL_TABLET | Freq: Every day | ORAL | Status: DC

## 2019-01-08 MED ORDER — HYDROCHLOROTHIAZIDE 25 MG PO TABS: 25.0000 mg | ORAL_TABLET | Freq: Every day | ORAL | Status: DC

## 2019-01-08 MED ORDER — SODIUM CHLORIDE 0.9 % IV SOLN
500.0000 mg | INTRAVENOUS | Status: DC
  Administered 2019-01-09: 500 mg via INTRAVENOUS
  Filled 2019-01-08 (×2): qty 500

## 2019-01-08 MED ORDER — LOSARTAN POTASSIUM 50 MG PO TABS
100.0000 mg | ORAL_TABLET | Freq: Every day | ORAL | Status: DC
  Administered 2019-01-09 – 2019-01-11 (×3): 100 mg via ORAL
  Filled 2019-01-08 (×3): qty 2

## 2019-01-08 MED ORDER — ASPIRIN EC 81 MG PO TBEC
81.0000 mg | DELAYED_RELEASE_TABLET | Freq: Every day | ORAL | Status: DC
  Administered 2019-01-09 – 2019-01-11 (×3): 81 mg via ORAL
  Filled 2019-01-08 (×3): qty 1

## 2019-01-08 MED ORDER — LEVOTHYROXINE SODIUM 25 MCG PO TABS
125.0000 ug | ORAL_TABLET | Freq: Every day | ORAL | Status: DC
  Administered 2019-01-09 – 2019-01-11 (×3): 125 ug via ORAL
  Filled 2019-01-08 (×3): qty 1

## 2019-01-08 MED ORDER — ENOXAPARIN SODIUM 40 MG/0.4ML ~~LOC~~ SOLN
40.0000 mg | SUBCUTANEOUS | Status: DC
  Administered 2019-01-08 – 2019-01-10 (×3): 40 mg via SUBCUTANEOUS
  Filled 2019-01-08 (×3): qty 0.4

## 2019-01-08 MED ORDER — IPRATROPIUM-ALBUTEROL 0.5-2.5 (3) MG/3ML IN SOLN: 3.0000 mL | Freq: Four times a day (QID) | RESPIRATORY_TRACT | Status: DC

## 2019-01-08 MED ORDER — SODIUM CHLORIDE 0.9 % IV SOLN: 1.0000 g | INTRAVENOUS | Status: DC

## 2019-01-08 MED ORDER — LORAZEPAM 1 MG PO TABS
1.0000 mg | ORAL_TABLET | Freq: Every day | ORAL | Status: DC
  Administered 2019-01-08 – 2019-01-10 (×3): 1 mg via ORAL
  Filled 2019-01-08 (×3): qty 1

## 2019-01-08 MED ORDER — FOLIC ACID 1 MG PO TABS
1.0000 mg | ORAL_TABLET | Freq: Every day | ORAL | Status: DC
  Administered 2019-01-09 – 2019-01-11 (×3): 1 mg via ORAL
  Filled 2019-01-08 (×3): qty 1

## 2019-01-08 MED ORDER — SODIUM CHLORIDE 0.9 % IV BOLUS (SEPSIS)
250.0000 mL | Freq: Once | INTRAVENOUS | Status: AC
  Administered 2019-01-08: 250 mL via INTRAVENOUS

## 2019-01-08 NOTE — H&P (Signed)
History and Physical    Hannah Galvan OAC:[id number] DOB: 11/15/1931 DOA: 01/08/2019  PCP: Susy Frizzle, MD   Patient coming from: ALF  Chief Complaint: Shortness of breath  HPI: Hannah Galvan is a 85 y.o. female with medical history significant hypothyroidism, hypertension, hyperlipidemia mild dementia, HOCM, breast cancer history of postradiation and lumpectomy living in assisted living facility brought by the daughter-in-law for evaluation of cough and confusion for for 5 days. Patient had UTI about 2 weeks ago and had altered mental status which improved but for past for 5 days she has been having confusion, complaining of cough with yellow sputum production fever, generalized aches.  Patient denies any neck stiffness, nausea, vomiting, abdominal pain double vision, focal weakness, hematemesis/hematochezia. Patient was seen at urgent care center today with reportedly she had negative chest x-ray, negative flu screen was diagnosed as pneumonia and empirically planned to start on Augmentin, however then they instead told her to go to the ER for further management. ED Course: Febrile 103.5 blood pressure stable, lactic acid 1.9, has leukocytosis.  Patient was given IV fluids, septic admission was ordered.  Hospitalist was consulted for admission.    Review of Systems:  Positive for fever  Positive for cough  Positive for generalized weakness  Negative for nausea and vomiting  All systems were reviewed and negative except as mentioned in HPI above.   Past Medical History:  Diagnosis Date  . Allergy   . Anxiety   . Arthritis   . Blood transfusion without reported diagnosis   . Cancer ([place])    breast, s/p RXT, bilateral  . Cataract   . History of echocardiogram    a. Echo 8/16: Vigorous LVF, EF 01-09%, grade 1 diastolic dysfunction, trivial AI, mild MR, mild RVE, normal RV function, PASP 33 mmHg // b. Echo 1/17 EF 65-70%, normal wall motion, grade 1 diastolic dysfunction, trivial  AI, MAC, mild MR // c. Echo 11/26/16: mild LVH, EF 60-65, mild AI, mild MR, mild LAE, mild to mod TR  . History of stress test    Lexiscan Myoview 8/16:  EF 81%, breast attenuation, no ischemia; Low Risk // Myoview 11/26/16: EF 75, apical defect c/w soft tissue attenuation, no ischemia or scar; Low Risk  . HOCM (hypertrophic obstructive cardiomyopathy) ([place])   . Hyperlipidemia   . Hypertension   . Hypothyroidism   . Shortness of breath   . Thyroid disease     Past Surgical History:  Procedure Laterality Date  . ABDOMINAL HYSTERECTOMY  2008  . bladder tact    . BREAST LUMPECTOMY Right 2000  . BREAST LUMPECTOMY Left 2007  . CATARACT EXTRACTION  2011  . JOINT REPLACEMENT    . KNEE SURGERY     right  . ROTATOR CUFF REPAIR     right  . THYROID SURGERY  2002  . TOTAL KNEE ARTHROPLASTY Left 06/02/2013   Dr Rhona Raider  . TOTAL KNEE ARTHROPLASTY Right 06/02/2013   Procedure: RIGHT TOTAL KNEE ARTHROPLASTY;  Surgeon: Hessie Dibble, MD;  Location: [facility];  Service: Orthopedics;  Laterality: Right;  . TOTAL KNEE ARTHROPLASTY Left 08/22/2014   Procedure: TOTAL KNEE ARTHROPLASTY;  Surgeon: Hessie Dibble, MD;  Location: [facility];  Service: Orthopedics;  Laterality: Left;     reports that she has never smoked. She has never used smokeless tobacco. She reports that she does not drink alcohol or use drugs.  Allergies  Allergen Reactions  . Ambien [Zolpidem]     confusion  .  Pyridium [Phenazopyridine Hcl] Other (See Comments)    hemolysis  . Sulfa Antibiotics Rash    Family History  Problem Relation Age of Onset  . Hypertension Mother   . Stroke Mother   . Cancer Father   . Cancer Sister        leukemia  . Cancer Brother        leukemia  . Cancer Sister        vaginal  . Cancer Sister        bladder  . Cancer Brother        prostate, skin  . Cancer Brother        colon  . Cancer Brother        prostate, bone mets  . Cancer Brother        skin  . Heart disease Sister       Prior to Admission medications   Medication Sig Start Date End Date Taking? Authorizing Provider  acetaminophen (TYLENOL) 325 MG tablet TAKE 1 TAB BY MOUTH EVERY 6 HOURS AS NEEDED FOR MILD PAIN Patient taking differently: Take 325 mg by mouth every 6 (six) hours as needed for mild pain.  12/22/18  Yes Susy Frizzle, MD  aspirin 81 MG tablet Take 81 mg by mouth daily.   Yes [provider]  Calcium Carbonate-Vit D-Min (GNP CALCIUM 1200) 1200-1000 MG-UNIT CHEW 1 tab PO QAM Patient taking differently: Chew 1 tablet by mouth.  08/05/17  Yes Susy Frizzle, MD  cholecalciferol (VITAMIN D) 1000 units tablet Take 1,000 Units by mouth daily.   Yes [provider]  Cranberry 500 MG TABS Take 500 mg by mouth daily.    Yes [provider]  exemestane (AROMASIN) 25 MG tablet TAKE 1 TAB BY MOUTH EVERY DAY Patient taking differently: Take 25 mg by mouth daily after breakfast.  12/13/18  Yes Pickard, Cammie Mcgee, MD  folic acid (FOLVITE) 1 MG tablet TAKE 1 TAB BY MOUTH EVERY DAY Patient taking differently: Take 1 mg by mouth daily.  12/22/18  Yes Pickard, Cammie Mcgee, MD  KLOR-CON M20 20 MEQ tablet TAKE 1 TABLET BY MOUTH DAILY Patient taking differently: Take 20 mEq by mouth once.  11/10/16  Yes Susy Frizzle, MD  levothyroxine (SYNTHROID, LEVOTHROID) 125 MCG tablet TAKE 1 TAB BY MOUTH EVERY DAY Patient taking differently: Take 125 mcg by mouth daily before breakfast. TAKE 1 TAB BY MOUTH EVERY DAY 12/30/18  Yes Susy Frizzle, MD  LORazepam (ATIVAN) 1 MG tablet 1 tab po qhs Patient taking differently: Take 1 mg by mouth at bedtime.  09/16/18  Yes Susy Frizzle, MD  losartan (COZAAR) 100 MG tablet TAKE 1 TAB BY MOUTH EVERY DAY FOR HTN Patient taking differently: Take 100 mg by mouth daily.  01/03/19  Yes Susy Frizzle, MD  Multiple Vitamins-Minerals (CENTRUM SILVER ADULT 50+ PO) Take 1 tablet by mouth daily.    Yes [provider]  nebivolol (BYSTOLIC) 5 MG  tablet TAKE 1 TAB BY MOUTH EVERY DAY FOR HTN Patient taking differently: Take 5 mg by mouth daily.  01/07/19  Yes Susy Frizzle, MD  nitroGLYCERIN (NITROSTAT) 0.4 MG SL tablet Place 1 tablet (0.4 mg total) under the tongue every 5 (five) minutes as needed for chest pain. 11/29/15  Yes Weaver, Scott T, PA-C  rosuvastatin (CRESTOR) 10 MG tablet TAKE 1 TABLET BY MOUTH EVERY DAY AT 6PM Patient taking differently: Take 10 mg by mouth daily.  07/16/17  Yes Pickard,  Cammie Mcgee, MD  terbinafine (LAMISIL) 250 MG tablet Take 250 mg by mouth daily. 12/21/18  Yes [provider]  amLODipine (NORVASC) 5 MG tablet TAKE 1 TABLET BY MOUTH EVERY DAY 05/03/18   Susy Frizzle, MD  donepezil (ARICEPT) 5 MG tablet TAKE 1 TABLET (5 MG TOTAL) BY MOUTH AT BEDTIME. 06/12/17   Susy Frizzle, MD  hydrochlorothiazide (HYDRODIURIL) 25 MG tablet Take 1 tablet (25 mg total) by mouth daily. 07/21/17   Susy Frizzle, MD  sertraline (ZOLOFT) 100 MG tablet Take 1 tablet (100 mg total) by mouth daily. 02/16/18   Susy Frizzle, MD  triamcinolone cream (KENALOG) 0.1 % Apply 1 application topically 2 (two) times daily. 02/16/18   Susy Frizzle, MD    Physical Exam: Vitals:   01/08/19 1319  BP: (!) 151/70  Pulse: 89  Resp: 20  Temp: (!) 103.1 F (39.5 C)  TempSrc: Oral  SpO2: 96%    Constitutional: NAD, calm, comfortable Vitals:   01/08/19 1319  BP: (!) 151/70  Pulse: 89  Resp: 20  Temp: (!) 103.1 F (39.5 C)  TempSrc: Oral  SpO2: 96%    General : Alert awake, chronically sick looking.   Eyes: PERRL, lids and conjunctivae normal ENMT: Mucous membranes are moist. Posterior pharynx clear of any exudate or lesions. Normal dentition.  Neck: Normal, supple, no masses, no thyromegaly Respiratory: Bilaterally diffuse rhonchi present.Normal respiratory effort.No accessory muscle use.  Cardiovascular: Regular rate and rhythm, no murmurs / rubs / gallops.2+ pedal pulses. No carotid bruits.  Abdomen:  Soft, nontender, no masses palpated, bowel sounds present Musculoskeletal: no clubbing/cyanosis. No joint deformity upper and lower extremities. Good ROM, no contractures. Normal muscle tone.  Skin: No rashes, lesions, ulcers.No induration Neurologic: CN 2-12 grossly intact. Sensation intact, able to move upper and lower extremities with no focal weakness, alert, awake, oriented to self, place, people, current year but not to month. Psychiatric: Normal judgment and insight. Alert and oriented x 3. Normal mood.   Foley Catheter:  Labs on Admission: I have personally reviewed following labs and imaging studies  CBC: Recent Labs  Lab 01/08/19 1338  WBC 13.3*  NEUTROABS 10.7*  HGB 13.7  HCT 43.5  MCV 96.9  PLT 527*   Basic Metabolic Panel: Recent Labs  Lab 01/08/19 1338  NA 138  K 4.0  CL 105  CO2 23  GLUCOSE 127*  BUN 17  CREATININE 0.95  CALCIUM 9.3   GFR: CrCl cannot be calculated (Unknown ideal weight.). Liver Function Tests: Recent Labs  Lab 01/08/19 1338  AST 46*  ALT 35  ALKPHOS 47  BILITOT 0.9  PROT 7.6  ALBUMIN 4.1   No results for input(s): LIPASE, AMYLASE in the last 168 hours. No results for input(s): AMMONIA in the last 168 hours. Coagulation Profile: No results for input(s): INR, PROTIME in the last 168 hours. Cardiac Enzymes: No results for input(s): CKTOTAL, CKMB, CKMBINDEX, TROPONINI in the last 168 hours. BNP (last 3 results) No results for input(s): PROBNP in the last 8760 hours. HbA1C: No results for input(s): HGBA1C in the last 72 hours. CBG: No results for input(s): GLUCAP in the last 168 hours. Lipid Profile: No results for input(s): CHOL, HDL, LDLCALC, TRIG, CHOLHDL, LDLDIRECT in the last 72 hours. Thyroid Function Tests: No results for input(s): TSH, T4TOTAL, FREET4, T3FREE, THYROIDAB in the last 72 hours. Anemia Panel: No results for input(s): VITAMINB12, FOLATE, FERRITIN, TIBC, IRON, RETICCTPCT in the last 72 hours. Urine  analysis:  Component Value Date/Time   COLORURINE YELLOW 01/08/2019 1400   APPEARANCEUR CLOUDY (A) 01/08/2019 1400   LABSPEC 1.024 01/08/2019 1400   PHURINE 7.0 01/08/2019 1400   GLUCOSEU 50 (A) 01/08/2019 1400   HGBUR NEGATIVE 01/08/2019 1400   BILIRUBINUR NEGATIVE 01/08/2019 1400   BILIRUBINUR neg 08/31/2012 1142   KETONESUR 20 (A) 01/08/2019 1400   PROTEINUR 100 (A) 01/08/2019 1400   UROBILINOGEN 0.2 10/02/2015 2102   NITRITE NEGATIVE 01/08/2019 1400   LEUKOCYTESUR NEGATIVE 01/08/2019 1400    Radiological Exams on Admission: No results found.   Assessment/Plan  Sepsis POA: with leukocytosis/fever, presumed to be from pneumonia.  Although x-ray was unremarkable at urgent care today.  Patient has a lot of respiratory symptoms with cough bilateral extensive wheezing.  Influenza screen was negative, I will check for respiratory virus panel.  Continue on empiric antibiotics ceftriaxone azithromycin.  Follow-up blood culture, sputum culture, procalcitonin.  Repeat chest x-ray in the morning after hydration.  Mild confusion/suspect mild acute metabolic encephalopathy due to above.  Patient is alert awake oriented to place,year,day, person, got mixed up on the month/current president.  Does have mild Alzheimer's dementia at baseline but per family this is new for her as she has been "talking out of her head". she is non focal on exam. Will obtain CT head.  Hyperlipidemia: Continue her statin Breast cancer history, hx of lumpectomy/radiation no issues currently. Hypothyroidism: Continue her Synthroid Essential hypertension: Blood pressure stable resume home HCTZ along with potassium chloride, losartan Mild Alzheimer's dementia without behavioral disturbance: Continue supportive care.  Generalized weakness/debility obtain PT OT evaluation.  Severity of Illness: The appropriate patient status for this patient is OBSERVATION. Observation status is judged to be reasonable and necessary  in order to provide the required intensity of service to ensure the patient's safety. The patient's presenting symptoms, physical exam findings, and initial radiographic and laboratory data in the context of their medical condition is felt to place them at decreased risk for further clinical deterioration. Furthermore, it is anticipated that the patient will be medically stable for discharge from the hospital within 2 midnights of admission. The following factors support the patient status of observation.   " The patient's presenting symptoms include shortness of breath cough and fever. " The physical exam findings include bilateral wheezing, mild confusion. " The initial radiographic and laboratory data are leukocytosis.     DVT prophylaxis: Lovenox Code Status: Full code Family Communication: Admission, patients condition and plan of care including tests being ordered have been discussed with the patient and daughter-in-law who indicate understanding and agree with the plan and Code Status.  Consults called:  None  Antonieta Pert MD [facility] Call MD frm 7am-77 pm, through [email or web address] for 7PM-7AM, please contact night-coverage  01/08/2019, 4:33 PM

## 2019-01-08 NOTE — Progress Notes (Signed)
LOETA HERST [id number] Admission Data: 01/08/2019 5:56 PM Attending Provider: Antonieta Pert, MD  YHC:[id number], Cammie Mcgee, MD  ZOEYA GRAMAJO is a 85 y.o. female patient admitted from ED awake, alert  & orientated  X 3,  Full Code, VSS - Blood pressure (!) 168/88, pulse (!) 110, temperature 97.9 F (36.6 C), temperature source Oral, resp. rate (!) 23, height 5\' 3"  (1.6 m), weight 72 kg, SpO2 98 %., O2 on 4 L nasal cannular, no c/o shortness of breath, no c/o chest pain, no distress noted. Tele # [phone] placed and pt is currently running:sinus tachycardia.  Pt orientation to unit, room and routine. Information packet given to patient/family and safety video watched.  Admission INP armband ID verified with patient/family, and in place. SR up x 2, fall risk assessment complete with Patient and family verbalizing understanding of risks associated with falls. Pt verbalizes an understanding of how to use the call bell and to call for help before getting out of bed.    Will cont to monitor and assist as needed.  Hiram Comber, RN 01/08/2019 5:56 PM

## 2019-01-08 NOTE — ED Provider Notes (Signed)
[facility] EMERGENCY DEPARTMENT Provider Note   CSN: [id number] Arrival date & time: 01/08/19  1258     History   Chief Complaint Chief Complaint  Patient presents with  . Shortness of Breath    HPI Hannah Galvan is a 85 y.o. female with a PMHx of LVH, HOCM, remote breast cancer s/p radiation and lumpectomy, HTN, HLD, hypothyroidism, alzheimer's, and other conditions listed below, who presents to the ED accompanied by her daughter-in-law with complaints of cough and confusion x4-5 days.  Patient's daughter-in-law states that she has been seeming confused for the last 4 to 5 days, not acting herself, saying stuff that does not make sense such as the fact that she was going to leave her assisted living facility, and that people were staying the night there.  This happened 2 weeks ago when she had a UTI, but had improved until about 4 to 5 days ago when this started again.  She started also having a cough with yellow sputum production, and fever.  Tmax 103.1 here.  She has also had some congestion, shortness of breath, fatigue, and body aches.  No known aggravating factors.  She says that she took 2 Tylenol this morning which did not seem to do anything, but her daughter-in-law wonders whether she actually took them or not.  She was given Tylenol here which did improve her fever down to 99.7.  Per chart review, pt was seen at [facility] today for these complaints; had CXR which was negative but they presumptively diagnosed her with RLL PNA and sent in an rx for augmentin however then they instead told her to come to the ED for further management; she also had a flu test which was negative, and U/A which showed trace blood but no nitrites/leuks.   She denies any sore throat, ear pain or drainage, chest pain, leg swelling, abdominal pain, nausea, vomiting, diarrhea, constipation, dysuria, hematuria, urinary frequency, numbness, tingling, focal weakness, or any other complaints at this  time.  Her PCP is at [facility].  The history is provided by the patient and medical records. No language interpreter was used.  Shortness of Breath  Associated symptoms: cough and fever   Associated symptoms: no abdominal pain, no chest pain, no ear pain, no sore throat and no vomiting     Past Medical History:  Diagnosis Date  . Allergy   . Anxiety   . Arthritis   . Blood transfusion without reported diagnosis   . Cancer ([place])    breast, s/p RXT, bilateral  . Cataract   . History of echocardiogram    a. Echo 8/16: Vigorous LVF, EF 78-58%, grade 1 diastolic dysfunction, trivial AI, mild MR, mild RVE, normal RV function, PASP 33 mmHg // b. Echo 1/17 EF 65-70%, normal wall motion, grade 1 diastolic dysfunction, trivial AI, MAC, mild MR // c. Echo 11/26/16: mild LVH, EF 60-65, mild AI, mild MR, mild LAE, mild to mod TR  . History of stress test    Lexiscan Myoview 8/16:  EF 81%, breast attenuation, no ischemia; Low Risk // Myoview 11/26/16: EF 75, apical defect c/w soft tissue attenuation, no ischemia or scar; Low Risk  . HOCM (hypertrophic obstructive cardiomyopathy) ([place])   . Hyperlipidemia   . Hypertension   . Hypothyroidism   . Shortness of breath   . Thyroid disease     Patient Active Problem List   Diagnosis Date Noted  . Fall at home, initial encounter 03/02/2018  .  Head contusion 03/02/2018  . HOCM (hypertrophic obstructive cardiomyopathy) ([place])   . Alzheimer's dementia without behavioral disturbance ([place]) 01/27/2017  . Chest pain 11/17/2015  . Dyspnea 12/06/2014  . Anxiety and depression 09/03/2014  . CN (constipation) 09/03/2014  . Essential hypertension 08/28/2014  . Status post left knee replacement 08/28/2014  . Acute delirium 06/05/2013  . Hyponatremia 06/05/2013  . Acute urinary retention 06/05/2013  . Hypokalemia 06/05/2013  . Fever, unspecified 06/05/2013  . Hypothyroidism 06/05/2013  . Left knee DJD 06/02/2013    Class: Chronic  . UTI (lower urinary  tract infection) 08/31/2012  . Heart murmur, aortic 07/15/2012  . Dizziness - giddy 07/15/2012  . Gait disturbance 12/21/2011  . Osteopenia 12/21/2011  . Cholelithiasis with cholecystitis without obstruction 12/21/2011  . Liver hemangioma 12/21/2011  . Pulmonary nodule, right 12/21/2011  . Breast cancer ([place]) 12/03/2011  . Edema of both legs 09/15/2011  . Thyroid disease   . Hyperlipidemia   . Arthritis   . Cancer [facility])     Past Surgical History:  Procedure Laterality Date  . ABDOMINAL HYSTERECTOMY  2008  . bladder tact    . BREAST LUMPECTOMY Right 2000  . BREAST LUMPECTOMY Left 2007  . CATARACT EXTRACTION  2011  . JOINT REPLACEMENT    . KNEE SURGERY     right  . ROTATOR CUFF REPAIR     right  . THYROID SURGERY  2002  . TOTAL KNEE ARTHROPLASTY Left 06/02/2013   Dr Rhona Raider  . TOTAL KNEE ARTHROPLASTY Right 06/02/2013   Procedure: RIGHT TOTAL KNEE ARTHROPLASTY;  Surgeon: Hessie Dibble, MD;  Location: [facility];  Service: Orthopedics;  Laterality: Right;  . TOTAL KNEE ARTHROPLASTY Left 08/22/2014   Procedure: TOTAL KNEE ARTHROPLASTY;  Surgeon: Hessie Dibble, MD;  Location: [facility];  Service: Orthopedics;  Laterality: Left;     OB History    Gravida  2   Para  2   Term      Preterm      AB      Living        SAB      TAB      Ectopic      Multiple      Live Births               Home Medications    Prior to Admission medications   Medication Sig Start Date End Date Taking? Authorizing Provider  aspirin 81 MG tablet Take 81 mg by mouth daily.   Yes [provider]  Calcium Carbonate-Vit D-Min (GNP CALCIUM 1200) 1200-1000 MG-UNIT CHEW 1 tab PO QAM Patient taking differently: Chew 1 tablet by mouth.  08/05/17  Yes Susy Frizzle, MD  cholecalciferol (VITAMIN D) 1000 units tablet Take 1,000 Units by mouth daily.   Yes [provider]  Cranberry 500 MG TABS Take 500 mg by mouth daily.    Yes [provider]  exemestane  (AROMASIN) 25 MG tablet TAKE 1 TAB BY MOUTH EVERY DAY Patient taking differently: Take 25 mg by mouth daily after breakfast.  12/13/18  Yes Pickard, Cammie Mcgee, MD  folic acid (FOLVITE) 1 MG tablet TAKE 1 TAB BY MOUTH EVERY DAY Patient taking differently: Take 1 mg by mouth daily.  12/22/18  Yes Susy Frizzle, MD  levothyroxine (SYNTHROID, LEVOTHROID) 125 MCG tablet TAKE 1 TAB BY MOUTH EVERY DAY Patient taking differently: Take 125 mcg by mouth daily before breakfast. TAKE 1 TAB BY MOUTH EVERY DAY 12/30/18  Yes Susy Frizzle, MD  LORazepam (ATIVAN) 1 MG tablet 1 tab po qhs Patient taking differently: Take 1 mg by mouth at bedtime.  09/16/18  Yes Susy Frizzle, MD  losartan (COZAAR) 100 MG tablet TAKE 1 TAB BY MOUTH EVERY DAY FOR HTN Patient taking differently: Take 100 mg by mouth daily.  01/03/19  Yes Susy Frizzle, MD  Multiple Vitamins-Minerals (CENTRUM SILVER ADULT 50+ PO) Take 1 tablet by mouth daily.    Yes [provider]  nebivolol (BYSTOLIC) 5 MG tablet TAKE 1 TAB BY MOUTH EVERY DAY FOR HTN Patient taking differently: Take 5 mg by mouth daily.  01/07/19  Yes Susy Frizzle, MD  rosuvastatin (CRESTOR) 10 MG tablet TAKE 1 TABLET BY MOUTH EVERY DAY AT 6PM Patient taking differently: Take 10 mg by mouth daily.  07/16/17  Yes Susy Frizzle, MD  terbinafine (LAMISIL) 250 MG tablet Take 250 mg by mouth daily. 12/21/18  Yes [provider]  acetaminophen (TYLENOL) 325 MG tablet TAKE 1 TAB BY MOUTH EVERY 6 HOURS AS NEEDED FOR MILD PAIN 12/22/18   Susy Frizzle, MD  amLODipine (NORVASC) 5 MG tablet TAKE 1 TABLET BY MOUTH EVERY DAY 05/03/18   Susy Frizzle, MD  donepezil (ARICEPT) 5 MG tablet TAKE 1 TABLET (5 MG TOTAL) BY MOUTH AT BEDTIME. 06/12/17   Susy Frizzle, MD  hydrochlorothiazide (HYDRODIURIL) 25 MG tablet Take 1 tablet (25 mg total) by mouth daily. 07/21/17   Susy Frizzle, MD  KLOR-CON M20 20 MEQ tablet TAKE 1 TABLET BY MOUTH DAILY 11/10/16    Susy Frizzle, MD  nitroGLYCERIN (NITROSTAT) 0.4 MG SL tablet Place 1 tablet (0.4 mg total) under the tongue every 5 (five) minutes as needed for chest pain. 11/29/15   Richardson Dopp T, PA-C  sertraline (ZOLOFT) 100 MG tablet Take 1 tablet (100 mg total) by mouth daily. Patient taking differently: Take 100 mg by mouth daily. Pt is weaning down off this 02/16/18   Susy Frizzle, MD  triamcinolone cream (KENALOG) 0.1 % Apply 1 application topically 2 (two) times daily. 02/16/18   Susy Frizzle, MD    Family History Family History  Problem Relation Age of Onset  . Hypertension Mother   . Stroke Mother   . Cancer Father   . Cancer Sister        leukemia  . Cancer Brother        leukemia  . Cancer Sister        vaginal  . Cancer Sister        bladder  . Cancer Brother        prostate, skin  . Cancer Brother        colon  . Cancer Brother        prostate, bone mets  . Cancer Brother        skin  . Heart disease Sister     Social History Social History   Tobacco Use  . Smoking status: Never Smoker  . Smokeless tobacco: Never Used  . Tobacco comment: never used tobacco  Substance Use Topics  . Alcohol use: No    Alcohol/week: 0.0 standard drinks  . Drug use: No     Allergies   Ambien [zolpidem]; Pyridium [phenazopyridine hcl]; and Sulfa antibiotics   Review of Systems Review of Systems  Constitutional: Positive for fatigue and fever.  HENT: Positive for congestion. Negative for ear discharge, ear pain and sore throat.   Respiratory:  Positive for cough and shortness of breath.   Cardiovascular: Negative for chest pain and leg swelling.  Gastrointestinal: Negative for abdominal pain, constipation, diarrhea, nausea and vomiting.  Genitourinary: Negative for dysuria, frequency and hematuria.  Musculoskeletal: Positive for myalgias. Negative for arthralgias.  Skin: Negative for color change.  Allergic/Immunologic: Negative for immunocompromised state.    Neurological: Negative for weakness and numbness.  Psychiatric/Behavioral: Positive for confusion.   All other systems reviewed and are negative for acute change except as noted in the HPI.    Physical Exam Updated Vital Signs BP (!) 151/70 (BP Location: Right Arm)   Pulse 89   Temp (!) 103.1 F (39.5 C) (Oral)   Resp 20   SpO2 96%   Physical Exam Vitals signs and nursing note reviewed.  Constitutional:      General: She is not in acute distress.    Appearance: Normal appearance. She is well-developed. She is not toxic-appearing.     Comments: Febrile to 103.1 initially, down to 99.7 on exam after tylenol, nontoxic, NAD  HENT:     Head: Normocephalic and atraumatic.     Nose: Congestion present.     Mouth/Throat:     Mouth: Mucous membranes are moist.     Pharynx: Posterior oropharyngeal erythema present. No pharyngeal swelling, oropharyngeal exudate or uvula swelling.     Tonsils: No tonsillar exudate or tonsillar abscesses.     Comments: Nose with mild mucosal edema. Oropharynx mildly injected, without uvular swelling or deviation, no trismus or drooling, no tonsillar swelling, no exudates. No PTA  Eyes:     General:        Right eye: No discharge.        Left eye: No discharge.     Conjunctiva/sclera: Conjunctivae normal.  Neck:     Musculoskeletal: Normal range of motion and neck supple.  Cardiovascular:     Rate and Rhythm: Normal rate and regular rhythm.     Pulses: Normal pulses.     Heart sounds: S1 normal and S2 normal. Murmur present. Systolic murmur present. No friction rub. No gallop.      Comments: Loud holosystolic murmur at LSB Trace b/l pedal edema around the ankles Pulmonary:     Effort: Pulmonary effort is normal. No respiratory distress.     Breath sounds: Rhonchi present. No decreased breath sounds, wheezing or rales.     Comments: Faint scattered rhonchi throughout with harsh cough during exam. No wheezing or rales, no hypoxia or increased WOB,  speaking in full sentences, SpO2 96% on RA  Abdominal:     General: Bowel sounds are normal. There is no distension.     Palpations: Abdomen is soft. Abdomen is not rigid.     Tenderness: There is no abdominal tenderness. There is no right CVA tenderness, left CVA tenderness, guarding or rebound. Negative signs include Murphy's sign and McBurney's sign.  Musculoskeletal: Normal range of motion.  Skin:    General: Skin is warm and dry.     Findings: No rash.  Neurological:     Mental Status: She is alert and oriented to person, place, and time.     Sensory: Sensation is intact. No sensory deficit.     Motor: Motor function is intact.     Comments: A&O x4  Psychiatric:        Mood and Affect: Mood and affect normal.        Behavior: Behavior normal.      ED Treatments / Results  Labs (  all labs ordered are listed, but only abnormal results are displayed) Labs Reviewed  COMPREHENSIVE METABOLIC PANEL - Abnormal; Notable for the following components:      Result Value   Glucose, Bld 127 (*)    AST 46 (*)    GFR calc non Af Amer 55 (*)    All other components within normal limits  CBC WITH DIFFERENTIAL/PLATELET - Abnormal; Notable for the following components:   WBC 13.3 (*)    Platelets 145 (*)    Neutro Abs 10.7 (*)    Monocytes Absolute 1.1 (*)    All other components within normal limits  URINALYSIS, ROUTINE W REFLEX MICROSCOPIC - Abnormal; Notable for the following components:   APPearance CLOUDY (*)    Glucose, UA 50 (*)    Ketones, ur 20 (*)    Protein, ur 100 (*)    Bacteria, UA RARE (*)    All other components within normal limits  GROUP A STREP BY PCR  CULTURE, BLOOD (ROUTINE X 2)  CULTURE, BLOOD (ROUTINE X 2)  URINE CULTURE  LACTIC ACID, PLASMA  PROCALCITONIN    POCT Influenza A/B (01/08/2019 11:54 AM EST)  Component Value Ref Range Performed At Pathologist Signature  Rapid Influenza A Ag Negative Negative [facility]5 [facility]   Rapid Influenza B Ag  Negative Negative PCRO5 [facility]   QC VERIFIED AND ACCEPTABLE Yes  PCRO5 [facility]   LOT NUMBER [id number]  PCRO5 [facility]   Exp. Date 07/27/2020  PCRO5 [facility]      EKG EKG Interpretation  Date/Time:  Saturday January 08 2019 13:18:21 EST Ventricular Rate:  90 PR Interval:  142 QRS Duration: 126 QT Interval:  384 QTC Calculation: 469 R Axis:   -36 Text Interpretation:  Normal sinus rhythm Left axis deviation Left bundle branch block Abnormal ECG When compared to prior, no significant changes seen.  No STEMI Confirmed by Antony Blackbird [phone]) on 01/08/2019 1:46:16 PM   Radiology No results found.  XR CHEST PA AND LATERAL 01/08/2019: [facility] Result Impression    No acute cardiomegaly disease.  Result Narrative  XR CHEST PA AND LATERAL, 01/08/2019 12:00 PM  INDICATION:cough and fever \ R50.9 Fever, unspecified fever cause  COMPARISON: None.  This examination and preliminary report were reviewed under the supervision of the senior diagnostic radiology resident on call.  FINDINGS: Support Devices: None. Cardiovascular/Mediastinum: Cardiomediastinal silhouette is within normal limits. Pulmonary vasculature is unremarkable.  Lungs/pleura: Mild prominence of the interstitium, likely chronic. No pleural effusion. No pneumothorax. No edema or focal airspace disease. Upper abdomen: Visible portions of the upper abdomen are within normal limits.  Chest wall/osseous structures: No acute osseous abnormality.  Other: None.  Other Result Information  Interface, Rad Results In - 01/08/2019 12:31 PM EST XR CHEST PA AND LATERAL, 01/08/2019 12:00 PM  INDICATION:cough and fever \ R50.9 Fever, unspecified fever cause  COMPARISON: None.  This examination and preliminary report were reviewed under the supervision of the senior diagnostic radiology resident on call.  FINDINGS: Support Devices: None. Cardiovascular/Mediastinum:  Cardiomediastinal silhouette is within normal limits. Pulmonary vasculature is unremarkable.  Lungs/pleura: Mild prominence of the interstitium, likely chronic. No pleural effusion.  No pneumothorax. No edema or focal airspace disease. Upper abdomen: Visible portions of the upper abdomen are within normal limits.  Chest wall/osseous structures: No acute osseous abnormality.  Other: None.  CONCLUSION:   No acute cardiomegaly disease.     Procedures Procedures (including critical  care time)  CRITICAL CARE-sepsis Performed by: Reece Agar   Total critical care time: 35 minutes  Critical care time was exclusive of separately billable procedures and treating other patients.  Critical care was necessary to treat or prevent imminent or life-threatening deterioration.  Critical care was time spent personally by me on the following activities: development of treatment plan with patient and/or surrogate as well as nursing, discussions with consultants, evaluation of patient's response to treatment, examination of patient, obtaining history from patient or surrogate, ordering and performing treatments and interventions, ordering and review of laboratory studies, ordering and review of radiographic studies, pulse oximetry and re-evaluation of patient's condition.   Medications Ordered in ED Medications  sodium chloride flush (NS) 0.9 % injection 3 mL (has no administration in time range)  sodium chloride 0.9 % bolus 1,000 mL (1,000 mLs Intravenous New Bag/Given 01/08/19 1500)    And  sodium chloride 0.9 % bolus 1,000 mL (1,000 mLs Intravenous New Bag/Given 01/08/19 1457)    And  sodium chloride 0.9 % bolus 250 mL (has no administration in time range)  cefTRIAXone (ROCEPHIN) 2 g in sodium chloride 0.9 % 100 mL IVPB (2 g Intravenous New Bag/Given 01/08/19 1457)  azithromycin (ZITHROMAX) 500 mg in sodium chloride 0.9 % 250 mL IVPB (500 mg Intravenous New Bag/Given 01/08/19 1503)    acetaminophen (TYLENOL) tablet 650 mg (650 mg Oral Given 01/08/19 1325)  albuterol (PROVENTIL) (2.5 MG/3ML) 0.083% nebulizer solution 5 mg (5 mg Nebulization Given 01/08/19 1505)  ipratropium (ATROVENT) nebulizer solution 0.5 mg (0.5 mg Nebulization Given 01/08/19 1505)     Initial Impression / Assessment and Plan / ED Course  I have reviewed the triage vital signs and the nursing notes.  Pertinent labs & imaging results that were available during my care of the patient were reviewed by me and considered in my medical decision making (see chart for details).     85 y.o. female here for confusion and cough and febrile illness for the last 4 to 5 days.  Patient's family states that she has been saying things that do not make sense such as the fact that she is going to leave her facility, and that people are staying over at her facility.  She has had a harsh cough with yellow sputum production any fever with Tmax 103.1 here.  On exam, temp down to 99.7 after Tylenol, throat mildly erythematous but no tonsillar swelling or exudate, nose mildly congested, lungs with faint rhonchi scattered throughout but no wheezing or rales, harsh cough noted during exam, alert and oriented x4 however family states that she is not acting herself.  No abdominal tenderness.  Chest x-ray done at urgent care today did not show any focal consolidation or any other acute findings, but she was presumed to have pneumonia and was told to come here for further management.  Flu test there was negative, UA there was also negative aside from trace blood.  Work-up thus far here shows CBC with WBC 13.3, so she meets sepsis criteria given her fever and white count.  Lactic acid negative.  CMP pending.  EKG nonischemic without acute findings.  I feel it is reasonable to presume that she has a possible early pneumonia given her clinical symptoms, doubt need for repeat chest x-ray since this was already done today.  Doubt need for repeat flu  testing.  Will obtain strep test, U/A, and procalcitonin, but call code sepsis and proceed with weight-based fluids and IV antibiotics for presumed pneumonia.  Will give duonebs as well. Will likely proceed with admission once work-up is completed.  Discussed case with my attending Dr. Sherry Ruffing who agrees with plan.  3:38 PM CMP with mildly elevated AST 46, but otherwise fairly unremarkable. U/A with some protein and ketones, nitrite/leuk neg, rare bacteria, 0-5 WBC and RBCs, no evidence of UTI, UCx sent. Strep test and Procalcitonin pending. Pt feeling better after nebs and lung sounds improved. Will proceed with admission.   3:56 PM Dr. Lupita Leash of [facility] returning page and will admit. Holding orders to be placed by admitting team. Please see their notes for further documentation of care. I appreciate their help with this pleasant pt's care. Pt stable at time of admission.    Final Clinical Impressions(s) / ED Diagnoses   Final diagnoses:  Community acquired pneumonia, unspecified laterality  Confusion  Sepsis, due to unspecified organism, unspecified whether acute organ dysfunction present ([place])  SOB (shortness of breath)    ED Discharge Orders    [address], [place], [place] 01/08/19 1556    Tegeler, Gwenyth Allegra, MD 01/08/19 2020

## 2019-01-08 NOTE — ED Notes (Signed)
Got patient undress on the monitor wheeled patient to the bathroom patient is now back in bed with call bell in reach and family at bedside

## 2019-01-08 NOTE — ED Triage Notes (Signed)
Pt in c/o shortness of breath for the last few days, negative chest xray completed at an urgent care earlier today, no distress noted

## 2019-01-09 ENCOUNTER — Observation Stay (HOSPITAL_COMMUNITY): Payer: Medicare Other

## 2019-01-09 DIAGNOSIS — J40 Bronchitis, not specified as acute or chronic: Secondary | ICD-10-CM | POA: Diagnosis present

## 2019-01-09 DIAGNOSIS — G309 Alzheimer's disease, unspecified: Secondary | ICD-10-CM | POA: Diagnosis present

## 2019-01-09 DIAGNOSIS — F419 Anxiety disorder, unspecified: Secondary | ICD-10-CM | POA: Diagnosis present

## 2019-01-09 DIAGNOSIS — E785 Hyperlipidemia, unspecified: Secondary | ICD-10-CM | POA: Diagnosis present

## 2019-01-09 DIAGNOSIS — E039 Hypothyroidism, unspecified: Secondary | ICD-10-CM | POA: Diagnosis present

## 2019-01-09 DIAGNOSIS — Z882 Allergy status to sulfonamides status: Secondary | ICD-10-CM | POA: Diagnosis not present

## 2019-01-09 DIAGNOSIS — Z96653 Presence of artificial knee joint, bilateral: Secondary | ICD-10-CM | POA: Diagnosis present

## 2019-01-09 DIAGNOSIS — I10 Essential (primary) hypertension: Secondary | ICD-10-CM | POA: Diagnosis not present

## 2019-01-09 DIAGNOSIS — Z923 Personal history of irradiation: Secondary | ICD-10-CM | POA: Diagnosis not present

## 2019-01-09 DIAGNOSIS — G9341 Metabolic encephalopathy: Secondary | ICD-10-CM | POA: Diagnosis not present

## 2019-01-09 DIAGNOSIS — Z8249 Family history of ischemic heart disease and other diseases of the circulatory system: Secondary | ICD-10-CM | POA: Diagnosis not present

## 2019-01-09 DIAGNOSIS — I421 Obstructive hypertrophic cardiomyopathy: Secondary | ICD-10-CM | POA: Diagnosis present

## 2019-01-09 DIAGNOSIS — J9601 Acute respiratory failure with hypoxia: Secondary | ICD-10-CM

## 2019-01-09 DIAGNOSIS — Z90710 Acquired absence of both cervix and uterus: Secondary | ICD-10-CM | POA: Diagnosis not present

## 2019-01-09 DIAGNOSIS — B974 Respiratory syncytial virus as the cause of diseases classified elsewhere: Secondary | ICD-10-CM | POA: Diagnosis not present

## 2019-01-09 DIAGNOSIS — R0602 Shortness of breath: Secondary | ICD-10-CM | POA: Diagnosis present

## 2019-01-09 DIAGNOSIS — A419 Sepsis, unspecified organism: Secondary | ICD-10-CM | POA: Diagnosis not present

## 2019-01-09 DIAGNOSIS — Z7982 Long term (current) use of aspirin: Secondary | ICD-10-CM | POA: Diagnosis not present

## 2019-01-09 DIAGNOSIS — Z888 Allergy status to other drugs, medicaments and biological substances status: Secondary | ICD-10-CM | POA: Diagnosis not present

## 2019-01-09 DIAGNOSIS — Z66 Do not resuscitate: Secondary | ICD-10-CM | POA: Diagnosis present

## 2019-01-09 DIAGNOSIS — Z853 Personal history of malignant neoplasm of breast: Secondary | ICD-10-CM | POA: Diagnosis not present

## 2019-01-09 DIAGNOSIS — Z87440 Personal history of urinary (tract) infections: Secondary | ICD-10-CM | POA: Diagnosis not present

## 2019-01-09 DIAGNOSIS — R5381 Other malaise: Secondary | ICD-10-CM | POA: Diagnosis present

## 2019-01-09 DIAGNOSIS — M1990 Unspecified osteoarthritis, unspecified site: Secondary | ICD-10-CM | POA: Diagnosis present

## 2019-01-09 DIAGNOSIS — F0280 Dementia in other diseases classified elsewhere without behavioral disturbance: Secondary | ICD-10-CM | POA: Diagnosis present

## 2019-01-09 LAB — RESPIRATORY PANEL BY PCR
Adenovirus: NOT DETECTED
Bordetella pertussis: NOT DETECTED
CORONAVIRUS 229E-RVPPCR: NOT DETECTED
Chlamydophila pneumoniae: NOT DETECTED
Coronavirus HKU1: NOT DETECTED
Coronavirus NL63: NOT DETECTED
Coronavirus OC43: NOT DETECTED
Influenza A: NOT DETECTED
Influenza B: NOT DETECTED
MYCOPLASMA PNEUMONIAE-RVPPCR: NOT DETECTED
Metapneumovirus: NOT DETECTED
Parainfluenza Virus 1: NOT DETECTED
Parainfluenza Virus 2: NOT DETECTED
Parainfluenza Virus 3: NOT DETECTED
Parainfluenza Virus 4: NOT DETECTED
Respiratory Syncytial Virus: DETECTED — AB
Rhinovirus / Enterovirus: NOT DETECTED

## 2019-01-09 LAB — URINE CULTURE: Culture: NO GROWTH

## 2019-01-09 LAB — C DIFFICILE QUICK SCREEN W PCR REFLEX
C DIFFICILE (CDIFF) INTERP: NOT DETECTED
C Diff antigen: NEGATIVE
C Diff toxin: NEGATIVE

## 2019-01-09 LAB — HIV ANTIBODY (ROUTINE TESTING W REFLEX): HIV SCREEN 4TH GENERATION: NONREACTIVE

## 2019-01-09 LAB — STREP PNEUMONIAE URINARY ANTIGEN: Strep Pneumo Urinary Antigen: NEGATIVE

## 2019-01-09 MED ORDER — IPRATROPIUM-ALBUTEROL 0.5-2.5 (3) MG/3ML IN SOLN
3.0000 mL | Freq: Three times a day (TID) | RESPIRATORY_TRACT | Status: DC
  Administered 2019-01-09 – 2019-01-11 (×7): 3 mL via RESPIRATORY_TRACT
  Filled 2019-01-09 (×7): qty 3

## 2019-01-09 MED ORDER — SODIUM CHLORIDE 0.9 % IV SOLN
1.0000 g | INTRAVENOUS | Status: DC
  Administered 2019-01-09: 1 g via INTRAVENOUS
  Filled 2019-01-09: qty 10

## 2019-01-09 MED ORDER — PREDNISONE 20 MG PO TABS
40.0000 mg | ORAL_TABLET | Freq: Every day | ORAL | Status: DC
  Administered 2019-01-09 – 2019-01-11 (×3): 40 mg via ORAL
  Filled 2019-01-09 (×3): qty 2

## 2019-01-09 NOTE — Progress Notes (Signed)
Dr. Lovena Le notified of patient is positive for RSV.

## 2019-01-09 NOTE — Progress Notes (Signed)
Physical Therapy Evaluation Patient Details Name: Hannah Galvan MRN: [id number] DOB: Jan 06, 1931 Today's Date: 01/09/2019   History of Present Illness  Pt is an 85 yo female s/p cough and confusion x5 days with UTI x2 weeks ago. Pt found to be RSV positive, negative for flu with mild acute encephalopathy. Pmhx: dementia, breast cancer, HLD, B/L TKAs.,    Clinical Impression  Pt admitted with above diagnosis. Patient with poor tolerance for activity this pm. At rest, SaO2 97%on 4L HR 80. With exertion of sitting EOB, SaO2 down to 83% HR 115. Pt required frequent rest breaks throughout session due to decr SaO2 (despite respiratory treatment while seated EOB). She was unable to stand with +1 assist from EOB. Pt currently with functional limitations due to the deficits listed below (see PT Problem List).  Pt will benefit from skilled PT to increase their independence and safety with mobility to allow discharge to the venue listed below.       Follow Up Recommendations SNF(unless she improves and/or ALF staff can care for her )    Equipment Recommendations  Other (comment)(?will need O2)    Recommendations for Other Services       Precautions / Restrictions Precautions Precautions: Fall Precaution Comments: droplet and enteric Restrictions Weight Bearing Restrictions: No      Mobility  Bed Mobility Overal bed mobility: Needs Assistance Bed Mobility: Sidelying to Sit;Sit to Sidelying;Rolling Rolling: Min assist Sidelying to sit: Mod assist;HOB elevated     Sit to sidelying: Mod assist General bed mobility comments: requires rail for rolling and assist; assist for trunk elevation and use of pad to scoot hips to EOB; assist to elevate legs onto bed  Transfers Overall transfer level: Needs assistance Equipment used: None Transfers: Sit to/from Stand;Lateral/Scoot Transfers Sit to Stand: Max assist(unable with +1 assist)        Lateral/Scoot Transfers: Max assist General  transfer comment: very fatigued and SaO2 decr to 83% with effort  Ambulation/Gait                Stairs            Wheelchair Mobility    Modified Rankin (Stroke Patients Only)       Balance Overall balance assessment: Needs assistance Sitting-balance support: Single extremity supported;Feet supported Sitting balance-Leahy Scale: Poor Sitting balance - Comments: sat EOB x 5 minutes for breathing treatment                                     Pertinent Vitals/Pain Pain Assessment: No/denies pain    Home Living Family/patient expects to be discharged to:: Assisted living               Home Equipment: Walker - 4 wheels Additional Comments: does not use O2 at ALF    Prior Function Level of Independence: Independent with assistive device(s)         Comments: reports she uses a rollator independently     Hand Dominance   Dominant Hand: Right    Extremity/Trunk Assessment   Upper Extremity Assessment Upper Extremity Assessment: Generalized weakness    Lower Extremity Assessment Lower Extremity Assessment: Generalized weakness    Cervical / Trunk Assessment Cervical / Trunk Assessment: Kyphotic  Communication   Communication: HOH  Cognition Arousal/Alertness: Awake/alert Behavior During Therapy: Flat affect Overall Cognitive Status: No family/caregiver present to determine baseline cognitive functioning  General Comments: ?addtitional cues due to decr hearing      General Comments General comments (skin integrity, edema, etc.): pt could not recall name of ALF where she lives; SaO2 decreasing with effort and when supine with rolling to change bed linens after incontinence    Exercises     Assessment/Plan    PT Assessment Patient needs continued PT services  PT Problem List Decreased strength;Decreased activity tolerance;Decreased balance;Decreased mobility;Cardiopulmonary  status limiting activity       PT Treatment Interventions DME instruction;Gait training;Functional mobility training;Therapeutic activities;Therapeutic exercise;Balance training;Cognitive remediation;Patient/family education    PT Goals (Current goals can be found in the Care Plan section)  Acute Rehab PT Goals Patient Stated Goal: to go home PT Goal Formulation: With patient Time For Goal Achievement: 01/23/19 Potential to Achieve Goals: Fair    Frequency Min 3X/week   Barriers to discharge Decreased caregiver support ?can be managed at ALF or will need SNF    Co-evaluation               AM-PAC PT "6 Clicks" Mobility  Outcome Measure Help needed turning from your back to your side while in a flat bed without using bedrails?: A Lot Help needed moving from lying on your back to sitting on the side of a flat bed without using bedrails?: Total Help needed moving to and from a bed to a chair (including a wheelchair)?: Total Help needed standing up from a chair using your arms (e.g., wheelchair or bedside chair)?: Total Help needed to walk in hospital room?: Total Help needed climbing 3-5 steps with a railing? : Total 6 Click Score: 7    End of Session Equipment Utilized During Treatment: Oxygen(4L) Activity Tolerance: Patient limited by fatigue;Treatment limited secondary to medical complications (Comment)(decr Sa)2) Patient left: in bed;with call bell/phone within reach;with bed alarm set   PT Visit Diagnosis: Muscle weakness (generalized) (M62.81);Difficulty in walking, not elsewhere classified (R26.2)    Time: 5364-6803 PT Time Calculation (min) (ACUTE ONLY): 51 min   Charges:   PT Evaluation $PT Eval High Complexity: 1 High PT Treatments $Therapeutic Activity: 8-22 mins          [employer], PT 01/09/2019, 4:18 PM

## 2019-01-09 NOTE — Progress Notes (Signed)
Received report from night shift nurse that patient had 5+ loose bowel movements overnight. Patient placed in enteric precautions and cdiff sample was sent down.   Cdiff came back negative. Enteric precautions discontinued.

## 2019-01-09 NOTE — Evaluation (Signed)
Occupational Therapy Evaluation Patient Details Name: Hannah Galvan MRN: [id number] DOB: 02-23-1931 Today's Date: 01/09/2019    History of Present Illness Pt is an 85 yo female s/p cough and confusion x5 days with UTI x2 weeks ago. Pt found to be RSV positive, negative for flu with mild acute encephalopathy. Pmhx: dementia, breast cancer, HLD, B/L TKAs.,   Clinical Impression   Pt PTA: living in ALF and reports independence with ADL and using rollator for mobility. No family in room to confirm info. Pt currrently limited by confusion and decreased strength and mobility. Pt requires assist with LB ADL with minA to maxA as pt requires cues to attend to task. Pt lethargic. Pt mostly set-upA for UB ADL in sitting. Education about call bell and requesting to sit upright x2 hours. Pt ambulating in room with RW and minA as pt bumping into things on R. Visual scanning, WFLs. Pt coughing up mucus when sitting up. RN aware. Pt would greatly benefit from OT skilled services for endurance, mobility and ADLs in [facility] setting.    Follow Up Recommendations  Home health OT;Supervision/Assistance - 24 hour    Equipment Recommendations  None recommended by OT    Recommendations for Other Services       Precautions / Restrictions Precautions Precautions: Fall Precaution Comments: droplet and enteric Restrictions Weight Bearing Restrictions: No      Mobility Bed Mobility Overal bed mobility: Needs Assistance Bed Mobility: Sidelying to Sit   Sidelying to sit: Mod assist;HOB elevated       General bed mobility comments: requires assist for trunk elevation and use of pad to scoot hips to EOB  Transfers Overall transfer level: Needs assistance Equipment used: Rolling walker (2 wheeled) Transfers: Sit to/from Stand Sit to Stand: From elevated surface;Mod assist         General transfer comment: Requires assist for power up    Balance Overall balance assessment: Mild deficits observed, not  formally tested                                         ADL either performed or assessed with clinical judgement   ADL Overall ADL's : Needs assistance/impaired Eating/Feeding: Set up;Sitting   Grooming: Set up;Sitting   Upper Body Bathing: Set up;Sitting   Lower Body Bathing: Minimal assistance;Sitting/lateral leans;Sit to/from stand   Upper Body Dressing : Set up;Sitting   Lower Body Dressing: Minimal assistance;Sitting/lateral leans   Toilet Transfer: Minimal assistance;Regular Toilet;Grab bars   Toileting- Clothing Manipulation and Hygiene: Maximal assistance;Sitting/lateral lean;Sit to/from stand Toileting - Clothing Manipulation Details (indicate cue type and reason): unable to sequence correctly in standing     Functional mobility during ADLs: Minimal assistance;Rolling walker(used to rollator, bumped into things on R)       Vision Baseline Vision/History: No visual deficits Vision Assessment?: No apparent visual deficits     Perception     Praxis      Pertinent Vitals/Pain Pain Assessment: No/denies pain     Hand Dominance Right   Extremity/Trunk Assessment Upper Extremity Assessment Upper Extremity Assessment: Generalized weakness   Lower Extremity Assessment Lower Extremity Assessment: Generalized weakness;Defer to PT evaluation   Cervical / Trunk Assessment Cervical / Trunk Assessment: Kyphotic   Communication Communication Communication: HOH   Cognition Arousal/Alertness: Lethargic;Suspect due to medications Behavior During Therapy: Flat affect Overall Cognitive Status: No family/caregiver present to determine baseline cognitive functioning  General Comments: requiring cues to attend to task. Followed commands when alert. Pt also very drowsy.   General Comments  Pt slightly confused about situation, but oriented to self and place. Pt having multiple BMs today and nearly maxA for  washing.    Exercises     Shoulder Instructions      Home Living Family/patient expects to be discharged to:: Assisted living                             Home Equipment: Walker - 4 wheels          Prior Functioning/Environment Level of Independence: Independent with assistive device(s)        Comments: no one here to confirm history of falls.        OT Problem List: Decreased strength;Decreased activity tolerance;Impaired balance (sitting and/or standing);Decreased safety awareness;Decreased coordination      OT Treatment/Interventions: Self-care/ADL training;Therapeutic exercise;Neuromuscular education;Energy conservation;DME and/or AE instruction;Therapeutic activities;Cognitive remediation/compensation;Patient/family education;Balance training    OT Goals(Current goals can be found in the care plan section) Acute Rehab OT Goals Patient Stated Goal: to go home OT Goal Formulation: With patient Time For Goal Achievement: 01/23/19 Potential to Achieve Goals: Good  OT Frequency: Min 2X/week   Barriers to D/C:            Co-evaluation              AM-PAC OT "6 Clicks" Daily Activity     Outcome Measure Help from another person eating meals?: None Help from another person taking care of personal grooming?: A Little Help from another person toileting, which includes using toliet, bedpan, or urinal?: A Lot Help from another person bathing (including washing, rinsing, drying)?: A Lot Help from another person to put on and taking off regular upper body clothing?: A Little Help from another person to put on and taking off regular lower body clothing?: A Lot 6 Click Score: 16   End of Session Equipment Utilized During Treatment: Gait belt;Rolling walker Nurse Communication: Mobility status  Activity Tolerance: Patient limited by fatigue Patient left: in chair;with call bell/phone within reach;with chair alarm set  OT Visit Diagnosis: Unsteadiness  on feet (R26.81);Muscle weakness (generalized) (M62.81)                Time: 1000-1045 OT Time Calculation (min): 45 min Charges:  OT General Charges $OT Visit: 1 Visit OT Evaluation $OT Eval Moderate Complexity: 1 Mod OT Treatments $Self Care/Home Management : 8-22 mins $Neuromuscular Re-education: 8-22 mins  Ebony Hail Harold Hedge) Marsa Aris OTR/L Acute Rehabilitation Services Pager: [phone] Office: [phone]   Fredda Hammed 01/09/2019, 12:04 PM

## 2019-01-09 NOTE — Progress Notes (Signed)
Patient's temp 100.11F at 0912. PRN Tylenol administered. Temp recheck 98.42F.

## 2019-01-09 NOTE — Progress Notes (Signed)
Patient ID: Hannah Galvan, female   DOB: 1931/08/10, 85 y.o.   MRN: [id number]  PROGRESS NOTE    Hannah Galvan  QMV:[id number] DOB: [birth year] DOA: 01/08/2019 PCP: Susy Frizzle, MD   Brief Narrative:  85 year old female with history of hypothyroidism, hypertension, hyperlipidemia, mild dementia, HOCM, breast cancer status post radiation and lumpectomy presented with shortness of breath from ALF.  She was found to be febrile.  She was started on IV fluids and antibiotics probably for pneumonia.   Assessment & Plan:   Principal Problem:   Sepsis ([place]) Active Problems:   Thyroid disease   Hyperlipidemia   Breast cancer ([place])   Hypothyroidism   Essential hypertension   Alzheimer's dementia without behavioral disturbance ([place])   Acute metabolic encephalopathy   Sepsis present on admission -Respiratory virus panel is positive for RSV.  Antibiotic plan as below.  Blood pressure stable.  Still having temperatures.  Follow cultures  Acute hypoxic respiratory failure -Probably from RSV bronchitis versus superimposed bacterial pneumonia -Continue oxygen supplementation.  Continue Rocephin and Zithromax for 1 more day. -Follow cultures.  Chest x-ray from this morning shows no infiltrates but findings suggestive of bronchitis.  Will start prednisone 40 mg daily as well.  Leukocytosis -Monitor.  Acute metabolic encephalopathy causing mild confusion in a patient with mild Alzheimer's dementia -Monitor mental status.  Still slightly confused.  CT head pending.  Fall precautions  Hyperlipidemia next-continue statin  History of breast cancer--status post lumpectomy/radiation.  Outpatient follow-up  Hypothyroidism -Continue Synthroid  Essential hypertension--continue current antihypertensive regimen.  Monitor blood pressure  Generalized weakness/debility--PT eval   DVT prophylaxis: Lovenox Code Status: Full Family Communication: None at bedside Disposition Plan: Depends on  clinical outcome  Consultants: None Procedures: None  Antimicrobials:  Anti-infectives (From admission, onward)   Start     Dose/Rate Route Frequency Ordered Stop   01/09/19 1500  cefTRIAXone (ROCEPHIN) 1 g in sodium chloride 0.9 % 100 mL IVPB  Status:  Discontinued     1 g 200 mL/hr over 30 Minutes Intravenous Every 24 hours 01/08/19 1625 01/08/19 1644   01/09/19 1400  azithromycin (ZITHROMAX) 500 mg in sodium chloride 0.9 % 250 mL IVPB     500 mg 250 mL/hr over 60 Minutes Intravenous Every 24 hours 01/08/19 1625 01/14/19 1359   01/08/19 1445  cefTRIAXone (ROCEPHIN) 2 g in sodium chloride 0.9 % 100 mL IVPB  Status:  Discontinued     2 g 200 mL/hr over 30 Minutes Intravenous Every 24 hours 01/08/19 1441 01/08/19 1755   01/08/19 1445  azithromycin (ZITHROMAX) 500 mg in sodium chloride 0.9 % 250 mL IVPB  Status:  Discontinued     500 mg 250 mL/hr over 60 Minutes Intravenous Every 24 hours 01/08/19 1441 01/08/19 1736         Subjective: Patient seen and examined at bedside.  She is awake but slightly confused.  Nursing staff reports few episodes of loose watery diarrhea this morning.  Still having temperature spikes.  Objective: Vitals:   01/09/19 0537 01/09/19 0840 01/09/19 0912 01/09/19 1111  BP: (!) 163/76  (!) 154/72   Pulse: 89  90   Resp:      Temp: 97.8 F (36.6 C)  (!) 100.9 F (38.3 C) 98.5 F (36.9 C)  TempSrc: Oral  Oral Oral  SpO2: 95% 98%    Weight:      Height:        Intake/Output Summary (Last 24 hours) at 01/09/2019 1148 Last data filed  at 01/08/2019 1719 Gross per 24 hour  Intake 3600 ml  Output -  Net 3600 ml   Filed Weights   01/08/19 1743  Weight: 72 kg    Examination:  General exam: Elderly female sitting on chair.  No distress.  Awake but slightly confused Respiratory system: Bilateral decreased breath sounds at bases with some scattered crackles Cardiovascular system: S1 & S2 heard, Rate controlled Gastrointestinal system: Abdomen is  nondistended, soft and nontender. Normal bowel sounds heard. Extremities: No cyanosis, clubbing, edema   Data Reviewed: I have personally reviewed following labs and imaging studies  CBC: Recent Labs  Lab 01/08/19 1338  WBC 13.3*  NEUTROABS 10.7*  HGB 13.7  HCT 43.5  MCV 96.9  PLT 323*   Basic Metabolic Panel: Recent Labs  Lab 01/08/19 1338  NA 138  K 4.0  CL 105  CO2 23  GLUCOSE 127*  BUN 17  CREATININE 0.95  CALCIUM 9.3   GFR: Estimated Creatinine Clearance: 41.1 mL/min (by C-G formula based on SCr of 0.95 mg/dL). Liver Function Tests: Recent Labs  Lab 01/08/19 1338  AST 46*  ALT 35  ALKPHOS 47  BILITOT 0.9  PROT 7.6  ALBUMIN 4.1   No results for input(s): LIPASE, AMYLASE in the last 168 hours. No results for input(s): AMMONIA in the last 168 hours. Coagulation Profile: No results for input(s): INR, PROTIME in the last 168 hours. Cardiac Enzymes: No results for input(s): CKTOTAL, CKMB, CKMBINDEX, TROPONINI in the last 168 hours. BNP (last 3 results) No results for input(s): PROBNP in the last 8760 hours. HbA1C: No results for input(s): HGBA1C in the last 72 hours. CBG: No results for input(s): GLUCAP in the last 168 hours. Lipid Profile: No results for input(s): CHOL, HDL, LDLCALC, TRIG, CHOLHDL, LDLDIRECT in the last 72 hours. Thyroid Function Tests: No results for input(s): TSH, T4TOTAL, FREET4, T3FREE, THYROIDAB in the last 72 hours. Anemia Panel: No results for input(s): VITAMINB12, FOLATE, FERRITIN, TIBC, IRON, RETICCTPCT in the last 72 hours. Sepsis Labs: Recent Labs  Lab 01/08/19 1338 01/08/19 1842  PROCALCITON  --  0.12  LATICACIDVEN 1.9  --     Recent Results (from the past 240 hour(s))  Blood Culture (routine x 2)     Status: None (Preliminary result)   Collection Time: 01/08/19  2:10 PM  Result Value Ref Range Status   Specimen Description BLOOD LEFT FOREARM  Final   Special Requests   Final    BOTTLES DRAWN AEROBIC AND ANAEROBIC  Blood Culture adequate volume   Culture   Final    NO GROWTH < 12 HOURS Performed at [facility], [address] [place], [place] [address]    Report Status PENDING  Incomplete  Blood Culture (routine x 2)     Status: None (Preliminary result)   Collection Time: 01/08/19  2:20 PM  Result Value Ref Range Status   Specimen Description BLOOD RIGHT ANTECUBITAL  Final   Special Requests   Final    BOTTLES DRAWN AEROBIC AND ANAEROBIC Blood Culture results may not be optimal due to an excessive volume of blood received in culture bottles   Culture   Final    NO GROWTH < 12 HOURS Performed at [facility], [place] [address] [place], [place] [address]    Report Status PENDING  Incomplete  Group A Strep by PCR     Status: None   Collection Time: 01/08/19  2:39 PM  Result Value Ref Range Status  Group A Strep by PCR NOT DETECTED NOT DETECTED Final    Comment: Performed at [facility], [place] [address], [place], [place] [address]  Urine culture     Status: None   Collection Time: 01/08/19  2:41 PM  Result Value Ref Range Status   Specimen Description URINE, RANDOM  Final   Special Requests NONE  Final   Culture   Final    NO GROWTH Performed at [facility], [place] [address] [place], [place] [address]    Report Status 01/09/2019 FINAL  Final  Respiratory Panel by PCR     Status: Abnormal   Collection Time: 01/08/19  4:24 PM  Result Value Ref Range Status   Adenovirus NOT DETECTED NOT DETECTED Final   Coronavirus 229E NOT DETECTED NOT DETECTED Final    Comment: (NOTE) The Coronavirus on the Respiratory Panel, DOES NOT test for the novel  Coronavirus (2019 nCoV)    Coronavirus HKU1 NOT DETECTED NOT DETECTED Final   Coronavirus NL63 NOT DETECTED NOT DETECTED Final   Coronavirus OC43 NOT DETECTED NOT DETECTED Final   Metapneumovirus NOT DETECTED NOT DETECTED Final   Rhinovirus / Enterovirus NOT DETECTED NOT DETECTED Final   Influenza A NOT DETECTED NOT DETECTED  Final   Influenza B NOT DETECTED NOT DETECTED Final   Parainfluenza Virus 1 NOT DETECTED NOT DETECTED Final   Parainfluenza Virus 2 NOT DETECTED NOT DETECTED Final   Parainfluenza Virus 3 NOT DETECTED NOT DETECTED Final   Parainfluenza Virus 4 NOT DETECTED NOT DETECTED Final   Respiratory Syncytial Virus DETECTED (A) NOT DETECTED Final    Comment: CRITICAL RESULT CALLED TO, READ BACK BY AND VERIFIED WITH: TAnnette Stable 0123 01/09/2019 T. TYSOR    Bordetella pertussis NOT DETECTED NOT DETECTED Final   Chlamydophila pneumoniae NOT DETECTED NOT DETECTED Final   Mycoplasma pneumoniae NOT DETECTED NOT DETECTED Final    Comment: Performed at [facility], [place]. [address], [place], [place] [address]         Radiology Studies: Dg Chest Port 1 View  Result Date: 01/09/2019 CLINICAL DATA:  One-week history of cough. EXAM: PORTABLE CHEST 1 VIEW COMPARISON:  03/01/2018 and earlier. FINDINGS: Cardiac silhouette mildly enlarged, unchanged. Thoracic aorta atherosclerotic, unchanged. Hiatal hernia as noted previously. Hilar and mediastinal contours otherwise unremarkable. Prominent bronchovascular markings diffusely and marked central peribronchial thickening, more so than on the prior examinations. No confluent airspace consolidation. Normal pulmonary vascularity. No visible pleural effusions. IMPRESSION: 1. Moderate to severe changes of acute bronchitis and/or asthma without focal airspace pneumonia. 2. Stable mild cardiomegaly without evidence of pulmonary edema. Electronically Signed   By: Evangeline Dakin M.D.   On: 01/09/2019 09:09        Scheduled Meds: . aspirin EC  81 mg Oral Daily  . enoxaparin (LOVENOX) injection  40 mg Subcutaneous Q24H  . exemestane  25 mg Oral QPC breakfast  . folic acid  1 mg Oral Daily  . ipratropium-albuterol  3 mL Nebulization TID  . levothyroxine  125 mcg Oral QAC breakfast  . LORazepam  1 mg Oral QHS  . losartan  100 mg Oral Daily  . nebivolol  5 mg Oral  Daily  . potassium chloride SA  20 mEq Oral Daily  . rosuvastatin  10 mg Oral Daily  . sodium chloride flush  3 mL Intravenous Once   Continuous Infusions: . azithromycin       LOS: 0 days  Aline August, MD [facility] Pager [phone]  If 7PM-7AM, please contact night-coverage [email or web address] Password [facility] 01/09/2019, 11:48 AM

## 2019-01-10 ENCOUNTER — Ambulatory Visit: Payer: Medicare Other | Admitting: Family Medicine

## 2019-01-10 DIAGNOSIS — B974 Respiratory syncytial virus as the cause of diseases classified elsewhere: Secondary | ICD-10-CM

## 2019-01-10 LAB — CBC WITH DIFFERENTIAL/PLATELET
ABS IMMATURE GRANULOCYTES: 0.09 10*3/uL — AB (ref 0.00–0.07)
Basophils Absolute: 0 10*3/uL (ref 0.0–0.1)
Basophils Relative: 0 %
Eosinophils Absolute: 0 10*3/uL (ref 0.0–0.5)
Eosinophils Relative: 0 %
HCT: 36.4 % (ref 36.0–46.0)
Hemoglobin: 12.1 g/dL (ref 12.0–15.0)
Immature Granulocytes: 1 %
Lymphocytes Relative: 8 %
Lymphs Abs: 1 10*3/uL (ref 0.7–4.0)
MCH: 31.5 pg (ref 26.0–34.0)
MCHC: 33.2 g/dL (ref 30.0–36.0)
MCV: 94.8 fL (ref 80.0–100.0)
Monocytes Absolute: 1 10*3/uL (ref 0.1–1.0)
Monocytes Relative: 8 %
Neutro Abs: 10 10*3/uL — ABNORMAL HIGH (ref 1.7–7.7)
Neutrophils Relative %: 83 %
Platelets: 147 10*3/uL — ABNORMAL LOW (ref 150–400)
RBC: 3.84 MIL/uL — ABNORMAL LOW (ref 3.87–5.11)
RDW: 13.2 % (ref 11.5–15.5)
WBC: 12.1 10*3/uL — ABNORMAL HIGH (ref 4.0–10.5)
nRBC: 0 % (ref 0.0–0.2)

## 2019-01-10 LAB — COMPREHENSIVE METABOLIC PANEL
ALT: 44 U/L (ref 0–44)
AST: 51 U/L — ABNORMAL HIGH (ref 15–41)
Albumin: 2.9 g/dL — ABNORMAL LOW (ref 3.5–5.0)
Alkaline Phosphatase: 38 U/L (ref 38–126)
Anion gap: 11 (ref 5–15)
BUN: 15 mg/dL (ref 8–23)
CO2: 22 mmol/L (ref 22–32)
CREATININE: 0.74 mg/dL (ref 0.44–1.00)
Calcium: 8 mg/dL — ABNORMAL LOW (ref 8.9–10.3)
Chloride: 106 mmol/L (ref 98–111)
GFR calc Af Amer: >60 mL/min (ref >60)
GFR calc non Af Amer: >60 mL/min (ref >60)
Glucose, Bld: 116 mg/dL — ABNORMAL HIGH (ref 70–99)
Potassium: 3.2 mmol/L — ABNORMAL LOW (ref 3.5–5.1)
Sodium: 139 mmol/L (ref 135–145)
Total Bilirubin: 0.8 mg/dL (ref 0.3–1.2)
Total Protein: 5.8 g/dL — ABNORMAL LOW (ref 6.5–8.1)

## 2019-01-10 LAB — LEGIONELLA PNEUMOPHILA SEROGP 1 UR AG: L. pneumophila Serogp 1 Ur Ag: NEGATIVE

## 2019-01-10 LAB — MAGNESIUM: Magnesium: 2.1 mg/dL (ref 1.7–2.4)

## 2019-01-10 MED ORDER — FUROSEMIDE 10 MG/ML IJ SOLN
60.0000 mg | Freq: Once | INTRAMUSCULAR | Status: AC
  Administered 2019-01-10: 60 mg via INTRAVENOUS
  Filled 2019-01-10: qty 6

## 2019-01-10 MED ORDER — DM-GUAIFENESIN ER 30-600 MG PO TB12
1.0000 | ORAL_TABLET | Freq: Two times a day (BID) | ORAL | Status: DC
  Administered 2019-01-10 – 2019-01-11 (×3): 1 via ORAL
  Filled 2019-01-10 (×3): qty 1

## 2019-01-10 NOTE — Plan of Care (Signed)
  Problem: Clinical Measurements: Goal: Ability to maintain clinical measurements within normal limits will improve Outcome: Progressing   Problem: Clinical Measurements: Goal: Respiratory complications will improve Outcome: Progressing   
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
## 2019-01-10 NOTE — Progress Notes (Signed)
Occupational Therapy Treatment Patient Details Name: Hannah Galvan MRN: [id number] DOB: 03/07/1931 Today's Date: 01/10/2019    History of present illness Pt is an 85 yo female s/p cough and confusion x5 days with UTI x2 weeks ago. Pt found to be RSV positive, negative for flu with mild acute encephalopathy. Pmhx: dementia, breast cancer, HLD, B/L TKAs.,   OT comments  Pt currently requires minA-modA for ADL and minA for functional mobility. Pt on 4lnc throughout session, while pt was blowing her nose she began to desaturate, educated on pursed lip breathing, otherwise SpO2 stable throughout session. Due to functional and cognitive limitations listed below, pt will benefit from skilled OT services in subacute rehab to maximize safety and independence with ADL and functional mobility.    Follow Up Recommendations  SNF;Supervision/Assistance - 24 hour    Equipment Recommendations  None recommended by OT    Recommendations for Other Services      Precautions / Restrictions Precautions Precautions: Fall Restrictions Weight Bearing Restrictions: No       Mobility Bed Mobility Overal bed mobility: Needs Assistance Bed Mobility: Rolling;Sidelying to Sit Rolling: Min assist;Mod assist Sidelying to sit: Min guard     Sit to sidelying: Mod assist General bed mobility comments: Pt performed rolling to R side with cues for hand placement.  Assistance for hips.  Pt performed additonal trial of rolling with decreased assistance.  Pt performed sidelying to sit with min guard.  Increased time and effort with max VCs.    Transfers Overall transfer level: Needs assistance Equipment used: Rolling walker (2 wheeled) Transfers: Sit to/from Stand Sit to Stand: Mod assist         General transfer comment: Pt required assistance to boost into standing.  Pt required cues for placement of hands to and from seated surface. very fatigues SpO2 decreased to 83% with exertion     Balance Overall  balance assessment: Needs assistance Sitting-balance support: Single extremity supported;Feet supported Sitting balance-Leahy Scale: Poor Sitting balance - Comments: until feet grounded then seated balance improved   Standing balance support: No upper extremity supported;During functional activity Standing balance-Leahy Scale: Poor Standing balance comment: reliant on external support for anterior support on pelvis to stabilize while in standing                           ADL either performed or assessed with clinical judgement   ADL Overall ADL's : Needs assistance/impaired     Grooming: Minimal assistance;Standing;Set up Grooming Details (indicate cue type and reason): pt completed oral care after setup while standing at sink level;minguard-minA for stability while standing;vc for safe hand placement                  Toilet Transfer: Minimal assistance;BSC Toilet Transfer Details (indicate cue type and reason): pt completed toileting from stand pivot after standing to complete grooming;use of BSC due to decreased activity tolerance  Toileting- Clothing Manipulation and Hygiene: Moderate assistance;Minimal assistance;Sit to/from stand Toileting - Clothing Manipulation Details (indicate cue type and reason): modA for thorough pericare following BM while in bed;pt urinated in [facility] with minA for stability while she completed clothing manip in standing     Functional mobility during ADLs: Minimal assistance;Rolling walker General ADL Comments: decreased activity tolerance, limited with distance for functional mobility;required vc for navigating in room;vc for safe hand placement;slow processing throughout ADL     Vision       Perception  Praxis      Cognition Arousal/Alertness: Awake/alert Behavior During Therapy: Flat affect Overall Cognitive Status: No family/caregiver present to determine baseline cognitive functioning                                  General Comments: Pt only oriented to herself and time.  reports she is in [place] and doesn't know why she would be at the hospital.          Exercises General Exercises - Lower Extremity Ankle Circles/Pumps: AROM;Both;10 reps;Supine Heel Slides: AAROM;Both;10 reps;Supine   Shoulder Instructions       General Comments      Pertinent Vitals/ Pain       Pain Assessment: No/denies pain  Home Living                                          Prior Functioning/Environment              Frequency  Min 2X/week        Progress Toward Goals  OT Goals(current goals can now be found in the care plan section)  Progress towards OT goals: Progressing toward goals  Acute Rehab OT Goals Patient Stated Goal: to go home OT Goal Formulation: With patient Time For Goal Achievement: 01/23/19 Potential to Achieve Goals: Good ADL Goals Pt Will Perform Grooming: with modified independence Pt Will Perform Lower Body Dressing: with supervision Pt Will Transfer to Toilet: with supervision Pt Will Perform Toileting - Clothing Manipulation and hygiene: with supervision  Plan Discharge plan needs to be updated    Co-evaluation    PT/OT/SLP Co-Evaluation/Treatment: Yes Reason for Co-Treatment: For patient/therapist safety;To address functional/ADL transfers PT goals addressed during session: Mobility/safety with mobility;Proper use of DME OT goals addressed during session: ADL's and self-care      AM-PAC OT "6 Clicks" Daily Activity     Outcome Measure   Help from another person eating meals?: None Help from another person taking care of personal grooming?: A Little Help from another person toileting, which includes using toliet, bedpan, or urinal?: A Lot Help from another person bathing (including washing, rinsing, drying)?: A Lot Help from another person to put on and taking off regular upper body clothing?: A Little Help from another person to put on  and taking off regular lower body clothing?: A Lot 6 Click Score: 16    End of Session Equipment Utilized During Treatment: Gait belt;Rolling walker;Oxygen(4lnc)  OT Visit Diagnosis: Unsteadiness on feet (R26.81);Muscle weakness (generalized) (M62.81)   Activity Tolerance Patient limited by fatigue   Patient Left in chair;with call bell/phone within reach;with chair alarm set   Nurse Communication Mobility status        Time: 8242-3536 OT Time Calculation (min): 33 min  Charges: OT General Charges $OT Visit: 1 Visit OT Treatments $Self Care/Home Management : 8-22 mins  Dorinda Hill OTR/L [place] Office: [place] 01/10/2019, 11:54 AM

## 2019-01-10 NOTE — Progress Notes (Signed)
CSW spoke with patient's caregiver, Levada Dy, at [facility]. She reports they are able to accept patient back on a mechanical soft diet and oxygen if it is set up through [employer]. CSW will continue to follow for needs.   Percell Locus Anysa Tacey LCSW [phone]

## 2019-01-10 NOTE — Progress Notes (Signed)
Physical Therapy Treatment Patient Details Name: Hannah Galvan MRN: [id number] DOB: 05-01-1931 Today's Date: 01/10/2019    History of Present Illness Pt is an 85 yo female s/p cough and confusion x5 days with UTI x2 weeks ago. Pt found to be RSV positive, negative for flu with mild acute encephalopathy. Pmhx: dementia, breast cancer, HLD, B/L TKAs.,    PT Comments    Pt performed gait training in room.  She is currently functioning with min to moderate assistance.  Pt fatigues quickly with DOE noted.  SPO2 stable with supplemental O2.  She did remove her O2 to blow nose and began to desaturate.  Pt confused and required re-orientation.  Plan for SNF remains appropriate at d/c.  Plan next session for continued progression of functional mobility and strengthening activities to tolerance.   Follow Up Recommendations  SNF;Supervision/Assistance - 24 hour     Equipment Recommendations  (will need O2.  )    Recommendations for Other Services       Precautions / Restrictions Precautions Precautions: Fall Restrictions Weight Bearing Restrictions: No    Mobility  Bed Mobility Overal bed mobility: Needs Assistance Bed Mobility: Rolling;Sidelying to Sit Rolling: Min assist;Mod assist Sidelying to sit: Min guard       General bed mobility comments: Pt performed rolling to R side with cues for hand placement.  Assistance for hips.  Pt performed additonal trial of rolling with decreased assistance.  Pt performed sidelying to sit with min guard.  Increased time and effort with max VCs.    Transfers Overall transfer level: Needs assistance Equipment used: Rolling walker (2 wheeled) Transfers: Sit to/from Stand Sit to Stand: Mod assist         General transfer comment: Pt required assistance to boost into standing.  Pt required cues for placement of hands to and from seated surface.    Ambulation/Gait Ambulation/Gait assistance: Min assist Gait Distance (Feet): 8 Feet(12  ft.) Assistive device: Rolling walker (2 wheeled) Gait Pattern/deviations: Trunk flexed;Shuffle;Decreased step length - left;Step-through pattern     General Gait Details: Cues for upper trunk control and assistance and cueing for RW position.     Stairs             Wheelchair Mobility    Modified Rankin (Stroke Patients Only)       Balance Overall balance assessment: Needs assistance   Sitting balance-Leahy Scale: Poor Sitting balance - Comments: until feet grounded then seated balance improved     Standing balance-Leahy Scale: Poor                              Cognition Arousal/Alertness: Awake/alert Behavior During Therapy: Flat affect Overall Cognitive Status: No family/caregiver present to determine baseline cognitive functioning                                 General Comments: Pt only oriented to herself and time.  reports she is in [place] and doesn't know why she would be at the hospital.        Exercises General Exercises - Lower Extremity Ankle Circles/Pumps: AROM;Both;10 reps;Supine Heel Slides: AAROM;Both;10 reps;Supine    General Comments        Pertinent Vitals/Pain Pain Assessment: No/denies pain    Home Living  Prior Function            PT Goals (current goals can now be found in the care plan section) Acute Rehab PT Goals Patient Stated Goal: to go home Potential to Achieve Goals: Fair Progress towards PT goals: Progressing toward goals    Frequency    Min 3X/week      PT Plan Current plan remains appropriate    Co-evaluation PT/OT/SLP Co-Evaluation/Treatment: Yes Reason for Co-Treatment: Complexity of the patient's impairments (multi-system involvement);Necessary to address cognition/behavior during functional activity;For patient/therapist safety PT goals addressed during session: Mobility/safety with mobility;Balance;Proper use of DME        AM-PAC PT "6  Clicks" Mobility   Outcome Measure  Help needed turning from your back to your side while in a flat bed without using bedrails?: A Lot Help needed moving from lying on your back to sitting on the side of a flat bed without using bedrails?: A Lot Help needed moving to and from a bed to a chair (including a wheelchair)?: A Lot Help needed standing up from a chair using your arms (e.g., wheelchair or bedside chair)?: A Lot Help needed to walk in hospital room?: A Little Help needed climbing 3-5 steps with a railing? : Total 6 Click Score: 12    End of Session Equipment Utilized During Treatment: Gait belt;Oxygen Activity Tolerance: Patient tolerated treatment well Patient left: in chair;with call bell/phone within reach;with chair alarm set Nurse Communication: Mobility status(informed nursing of red irritation on outer labia.  ) PT Visit Diagnosis: Muscle weakness (generalized) (M62.81);Difficulty in walking, not elsewhere classified (R26.2)     Time: 4944-9675 PT Time Calculation (min) (ACUTE ONLY): 36 min  Charges:  $Gait Training: 8-22 mins                     Governor Rooks, PTA Acute Rehabilitation Services Pager [phone] Office [phone]     Hannah Galvan Eli Hose 01/10/2019, 10:46 AM

## 2019-01-10 NOTE — Progress Notes (Signed)
Patient ID: Hannah Galvan, female   DOB: 06-11-1931, 85 y.o.   MRN: [id number]  PROGRESS NOTE    DALEAH COULSON  NGE:[id number] DOB: Jan 25, 1931 DOA: 01/08/2019 PCP: Susy Frizzle, MD   Brief Narrative:  85 year old female with history of hypothyroidism, hypertension, hyperlipidemia, mild dementia, HOCM, breast cancer status post radiation and lumpectomy presented with shortness of breath from ALF.  She was found to be febrile.  She was started on IV fluids and antibiotics probably for pneumonia.   Assessment & Plan:   Principal Problem:   Sepsis ([place]) Active Problems:   Thyroid disease   Hyperlipidemia   Breast cancer ([place])   Hypothyroidism   Essential hypertension   Alzheimer's dementia without behavioral disturbance ([place])   Acute metabolic encephalopathy   Sepsis present on admission -Respiratory virus panel is positive for RSV. Antibiotic plan as below.  Blood pressure stable.  No temperature spikes over the last 24 hours.  Cultures negative so far.  Acute hypoxic respiratory failure -Probably from RSV bronchitis -Continue oxygen supplementation.  Wean off as able.  -Will DC Rocephin and Zithromax and monitor -Chest x-ray from yesterday showed no infiltrates but findings suggestive of bronchitis.  Will continue prednisone 40 mg daily as well. -Give 1 dose of IV Lasix.  If respiratory status does not improve with this, might need 2D echo.  Leukocytosis -Improving.  Monitor.  Acute metabolic encephalopathy causing mild confusion in a patient with mild Alzheimer's dementia -Monitor mental status.  More awake, slow to respond to questions.  CT head showed no acute abnormality.  Fall precautions.  If mental status does not improve, might need MRI of the brain.  Patient will need outpatient neurology evaluation. -We will get vitamin M01, TSH, folic acid.  Hyperlipidemia next-continue statin  History of breast cancer--status post lumpectomy/radiation.  Outpatient  follow-up  Hypothyroidism -Continue Synthroid  Essential hypertension--continue current antihypertensive regimen.  Monitor blood pressure  Generalized weakness/debility--PT recommends SNF.  Social worker consulted.   DVT prophylaxis: Lovenox Code Status: Full Family Communication: None at bedside.  Spoke to son on phone on 01/09/2019. Disposition Plan: SNF in 1 to 2 days once clinically improved.  Consultants: None Procedures: None  Antimicrobials:  Anti-infectives (From admission, onward)   Start     Dose/Rate Route Frequency Ordered Stop   01/09/19 1500  cefTRIAXone (ROCEPHIN) 1 g in sodium chloride 0.9 % 100 mL IVPB  Status:  Discontinued     1 g 200 mL/hr over 30 Minutes Intravenous Every 24 hours 01/08/19 1625 01/08/19 1644   01/09/19 1400  azithromycin (ZITHROMAX) 500 mg in sodium chloride 0.9 % 250 mL IVPB     500 mg 250 mL/hr over 60 Minutes Intravenous Every 24 hours 01/08/19 1625 01/14/19 1359   01/09/19 1200  cefTRIAXone (ROCEPHIN) 1 g in sodium chloride 0.9 % 100 mL IVPB     1 g 200 mL/hr over 30 Minutes Intravenous Every 24 hours 01/09/19 1158     01/08/19 1445  cefTRIAXone (ROCEPHIN) 2 g in sodium chloride 0.9 % 100 mL IVPB  Status:  Discontinued     2 g 200 mL/hr over 30 Minutes Intravenous Every 24 hours 01/08/19 1441 01/08/19 1755   01/08/19 1445  azithromycin (ZITHROMAX) 500 mg in sodium chloride 0.9 % 250 mL IVPB  Status:  Discontinued     500 mg 250 mL/hr over 60 Minutes Intravenous Every 24 hours 01/08/19 1441 01/08/19 1736          Subjective: Patient seen and examined  at bedside.  She is awake, slow to respond to questions but answering some.  Feels weak and tired.  Feels nervous.  No worsening fevers reported.  No current diarrhea.  Objective: Vitals:   01/10/19 0425 01/10/19 0429 01/10/19 0501 01/10/19 0737  BP: (!) 170/81 (!) 166/84 (!) 157/71   Pulse:   72 68  Resp: 20  19 18   Temp: 98.5 F (36.9 C)  98.6 F (37 C)   TempSrc: Oral      SpO2: 100%  100% 100%  Weight:      Height:        Intake/Output Summary (Last 24 hours) at 01/10/2019 0925 Last data filed at 01/09/2019 1501 Gross per 24 hour  Intake 350 ml  Output -  Net 350 ml   Filed Weights   01/08/19 1743  Weight: 72 kg    Examination:  General exam: Elderly female lying in bed.  No acute distress.  Awake, slow to respond to questions but oriented and answering some questions Respiratory system: Bilateral decreased breath sounds at bases with some scattered crackles, some minimal wheezing Cardiovascular system: Rate controlled, S1-S2 heard Gastrointestinal system: Abdomen is nondistended, soft and nontender. Normal bowel sounds heard. Extremities: No cyanosis, edema   Data Reviewed: I have personally reviewed following labs and imaging studies  CBC: Recent Labs  Lab 01/08/19 1338 01/10/19 0437  WBC 13.3* 12.1*  NEUTROABS 10.7* 10.0*  HGB 13.7 12.1  HCT 43.5 36.4  MCV 96.9 94.8  PLT 145* 960*   Basic Metabolic Panel: Recent Labs  Lab 01/08/19 1338 01/10/19 0437  NA 138 139  K 4.0 3.2*  CL 105 106  CO2 23 22  GLUCOSE 127* 116*  BUN 17 15  CREATININE 0.95 0.74  CALCIUM 9.3 8.0*  MG  --  2.1   GFR: Estimated Creatinine Clearance: 48.9 mL/min (by C-G formula based on SCr of 0.74 mg/dL). Liver Function Tests: Recent Labs  Lab 01/08/19 1338 01/10/19 0437  AST 46* 51*  ALT 35 44  ALKPHOS 47 38  BILITOT 0.9 0.8  PROT 7.6 5.8*  ALBUMIN 4.1 2.9*   No results for input(s): LIPASE, AMYLASE in the last 168 hours. No results for input(s): AMMONIA in the last 168 hours. Coagulation Profile: No results for input(s): INR, PROTIME in the last 168 hours. Cardiac Enzymes: No results for input(s): CKTOTAL, CKMB, CKMBINDEX, TROPONINI in the last 168 hours. BNP (last 3 results) No results for input(s): PROBNP in the last 8760 hours. HbA1C: No results for input(s): HGBA1C in the last 72 hours. CBG: No results for input(s): GLUCAP in the  last 168 hours. Lipid Profile: No results for input(s): CHOL, HDL, LDLCALC, TRIG, CHOLHDL, LDLDIRECT in the last 72 hours. Thyroid Function Tests: No results for input(s): TSH, T4TOTAL, FREET4, T3FREE, THYROIDAB in the last 72 hours. Anemia Panel: No results for input(s): VITAMINB12, FOLATE, FERRITIN, TIBC, IRON, RETICCTPCT in the last 72 hours. Sepsis Labs: Recent Labs  Lab 01/08/19 1338 01/08/19 1842  PROCALCITON  --  0.12  LATICACIDVEN 1.9  --     Recent Results (from the past 240 hour(s))  Blood Culture (routine x 2)     Status: None (Preliminary result)   Collection Time: 01/08/19  2:10 PM  Result Value Ref Range Status   Specimen Description BLOOD LEFT FOREARM  Final   Special Requests   Final    BOTTLES DRAWN AEROBIC AND ANAEROBIC Blood Culture adequate volume   Culture   Final    NO GROWTH <  24 HOURS Performed at [facility], [place] [address] [place], [place] [address]    Report Status PENDING  Incomplete  Blood Culture (routine x 2)     Status: None (Preliminary result)   Collection Time: 01/08/19  2:20 PM  Result Value Ref Range Status   Specimen Description BLOOD RIGHT ANTECUBITAL  Final   Special Requests   Final    BOTTLES DRAWN AEROBIC AND ANAEROBIC Blood Culture results may not be optimal due to an excessive volume of blood received in culture bottles   Culture   Final    NO GROWTH < 24 HOURS Performed at [facility], [place] [address] [place], [place] [address]    Report Status PENDING  Incomplete  Group A Strep by PCR     Status: None   Collection Time: 01/08/19  2:39 PM  Result Value Ref Range Status   Group A Strep by PCR NOT DETECTED NOT DETECTED Final    Comment: Performed at [facility], [place] [address], [place], [place] [address]  Urine culture     Status: None   Collection Time: 01/08/19  2:41 PM  Result Value Ref Range Status   Specimen Description URINE, RANDOM  Final   Special Requests NONE  Final   Culture   Final    NO  GROWTH Performed at [facility], [place] [address] [place], [place] [address]    Report Status 01/09/2019 FINAL  Final  Respiratory Panel by PCR     Status: Abnormal   Collection Time: 01/08/19  4:24 PM  Result Value Ref Range Status   Adenovirus NOT DETECTED NOT DETECTED Final   Coronavirus 229E NOT DETECTED NOT DETECTED Final    Comment: (NOTE) The Coronavirus on the Respiratory Panel, DOES NOT test for the novel  Coronavirus (2019 nCoV)    Coronavirus HKU1 NOT DETECTED NOT DETECTED Final   Coronavirus NL63 NOT DETECTED NOT DETECTED Final   Coronavirus OC43 NOT DETECTED NOT DETECTED Final   Metapneumovirus NOT DETECTED NOT DETECTED Final   Rhinovirus / Enterovirus NOT DETECTED NOT DETECTED Final   Influenza A NOT DETECTED NOT DETECTED Final   Influenza B NOT DETECTED NOT DETECTED Final   Parainfluenza Virus 1 NOT DETECTED NOT DETECTED Final   Parainfluenza Virus 2 NOT DETECTED NOT DETECTED Final   Parainfluenza Virus 3 NOT DETECTED NOT DETECTED Final   Parainfluenza Virus 4 NOT DETECTED NOT DETECTED Final   Respiratory Syncytial Virus DETECTED (A) NOT DETECTED Final    Comment: CRITICAL RESULT CALLED TO, READ BACK BY AND VERIFIED WITH: TAnnette Stable 0123 01/09/2019 T. TYSOR    Bordetella pertussis NOT DETECTED NOT DETECTED Final   Chlamydophila pneumoniae NOT DETECTED NOT DETECTED Final   Mycoplasma pneumoniae NOT DETECTED NOT DETECTED Final    Comment: Performed at [facility], [place]. [address] [place], [place] [address]  C difficile quick scan w PCR reflex     Status: None   Collection Time: 01/09/19  8:51 AM  Result Value Ref Range Status   C Diff antigen NEGATIVE NEGATIVE Final   C Diff toxin NEGATIVE NEGATIVE Final   C Diff interpretation No C. difficile detected.  Final         Radiology Studies: Ct Head Wo Contrast  Result Date: 01/09/2019 CLINICAL DATA:  Confusion and altered level of consciousness. EXAM: CT HEAD WITHOUT CONTRAST TECHNIQUE: Contiguous  axial images were obtained from the base of the skull through the vertex  without intravenous contrast. COMPARISON:  03/01/2018 FINDINGS: Brain: Age related atrophy. Ordinary chronic small-vessel ischemic change of the white matter no sign of acute infarction, mass lesion, hemorrhage, hydrocephalus or extra-axial collection. Vascular: There is atherosclerotic calcification of the major vessels at the base of the brain. Skull: Negative Sinuses/Orbits: Mucosal inflammatory changes of the paranasal sinuses. Orbits negative. Other: None IMPRESSION: No acute brain finding. Age related atrophy and chronic small-vessel ischemic change. Some paranasal sinus inflammatory disease, most pronounced of the left maxillary sinus. Electronically Signed   By: Nelson Chimes M.D.   On: 01/09/2019 12:11   Dg Chest Port 1 View  Result Date: 01/09/2019 CLINICAL DATA:  One-week history of cough. EXAM: PORTABLE CHEST 1 VIEW COMPARISON:  03/01/2018 and earlier. FINDINGS: Cardiac silhouette mildly enlarged, unchanged. Thoracic aorta atherosclerotic, unchanged. Hiatal hernia as noted previously. Hilar and mediastinal contours otherwise unremarkable. Prominent bronchovascular markings diffusely and marked central peribronchial thickening, more so than on the prior examinations. No confluent airspace consolidation. Normal pulmonary vascularity. No visible pleural effusions. IMPRESSION: 1. Moderate to severe changes of acute bronchitis and/or asthma without focal airspace pneumonia. 2. Stable mild cardiomegaly without evidence of pulmonary edema. Electronically Signed   By: Evangeline Dakin M.D.   On: 01/09/2019 09:09        Scheduled Meds: . aspirin EC  81 mg Oral Daily  . enoxaparin (LOVENOX) injection  40 mg Subcutaneous Q24H  . exemestane  25 mg Oral QPC breakfast  . folic acid  1 mg Oral Daily  . ipratropium-albuterol  3 mL Nebulization TID  . levothyroxine  125 mcg Oral QAC breakfast  . LORazepam  1 mg Oral QHS  . losartan   100 mg Oral Daily  . nebivolol  5 mg Oral Daily  . potassium chloride SA  20 mEq Oral Daily  . predniSONE  40 mg Oral Q breakfast  . rosuvastatin  10 mg Oral Daily  . sodium chloride flush  3 mL Intravenous Once   Continuous Infusions: . azithromycin 500 mg (01/09/19 1232)  . cefTRIAXone (ROCEPHIN)  IV 1 g (01/09/19 1231)     LOS: 1 day        Aline August, MD [facility] Pager [phone]  If 7PM-7AM, please contact night-coverage [email or web address] Password [facility] 01/10/2019, 9:25 AM

## 2019-01-10 NOTE — Evaluation (Addendum)
Clinical/Bedside Swallow Evaluation Patient Details  Name: Hannah Galvan MRN: [id number] Date of Birth: 1931/07/31  Today's Date: 01/10/2019 Time: SLP Start Time (ACUTE ONLY): 38 SLP Stop Time (ACUTE ONLY): 1240 SLP Time Calculation (min) (ACUTE ONLY): 20 min  Past Medical History:  Past Medical History:  Diagnosis Date  . Allergy   . Anxiety   . Arthritis   . Blood transfusion without reported diagnosis   . Cancer ([place])    breast, s/p RXT, bilateral  . Cataract   . History of echocardiogram    a. Echo 8/16: Vigorous LVF, EF 66-44%, grade 1 diastolic dysfunction, trivial AI, mild MR, mild RVE, normal RV function, PASP 33 mmHg // b. Echo 1/17 EF 65-70%, normal wall motion, grade 1 diastolic dysfunction, trivial AI, MAC, mild MR // c. Echo 11/26/16: mild LVH, EF 60-65, mild AI, mild MR, mild LAE, mild to mod TR  . History of stress test    Lexiscan Myoview 8/16:  EF 81%, breast attenuation, no ischemia; Low Risk // Myoview 11/26/16: EF 75, apical defect c/w soft tissue attenuation, no ischemia or scar; Low Risk  . HOCM (hypertrophic obstructive cardiomyopathy) ([place])   . Hyperlipidemia   . Hypertension   . Hypothyroidism   . Shortness of breath   . Thyroid disease    Past Surgical History:  Past Surgical History:  Procedure Laterality Date  . ABDOMINAL HYSTERECTOMY  2008  . bladder tact    . BREAST LUMPECTOMY Right 2000  . BREAST LUMPECTOMY Left 2007  . CATARACT EXTRACTION  2011  . JOINT REPLACEMENT    . KNEE SURGERY     right  . ROTATOR CUFF REPAIR     right  . THYROID SURGERY  2002  . TOTAL KNEE ARTHROPLASTY Left 06/02/2013   Dr Rhona Raider  . TOTAL KNEE ARTHROPLASTY Right 06/02/2013   Procedure: RIGHT TOTAL KNEE ARTHROPLASTY;  Surgeon: Hessie Dibble, MD;  Location: [facility];  Service: Orthopedics;  Laterality: Right;  . TOTAL KNEE ARTHROPLASTY Left 08/22/2014   Procedure: TOTAL KNEE ARTHROPLASTY;  Surgeon: Hessie Dibble, MD;  Location: [facility];  Service: Orthopedics;   Laterality: Left;   HPI:  85 year old female admitted 01/09/2019 with cough and confusion. PMH: hypertrophic cardiomyopathy, breast cancer s/p radiation, HTN, HLD, hypothyroidism, Alzheimer's, recent PNA.. CXR = Moderate to severe changes of acute bronchitis and/or asthma without focal airspace pneumonia.   Assessment / Plan / Recommendation Clinical Impression  Pt was seen during lunch, consisting of chicken salad sandwich, soup, tea, applesauce and ice cream. Pt reports taking meds in puree prior to admit. No overt s/s aspiration observed on any consistency, and pt was able to self feed after set up. Pt reports she avoids some meats, as "it messes up the digestion".  This raises concern for esophageal dysmotility.  Pt has had no prior ST intervention, however, given age, cognitive impairment, and recent PNA, SLP will follow briefly to assess diet tolerance and provide education. Safe swallow precautions posted at [facility].     SLP Visit Diagnosis: Dysphagia, unspecified (R13.10)    Aspiration Risk  Mild aspiration risk    Diet Recommendation Dysphagia 3 (Mech soft);Thin liquid   Liquid Administration via: Cup;Straw Medication Administration: Whole meds with puree Supervision: Patient able to self feed;Intermittent supervision to cue for compensatory strategies Compensations: Minimize environmental distractions;Small sips/bites;Slow rate Postural Changes: Seated upright at 90 degrees    Other  Recommendations Oral Care Recommendations: Oral care QID   Follow up Recommendations 24 hour  supervision/assistance      Frequency and Duration min 1 x/week  1 week       Prognosis Prognosis for Safe Diet Advancement: Good Barriers to Reach Goals: Cognitive deficits      Swallow Study   General Date of Onset: 01/09/19 HPI: 85 year old female admitted 01/09/2019 with cough and confusion. PMH: hypertrophic cardiomyopathy, breast cancer s/p radiation, HTN, HLD, hypothyroidism, Alzheimer's,  recent PNA.. CXR = Moderate to severe changes of acute bronchitis and/or asthma without focal airspace pneumonia. Type of Study: Bedside Swallow Evaluation Previous Swallow Assessment: none found Diet Prior to this Study: Dysphagia 3 (soft);Thin liquids Temperature Spikes Noted: No Respiratory Status: Room air History of Recent Intubation: No Behavior/Cognition: Alert;Cooperative;Pleasant mood Oral Cavity Assessment: Within Functional Limits Oral Care Completed by SLP: No Oral Cavity - Dentition: Dentures, bottom;Dentures, top Vision: Functional for self-feeding Self-Feeding Abilities: Able to feed self Patient Positioning: Upright in chair Baseline Vocal Quality: Normal Volitional Cough: Strong Volitional Swallow: Able to elicit    Oral/Motor/Sensory Function Overall Oral Motor/Sensory Function: Within functional limits   Ice Chips Ice chips: Not tested   Thin Liquid Thin Liquid: Within functional limits Presentation: Straw;Self Fed    Nectar Thick Nectar Thick Liquid: Not tested   Honey Thick Honey Thick Liquid: Not tested   Puree Puree: Within functional limits Presentation: Spoon;Self Fed   Solid     Solid: Within functional limits Presentation: [place] Hannah Galvan Hannah Galvan, [place] Speech Language Pathologist [phone]  Shonna Chock 01/10/2019,12:40 PM

## 2019-01-10 NOTE — Clinical Social Work Note (Signed)
Clinical Social Work Assessment  Patient Details  Name: Hannah Galvan MRN: [id number] Date of Birth: February 28, 1931  Date of referral:  01/10/19               Reason for consult:  Facility Placement                Permission sought to share information with:  Facility Sport and exercise psychologist, Family Supports Permission granted to share information::  Yes, Verbal Permission Granted  Name::     [name]  Agency::  [facility]  Relationship::  Son  Contact Information:  [phone]  Housing/Transportation Living arrangements for the past 2 months:  [place] of Information:  Adult Children Patient Interpreter Needed:  None Criminal Activity/Legal Involvement Pertinent to Current Situation/Hospitalization:  No - Comment as needed Significant Relationships:  Adult Children Lives with:  Facility Resident Do you feel safe going back to the place where you live?  Yes Need for family participation in patient care:  Yes (Comment)  Care giving concerns:  CSW received consult for possible SNF placement at time of discharge. CSW spoke with patient's son regarding PT recommendation of SNF placement at time of discharge. Patient's son reported that patient resides at [facility]. He reports that he is unsure if they can manage her needs but he prefers her to return there. CSW will confirm if they can accept her back if she needs oxygen. CSW to continue to follow and assist with discharge planning needs.   Social Worker assessment / plan:  CSW spoke with patient's son concerning possibility of rehab at [facility] before returning to ALF.   Employment status:  Retired Nurse, adult PT Recommendations:  [place] / Referral to community resources:  [place]  Patient/Family's Response to care:  Patient's son reports agreement with discharge back to [facility] if possible.  Patient/Family's Understanding  of and Emotional Response to Diagnosis, Current Treatment, and Prognosis:  Patient/family is realistic regarding therapy needs and expressed being hopeful for return to ALF placement. Patient's son expressed understanding of CSW role and discharge process as well as medical condition. No questions/concerns about plan or treatment.    Emotional Assessment Appearance:  Appears stated age Attitude/Demeanor/Rapport:  Unable to Assess Affect (typically observed):  Unable to Assess Orientation:  Oriented to Self, Oriented to Place, Oriented to Situation Alcohol / Substance use:  Not Applicable Psych involvement (Current and /or in the community):  No (Comment)  Discharge Needs  Concerns to be addressed:  Care Coordination Readmission within the last 30 days:  No Current discharge risk:  None Barriers to Discharge:  Continued Medical Work up   [employer], LCSW 01/10/2019, 1:11 PM

## 2019-01-11 LAB — CBC WITH DIFFERENTIAL/PLATELET
Abs Immature Granulocytes: 0.08 10*3/uL — ABNORMAL HIGH (ref 0.00–0.07)
BASOS ABS: 0 10*3/uL (ref 0.0–0.1)
Basophils Relative: 0 %
Eosinophils Absolute: 0 10*3/uL (ref 0.0–0.5)
Eosinophils Relative: 0 %
HCT: 37.3 % (ref 36.0–46.0)
Hemoglobin: 12.6 g/dL (ref 12.0–15.0)
Immature Granulocytes: 1 %
Lymphocytes Relative: 12 %
Lymphs Abs: 1.4 10*3/uL (ref 0.7–4.0)
MCH: 31.5 pg (ref 26.0–34.0)
MCHC: 33.8 g/dL (ref 30.0–36.0)
MCV: 93.3 fL (ref 80.0–100.0)
Monocytes Absolute: 1 10*3/uL (ref 0.1–1.0)
Monocytes Relative: 9 %
Neutro Abs: 9.4 10*3/uL — ABNORMAL HIGH (ref 1.7–7.7)
Neutrophils Relative %: 78 %
PLATELETS: 189 10*3/uL (ref 150–400)
RBC: 4 MIL/uL (ref 3.87–5.11)
RDW: 12.9 % (ref 11.5–15.5)
WBC: 12 10*3/uL — ABNORMAL HIGH (ref 4.0–10.5)
nRBC: 0 % (ref 0.0–0.2)

## 2019-01-11 LAB — BASIC METABOLIC PANEL
Anion gap: 13 (ref 5–15)
BUN: 21 mg/dL (ref 8–23)
CO2: 25 mmol/L (ref 22–32)
Calcium: 8.6 mg/dL — ABNORMAL LOW (ref 8.9–10.3)
Chloride: 103 mmol/L (ref 98–111)
Creatinine, Ser: 0.95 mg/dL (ref 0.44–1.00)
GFR calc Af Amer: >60 mL/min (ref >60)
GFR calc non Af Amer: 55 mL/min — ABNORMAL LOW (ref >60)
Glucose, Bld: 107 mg/dL — ABNORMAL HIGH (ref 70–99)
Potassium: 3 mmol/L — ABNORMAL LOW (ref 3.5–5.1)
Sodium: 141 mmol/L (ref 135–145)

## 2019-01-11 LAB — TSH: TSH: 1.24 u[IU]/mL (ref 0.350–4.500)

## 2019-01-11 LAB — MAGNESIUM: Magnesium: 2.2 mg/dL (ref 1.7–2.4)

## 2019-01-11 LAB — AMMONIA: Ammonia: 32 umol/L (ref 9–35)

## 2019-01-11 LAB — VITAMIN B12: Vitamin B-12: 1399 pg/mL — ABNORMAL HIGH (ref 180–914)

## 2019-01-11 MED ORDER — POTASSIUM CHLORIDE CRYS ER 20 MEQ PO TBCR
40.0000 meq | EXTENDED_RELEASE_TABLET | ORAL | Status: AC
  Administered 2019-01-11 (×2): 40 meq via ORAL
  Filled 2019-01-11 (×2): qty 2

## 2019-01-11 MED ORDER — DM-GUAIFENESIN ER 30-600 MG PO TB12: 1.0000 | ORAL_TABLET | Freq: Two times a day (BID) | ORAL | 0 refills | Status: DC

## 2019-01-11 MED ORDER — PREDNISONE 20 MG PO TABS: 40.0000 mg | ORAL_TABLET | Freq: Every day | ORAL | 0 refills | Status: AC

## 2019-01-11 MED ORDER — FUROSEMIDE 10 MG/ML IJ SOLN
60.0000 mg | Freq: Once | INTRAMUSCULAR | Status: AC
  Administered 2019-01-11: 60 mg via INTRAVENOUS
  Filled 2019-01-11: qty 6

## 2019-01-11 MED ORDER — ALBUTEROL SULFATE HFA 108 (90 BASE) MCG/ACT IN AERS: 2.0000 | INHALATION_SPRAY | RESPIRATORY_TRACT | 0 refills | Status: AC | PRN

## 2019-01-11 NOTE — Progress Notes (Signed)
[name] to be D/C'd to [facility] per MD order. Report called to [place] at [facility]. VVS, Skin clean, dry and intact without evidence of skin break down, no evidence of skin tears noted.  IV catheter discontinued intact. Site without signs and symptoms of complications. Dressing and pressure applied.  An After Visit Summary was printed and given to the patient.  Patient escorted via [place], and D/C to [facility] via private auto.  Hannah Galvan  01/11/2019 3:15 PM

## 2019-01-11 NOTE — Progress Notes (Signed)
Patient will DC to: [facility] Anticipated DC date: 01/11/19 Family notified: Son, [name] Transport by: [name] by car   Per MD patient ready for DC to [facility]. RN, patient, patient's family, and facility notified of DC. Discharge Summary and FL2 sent to facility. RN to call report prior to discharge [phone]). DNR on chart.  CSW will sign off for now as social work intervention is no longer needed. Please consult [place] again if new needs arise.  Cedric Fishman, LCSW Clinical Social Worker [phone]

## 2019-01-11 NOTE — Care Management Note (Addendum)
Case Management Note  Patient Details  Name: Hannah Galvan MRN: [id number] Date of Birth: 1931/03/27  Subjective/Objective:   Sepsis/ SOB.   Margaretmary Bayley (Son) Deborrah Mabin (Relative)   [place] [phone]        [phone]                            BVA:[facility] PICKARD  Action/Plan: Transition back to ALF/ [facility]. Declined SNF placement.  [facility] to provide skilled therapies if needed @ d/c. NCM awaiting walking pulmonary saturation qualifier to determine oxygen need for home, nursing staff made aware.  Daughter to provide transportation to [facility].  Expected Discharge Date:                  Expected Discharge Plan:  Assisted Living / Rest Home  In-House Referral:  Clinical Social Work  Discharge planning Services  CM Consult  Post Acute Care Choice:  NA Choice offered to:  Patient, Adult Children  DME Arranged:  N/A DME Agency:   N/A  HH Arranged:   PT/OT, will be arranged by ALF/ [facility] HH Agency:     Status of Service:  completed  If discussed at [place] of Stay Meetings, dates discussed:    Additional Comments:  Sharin Mons, RN 01/11/2019, 9:22 AM

## 2019-01-11 NOTE — Progress Notes (Signed)
SATURATION QUALIFICATIONS: (This note is used to comply with regulatory documentation for home oxygen)  Patient Saturations on Room Air at Rest = 95%  Patient Saturations on Room Air while Ambulating = 92%   

## 2019-01-11 NOTE — Progress Notes (Signed)
[facility] hospitalist notified patient has bp 177/90 manual awaiting orders. Arthor Captain LPN

## 2019-01-11 NOTE — Discharge Summary (Signed)
Physician Discharge Summary  Hannah Galvan DTO:[id number] DOB: 1931-04-07 DOA: 01/08/2019  PCP: Susy Frizzle, MD  Admit date: 01/08/2019 Discharge date: 01/11/2019  Admitted From: ALF Disposition:  ALF  Recommendations for Outpatient Follow-up:  1. Follow up with PCP in 1 week 2. Outpatient follow-up with neurology 3. Follow-up in the ED if symptoms worsen or new.    Home Health: Home health PT/OT.  Patient refused SNF Equipment/Devices: Might need oxygen via nasal cannula  Discharge Condition: Stable CODE STATUS: DNR Diet recommendation: Heart Healthy /diet as per SLP recommendations  Brief/Interim Summary: 85 year old female with history of hypothyroidism, hypertension, hyperlipidemia, mild dementia, HOCM, breast cancer status post radiation and lumpectomy presented with shortness of breath from ALF.  She was found to be febrile.  She was started on IV fluids and antibiotics probably for pneumonia.  She was found to have RSV.  Antibiotics were discontinued.  Currently afebrile.  Still requiring oxygen by nasal cannula 2 L/min.  Might need home oxygen on discharge.  Will discharge back to ALF on prednisone daily for 5 days.  Patient/family members refused SNF placement.  Discharge Diagnoses:  Principal Problem:   Sepsis ([place]) Active Problems:   Thyroid disease   Hyperlipidemia   Breast cancer ([place])   Hypothyroidism   Essential hypertension   Alzheimer's dementia without behavioral disturbance ([place])   Acute metabolic encephalopathy  Sepsis present on admission -Respiratory virus panel is positive for RSV.  -Treated with broad-spectrum antibiotics initially as she was spiking temperatures. -Antibiotics have been discontinued.  Sepsis has resolved.  Orthostatic so far   acute hypoxic respiratory failure -Probably from RSV bronchitis -Initially required oxygen via nasal cannula at 4 L/min.  Currently at 2 L/min.  Wean off if possible.  Patient might need oxygen  supplementation on discharge if she continues to require oxygen on ambulation.   -There was a concern for pneumonia initially and patient was started on Rocephin and Zithromax.  Antibiotics were subsequently discontinued after respiratory virus panel was positive for RSV.  Chest x-ray was negative for infiltrates.   -Patient was given 1 dose of intravenous Lasix yesterday.  She will be given 1 more dose of intravenous Lasix today.   -Started on oral prednisone 40 mg daily and she will be discharged on the same for 5 more days.    Leukocytosis -Stable.  Acute metabolic encephalopathy causing mild confusion in a patient with mild Alzheimer's dementia -Monitor mental status.    More awake today.  CT head showed no acute abnormality.  Fall precautions.  -Outpatient follow-up with neurology  Hyperlipidemia next-continue statin  History of breast cancer--status post lumpectomy/radiation.  Outpatient follow-up  Hypothyroidism -Continue Synthroid  Essential hypertension--continue current antihypertensive regimen.  Monitor blood pressure  Generalized weakness/debility--PT recommends SNF.  Patient and family members refused SNF placement.  She will be discharged back to ALF with PT/OT.  Discharge Instructions  Discharge Instructions    Ambulatory referral to Neurology   Complete by:  As directed    An appointment is requested in approximately: 2 weeks. AMS/dementia   Call MD for:  difficulty breathing, headache or visual disturbances   Complete by:  As directed    Call MD for:  extreme fatigue   Complete by:  As directed    Call MD for:  hives   Complete by:  As directed    Call MD for:  persistant dizziness or light-headedness   Complete by:  As directed    Call MD for:  persistant nausea  and vomiting   Complete by:  As directed    Call MD for:  severe uncontrolled pain   Complete by:  As directed    Call MD for:  temperature >100.4   Complete by:  As directed    Diet - low  sodium heart healthy   Complete by:  As directed    Face-to-face encounter (required for Medicare/Medicaid patients)   Complete by:  As directed    I Brynleigh Sequeira certify that this patient is under my care and that I, or a nurse practitioner or physician's assistant working with me, had a face-to-face encounter that meets the physician face-to-face encounter requirements with this patient on 01/11/2019. The encounter with the patient was in whole, or in part for the following medical condition(s) which is the primary reason for home health care (List medical condition): Hypoxia/weakness   The encounter with the patient was in whole, or in part, for the following medical condition, which is the primary reason for home health care:  Hypoxia/weakness   I certify that, based on my findings, the following services are medically necessary home health services:  Physical therapy   Reason for Medically Necessary Home Health Services:  Therapy- Therapeutic Exercises to Increase Strength and Endurance   My clinical findings support the need for the above services:  Shortness of breath with activity   Further, I certify that my clinical findings support that this patient is homebound due to:  Shortness of Breath with activity   Home Health   Complete by:  As directed    To provide the following care/treatments:   PT OT     Increase activity slowly   Complete by:  As directed      Allergies as of 01/11/2019      Reactions   Ambien [zolpidem]    confusion   Pyridium [phenazopyridine Hcl] Other (See Comments)   hemolysis   Sulfa Antibiotics Rash      Medication List    TAKE these medications   acetaminophen 325 MG tablet Commonly known as:  TYLENOL TAKE 1 TAB BY MOUTH EVERY 6 HOURS AS NEEDED FOR MILD PAIN What changed:  See the new instructions.   albuterol 108 (90 Base) MCG/ACT inhaler Commonly known as:  PROVENTIL HFA;VENTOLIN HFA Inhale 2 puffs into the lungs every 4 (four) hours as needed  for wheezing or shortness of breath.   amLODipine 5 MG tablet Commonly known as:  NORVASC TAKE 1 TABLET BY MOUTH EVERY DAY   aspirin 81 MG tablet Take 81 mg by mouth daily.   CENTRUM SILVER ADULT 50+ PO Take 1 tablet by mouth daily.   cholecalciferol 1000 units tablet Commonly known as:  VITAMIN D Take 1,000 Units by mouth daily.   Cranberry 500 MG Tabs Take 500 mg by mouth daily.   dextromethorphan-guaiFENesin 30-600 MG 12hr tablet Commonly known as:  MUCINEX DM Take 1 tablet by mouth 2 (two) times daily.   donepezil 5 MG tablet Commonly known as:  ARICEPT TAKE 1 TABLET (5 MG TOTAL) BY MOUTH AT BEDTIME.   exemestane 25 MG tablet Commonly known as:  AROMASIN TAKE 1 TAB BY MOUTH EVERY DAY What changed:  See the new instructions.   folic acid 1 MG tablet Commonly known as:  FOLVITE TAKE 1 TAB BY MOUTH EVERY DAY What changed:  See the new instructions.   GNP CALCIUM 1200 1200-1000 MG-UNIT Chew 1 tab PO QAM What changed:    how much to take  how  to take this  additional instructions   hydrochlorothiazide 25 MG tablet Commonly known as:  HYDRODIURIL Take 1 tablet (25 mg total) by mouth daily.   KLOR-CON M20 20 MEQ tablet Generic drug:  potassium chloride SA TAKE 1 TABLET BY MOUTH DAILY What changed:    how much to take  when to take this   levothyroxine 125 MCG tablet Commonly known as:  SYNTHROID, LEVOTHROID TAKE 1 TAB BY MOUTH EVERY DAY What changed:    how much to take  how to take this  when to take this   LORazepam 1 MG tablet Commonly known as:  ATIVAN 1 tab po qhs What changed:    how much to take  how to take this  when to take this  additional instructions   losartan 100 MG tablet Commonly known as:  COZAAR TAKE 1 TAB BY MOUTH EVERY DAY FOR HTN What changed:  See the new instructions.   nebivolol 5 MG tablet Commonly known as:  BYSTOLIC TAKE 1 TAB BY MOUTH EVERY DAY FOR HTN What changed:    how much to take  how to  take this  when to take this  additional instructions   nitroGLYCERIN 0.4 MG SL tablet Commonly known as:  NITROSTAT Place 1 tablet (0.4 mg total) under the tongue every 5 (five) minutes as needed for chest pain.   predniSONE 20 MG tablet Commonly known as:  DELTASONE Take 2 tablets (40 mg total) by mouth daily with breakfast for 5 days. Start taking on:  January 12, 2019   rosuvastatin 10 MG tablet Commonly known as:  CRESTOR TAKE 1 TABLET BY MOUTH EVERY DAY AT 6PM What changed:  See the new instructions.   sertraline 100 MG tablet Commonly known as:  ZOLOFT Take 1 tablet (100 mg total) by mouth daily.   terbinafine 250 MG tablet Commonly known as:  LAMISIL Take 250 mg by mouth daily.   triamcinolone cream 0.1 % Commonly known as:  KENALOG Apply 1 application topically 2 (two) times daily.      Contact information for after-discharge care    [facility] .   Service:  Assisted Living Contact information: [phone] [address] [address] [phone]             Allergies  Allergen Reactions  . Ambien [Zolpidem]     confusion  . Pyridium [Phenazopyridine Hcl] Other (See Comments)    hemolysis  . Sulfa Antibiotics Rash    Consultations:  None   Procedures/Studies: Ct Head Wo Contrast  Result Date: 01/09/2019 CLINICAL DATA:  Confusion and altered level of consciousness. EXAM: CT HEAD WITHOUT CONTRAST TECHNIQUE: Contiguous axial images were obtained from the base of the skull through the vertex without intravenous contrast. COMPARISON:  03/01/2018 FINDINGS: Brain: Age related atrophy. Ordinary chronic small-vessel ischemic change of the white matter no sign of acute infarction, mass lesion, hemorrhage, hydrocephalus or extra-axial collection. Vascular: There is atherosclerotic calcification of the major vessels at the base of the brain. Skull: Negative Sinuses/Orbits: Mucosal inflammatory  changes of the paranasal sinuses. Orbits negative. Other: None IMPRESSION: No acute brain finding. Age related atrophy and chronic small-vessel ischemic change. Some paranasal sinus inflammatory disease, most pronounced of the left maxillary sinus. Electronically Signed   By: Nelson Chimes M.D.   On: 01/09/2019 12:11   Dg Chest Port 1 View  Result Date: 01/09/2019 CLINICAL DATA:  One-week history of cough. EXAM: PORTABLE CHEST  1 VIEW COMPARISON:  03/01/2018 and earlier. FINDINGS: Cardiac silhouette mildly enlarged, unchanged. Thoracic aorta atherosclerotic, unchanged. Hiatal hernia as noted previously. Hilar and mediastinal contours otherwise unremarkable. Prominent bronchovascular markings diffusely and marked central peribronchial thickening, more so than on the prior examinations. No confluent airspace consolidation. Normal pulmonary vascularity. No visible pleural effusions. IMPRESSION: 1. Moderate to severe changes of acute bronchitis and/or asthma without focal airspace pneumonia. 2. Stable mild cardiomegaly without evidence of pulmonary edema. Electronically Signed   By: Evangeline Dakin M.D.   On: 01/09/2019 09:09      Subjective: Patient seen and examined at bedside.  She is more awake this morning and feels a little better.  She is slow to respond to questions.  She denies any worsening shortness of breath, fever or vomiting.  Discharge Exam: Vitals:   01/11/19 0414 01/11/19 0723  BP: (!) 177/90   Pulse:    Resp:    Temp:    SpO2:  100%   Vitals:   01/10/19 2116 01/11/19 0357 01/11/19 0414 01/11/19 0723  BP: (!) 148/71 (!) 170/83 (!) 177/90   Pulse: 92 80    Resp:  16    Temp: (!) 97.5 F (36.4 C) 97.9 F (36.6 C)    TempSrc: Oral Oral    SpO2: 92% 94%  100%  Weight:      Height:        General: Pt is more awake, slow to respond to questions.  Not a good historian.  No distress  cardiovascular: rate controlled, S1/S2 + Respiratory: bilateral decreased breath sounds at  bases Abdominal: Soft, NT, ND, bowel sounds + Extremities: Trace edema, no cyanosis    The results of significant diagnostics from this hospitalization (including imaging, microbiology, ancillary and laboratory) are listed below for reference.     Microbiology: Recent Results (from the past 240 hour(s))  Blood Culture (routine x 2)     Status: None (Preliminary result)   Collection Time: 01/08/19  2:10 PM  Result Value Ref Range Status   Specimen Description BLOOD LEFT FOREARM  Final   Special Requests   Final    BOTTLES DRAWN AEROBIC AND ANAEROBIC Blood Culture adequate volume   Culture   Final    NO GROWTH 3 DAYS Performed at [facility], [address], [place], [place] [address]    Report Status PENDING  Incomplete  Blood Culture (routine x 2)     Status: None (Preliminary result)   Collection Time: 01/08/19  2:20 PM  Result Value Ref Range Status   Specimen Description BLOOD RIGHT ANTECUBITAL  Final   Special Requests   Final    BOTTLES DRAWN AEROBIC AND ANAEROBIC Blood Culture results may not be optimal due to an excessive volume of blood received in culture bottles   Culture   Final    NO GROWTH 3 DAYS Performed at [facility], [place] [address], [place], [place] [address]    Report Status PENDING  Incomplete  Group A Strep by PCR     Status: None   Collection Time: 01/08/19  2:39 PM  Result Value Ref Range Status   Group A Strep by PCR NOT DETECTED NOT DETECTED Final    Comment: Performed at [facility], [place] [address], [place], [place] [address]  Urine culture     Status: None   Collection Time: 01/08/19  2:41 PM  Result Value Ref Range Status   Specimen Description URINE, RANDOM  Final  Special Requests NONE  Final   Culture   Final    NO GROWTH Performed at [facility], [place] [address] [place], [place] [address]    Report Status 01/09/2019 FINAL  Final  Respiratory Panel by PCR     Status: Abnormal   Collection Time: 01/08/19  4:24  PM  Result Value Ref Range Status   Adenovirus NOT DETECTED NOT DETECTED Final   Coronavirus 229E NOT DETECTED NOT DETECTED Final    Comment: (NOTE) The Coronavirus on the Respiratory Panel, DOES NOT test for the novel  Coronavirus (2019 nCoV)    Coronavirus HKU1 NOT DETECTED NOT DETECTED Final   Coronavirus NL63 NOT DETECTED NOT DETECTED Final   Coronavirus OC43 NOT DETECTED NOT DETECTED Final   Metapneumovirus NOT DETECTED NOT DETECTED Final   Rhinovirus / Enterovirus NOT DETECTED NOT DETECTED Final   Influenza A NOT DETECTED NOT DETECTED Final   Influenza B NOT DETECTED NOT DETECTED Final   Parainfluenza Virus 1 NOT DETECTED NOT DETECTED Final   Parainfluenza Virus 2 NOT DETECTED NOT DETECTED Final   Parainfluenza Virus 3 NOT DETECTED NOT DETECTED Final   Parainfluenza Virus 4 NOT DETECTED NOT DETECTED Final   Respiratory Syncytial Virus DETECTED (A) NOT DETECTED Final    Comment: CRITICAL RESULT CALLED TO, READ BACK BY AND VERIFIED WITH: TAnnette Stable 0123 01/09/2019 T. TYSOR    Bordetella pertussis NOT DETECTED NOT DETECTED Final   Chlamydophila pneumoniae NOT DETECTED NOT DETECTED Final   Mycoplasma pneumoniae NOT DETECTED NOT DETECTED Final    Comment: Performed at [facility], [place]. [address], [place], [place] [address]  C difficile quick scan w PCR reflex     Status: None   Collection Time: 01/09/19  8:51 AM  Result Value Ref Range Status   C Diff antigen NEGATIVE NEGATIVE Final   C Diff toxin NEGATIVE NEGATIVE Final   C Diff interpretation No C. difficile detected.  Final     Labs: BNP (last 3 results) Recent Labs    03/01/18 1822  BNP 76.1   Basic Metabolic Panel: Recent Labs  Lab 01/08/19 1338 01/10/19 0437 01/11/19 0411  NA 138 139 141  K 4.0 3.2* 3.0*  CL 105 106 103  CO2 23 22 25   GLUCOSE 127* 116* 107*  BUN 17 15 21   CREATININE 0.95 0.74 0.95  CALCIUM 9.3 8.0* 8.6*  MG  --  2.1 2.2   Liver Function Tests: Recent Labs  Lab 01/08/19 1338  01/10/19 0437  AST 46* 51*  ALT 35 44  ALKPHOS 47 38  BILITOT 0.9 0.8  PROT 7.6 5.8*  ALBUMIN 4.1 2.9*   No results for input(s): LIPASE, AMYLASE in the last 168 hours. Recent Labs  Lab 01/11/19 0411  AMMONIA 32   CBC: Recent Labs  Lab 01/08/19 1338 01/10/19 0437 01/11/19 0411  WBC 13.3* 12.1* 12.0*  NEUTROABS 10.7* 10.0* 9.4*  HGB 13.7 12.1 12.6  HCT 43.5 36.4 37.3  MCV 96.9 94.8 93.3  PLT 145* 147* 189   Cardiac Enzymes: No results for input(s): CKTOTAL, CKMB, CKMBINDEX, TROPONINI in the last 168 hours. BNP: Invalid input(s): POCBNP CBG: No results for input(s): GLUCAP in the last 168 hours. D-Dimer No results for input(s): DDIMER in the last 72 hours. Hgb A1c No results for input(s): HGBA1C in the last 72 hours. Lipid Profile No results for input(s): CHOL, HDL, LDLCALC, TRIG, CHOLHDL, LDLDIRECT in the last 72 hours. Thyroid function studies Recent Labs  01/11/19 0411  TSH 1.240   Anemia work up Recent Labs    01/11/19 0411  VITAMINB12 1,399*   Urinalysis    Component Value Date/Time   COLORURINE YELLOW 01/08/2019 1400   APPEARANCEUR CLOUDY (A) 01/08/2019 1400   LABSPEC 1.024 01/08/2019 1400   PHURINE 7.0 01/08/2019 1400   GLUCOSEU 50 (A) 01/08/2019 1400   HGBUR NEGATIVE 01/08/2019 1400   BILIRUBINUR NEGATIVE 01/08/2019 1400   BILIRUBINUR neg 08/31/2012 1142   KETONESUR 20 (A) 01/08/2019 1400   PROTEINUR 100 (A) 01/08/2019 1400   UROBILINOGEN 0.2 10/02/2015 2102   NITRITE NEGATIVE 01/08/2019 1400   LEUKOCYTESUR NEGATIVE 01/08/2019 1400   Sepsis Labs Invalid input(s): PROCALCITONIN,  WBC,  LACTICIDVEN Microbiology Recent Results (from the past 240 hour(s))  Blood Culture (routine x 2)     Status: None (Preliminary result)   Collection Time: 01/08/19  2:10 PM  Result Value Ref Range Status   Specimen Description BLOOD LEFT FOREARM  Final   Special Requests   Final    BOTTLES DRAWN AEROBIC AND ANAEROBIC Blood Culture adequate volume    Culture   Final    NO GROWTH 3 DAYS Performed at [facility], [place] [address], [place], [place] [address]    Report Status PENDING  Incomplete  Blood Culture (routine x 2)     Status: None (Preliminary result)   Collection Time: 01/08/19  2:20 PM  Result Value Ref Range Status   Specimen Description BLOOD RIGHT ANTECUBITAL  Final   Special Requests   Final    BOTTLES DRAWN AEROBIC AND ANAEROBIC Blood Culture results may not be optimal due to an excessive volume of blood received in culture bottles   Culture   Final    NO GROWTH 3 DAYS Performed at [facility], [place] [address] [place], [place] [address]    Report Status PENDING  Incomplete  Group A Strep by PCR     Status: None   Collection Time: 01/08/19  2:39 PM  Result Value Ref Range Status   Group A Strep by PCR NOT DETECTED NOT DETECTED Final    Comment: Performed at [facility], [place] [address] [place], [place] [address]  Urine culture     Status: None   Collection Time: 01/08/19  2:41 PM  Result Value Ref Range Status   Specimen Description URINE, RANDOM  Final   Special Requests NONE  Final   Culture   Final    NO GROWTH Performed at [facility], [place] [address] [place], [place] [address]    Report Status 01/09/2019 FINAL  Final  Respiratory Panel by PCR     Status: Abnormal   Collection Time: 01/08/19  4:24 PM  Result Value Ref Range Status   Adenovirus NOT DETECTED NOT DETECTED Final   Coronavirus 229E NOT DETECTED NOT DETECTED Final    Comment: (NOTE) The Coronavirus on the Respiratory Panel, DOES NOT test for the novel  Coronavirus (2019 nCoV)    Coronavirus HKU1 NOT DETECTED NOT DETECTED Final   Coronavirus NL63 NOT DETECTED NOT DETECTED Final   Coronavirus OC43 NOT DETECTED NOT DETECTED Final   Metapneumovirus NOT DETECTED NOT DETECTED Final   Rhinovirus / Enterovirus NOT DETECTED NOT DETECTED Final   Influenza A NOT DETECTED NOT DETECTED Final   Influenza B NOT DETECTED NOT  DETECTED Final   Parainfluenza Virus 1 NOT DETECTED NOT DETECTED Final   Parainfluenza Virus 2 NOT DETECTED NOT DETECTED Final  Parainfluenza Virus 3 NOT DETECTED NOT DETECTED Final   Parainfluenza Virus 4 NOT DETECTED NOT DETECTED Final   Respiratory Syncytial Virus DETECTED (A) NOT DETECTED Final    Comment: CRITICAL RESULT CALLED TO, READ BACK BY AND VERIFIED WITH: TAnnette Stable 0123 01/09/2019 T. TYSOR    Bordetella pertussis NOT DETECTED NOT DETECTED Final   Chlamydophila pneumoniae NOT DETECTED NOT DETECTED Final   Mycoplasma pneumoniae NOT DETECTED NOT DETECTED Final    Comment: Performed at [facility], [place] [address], [place], [place] [address]  C difficile quick scan w PCR reflex     Status: None   Collection Time: 01/09/19  8:51 AM  Result Value Ref Range Status   C Diff antigen NEGATIVE NEGATIVE Final   C Diff toxin NEGATIVE NEGATIVE Final   C Diff interpretation No C. difficile detected.  Final     Time coordinating discharge: 35 minutes  SIGNED:   Aline August, MD  [facility] 01/11/2019, 11:15 AM Pager: [phone]  If 7PM-7AM, please contact night-coverage [email or web address] Password [email or web address]

## 2019-01-11 NOTE — [place] FL2 (Signed)
[place] MEDICAID FL2 LEVEL OF CARE SCREENING TOOL     IDENTIFICATION  Patient Name: Hannah Galvan Birthdate: 02-12-1931 Sex: female Admission Date (Current Location): 01/08/2019  [facility] and [place] Number:  Herbalist and Address:  The . [facility], [place] [address] [place], [place] [address]      Provider Number: [id number]  Attending Physician Name and Address:  Aline August, MD  Relative Name and Phone Number:  [name], son, [phone]    Current Level of Care: Hospital Recommended Level of Care: [place] Prior Approval Number:    Date Approved/Denied:   PASRR Number:    Discharge Plan: Other (Comment)(ALF)    Current Diagnoses: Patient Active Problem List   Diagnosis Date Noted  . Sepsis ([place]) 01/08/2019  . Acute metabolic encephalopathy 84/13/2440  . Fall at home, initial encounter 03/02/2018  . Head contusion 03/02/2018  . HOCM (hypertrophic obstructive cardiomyopathy) ([place])   . Alzheimer's dementia without behavioral disturbance ([place]) 01/27/2017  . Chest pain 11/17/2015  . Dyspnea 12/06/2014  . Anxiety and depression 09/03/2014  . CN (constipation) 09/03/2014  . Essential hypertension 08/28/2014  . Status post left knee replacement 08/28/2014  . Acute delirium 06/05/2013  . Hyponatremia 06/05/2013  . Acute urinary retention 06/05/2013  . Hypokalemia 06/05/2013  . Fever, unspecified 06/05/2013  . Hypothyroidism 06/05/2013  . Left knee DJD 06/02/2013    Class: Chronic  . UTI (lower urinary tract infection) 08/31/2012  . Heart murmur, aortic 07/15/2012  . Dizziness - giddy 07/15/2012  . Gait disturbance 12/21/2011  . Osteopenia 12/21/2011  . Cholelithiasis with cholecystitis without obstruction 12/21/2011  . Liver hemangioma 12/21/2011  . Pulmonary nodule, right 12/21/2011  . Breast cancer ([place]) 12/03/2011  . Edema of both legs 09/15/2011  . Thyroid disease   . Hyperlipidemia   . Arthritis   .  Cancer ([place])     Orientation RESPIRATION BLADDER Height & Weight     Self, Time, Place  Normal Continent Weight: 72 kg Height:  5\' 3"  (160 cm)  BEHAVIORAL SYMPTOMS/MOOD NEUROLOGICAL BOWEL NUTRITION STATUS      Continent Diet(mechanical soft)  AMBULATORY STATUS COMMUNICATION OF NEEDS Skin   Limited Assist Verbally Normal                       Personal Care Assistance Level of Assistance  Bathing, Feeding, Dressing Bathing Assistance: Limited assistance Feeding assistance: Limited assistance Dressing Assistance: Limited assistance     Functional Limitations Info  Sight, Hearing, Speech Sight Info: Adequate Hearing Info: Impaired Speech Info: Adequate    SPECIAL CARE FACTORS FREQUENCY  PT (By licensed PT), OT (By licensed OT)     PT Frequency: HH PT 4x/week OT Frequency: HH OT 3x/week            Contractures Contractures Info: Not present    Additional Factors Info  Code Status, Allergies, Psychotropic Code Status Info: Full Allergies Info: Ambien Zolpidem, Pyridium Phenazopyridine Hcl, Sulfa Antibiotics Psychotropic Info: Ativan         Current Medications (01/11/2019):   Discharge Medications: TAKE these medications   acetaminophen 325 MG tablet Commonly known as:  TYLENOL TAKE 1 TAB BY MOUTH EVERY 6 HOURS AS NEEDED FOR MILD PAIN What changed:  See the new instructions.   albuterol 108 (90 Base) MCG/ACT inhaler Commonly known as:  PROVENTIL HFA;VENTOLIN HFA Inhale 2 puffs into the lungs every 4 (four) hours as needed for wheezing or shortness of breath.  amLODipine 5 MG tablet Commonly known as:  NORVASC TAKE 1 TABLET BY MOUTH EVERY DAY   aspirin 81 MG tablet Take 81 mg by mouth daily.   CENTRUM SILVER ADULT 50+ PO Take 1 tablet by mouth daily.   cholecalciferol 1000 units tablet Commonly known as:  VITAMIN D Take 1,000 Units by mouth daily.   Cranberry 500 MG Tabs Take 500 mg by mouth daily.   dextromethorphan-guaiFENesin  30-600 MG 12hr tablet Commonly known as:  MUCINEX DM Take 1 tablet by mouth 2 (two) times daily.   donepezil 5 MG tablet Commonly known as:  ARICEPT TAKE 1 TABLET (5 MG TOTAL) BY MOUTH AT BEDTIME.   exemestane 25 MG tablet Commonly known as:  AROMASIN TAKE 1 TAB BY MOUTH EVERY DAY What changed:  See the new instructions.   folic acid 1 MG tablet Commonly known as:  FOLVITE TAKE 1 TAB BY MOUTH EVERY DAY What changed:  See the new instructions.   GNP CALCIUM 1200 1200-1000 MG-UNIT Chew 1 tab PO QAM What changed:    how much to take  how to take this  additional instructions   hydrochlorothiazide 25 MG tablet Commonly known as:  HYDRODIURIL Take 1 tablet (25 mg total) by mouth daily.   KLOR-CON M20 20 MEQ tablet Generic drug:  potassium chloride SA TAKE 1 TABLET BY MOUTH DAILY What changed:    how much to take  when to take this   levothyroxine 125 MCG tablet Commonly known as:  SYNTHROID, LEVOTHROID TAKE 1 TAB BY MOUTH EVERY DAY What changed:    how much to take  how to take this  when to take this   LORazepam 1 MG tablet Commonly known as:  ATIVAN 1 tab po qhs What changed:    how much to take  how to take this  when to take this  additional instructions   losartan 100 MG tablet Commonly known as:  COZAAR TAKE 1 TAB BY MOUTH EVERY DAY FOR HTN What changed:  See the new instructions.   nebivolol 5 MG tablet Commonly known as:  BYSTOLIC TAKE 1 TAB BY MOUTH EVERY DAY FOR HTN What changed:    how much to take  how to take this  when to take this  additional instructions   nitroGLYCERIN 0.4 MG SL tablet Commonly known as:  NITROSTAT Place 1 tablet (0.4 mg total) under the tongue every 5 (five) minutes as needed for chest pain.   predniSONE 20 MG tablet Commonly known as:  DELTASONE Take 2 tablets (40 mg total) by mouth daily with breakfast for 5 days. Start taking on:  January 12, 2019   rosuvastatin 10 MG  tablet Commonly known as:  CRESTOR TAKE 1 TABLET BY MOUTH EVERY DAY AT 6PM What changed:  See the new instructions.   sertraline 100 MG tablet Commonly known as:  ZOLOFT Take 1 tablet (100 mg total) by mouth daily.   terbinafine 250 MG tablet Commonly known as:  LAMISIL Take 250 mg by mouth daily.   triamcinolone cream 0.1 % Commonly known as:  KENALOG Apply 1 application topically 2 (two) times daily.      Relevant Imaging Results:  Relevant Lab Results:   Additional Information SSN   [id number]  Benard Halsted, LCSW

## 2019-01-12 ENCOUNTER — Telehealth: Payer: Self-pay | Admitting: Family Medicine

## 2019-01-12 NOTE — Telephone Encounter (Signed)
pts son called concerned about mother not being able to be walk with her walker since she has been released from the hospital yesterday she cant really move around as she use to. She is really weak.   He wants to know if we can send [facility] prescription for her to get a temporary wheel chair so that way she can still get around if she needs to. Please call him back at your earliest convenience.

## 2019-01-13 LAB — CULTURE, BLOOD (ROUTINE X 2)
Culture: NO GROWTH
Culture: NO GROWTH
SPECIAL REQUESTS: ADEQUATE

## 2019-01-13 LAB — FOLATE RBC
Folate, Hemolysate: 585 ng/mL
Folate, RBC: UNDETERMINED ng/mL

## 2019-01-13 NOTE — Telephone Encounter (Signed)
Order faxed and [name] aware

## 2019-01-13 NOTE — Telephone Encounter (Signed)
Yes, please send them rx for wheelchair.  Its on my desk.

## 2019-01-18 ENCOUNTER — Ambulatory Visit
Admission: RE | Admit: 2019-01-18 | Discharge: 2019-01-18 | Disposition: A | Discharge status: Home / Self Care | Payer: Medicare Other | Source: Ambulatory Visit | Attending: Family Medicine | Admitting: Family Medicine

## 2019-01-18 ENCOUNTER — Ambulatory Visit (INDEPENDENT_AMBULATORY_CARE_PROVIDER_SITE_OTHER): Payer: Medicare Other | Admitting: Family Medicine

## 2019-01-18 ENCOUNTER — Encounter: Payer: Self-pay | Admitting: Family Medicine

## 2019-01-18 VITALS — BP 150/78 | HR 74 | Temp 97.5°F | Resp 14 | Ht 62.5 in | Wt 150.0 lb

## 2019-01-18 DIAGNOSIS — R410 Disorientation, unspecified: Secondary | ICD-10-CM | POA: Diagnosis not present

## 2019-01-18 MED ORDER — LEVOFLOXACIN 500 MG PO TABS: 500.0000 mg | ORAL_TABLET | Freq: Every day | ORAL | 0 refills | Status: DC

## 2019-01-18 NOTE — Progress Notes (Signed)
Subjective:    Patient ID: Hannah Galvan, female    DOB: [birth year], 85 y.o.   MRN: [id number]  HPI  Recently admitted to the hospital.  I have copied relevant portions of the dc summary below for reference: Admit date: 01/08/2019 Discharge date: 01/11/2019  Brief/Interim Summary: 85 year old female with history of hypothyroidism, hypertension, hyperlipidemia, mild dementia, HOCM, breast cancer status post radiation and lumpectomy presented with shortness of breath from ALF. She was found to be febrile. She was started on IV fluids and antibiotics probably for pneumonia.  She was found to have RSV.  Antibiotics were discontinued.  Currently afebrile.  Still requiring oxygen by nasal cannula 2 L/min.  Might need home oxygen on discharge.  Will discharge back to ALF on prednisone daily for 5 days.  Patient/family members refused SNF placement.  Discharge Diagnoses:  Principal Problem:   Sepsis ([place]) Active Problems:   Thyroid disease   Hyperlipidemia   Breast cancer ([place])   Hypothyroidism   Essential hypertension   Alzheimer's dementia without behavioral disturbance ([place])   Acute metabolic encephalopathy  Sepsis present on admission -Respiratory virus panel is positive for RSV.  -Treated with broad-spectrum antibiotics initially as she was spiking temperatures. -Antibiotics have been discontinued.  Sepsis has resolved.  Orthostatic so far   acute hypoxic respiratory failure -Probably from RSV bronchitis -Initially required oxygen via nasal cannula at 4 L/min.  Currently at 2 L/min.  Wean off if possible.  Patient might need oxygen supplementation on discharge if she continues to require oxygen on ambulation.   -There was a concern for pneumonia initially and patient was started on Rocephin and Zithromax.  Antibiotics were subsequently discontinued after respiratory virus panel was positive for RSV.  Chest x-ray was negative for infiltrates.   -Patient was given 1 dose of  intravenous Lasix yesterday.  She will be given 1 more dose of intravenous Lasix today.   -Started on oral prednisone 40 mg daily and she will be discharged on the same for 5 more days.    Leukocytosis -Stable.  Acute metabolic encephalopathy causing mild confusion in a patient with mild Alzheimer's dementia -Monitor mental status.  More awake today.CT headshowed no acute abnormality.Fall precautions.  -Outpatient follow-up with neurology  Hyperlipidemia next-continue statin  History of breast cancer--status post lumpectomy/radiation. Outpatient follow-up  Hypothyroidism -Continue Synthroid  Essential hypertension--continue current antihypertensive regimen. Monitor blood pressure  Generalized weakness/debility--PT recommends SNF.  Patient and family members refused SNF placement.  She will be discharged back to ALF with PT/OT  01/18/19 Patient is here today with her son.  Son states that while she was in the hospital, she was very confused at night.  The nurses were frequently having to go into her room because of sundowning.  He states that ever since she has been back at her assisted living facility since leaving the hospital the confusion has persisted.  She is more disoriented.  She does not recollect that she is lived in this facility.  She is very disoriented.  She states that she is talking to people who have passed away.  She was asking about her dead sister and when she was coming to visit.  On 2 separate occasions she is walked into the room of a different patient disoriented with where she is staying in the middle of the night.  This is much worse than her baseline.  Today on examination, the patient repeatedly reports that there is pressure and congestion in her frontal sinus area.  However her son states that she is not blowing her nose very often.  She does have some coughing on occasion however there are pronounced rails and crackles in her left lung on exam.  She  denies any dysuria or abdominal pain however I question any history that she is able to provide due to her current level of disorientation.  She is afebrile today.  Question if patient is sundowning from her recent hospitalization or if she may have a secondary infection secondary to the hospitalization such as a bladder infection or possible pneumonia given the abnormal breath sounds today on her pulmonary exam Past Medical History:  Diagnosis Date  . Allergy   . Anxiety   . Arthritis   . Blood transfusion without reported diagnosis   . Cancer ([place])    breast, s/p RXT, bilateral  . Cataract   . History of echocardiogram    a. Echo 8/16: Vigorous LVF, EF 51-02%, grade 1 diastolic dysfunction, trivial AI, mild MR, mild RVE, normal RV function, PASP 33 mmHg // b. Echo 1/17 EF 65-70%, normal wall motion, grade 1 diastolic dysfunction, trivial AI, MAC, mild MR // c. Echo 11/26/16: mild LVH, EF 60-65, mild AI, mild MR, mild LAE, mild to mod TR  . History of stress test    Lexiscan Myoview 8/16:  EF 81%, breast attenuation, no ischemia; Low Risk // Myoview 11/26/16: EF 75, apical defect c/w soft tissue attenuation, no ischemia or scar; Low Risk  . HOCM (hypertrophic obstructive cardiomyopathy) ([place])   . Hyperlipidemia   . Hypertension   . Hypothyroidism   . Shortness of breath   . Thyroid disease    Past Surgical History:  Procedure Laterality Date  . ABDOMINAL HYSTERECTOMY  2008  . bladder tact    . BREAST LUMPECTOMY Right 2000  . BREAST LUMPECTOMY Left 2007  . CATARACT EXTRACTION  2011  . JOINT REPLACEMENT    . KNEE SURGERY     right  . ROTATOR CUFF REPAIR     right  . THYROID SURGERY  2002  . TOTAL KNEE ARTHROPLASTY Left 06/02/2013   Dr Rhona Raider  . TOTAL KNEE ARTHROPLASTY Right 06/02/2013   Procedure: RIGHT TOTAL KNEE ARTHROPLASTY;  Surgeon: Hessie Dibble, MD;  Location: [facility];  Service: Orthopedics;  Laterality: Right;  . TOTAL KNEE ARTHROPLASTY Left 08/22/2014   Procedure: TOTAL  KNEE ARTHROPLASTY;  Surgeon: Hessie Dibble, MD;  Location: [facility];  Service: Orthopedics;  Laterality: Left;   Current Outpatient Medications on File Prior to Visit  Medication Sig Dispense Refill  . acetaminophen (TYLENOL) 325 MG tablet TAKE 1 TAB BY MOUTH EVERY 6 HOURS AS NEEDED FOR MILD PAIN (Patient taking differently: Take 325 mg by mouth every 6 (six) hours as needed for mild pain. ) 30 tablet 5  . albuterol (PROVENTIL HFA;VENTOLIN HFA) 108 (90 Base) MCG/ACT inhaler Inhale 2 puffs into the lungs every 4 (four) hours as needed for wheezing or shortness of breath. 1 Inhaler 0  . amLODipine (NORVASC) 5 MG tablet TAKE 1 TABLET BY MOUTH EVERY DAY 90 tablet 3  . aspirin 81 MG tablet Take 81 mg by mouth daily.    . Calcium Carbonate-Vit D-Min (GNP CALCIUM 1200) 1200-1000 MG-UNIT CHEW 1 tab PO QAM (Patient taking differently: Chew 1 tablet by mouth. ) 30 tablet   . cholecalciferol (VITAMIN D) 1000 units tablet Take 1,000 Units by mouth daily.    . Cranberry 500 MG TABS Take 500 mg by mouth daily.     Marland Kitchen  dextromethorphan-guaiFENesin (MUCINEX DM) 30-600 MG 12hr tablet Take 1 tablet by mouth 2 (two) times daily. 20 tablet 0  . donepezil (ARICEPT) 5 MG tablet TAKE 1 TABLET (5 MG TOTAL) BY MOUTH AT BEDTIME. 30 tablet 5  . exemestane (AROMASIN) 25 MG tablet TAKE 1 TAB BY MOUTH EVERY DAY (Patient taking differently: Take 25 mg by mouth daily after breakfast. ) 30 tablet 10  . folic acid (FOLVITE) 1 MG tablet TAKE 1 TAB BY MOUTH EVERY DAY (Patient taking differently: Take 1 mg by mouth daily. ) 30 tablet 5  . hydrochlorothiazide (HYDRODIURIL) 25 MG tablet Take 1 tablet (25 mg total) by mouth daily. 90 tablet 3  . KLOR-CON M20 20 MEQ tablet TAKE 1 TABLET BY MOUTH DAILY (Patient taking differently: Take 20 mEq by mouth once. ) 90 tablet 3  . levothyroxine (SYNTHROID, LEVOTHROID) 125 MCG tablet TAKE 1 TAB BY MOUTH EVERY DAY (Patient taking differently: Take 125 mcg by mouth daily before breakfast. TAKE 1 TAB BY  MOUTH EVERY DAY) 30 tablet 10  . LORazepam (ATIVAN) 1 MG tablet 1 tab po qhs (Patient taking differently: Take 1 mg by mouth at bedtime. ) 30 tablet 2  . losartan (COZAAR) 100 MG tablet TAKE 1 TAB BY MOUTH EVERY DAY FOR HTN (Patient taking differently: Take 100 mg by mouth daily. ) 30 tablet 10  . Multiple Vitamins-Minerals (CENTRUM SILVER ADULT 50+ PO) Take 1 tablet by mouth daily.     . nebivolol (BYSTOLIC) 5 MG tablet TAKE 1 TAB BY MOUTH EVERY DAY FOR HTN (Patient taking differently: Take 5 mg by mouth daily. ) 90 tablet 3  . nitroGLYCERIN (NITROSTAT) 0.4 MG SL tablet Place 1 tablet (0.4 mg total) under the tongue every 5 (five) minutes as needed for chest pain. 25 tablet 3  . rosuvastatin (CRESTOR) 10 MG tablet TAKE 1 TABLET BY MOUTH EVERY DAY AT 6PM (Patient taking differently: Take 10 mg by mouth daily. ) 90 tablet 3  . sertraline (ZOLOFT) 100 MG tablet Take 1 tablet (100 mg total) by mouth daily. 30 tablet 3  . terbinafine (LAMISIL) 250 MG tablet Take 250 mg by mouth daily.    Marland Kitchen triamcinolone cream (KENALOG) 0.1 % Apply 1 application topically 2 (two) times daily. 30 g 0   No current facility-administered medications on file prior to visit.    Allergies  Allergen Reactions  . Ambien [Zolpidem]     confusion  . Pyridium [Phenazopyridine Hcl] Other (See Comments)    hemolysis  . Sulfa Antibiotics Rash   Social History   Socioeconomic History  . Marital status: Widowed    Spouse name: Not on file  . Number of children: Not on file  . Years of education: Not on file  . Highest education level: Not on file  Occupational History  . Not on file  Social Needs  . Financial resource strain: Not on file  . Food insecurity:    Worry: Not on file    Inability: Not on file  . Transportation needs:    Medical: Not on file    Non-medical: Not on file  Tobacco Use  . Smoking status: Never Smoker  . Smokeless tobacco: Never Used  . Tobacco comment: never used tobacco  Substance and  Sexual Activity  . Alcohol use: No    Alcohol/week: 0.0 standard drinks  . Drug use: No  . Sexual activity: Not Currently  Lifestyle  . Physical activity:    Days per week: Not on file  Minutes per session: Not on file  . Stress: Not on file  Relationships  . Social connections:    Talks on phone: Not on file    Gets together: Not on file    Attends religious service: Not on file    Active member of club or organization: Not on file    Attends meetings of clubs or organizations: Not on file    Relationship status: Not on file  . Intimate partner violence:    Fear of current or ex partner: Not on file    Emotionally abused: Not on file    Physically abused: Not on file    Forced sexual activity: Not on file  Other Topics Concern  . Not on file  Social History Narrative  . Not on file      Review of Systems  All other systems reviewed and are negative.      Objective:   Physical Exam Vitals signs reviewed.  Constitutional:      Appearance: Normal appearance.  Cardiovascular:     Rate and Rhythm: Normal rate.     Pulses: Normal pulses.     Heart sounds: Normal heart sounds.  Pulmonary:     Effort: Pulmonary effort is normal.     Breath sounds: Examination of the left-middle field reveals rhonchi and rales. Examination of the left-lower field reveals rhonchi and rales. Rhonchi and rales present.  Abdominal:     General: Bowel sounds are normal. There is no distension.     Palpations: Abdomen is soft.     Tenderness: There is no abdominal tenderness. There is no guarding or rebound.  Neurological:     Mental Status: She is alert.         Assessment & Plan:  Delirium - Plan: DG Chest 2 View  Patient has been delirious since leaving the hospital.  I explained to the patient sign it could simply be sundowning from her recent acute illness.  However I am concerned based on her physical exam that she may be developing a hospital-acquired pneumonia.  Therefore I  will order a chest x-ray and start the patient on Levaquin 500 mg daily for 7 days.  I would like to see her back next week to reassess the patient or sooner if getting worse.  I did try to obtain a urinalysis.  The patient went to the restroom with my nurse.  However she was incontinent and missed the toilet when she tried to urinate and was unable to hit the nurses hat.  Therefore we were unable to send a urinalysis or urine culture today.  I will order the urinalysis and urine culture at the assisted living facility where she stays.  Reassess the patient in 1 week.  If sundowning persists, consider repeat imaging/MRI of the brain to evaluate for possible stroke however I suspect sundowning versus hospital-acquired pneumonia based on her exam today

## 2019-01-25 ENCOUNTER — Inpatient Hospital Stay: Payer: Medicare Other | Admitting: Family Medicine

## 2019-01-27 ENCOUNTER — Inpatient Hospital Stay: Payer: Medicare Other | Admitting: Family Medicine

## 2019-02-01 ENCOUNTER — Ambulatory Visit (INDEPENDENT_AMBULATORY_CARE_PROVIDER_SITE_OTHER): Payer: Medicare Other | Admitting: Family Medicine

## 2019-02-01 ENCOUNTER — Encounter: Payer: Self-pay | Admitting: Family Medicine

## 2019-02-01 VITALS — BP 120/74 | HR 60 | Temp 97.9°F | Resp 16 | Ht 62.5 in | Wt 151.0 lb

## 2019-02-01 DIAGNOSIS — I421 Obstructive hypertrophic cardiomyopathy: Secondary | ICD-10-CM | POA: Diagnosis not present

## 2019-02-01 DIAGNOSIS — R410 Disorientation, unspecified: Secondary | ICD-10-CM

## 2019-02-01 MED ORDER — METOPROLOL SUCCINATE ER 25 MG PO TB24: 25.0000 mg | ORAL_TABLET | Freq: Every day | ORAL | 3 refills | Status: DC

## 2019-02-01 NOTE — Progress Notes (Signed)
Subjective:    Patient ID: Hannah Galvan, female    DOB: 23-Jan-1931, 85 y.o.   MRN: [id number]  HPI  Recently admitted to the hospital.  I have copied relevant portions of the dc summary below for reference: Admit date: 01/08/2019 Discharge date: 01/11/2019  Brief/Interim Summary: 85 year old female with history of hypothyroidism, hypertension, hyperlipidemia, mild dementia, HOCM, breast cancer status post radiation and lumpectomy presented with shortness of breath from ALF. She was found to be febrile. She was started on IV fluids and antibiotics probably for pneumonia.  She was found to have RSV.  Antibiotics were discontinued.  Currently afebrile.  Still requiring oxygen by nasal cannula 2 L/min.  Might need home oxygen on discharge.  Will discharge back to ALF on prednisone daily for 5 days.  Patient/family members refused SNF placement.  Discharge Diagnoses:  Principal Problem:   Sepsis ([facility]) Active Problems:   Thyroid disease   Hyperlipidemia   Breast cancer ([place])   Hypothyroidism   Essential hypertension   Alzheimer's dementia without behavioral disturbance ([place])   Acute metabolic encephalopathy  Sepsis present on admission -Respiratory virus panel is positive for RSV.  -Treated with broad-spectrum antibiotics initially as she was spiking temperatures. -Antibiotics have been discontinued.  Sepsis has resolved.  Orthostatic so far   acute hypoxic respiratory failure -Probably from RSV bronchitis -Initially required oxygen via nasal cannula at 4 L/min.  Currently at 2 L/min.  Wean off if possible.  Patient might need oxygen supplementation on discharge if she continues to require oxygen on ambulation.   -There was a concern for pneumonia initially and patient was started on Rocephin and Zithromax.  Antibiotics were subsequently discontinued after respiratory virus panel was positive for RSV.  Chest x-ray was negative for infiltrates.   -Patient was given 1 dose of  intravenous Lasix yesterday.  She will be given 1 more dose of intravenous Lasix today.   -Started on oral prednisone 40 mg daily and she will be discharged on the same for 5 more days.    Leukocytosis -Stable.  Acute metabolic encephalopathy causing mild confusion in a patient with mild Alzheimer's dementia -Monitor mental status.  More awake today.CT headshowed no acute abnormality.Fall precautions.  -Outpatient follow-up with neurology  Hyperlipidemia next-continue statin  History of breast cancer--status post lumpectomy/radiation. Outpatient follow-up  Hypothyroidism -Continue Synthroid  Essential hypertension--continue current antihypertensive regimen. Monitor blood pressure  Generalized weakness/debility--PT recommends SNF.  Patient and family members refused SNF placement.  She will be discharged back to ALF with PT/OT  01/18/19 Patient is here today with her son.  Son states that while she was in the hospital, she was very confused at night.  The nurses were frequently having to go into her room because of sundowning.  He states that ever since she has been back at her assisted living facility since leaving the hospital the confusion has persisted.  She is more disoriented.  She does not recollect that she is lived in this facility.  She is very disoriented.  She states that she is talking to people who have passed away.  She was asking about her dead sister and when she was coming to visit.  On 2 separate occasions she is walked into the room of a different patient disoriented with where she is staying in the middle of the night.  This is much worse than her baseline.  Today on examination, the patient repeatedly reports that there is pressure and congestion in her frontal sinus area.  However her son states that she is not blowing her nose very often.  She does have some coughing on occasion however there are pronounced rails and crackles in her left lung on exam.  She  denies any dysuria or abdominal pain however I question any history that she is able to provide due to her current level of disorientation.  She is afebrile today.  Question if patient is sundowning from her recent hospitalization or if she may have a secondary infection secondary to the hospitalization such as a bladder infection or possible pneumonia given the abnormal breath sounds today on her pulmonary exam.  At that time, my plan was: Patient has been delirious since leaving the hospital.  I explained to the patient sign it could simply be sundowning from her recent acute illness.  However I am concerned based on her physical exam that she may be developing a hospital-acquired pneumonia.  Therefore I will order a chest x-ray and start the patient on Levaquin 500 mg daily for 7 days.  I would like to see her back next week to reassess the patient or sooner if getting worse.  I did try to obtain a urinalysis.  The patient went to the restroom with my nurse.  However she was incontinent and missed the toilet when she tried to urinate and was unable to hit the nurses hat.  Therefore we were unable to send a urinalysis or urine culture today.  I will order the urinalysis and urine culture at the assisted living facility where she stays.  Reassess the patient in 1 week.  If sundowning persists, consider repeat imaging/MRI of the brain to evaluate for possible stroke however I suspect sundowning versus hospital-acquired pneumonia based on her exam today  02/01/19 Patient is doing much better since taking antibiotic.  The delirium has improved.  Her sundowning has improved.  She is not as disoriented in the evening.  She did recently have an episode where she became short winded after getting ready for bed.  She developed some pressure in her chest.  It improved after nurse gave her her anxiety medicine.  Echocardiogram last year revealed hypertrophic obstructive cardiomyopathy.  I question if the patient may have  had chest pain related to this due to the tachycardia.  She is currently on Bystolic to try to regulate her blood pressure and her heart rate.  Also in the hospital, she was found to have hypokalemia.  She is due to recheck her potassium level.  She denies any angina with activity.  She denies any chest pain today.  She denies any shortness of breath with activity today.  This was an isolated event that lasted a few minutes.   Past Medical History:  Diagnosis Date  . Allergy   . Anxiety   . Arthritis   . Blood transfusion without reported diagnosis   . Cancer ([place])    breast, s/p RXT, bilateral  . Cataract   . History of echocardiogram    a. Echo 8/16: Vigorous LVF, EF 70-26%, grade 1 diastolic dysfunction, trivial AI, mild MR, mild RVE, normal RV function, PASP 33 mmHg // b. Echo 1/17 EF 65-70%, normal wall motion, grade 1 diastolic dysfunction, trivial AI, MAC, mild MR // c. Echo 11/26/16: mild LVH, EF 60-65, mild AI, mild MR, mild LAE, mild to mod TR  . History of stress test    Lexiscan Myoview 8/16:  EF 81%, breast attenuation, no ischemia; Low Risk // Myoview 11/26/16: EF 75, apical defect  c/w soft tissue attenuation, no ischemia or scar; Low Risk  . HOCM (hypertrophic obstructive cardiomyopathy) ([place])   . Hyperlipidemia   . Hypertension   . Hypothyroidism   . Shortness of breath   . Thyroid disease    Past Surgical History:  Procedure Laterality Date  . ABDOMINAL HYSTERECTOMY  2008  . bladder tact    . BREAST LUMPECTOMY Right 2000  . BREAST LUMPECTOMY Left 2007  . CATARACT EXTRACTION  2011  . JOINT REPLACEMENT    . KNEE SURGERY     right  . ROTATOR CUFF REPAIR     right  . THYROID SURGERY  2002  . TOTAL KNEE ARTHROPLASTY Left 06/02/2013   Dr Rhona Raider  . TOTAL KNEE ARTHROPLASTY Right 06/02/2013   Procedure: RIGHT TOTAL KNEE ARTHROPLASTY;  Surgeon: Hessie Dibble, MD;  Location: [facility];  Service: Orthopedics;  Laterality: Right;  . TOTAL KNEE ARTHROPLASTY Left 08/22/2014    Procedure: TOTAL KNEE ARTHROPLASTY;  Surgeon: Hessie Dibble, MD;  Location: [facility];  Service: Orthopedics;  Laterality: Left;   Current Outpatient Medications on File Prior to Visit  Medication Sig Dispense Refill  . acetaminophen (TYLENOL) 325 MG tablet TAKE 1 TAB BY MOUTH EVERY 6 HOURS AS NEEDED FOR MILD PAIN (Patient taking differently: Take 325 mg by mouth every 6 (six) hours as needed for mild pain. ) 30 tablet 5  . albuterol (PROVENTIL HFA;VENTOLIN HFA) 108 (90 Base) MCG/ACT inhaler Inhale 2 puffs into the lungs every 4 (four) hours as needed for wheezing or shortness of breath. 1 Inhaler 0  . amLODipine (NORVASC) 5 MG tablet TAKE 1 TABLET BY MOUTH EVERY DAY 90 tablet 3  . aspirin 81 MG tablet Take 81 mg by mouth daily.    . Calcium Carbonate-Vit D-Min (GNP CALCIUM 1200) 1200-1000 MG-UNIT CHEW 1 tab PO QAM (Patient taking differently: Chew 1 tablet by mouth. ) 30 tablet   . cholecalciferol (VITAMIN D) 1000 units tablet Take 1,000 Units by mouth daily.    . Cranberry 500 MG TABS Take 500 mg by mouth daily.     Marland Kitchen dextromethorphan-guaiFENesin (MUCINEX DM) 30-600 MG 12hr tablet Take 1 tablet by mouth 2 (two) times daily. 20 tablet 0  . donepezil (ARICEPT) 5 MG tablet TAKE 1 TABLET (5 MG TOTAL) BY MOUTH AT BEDTIME. 30 tablet 5  . exemestane (AROMASIN) 25 MG tablet TAKE 1 TAB BY MOUTH EVERY DAY (Patient taking differently: Take 25 mg by mouth daily after breakfast. ) 30 tablet 10  . folic acid (FOLVITE) 1 MG tablet TAKE 1 TAB BY MOUTH EVERY DAY (Patient taking differently: Take 1 mg by mouth daily. ) 30 tablet 5  . hydrochlorothiazide (HYDRODIURIL) 25 MG tablet Take 1 tablet (25 mg total) by mouth daily. 90 tablet 3  . KLOR-CON M20 20 MEQ tablet TAKE 1 TABLET BY MOUTH DAILY (Patient taking differently: Take 20 mEq by mouth once. ) 90 tablet 3  . levofloxacin (LEVAQUIN) 500 MG tablet Take 1 tablet (500 mg total) by mouth daily. 7 tablet 0  . levothyroxine (SYNTHROID, LEVOTHROID) 125 MCG tablet  TAKE 1 TAB BY MOUTH EVERY DAY (Patient taking differently: Take 125 mcg by mouth daily before breakfast. TAKE 1 TAB BY MOUTH EVERY DAY) 30 tablet 10  . LORazepam (ATIVAN) 1 MG tablet 1 tab po qhs (Patient taking differently: Take 1 mg by mouth at bedtime. ) 30 tablet 2  . losartan (COZAAR) 100 MG tablet TAKE 1 TAB BY MOUTH EVERY DAY FOR HTN (Patient  taking differently: Take 100 mg by mouth daily. ) 30 tablet 10  . Multiple Vitamins-Minerals (CENTRUM SILVER ADULT 50+ PO) Take 1 tablet by mouth daily.     . nebivolol (BYSTOLIC) 5 MG tablet TAKE 1 TAB BY MOUTH EVERY DAY FOR HTN (Patient taking differently: Take 5 mg by mouth daily. ) 90 tablet 3  . nitroGLYCERIN (NITROSTAT) 0.4 MG SL tablet Place 1 tablet (0.4 mg total) under the tongue every 5 (five) minutes as needed for chest pain. 25 tablet 3  . rosuvastatin (CRESTOR) 10 MG tablet TAKE 1 TABLET BY MOUTH EVERY DAY AT 6PM (Patient taking differently: Take 10 mg by mouth daily. ) 90 tablet 3  . sertraline (ZOLOFT) 100 MG tablet Take 1 tablet (100 mg total) by mouth daily. 30 tablet 3  . terbinafine (LAMISIL) 250 MG tablet Take 250 mg by mouth daily.    Marland Kitchen triamcinolone cream (KENALOG) 0.1 % Apply 1 application topically 2 (two) times daily. 30 g 0   No current facility-administered medications on file prior to visit.    Allergies  Allergen Reactions  . Ambien [Zolpidem]     confusion  . Pyridium [Phenazopyridine Hcl] Other (See Comments)    hemolysis  . Sulfa Antibiotics Rash   Social History   Socioeconomic History  . Marital status: Widowed    Spouse name: Not on file  . Number of children: Not on file  . Years of education: Not on file  . Highest education level: Not on file  Occupational History  . Not on file  Social Needs  . Financial resource strain: Not on file  . Food insecurity:    Worry: Not on file    Inability: Not on file  . Transportation needs:    Medical: Not on file    Non-medical: Not on file  Tobacco Use  .  Smoking status: Never Smoker  . Smokeless tobacco: Never Used  . Tobacco comment: never used tobacco  Substance and Sexual Activity  . Alcohol use: No    Alcohol/week: 0.0 standard drinks  . Drug use: No  . Sexual activity: Not Currently  Lifestyle  . Physical activity:    Days per week: Not on file    Minutes per session: Not on file  . Stress: Not on file  Relationships  . Social connections:    Talks on phone: Not on file    Gets together: Not on file    Attends religious service: Not on file    Active member of club or organization: Not on file    Attends meetings of clubs or organizations: Not on file    Relationship status: Not on file  . Intimate partner violence:    Fear of current or ex partner: Not on file    Emotionally abused: Not on file    Physically abused: Not on file    Forced sexual activity: Not on file  Other Topics Concern  . Not on file  Social History Narrative  . Not on file      Review of Systems  All other systems reviewed and are negative.      Objective:   Physical Exam Vitals signs reviewed.  Constitutional:      Appearance: Normal appearance.  Cardiovascular:     Rate and Rhythm: Normal rate.     Pulses: Normal pulses.     Heart sounds: Murmur present.  Pulmonary:     Effort: Pulmonary effort is normal.     Breath sounds:  No wheezing, rhonchi or rales.  Abdominal:     General: Bowel sounds are normal. There is no distension.     Palpations: Abdomen is soft.     Tenderness: There is no abdominal tenderness. There is no guarding or rebound.  Neurological:     Mental Status: She is alert.         Assessment & Plan:  Delirium - Plan: CBC with Differential/Platelet, COMPLETE METABOLIC PANEL WITH GFR  HOCM (hypertrophic obstructive cardiomyopathy) ([place]) - Plan: metoprolol succinate (TOPROL-XL) 25 MG 24 hr tablet  Delirium has improved.  I will check a CBC to evaluate for leukocytosis.  I will also check a CMP to monitor her  hypokalemia from the hospital.  Given her history of hypertrophic obstructive cardiomyopathy, I will try to better control her heart rate.  I recommended discontinuation of Bystolic and replacing with Toprol-XL 25 mg daily.  She does have a small 4 mm white sore in the back of her mouth right where her dentures are rubbing against her gum.  I recommended using Magic mouthwash 1 teaspoon gargle and swallow 4 times daily for 1 to 2 weeks to see if this will heal.  If persistent, she may require a biopsy at a dentist office

## 2019-02-02 LAB — COMPLETE METABOLIC PANEL WITH GFR
AG RATIO: 2.4 (calc) (ref 1.0–2.5)
ALT: 35 U/L — ABNORMAL HIGH (ref 6–29)
AST: 38 U/L — ABNORMAL HIGH (ref 10–35)
Albumin: 4.5 g/dL (ref 3.6–5.1)
Alkaline phosphatase (APISO): 49 U/L (ref 37–153)
BUN/Creatinine Ratio: 17 (calc) (ref 6–22)
BUN: 17 mg/dL (ref 7–25)
CO2: 28 mmol/L (ref 20–32)
Calcium: 9.9 mg/dL (ref 8.6–10.4)
Chloride: 99 mmol/L (ref 98–110)
Creat: 1.02 mg/dL — ABNORMAL HIGH (ref 0.60–0.88)
GFR, Est African American: 58 mL/min/{1.73_m2} — ABNORMAL LOW (ref >=60)
GFR, Est Non African American: 50 mL/min/{1.73_m2} — ABNORMAL LOW (ref >=60)
Globulin: 1.9 g/dL (calc) (ref 1.9–3.7)
Glucose, Bld: 85 mg/dL (ref 65–99)
POTASSIUM: 4.1 mmol/L (ref 3.5–5.3)
Sodium: 136 mmol/L (ref 135–146)
Total Bilirubin: 0.4 mg/dL (ref 0.2–1.2)
Total Protein: 6.4 g/dL (ref 6.1–8.1)

## 2019-02-02 LAB — CBC WITH DIFFERENTIAL/PLATELET
Absolute Monocytes: 731 cells/uL (ref 200–950)
Basophils Absolute: 58 cells/uL (ref 0–200)
Basophils Relative: 1 %
Eosinophils Absolute: 191 cells/uL (ref 15–500)
Eosinophils Relative: 3.3 %
HCT: 38.9 % (ref 35.0–45.0)
Hemoglobin: 13.2 g/dL (ref 11.7–15.5)
Lymphs Abs: 2372 cells/uL (ref 850–3900)
MCH: 32.1 pg (ref 27.0–33.0)
MCHC: 33.9 g/dL (ref 32.0–36.0)
MCV: 94.6 fL (ref 80.0–100.0)
MPV: 11.7 fL (ref 7.5–12.5)
Monocytes Relative: 12.6 %
Neutro Abs: 2448 cells/uL (ref 1500–7800)
Neutrophils Relative %: 42.2 %
Platelets: 147 10*3/uL (ref 140–400)
RBC: 4.11 10*6/uL (ref 3.80–5.10)
RDW: 13.1 % (ref 11.0–15.0)
Total Lymphocyte: 40.9 %
WBC: 5.8 10*3/uL (ref 3.8–10.8)

## 2019-02-03 ENCOUNTER — Telehealth: Payer: Self-pay | Admitting: Family Medicine

## 2019-02-03 NOTE — Telephone Encounter (Signed)
Pt needs [place] to change pharmacy to [facility] for all meds, she gets them [facility] do not send any prescriptions to local pharmacy call [facility] if you have any ?'s [phone].

## 2019-02-21 ENCOUNTER — Other Ambulatory Visit: Payer: Self-pay | Admitting: Family Medicine

## 2019-02-21 DIAGNOSIS — I421 Obstructive hypertrophic cardiomyopathy: Secondary | ICD-10-CM

## 2019-02-21 MED ORDER — SERTRALINE HCL 100 MG PO TABS: 100.0000 mg | ORAL_TABLET | Freq: Every day | ORAL | 3 refills | Status: AC

## 2019-02-21 MED ORDER — DONEPEZIL HCL 5 MG PO TABS: 5.0000 mg | ORAL_TABLET | Freq: Every day | ORAL | 3 refills | Status: AC

## 2019-02-21 MED ORDER — LOSARTAN POTASSIUM 100 MG PO TABS: ORAL_TABLET | ORAL | 3 refills | Status: AC

## 2019-02-21 MED ORDER — ROSUVASTATIN CALCIUM 10 MG PO TABS: ORAL_TABLET | ORAL | 3 refills | Status: AC

## 2019-02-21 MED ORDER — HYDROCHLOROTHIAZIDE 25 MG PO TABS: 25.0000 mg | ORAL_TABLET | Freq: Every day | ORAL | 3 refills | Status: AC

## 2019-02-21 MED ORDER — POTASSIUM CHLORIDE CRYS ER 20 MEQ PO TBCR: 20.0000 meq | EXTENDED_RELEASE_TABLET | Freq: Every day | ORAL | 3 refills | Status: AC

## 2019-02-21 MED ORDER — METOPROLOL SUCCINATE ER 25 MG PO TB24: 25.0000 mg | ORAL_TABLET | Freq: Every day | ORAL | 3 refills | Status: AC

## 2019-02-21 MED ORDER — FOLIC ACID 1 MG PO TABS: ORAL_TABLET | ORAL | 3 refills | Status: AC

## 2019-02-21 MED ORDER — LEVOTHYROXINE SODIUM 125 MCG PO TABS: ORAL_TABLET | ORAL | 3 refills | Status: AC

## 2019-02-21 MED ORDER — LORAZEPAM 1 MG PO TABS: ORAL_TABLET | ORAL | 2 refills | Status: DC

## 2019-02-21 MED ORDER — AMLODIPINE BESYLATE 5 MG PO TABS: 5.0000 mg | ORAL_TABLET | Freq: Every day | ORAL | 3 refills | Status: AC

## 2019-02-21 NOTE — Telephone Encounter (Signed)
Requesting refill Lorazepam      LOV: 02/01/19  LRF:  09/16/18

## 2019-02-21 NOTE — Telephone Encounter (Signed)
Received a fax from [facility] that pt needed all medications sent to [facility]. All medications sent.

## 2019-04-12 ENCOUNTER — Telehealth: Payer: Self-pay | Admitting: Family Medicine

## 2019-04-12 NOTE — Telephone Encounter (Signed)
[facility] wanting VO for COVID-19 testing.

## 2019-04-13 NOTE — Telephone Encounter (Signed)
Order faxed for pt to have covid test

## 2019-04-15 NOTE — Telephone Encounter (Signed)
[facility] called and pt tested positive for COVD-19 but she is not having any symptoms and they have put her on precautions for 2 weeks.

## 2019-04-15 NOTE — Telephone Encounter (Signed)
noted 

## 2019-06-21 ENCOUNTER — Emergency Department (HOSPITAL_COMMUNITY): Payer: Medicare Other

## 2019-06-21 ENCOUNTER — Other Ambulatory Visit: Payer: Self-pay

## 2019-06-21 ENCOUNTER — Encounter (HOSPITAL_COMMUNITY): Payer: Self-pay | Admitting: Emergency Medicine

## 2019-06-21 ENCOUNTER — Emergency Department (HOSPITAL_COMMUNITY)
Admission: EM | Admit: 2019-06-21 | Discharge: 2019-06-21 | Disposition: A | Discharge status: Home / Self Care | Payer: Medicare Other | Attending: Emergency Medicine | Admitting: Emergency Medicine

## 2019-06-21 DIAGNOSIS — M25551 Pain in right hip: Secondary | ICD-10-CM | POA: Insufficient documentation

## 2019-06-21 DIAGNOSIS — Z79899 Other long term (current) drug therapy: Secondary | ICD-10-CM | POA: Insufficient documentation

## 2019-06-21 DIAGNOSIS — W1830XA Fall on same level, unspecified, initial encounter: Secondary | ICD-10-CM | POA: Diagnosis not present

## 2019-06-21 DIAGNOSIS — Z7982 Long term (current) use of aspirin: Secondary | ICD-10-CM | POA: Diagnosis not present

## 2019-06-21 DIAGNOSIS — Z853 Personal history of malignant neoplasm of breast: Secondary | ICD-10-CM | POA: Insufficient documentation

## 2019-06-21 DIAGNOSIS — I10 Essential (primary) hypertension: Secondary | ICD-10-CM | POA: Insufficient documentation

## 2019-06-21 DIAGNOSIS — G309 Alzheimer's disease, unspecified: Secondary | ICD-10-CM | POA: Insufficient documentation

## 2019-06-21 DIAGNOSIS — Y93E8 Activity, other personal hygiene: Secondary | ICD-10-CM | POA: Diagnosis not present

## 2019-06-21 DIAGNOSIS — E039 Hypothyroidism, unspecified: Secondary | ICD-10-CM | POA: Insufficient documentation

## 2019-06-21 DIAGNOSIS — Y92121 Bathroom in nursing home as the place of occurrence of the external cause: Secondary | ICD-10-CM | POA: Diagnosis not present

## 2019-06-21 DIAGNOSIS — S0101XA Laceration without foreign body of scalp, initial encounter: Secondary | ICD-10-CM | POA: Insufficient documentation

## 2019-06-21 DIAGNOSIS — W19XXXA Unspecified fall, initial encounter: Secondary | ICD-10-CM

## 2019-06-21 DIAGNOSIS — Y999 Unspecified external cause status: Secondary | ICD-10-CM | POA: Insufficient documentation

## 2019-06-21 LAB — CBC WITH DIFFERENTIAL/PLATELET
Abs Immature Granulocytes: 0.02 10*3/uL (ref 0.00–0.07)
Basophils Absolute: 0.1 10*3/uL (ref 0.0–0.1)
Basophils Relative: 1 %
Eosinophils Absolute: 0.1 10*3/uL (ref 0.0–0.5)
Eosinophils Relative: 1 %
HCT: 41 % (ref 36.0–46.0)
Hemoglobin: 13.7 g/dL (ref 12.0–15.0)
Immature Granulocytes: 0 %
Lymphocytes Relative: 30 %
Lymphs Abs: 2.2 10*3/uL (ref 0.7–4.0)
MCH: 31.9 pg (ref 26.0–34.0)
MCHC: 33.4 g/dL (ref 30.0–36.0)
MCV: 95.6 fL (ref 80.0–100.0)
Monocytes Absolute: 0.7 10*3/uL (ref 0.1–1.0)
Monocytes Relative: 10 %
Neutro Abs: 4.2 10*3/uL (ref 1.7–7.7)
Neutrophils Relative %: 58 %
Platelets: 169 10*3/uL (ref 150–400)
RBC: 4.29 MIL/uL (ref 3.87–5.11)
RDW: 12.8 % (ref 11.5–15.5)
WBC: 7.3 10*3/uL (ref 4.0–10.5)
nRBC: 0 % (ref 0.0–0.2)

## 2019-06-21 LAB — BASIC METABOLIC PANEL
Anion gap: 9 (ref 5–15)
BUN: 19 mg/dL (ref 8–23)
CO2: 26 mmol/L (ref 22–32)
Calcium: 8.8 mg/dL — ABNORMAL LOW (ref 8.9–10.3)
Chloride: 101 mmol/L (ref 98–111)
Creatinine, Ser: 0.9 mg/dL (ref 0.44–1.00)
GFR calc Af Amer: >60 mL/min (ref >60)
GFR calc non Af Amer: 58 mL/min — ABNORMAL LOW (ref >60)
Glucose, Bld: 96 mg/dL (ref 70–99)
Potassium: 3.7 mmol/L (ref 3.5–5.1)
Sodium: 136 mmol/L (ref 135–145)

## 2019-06-21 MED ORDER — SODIUM CHLORIDE 0.9 % IV BOLUS
500.0000 mL | Freq: Once | INTRAVENOUS | Status: AC
  Administered 2019-06-21: 500 mL via INTRAVENOUS

## 2019-06-21 NOTE — ED Notes (Signed)
CALLED [facility] FOR PT TRANSPORT  

## 2019-06-21 NOTE — ED Provider Notes (Signed)
[facility] EMERGENCY DEPARTMENT Provider Note   CSN: [id number] Arrival date & time: 06/21/19  8333    History   Chief Complaint Chief Complaint  Patient presents with  . Fall    HPI Hannah Galvan is a 85 y.o. female.     The history is provided by the patient.  Fall This is a new problem. The current episode started 1 to 2 hours ago. The problem has been resolved. Pertinent negatives include no chest pain, no abdominal pain and no shortness of breath. Nothing aggravates the symptoms. Nothing relieves the symptoms. She has tried nothing for the symptoms. The treatment provided no relief.    Past Medical History:  Diagnosis Date  . Allergy   . Anxiety   . Arthritis   . Blood transfusion without reported diagnosis   . Cancer ([place])    breast, s/p RXT, bilateral  . Cataract   . History of echocardiogram    a. Echo 8/16: Vigorous LVF, EF 83-29%, grade 1 diastolic dysfunction, trivial AI, mild MR, mild RVE, normal RV function, PASP 33 mmHg // b. Echo 1/17 EF 65-70%, normal wall motion, grade 1 diastolic dysfunction, trivial AI, MAC, mild MR // c. Echo 11/26/16: mild LVH, EF 60-65, mild AI, mild MR, mild LAE, mild to mod TR  . History of stress test    Lexiscan Myoview 8/16:  EF 81%, breast attenuation, no ischemia; Low Risk // Myoview 11/26/16: EF 75, apical defect c/w soft tissue attenuation, no ischemia or scar; Low Risk  . HOCM (hypertrophic obstructive cardiomyopathy) ([place])   . Hyperlipidemia   . Hypertension   . Hypothyroidism   . Shortness of breath   . Thyroid disease     Patient Active Problem List   Diagnosis Date Noted  . Sepsis ([place]) 01/08/2019  . Acute metabolic encephalopathy 19/16/6060  . Fall at home, initial encounter 03/02/2018  . Head contusion 03/02/2018  . HOCM (hypertrophic obstructive cardiomyopathy) ([place])   . Alzheimer's dementia without behavioral disturbance ([place]) 01/27/2017  . Chest pain 11/17/2015  . Dyspnea 12/06/2014  .  Anxiety and depression 09/03/2014  . CN (constipation) 09/03/2014  . Essential hypertension 08/28/2014  . Status post left knee replacement 08/28/2014  . Acute delirium 06/05/2013  . Hyponatremia 06/05/2013  . Acute urinary retention 06/05/2013  . Hypokalemia 06/05/2013  . Fever, unspecified 06/05/2013  . Hypothyroidism 06/05/2013  . Left knee DJD 06/02/2013    Class: Chronic  . UTI (lower urinary tract infection) 08/31/2012  . Heart murmur, aortic 07/15/2012  . Dizziness - giddy 07/15/2012  . Gait disturbance 12/21/2011  . Osteopenia 12/21/2011  . Cholelithiasis with cholecystitis without obstruction 12/21/2011  . Liver hemangioma 12/21/2011  . Pulmonary nodule, right 12/21/2011  . Breast cancer ([place]) 12/03/2011  . Edema of both legs 09/15/2011  . Thyroid disease   . Hyperlipidemia   . Arthritis   . Cancer [facility])     Past Surgical History:  Procedure Laterality Date  . ABDOMINAL HYSTERECTOMY  2008  . bladder tact    . BREAST LUMPECTOMY Right 2000  . BREAST LUMPECTOMY Left 2007  . CATARACT EXTRACTION  2011  . JOINT REPLACEMENT    . KNEE SURGERY     right  . ROTATOR CUFF REPAIR     right  . THYROID SURGERY  2002  . TOTAL KNEE ARTHROPLASTY Left 06/02/2013   Dr Rhona Raider  . TOTAL KNEE ARTHROPLASTY Right 06/02/2013   Procedure: RIGHT TOTAL KNEE ARTHROPLASTY;  Surgeon: Hessie Dibble,  MD;  Location: [place];  Service: Orthopedics;  Laterality: Right;  . TOTAL KNEE ARTHROPLASTY Left 08/22/2014   Procedure: TOTAL KNEE ARTHROPLASTY;  Surgeon: Hessie Dibble, MD;  Location: [place];  Service: Orthopedics;  Laterality: Left;     OB History    Gravida  2   Para  2   Term      Preterm      AB      Living        SAB      TAB      Ectopic      Multiple      Live Births               Home Medications    Prior to Admission medications   Medication Sig Start Date End Date Taking? Authorizing Provider  acetaminophen (TYLENOL) 325 MG tablet TAKE 1 TAB BY  MOUTH EVERY 6 HOURS AS NEEDED FOR MILD PAIN Patient taking differently: Take 325 mg by mouth every 6 (six) hours as needed for mild pain.  12/22/18   Susy Frizzle, MD  albuterol (PROVENTIL HFA;VENTOLIN HFA) 108 (90 Base) MCG/ACT inhaler Inhale 2 puffs into the lungs every 4 (four) hours as needed for wheezing or shortness of breath. 01/11/19   Aline August, MD  amLODipine (NORVASC) 5 MG tablet Take 1 tablet (5 mg total) by mouth daily. 02/21/19   Susy Frizzle, MD  aspirin 81 MG tablet Take 81 mg by mouth daily.    [provider]  Calcium Carbonate-Vit D-Min (GNP CALCIUM 1200) 1200-1000 MG-UNIT CHEW 1 tab PO QAM Patient taking differently: Chew 1 tablet by mouth.  08/05/17   Susy Frizzle, MD  cholecalciferol (VITAMIN D) 1000 units tablet Take 1,000 Units by mouth daily.    [provider]  Cranberry 500 MG TABS Take 500 mg by mouth daily.     [provider]  dextromethorphan-guaiFENesin (MUCINEX DM) 30-600 MG 12hr tablet Take 1 tablet by mouth 2 (two) times daily. 01/11/19   Aline August, MD  donepezil (ARICEPT) 5 MG tablet Take 1 tablet (5 mg total) by mouth at bedtime. 02/21/19   Susy Frizzle, MD  exemestane (AROMASIN) 25 MG tablet TAKE 1 TAB BY MOUTH EVERY DAY Patient taking differently: Take 25 mg by mouth daily after breakfast.  12/13/18   Susy Frizzle, MD  folic acid (FOLVITE) 1 MG tablet TAKE 1 TAB BY MOUTH EVERY DAY 02/21/19   Susy Frizzle, MD  hydrochlorothiazide (HYDRODIURIL) 25 MG tablet Take 1 tablet (25 mg total) by mouth daily. 02/21/19   Susy Frizzle, MD  levofloxacin (LEVAQUIN) 500 MG tablet Take 1 tablet (500 mg total) by mouth daily. 01/18/19   Susy Frizzle, MD  levothyroxine (SYNTHROID, LEVOTHROID) 125 MCG tablet TAKE 1 TAB BY MOUTH EVERY DAY 02/21/19   Susy Frizzle, MD  LORazepam (ATIVAN) 1 MG tablet 1 tab po qhs 02/21/19   Susy Frizzle, MD  losartan (COZAAR) 100 MG tablet TAKE 1 TAB BY MOUTH EVERY DAY FOR HTN  02/21/19   Susy Frizzle, MD  metoprolol succinate (TOPROL-XL) 25 MG 24 hr tablet Take 1 tablet (25 mg total) by mouth daily. 02/21/19   Susy Frizzle, MD  Multiple Vitamins-Minerals (CENTRUM SILVER ADULT 50+ PO) Take 1 tablet by mouth daily.     [provider]  nitroGLYCERIN (NITROSTAT) 0.4 MG SL tablet Place 1 tablet (0.4 mg total) under the tongue every 5 (five) minutes  as needed for chest pain. 11/29/15   Richardson Dopp T, PA-C  potassium chloride SA (KLOR-CON M20) 20 MEQ tablet Take 1 tablet (20 mEq total) by mouth daily. 02/21/19   Susy Frizzle, MD  rosuvastatin (CRESTOR) 10 MG tablet TAKE 1 TABLET BY MOUTH EVERY DAY AT 6PM 02/21/19   Susy Frizzle, MD  sertraline (ZOLOFT) 100 MG tablet Take 1 tablet (100 mg total) by mouth daily. 02/21/19   Susy Frizzle, MD  terbinafine (LAMISIL) 250 MG tablet Take 250 mg by mouth daily. 12/21/18   [provider]  triamcinolone cream (KENALOG) 0.1 % Apply 1 application topically 2 (two) times daily. 02/16/18   Susy Frizzle, MD    Family History Family History  Problem Relation Age of Onset  . Hypertension Mother   . Stroke Mother   . Cancer Father   . Cancer Sister        leukemia  . Cancer Brother        leukemia  . Cancer Sister        vaginal  . Cancer Sister        bladder  . Cancer Brother        prostate, skin  . Cancer Brother        colon  . Cancer Brother        prostate, bone mets  . Cancer Brother        skin  . Heart disease Sister     Social History Social History   Tobacco Use  . Smoking status: Never Smoker  . Smokeless tobacco: Never Used  . Tobacco comment: never used tobacco  Substance Use Topics  . Alcohol use: No    Alcohol/week: 0.0 standard drinks  . Drug use: No     Allergies   Ambien [zolpidem], Pyridium [phenazopyridine hcl], and Sulfa antibiotics   Review of Systems Review of Systems  Constitutional: Negative for chills and fever.  HENT: Negative for ear  pain and sore throat.   Eyes: Negative for pain and visual disturbance.  Respiratory: Negative for cough and shortness of breath.   Cardiovascular: Negative for chest pain and palpitations.  Gastrointestinal: Negative for abdominal pain and vomiting.  Genitourinary: Negative for dysuria and hematuria.  Musculoskeletal: Negative for arthralgias and back pain.  Skin: Positive for wound. Negative for color change and rash.  Neurological: Negative for seizures and syncope.  All other systems reviewed and are negative.    Physical Exam Updated Vital Signs BP (!) 133/106   Pulse 67   Resp (!) 22   Ht _0  (1.6 m)   Wt 63.5 kg   SpO2 97%   BMI 24.80 kg/m   Physical Exam Vitals signs and nursing note reviewed.  Constitutional:      General: She is not in acute distress.    Appearance: She is well-developed. She is not ill-appearing.  HENT:     Nose: Nose normal.     Mouth/Throat:     Mouth: Mucous membranes are moist.  Eyes:     Conjunctiva/sclera: Conjunctivae normal.     Pupils: Pupils are equal, round, and reactive to light.  Neck:     Musculoskeletal: Normal range of motion and neck supple.  Cardiovascular:     Rate and Rhythm: Normal rate and regular rhythm.     Pulses: Normal pulses.     Heart sounds: Normal heart sounds. No murmur.  Pulmonary:     Effort: Pulmonary effort is normal. No respiratory  distress.     Breath sounds: Normal breath sounds.  Abdominal:     Palpations: Abdomen is soft.     Tenderness: There is no abdominal tenderness.  Musculoskeletal:        General: Tenderness (TTP to right hip, no midline spinal tenderness) present.  Skin:    General: Skin is warm and dry.     Comments: Laceration to right side of scalp, hemostatic.  Neurological:     General: No focal deficit present.     Mental Status: She is alert and oriented to person, place, and time.     Cranial Nerves: No cranial nerve deficit.     Sensory: No sensory deficit.      Coordination: Coordination normal.     Comments: 5+ out of 5 strength throughout, normal sensation, normal finger-to-nose finger  Psychiatric:        Mood and Affect: Mood normal.      ED Treatments / Results  Labs (all labs ordered are listed, but only abnormal results are displayed) Labs Reviewed  BASIC METABOLIC PANEL - Abnormal; Notable for the following components:      Result Value   Calcium 8.8 (*)    GFR calc non Af Amer 58 (*)    All other components within normal limits  CBC WITH DIFFERENTIAL/PLATELET    EKG EKG Interpretation  Date/Time:  Tuesday June 21 2019 09:25:27 EDT Ventricular Rate:  66 PR Interval:    QRS Duration: 145 QT Interval:  495 QTC Calculation: 519 R Axis:   -15 Text Interpretation:  Sinus rhythm Left bundle branch block Confirmed by Lennice Sites [phone]) on 06/21/2019 9:29:09 AM   Radiology Dg Chest 2 View  Result Date: 06/21/2019 CLINICAL DATA:  Fall today EXAM: CHEST - 2 VIEW COMPARISON:  01/18/2019 FINDINGS: Cardiac enlargement without heart failure. Lungs well aerated and clear. Hiatal hernia. No acute skeletal abnormality. IMPRESSION: No active cardiopulmonary disease. Electronically Signed   By: Franchot Gallo M.D.   On: 06/21/2019 09:03   Ct Head Wo Contrast  Result Date: 06/21/2019 CLINICAL DATA:  Fall and head trauma EXAM: CT HEAD WITHOUT CONTRAST CT CERVICAL SPINE WITHOUT CONTRAST TECHNIQUE: Multidetector CT imaging of the head and cervical spine was performed following the standard protocol without intravenous contrast. Multiplanar CT image reconstructions of the cervical spine were also generated. COMPARISON:  01/09/2019 FINDINGS: CT HEAD FINDINGS Brain: No evidence of acute infarction, hemorrhage, hydrocephalus, extra-axial collection or mass lesion/mass effect. Periventricular white matter hypodensity. Vascular: No hyperdense vessel or unexpected calcification. Skull: Normal. Negative for fracture or focal lesion. Sinuses/Orbits: No  acute finding. Other: None. CT CERVICAL SPINE FINDINGS Alignment: Normal. Skull base and vertebrae: No acute fracture. No primary bone lesion or focal pathologic process. Soft tissues and spinal canal: No prevertebral fluid or swelling. No visible canal hematoma. Disc levels: Moderate multilevel disc space height loss and osteophytosis. Upper chest: Negative. Other: None. IMPRESSION: 1. No acute intracranial pathology. Small-vessel white matter disease. 2. No fracture or static subluxation of the cervical spine. Multilevel disc space height loss and osteophytosis. Electronically Signed   By: Eddie Candle M.D.   On: 06/21/2019 08:47   Ct Cervical Spine Wo Contrast  Result Date: 06/21/2019 CLINICAL DATA:  Fall and head trauma EXAM: CT HEAD WITHOUT CONTRAST CT CERVICAL SPINE WITHOUT CONTRAST TECHNIQUE: Multidetector CT imaging of the head and cervical spine was performed following the standard protocol without intravenous contrast. Multiplanar CT image reconstructions of the cervical spine were also generated. COMPARISON:  01/09/2019 FINDINGS:  CT HEAD FINDINGS Brain: No evidence of acute infarction, hemorrhage, hydrocephalus, extra-axial collection or mass lesion/mass effect. Periventricular white matter hypodensity. Vascular: No hyperdense vessel or unexpected calcification. Skull: Normal. Negative for fracture or focal lesion. Sinuses/Orbits: No acute finding. Other: None. CT CERVICAL SPINE FINDINGS Alignment: Normal. Skull base and vertebrae: No acute fracture. No primary bone lesion or focal pathologic process. Soft tissues and spinal canal: No prevertebral fluid or swelling. No visible canal hematoma. Disc levels: Moderate multilevel disc space height loss and osteophytosis. Upper chest: Negative. Other: None. IMPRESSION: 1. No acute intracranial pathology. Small-vessel white matter disease. 2. No fracture or static subluxation of the cervical spine. Multilevel disc space height loss and osteophytosis.  Electronically Signed   By: Eddie Candle M.D.   On: 06/21/2019 08:47   Dg Hip Unilat With Pelvis 2-3 Views Right  Result Date: 06/21/2019 CLINICAL DATA:  Fall today EXAM: DG HIP (WITH OR WITHOUT PELVIS) 2-3V RIGHT COMPARISON:  10/02/2015 FINDINGS: Negative for fracture. Normal right hip joint. Lumbar scoliosis and disc degeneration L4-5 and L5-S1. IMPRESSION: Negative for fracture. Electronically Signed   By: Franchot Gallo M.D.   On: 06/21/2019 09:01    Procedures .Marland KitchenLaceration Repair  Date/Time: 06/21/2019 9:21 AM Performed by: Lennice Sites, DO Authorized by: Lennice Sites, DO   Consent:    Consent obtained:  Verbal   Consent given by:  Patient   Risks discussed:  Infection, nerve damage, need for additional repair, pain, poor cosmetic result, poor wound healing, retained foreign body, tendon damage and vascular damage   Alternatives discussed:  No treatment Anesthesia (see MAR for exact dosages):    Anesthesia method:  None Laceration details:    Location:  Scalp   Scalp location:  R temporal   Length (cm):  3 Repair type:    Repair type:  Simple Pre-procedure details:    Preparation:  Patient was prepped and draped in usual sterile fashion Exploration:    Hemostasis achieved with:  Direct pressure   Wound exploration: wound explored through full range of motion and entire depth of wound probed and visualized     Wound extent: no fascia violation noted, no foreign bodies/material noted, no muscle damage noted, no nerve damage noted, no tendon damage noted, no underlying fracture noted and no vascular damage noted     Contaminated: no   Treatment:    Area cleansed with:  Saline   Amount of cleaning:  Standard   Irrigation solution:  Sterile saline   Irrigation method:  Pressure wash   Visualized foreign bodies/material removed: no   Skin repair:    Repair method:  Staples   Number of staples:  2 Approximation:    Approximation:  Close Post-procedure details:     Dressing:  Open (no dressing)   Patient tolerance of procedure:  Tolerated well, no immediate complications   (including critical care time)  Medications Ordered in ED Medications  sodium chloride 0.9 % bolus 500 mL (0 mLs Intravenous Stopped 06/21/19 0933)     Initial Impression / Assessment and Plan / ED Course  I have reviewed the triage vital signs and the nursing notes.  Pertinent labs & imaging results that were available during my care of the patient were reviewed by me and considered in my medical decision making (see chart for details).     SONIYA ASHRAF is an 85 year old female with history of orthopedic surgeries who presents to the ED after fall.  Patient states that she was using the bathroom  when she stood up she got briefly lightheaded and tripped and hit her head.  Has laceration to the right side of the scalp that is hemostatic.  It was repaired with 2 staples.  Patient neurologically intact.  Asymptomatic upon my evaluation.  States that she gets dizzy at times sometimes when she stands up too quickly.  Suspect this was a vasovagal/orthostatic issue today.  Patient complaining mostly of right hip pain.  X-rays of the right hip were unremarkable.  Chest x-ray normal.  CT scan of the head and neck unremarkable.  Lab work showed no significant leukocytosis, anemia, electrolyte abnormality.  Patient overall hemodynamically stable and asymptomatic throughout my care.  EKG unremarkable.  Patient given reassurance.  Given education about wound care.  Discharged in ED in good condition.  Understands return precautions.  This chart was dictated using voice recognition software.  Despite best efforts to proofread,  errors can occur which can change the documentation meaning.    Final Clinical Impressions(s) / ED Diagnoses   Final diagnoses:  Laceration of scalp without foreign body, initial encounter  Fall, initial encounter    ED Discharge Orders    None       Lennice Sites, DO 06/21/19 [phone]

## 2019-06-21 NOTE — Discharge Instructions (Addendum)
Have staples removed in 7 to 10 days.

## 2019-06-21 NOTE — ED Triage Notes (Signed)
Pt got up to urinate 5:30 this morning, got dizzy and fell and hit her head on the sink. Pt reports 2/10 pain in right hip as well as lac on right head. Denies LOC, neck or back pain.  EMS vitals 131/70 HR 64 RR 16 O2 96% RA T98.7 CBG 108

## 2019-06-24 ENCOUNTER — Telehealth: Payer: Self-pay

## 2019-06-24 NOTE — Telephone Encounter (Signed)
Covid testing order faxed to [facility].

## 2019-06-24 NOTE — Telephone Encounter (Signed)
Forms faxed to [facility].

## 2019-06-29 ENCOUNTER — Other Ambulatory Visit: Payer: Self-pay

## 2019-06-30 ENCOUNTER — Ambulatory Visit (INDEPENDENT_AMBULATORY_CARE_PROVIDER_SITE_OTHER): Payer: Medicare Other | Admitting: Family Medicine

## 2019-06-30 ENCOUNTER — Encounter: Payer: Self-pay | Admitting: Family Medicine

## 2019-06-30 VITALS — BP 156/70 | HR 70 | Temp 97.8°F | Resp 16 | Ht 62.5 in | Wt 144.0 lb

## 2019-06-30 DIAGNOSIS — Z4802 Encounter for removal of sutures: Secondary | ICD-10-CM

## 2019-06-30 DIAGNOSIS — L608 Other nail disorders: Secondary | ICD-10-CM | POA: Diagnosis not present

## 2019-06-30 NOTE — Progress Notes (Signed)
Subjective:    Patient ID: Hannah Galvan, female    DOB: [birth year], 85 y.o.   MRN: [id number]  HPI  July 28, the patient woke up and went to the bathroom.  When she stood up to get off the toilet, she lost her balance and fell striking her right temple against the sink.  She sustained a laceration to her right temple which required 2 staples in the emergency room.  She is here today for staple removal.  The wound appears to be healing well with no evidence of secondary cellulitis.  There is approximately a 3 cm laceration with a thick black scab over it with the skin edges approximated and closed with 2 staples equally spaced throughout the laceration.  Those 2 staples were removed without difficulty.  The patient also has dysmorphic thick yellow toenails which are long and causing her pain is a day again to the adjacent toenails.  She is asking if I would mind trimming her toenails. Past Medical History:  Diagnosis Date  . Allergy   . Anxiety   . Arthritis   . Blood transfusion without reported diagnosis   . Cancer ([place])    breast, s/p RXT, bilateral  . Cataract   . History of echocardiogram    a. Echo 8/16: Vigorous LVF, EF 92-42%, grade 1 diastolic dysfunction, trivial AI, mild MR, mild RVE, normal RV function, PASP 33 mmHg // b. Echo 1/17 EF 65-70%, normal wall motion, grade 1 diastolic dysfunction, trivial AI, MAC, mild MR // c. Echo 11/26/16: mild LVH, EF 60-65, mild AI, mild MR, mild LAE, mild to mod TR  . History of stress test    Lexiscan Myoview 8/16:  EF 81%, breast attenuation, no ischemia; Low Risk // Myoview 11/26/16: EF 75, apical defect c/w soft tissue attenuation, no ischemia or scar; Low Risk  . HOCM (hypertrophic obstructive cardiomyopathy) ([place])   . Hyperlipidemia   . Hypertension   . Hypothyroidism   . Shortness of breath   . Thyroid disease    Past Surgical History:  Procedure Laterality Date  . ABDOMINAL HYSTERECTOMY  2008  . bladder tact    . BREAST LUMPECTOMY  Right 2000  . BREAST LUMPECTOMY Left 2007  . CATARACT EXTRACTION  2011  . JOINT REPLACEMENT    . KNEE SURGERY     right  . ROTATOR CUFF REPAIR     right  . THYROID SURGERY  2002  . TOTAL KNEE ARTHROPLASTY Left 06/02/2013   Dr Rhona Raider  . TOTAL KNEE ARTHROPLASTY Right 06/02/2013   Procedure: RIGHT TOTAL KNEE ARTHROPLASTY;  Surgeon: Hessie Dibble, MD;  Location: [facility];  Service: Orthopedics;  Laterality: Right;  . TOTAL KNEE ARTHROPLASTY Left 08/22/2014   Procedure: TOTAL KNEE ARTHROPLASTY;  Surgeon: Hessie Dibble, MD;  Location: [facility];  Service: Orthopedics;  Laterality: Left;   Current Outpatient Medications on File Prior to Visit  Medication Sig Dispense Refill  . acetaminophen (TYLENOL) 325 MG tablet TAKE 1 TAB BY MOUTH EVERY 6 HOURS AS NEEDED FOR MILD PAIN (Patient taking differently: Take 325 mg by mouth every 6 (six) hours as needed for mild pain. ) 30 tablet 5  . albuterol (PROVENTIL HFA;VENTOLIN HFA) 108 (90 Base) MCG/ACT inhaler Inhale 2 puffs into the lungs every 4 (four) hours as needed for wheezing or shortness of breath. 1 Inhaler 0  . amLODipine (NORVASC) 5 MG tablet Take 1 tablet (5 mg total) by mouth daily. 90 tablet 3  . aspirin 81  MG tablet Take 81 mg by mouth daily.    . Calcium Carbonate-Vit D-Min (GNP CALCIUM 1200) 1200-1000 MG-UNIT CHEW 1 tab PO QAM (Patient taking differently: Chew 1 tablet by mouth. ) 30 tablet   . cholecalciferol (VITAMIN D) 1000 units tablet Take 1,000 Units by mouth daily.    . Cranberry 500 MG TABS Take 500 mg by mouth daily.     Marland Kitchen dextromethorphan-guaiFENesin (MUCINEX DM) 30-600 MG 12hr tablet Take 1 tablet by mouth 2 (two) times daily. 20 tablet 0  . donepezil (ARICEPT) 5 MG tablet Take 1 tablet (5 mg total) by mouth at bedtime. 90 tablet 3  . exemestane (AROMASIN) 25 MG tablet TAKE 1 TAB BY MOUTH EVERY DAY (Patient taking differently: Take 25 mg by mouth daily after breakfast. ) 30 tablet 10  . folic acid (FOLVITE) 1 MG tablet TAKE 1 TAB  BY MOUTH EVERY DAY 90 tablet 3  . hydrochlorothiazide (HYDRODIURIL) 25 MG tablet Take 1 tablet (25 mg total) by mouth daily. 90 tablet 3  . levofloxacin (LEVAQUIN) 500 MG tablet Take 1 tablet (500 mg total) by mouth daily. 7 tablet 0  . levothyroxine (SYNTHROID, LEVOTHROID) 125 MCG tablet TAKE 1 TAB BY MOUTH EVERY DAY 90 tablet 3  . LORazepam (ATIVAN) 1 MG tablet 1 tab po qhs 30 tablet 2  . losartan (COZAAR) 100 MG tablet TAKE 1 TAB BY MOUTH EVERY DAY FOR HTN 90 tablet 3  . metoprolol succinate (TOPROL-XL) 25 MG 24 hr tablet Take 1 tablet (25 mg total) by mouth daily. 90 tablet 3  . Multiple Vitamins-Minerals (CENTRUM SILVER ADULT 50+ PO) Take 1 tablet by mouth daily.     . nitroGLYCERIN (NITROSTAT) 0.4 MG SL tablet Place 1 tablet (0.4 mg total) under the tongue every 5 (five) minutes as needed for chest pain. 25 tablet 3  . potassium chloride SA (KLOR-CON M20) 20 MEQ tablet Take 1 tablet (20 mEq total) by mouth daily. 90 tablet 3  . rosuvastatin (CRESTOR) 10 MG tablet TAKE 1 TABLET BY MOUTH EVERY DAY AT 6PM 90 tablet 3  . sertraline (ZOLOFT) 100 MG tablet Take 1 tablet (100 mg total) by mouth daily. 90 tablet 3  . terbinafine (LAMISIL) 250 MG tablet Take 250 mg by mouth daily.    Marland Kitchen triamcinolone cream (KENALOG) 0.1 % Apply 1 application topically 2 (two) times daily. 30 g 0   No current facility-administered medications on file prior to visit.    Allergies  Allergen Reactions  . Ambien [Zolpidem]     confusion  . Pyridium [Phenazopyridine Hcl] Other (See Comments)    hemolysis  . Sulfa Antibiotics Rash   Social History   Socioeconomic History  . Marital status: Widowed    Spouse name: Not on file  . Number of children: Not on file  . Years of education: Not on file  . Highest education level: Not on file  Occupational History  . Not on file  Social Needs  . Financial resource strain: Not on file  . Food insecurity    Worry: Not on file    Inability: Not on file  .  Transportation needs    Medical: Not on file    Non-medical: Not on file  Tobacco Use  . Smoking status: Never Smoker  . Smokeless tobacco: Never Used  . Tobacco comment: never used tobacco  Substance and Sexual Activity  . Alcohol use: No    Alcohol/week: 0.0 standard drinks  . Drug use: No  . Sexual  activity: Not Currently  Lifestyle  . Physical activity    Days per week: Not on file    Minutes per session: Not on file  . Stress: Not on file  Relationships  . Social Herbalist on phone: Not on file    Gets together: Not on file    Attends religious service: Not on file    Active member of club or organization: Not on file    Attends meetings of clubs or organizations: Not on file    Relationship status: Not on file  . Intimate partner violence    Fear of current or ex partner: Not on file    Emotionally abused: Not on file    Physically abused: Not on file    Forced sexual activity: Not on file  Other Topics Concern  . Not on file  Social History Narrative  . Not on file     Review of Systems  All other systems reviewed and are negative.      Objective:   Physical Exam Constitutional:      General: She is not in acute distress.    Appearance: Normal appearance. She is normal weight. She is not ill-appearing or toxic-appearing.  HENT:     Head:   Cardiovascular:     Rate and Rhythm: Normal rate and regular rhythm.     Heart sounds: Normal heart sounds.  Pulmonary:     Effort: Pulmonary effort is normal.     Breath sounds: Normal breath sounds.  Neurological:     Mental Status: She is alert.           Assessment & Plan:  1. Encounter for staple removal 2 staples removed without difficulty 2. Acquired dysmorphic toenail Patient is missing the toenail on her right second and first toes.  However the other 3 toenails were trimmed using a pair of rongeurs.  All 5 toenails were trimmed on her left foot using a pair of rongeurs.  Patient  tolerated the procedure well without complication

## 2019-07-21 ENCOUNTER — Other Ambulatory Visit: Payer: Self-pay | Admitting: Family Medicine

## 2019-07-21 DIAGNOSIS — Z1231 Encounter for screening mammogram for malignant neoplasm of breast: Secondary | ICD-10-CM

## 2019-07-26 ENCOUNTER — Other Ambulatory Visit: Payer: Self-pay

## 2019-07-26 ENCOUNTER — Ambulatory Visit (INDEPENDENT_AMBULATORY_CARE_PROVIDER_SITE_OTHER): Payer: Medicare Other

## 2019-07-26 DIAGNOSIS — Z23 Encounter for immunization: Secondary | ICD-10-CM | POA: Diagnosis not present

## 2019-07-26 NOTE — Progress Notes (Signed)
Patient came in today to receive her annual flu vaccine. Patient was given fluad in the left deltoid. She tolerated well. VIS was given.

## 2019-08-16 ENCOUNTER — Other Ambulatory Visit: Payer: Self-pay | Admitting: Family Medicine

## 2019-08-16 MED ORDER — LORAZEPAM 1 MG PO TABS: ORAL_TABLET | ORAL | 2 refills | Status: DC

## 2019-08-16 NOTE — Telephone Encounter (Signed)
Requesting refill    Lorazepam  LOV: 06/30/2019  LRF: 02/21/2019     Call [facility] When completed [phone]

## 2019-08-30 ENCOUNTER — Ambulatory Visit: Payer: Medicare Other | Admitting: Family Medicine

## 2019-09-01 ENCOUNTER — Ambulatory Visit (INDEPENDENT_AMBULATORY_CARE_PROVIDER_SITE_OTHER): Payer: Medicare Other | Admitting: Family Medicine

## 2019-09-01 ENCOUNTER — Encounter: Payer: Self-pay | Admitting: Family Medicine

## 2019-09-01 VITALS — BP 140/68 | HR 68 | Temp 97.8°F | Resp 12 | Ht 62.5 in | Wt 144.0 lb

## 2019-09-01 DIAGNOSIS — F0391 Unspecified dementia with behavioral disturbance: Secondary | ICD-10-CM | POA: Diagnosis not present

## 2019-09-01 MED ORDER — ARIPIPRAZOLE 5 MG PO TABS: 5.0000 mg | ORAL_TABLET | Freq: Every day | ORAL | 3 refills | Status: DC

## 2019-09-01 NOTE — Progress Notes (Signed)
Subjective:    Patient ID: Hannah Galvan, female    DOB: [birth year], 85 y.o.   MRN: [id number]  HPI Patient presents today with her son.  She is currently a resident in a skilled nursing facility.  He states that ever since she has been there for the last 5 months, her dementia is worsening and she is having increasing hallucinations and delusions.  This is primarily been over the last 5 months where she has been isolated in the nursing home due to the COVID-19 pandemic and quarantine.  For instance, every day she sees her reflection in the mirror and she believes it is another person imitating her.  This makes her mad and upset.  For instance, the patient states that this woman constantly copies her.  She will brush her teeth whenever the patient brushes her teeth.  She will comb her hair whenever the patient combs her hair.  The patient shows sympathy towards her and thinks that she is just lonely and wants attention however this is obviously disturbing to her.  They have covered the mirrors with blankets however then the patient feels that she is being punished and that she is not in her room.  Still complains of grip under her fingernails when no grit is present.  She believes the grip covers all the close in her closet.  It falls out at the ceiling and on her floor.  It covers her food and gets in her airways.  She is constantly picking at her skin trying to remove the grip.  She is constantly fidgeting.  She is also very depressed and complains every day about being lonely and wanting to leave.  She is already on sertraline for depression.  However the delusions and hallucinations seem to be worsening.  These have been present for 5 months making acute delirium unlikely Past Medical History:  Diagnosis Date  . Allergy   . Anxiety   . Arthritis   . Blood transfusion without reported diagnosis   . Cancer ([place])    breast, s/p RXT, bilateral  . Cataract   . History of echocardiogram    a. Echo  8/16: Vigorous LVF, EF 78-29%, grade 1 diastolic dysfunction, trivial AI, mild MR, mild RVE, normal RV function, PASP 33 mmHg // b. Echo 1/17 EF 65-70%, normal wall motion, grade 1 diastolic dysfunction, trivial AI, MAC, mild MR // c. Echo 11/26/16: mild LVH, EF 60-65, mild AI, mild MR, mild LAE, mild to mod TR  . History of stress test    Lexiscan Myoview 8/16:  EF 81%, breast attenuation, no ischemia; Low Risk // Myoview 11/26/16: EF 75, apical defect c/w soft tissue attenuation, no ischemia or scar; Low Risk  . HOCM (hypertrophic obstructive cardiomyopathy) ([place])   . Hyperlipidemia   . Hypertension   . Hypothyroidism   . Shortness of breath   . Thyroid disease    Past Surgical History:  Procedure Laterality Date  . ABDOMINAL HYSTERECTOMY  2008  . bladder tact    . BREAST LUMPECTOMY Right 2000  . BREAST LUMPECTOMY Left 2007  . CATARACT EXTRACTION  2011  . JOINT REPLACEMENT    . KNEE SURGERY     right  . ROTATOR CUFF REPAIR     right  . THYROID SURGERY  2002  . TOTAL KNEE ARTHROPLASTY Left 06/02/2013   Dr Rhona Raider  . TOTAL KNEE ARTHROPLASTY Right 06/02/2013   Procedure: RIGHT TOTAL KNEE ARTHROPLASTY;  Surgeon: Hessie Dibble, MD;  Location:  [facility];  Service: Orthopedics;  Laterality: Right;  . TOTAL KNEE ARTHROPLASTY Left 08/22/2014   Procedure: TOTAL KNEE ARTHROPLASTY;  Surgeon: Hessie Dibble, MD;  Location: [facility];  Service: Orthopedics;  Laterality: Left;   Current Outpatient Medications on File Prior to Visit  Medication Sig Dispense Refill  . acetaminophen (TYLENOL) 325 MG tablet TAKE 1 TAB BY MOUTH EVERY 6 HOURS AS NEEDED FOR MILD PAIN (Patient taking differently: Take 325 mg by mouth every 6 (six) hours as needed for mild pain. ) 30 tablet 5  . albuterol (PROVENTIL HFA;VENTOLIN HFA) 108 (90 Base) MCG/ACT inhaler Inhale 2 puffs into the lungs every 4 (four) hours as needed for wheezing or shortness of breath. 1 Inhaler 0  . amLODipine (NORVASC) 5 MG tablet Take 1 tablet (5 mg  total) by mouth daily. 90 tablet 3  . aspirin 81 MG tablet Take 81 mg by mouth daily.    . Calcium Carbonate-Vit D-Min (GNP CALCIUM 1200) 1200-1000 MG-UNIT CHEW 1 tab PO QAM (Patient taking differently: Chew 1 tablet by mouth. ) 30 tablet   . cholecalciferol (VITAMIN D) 1000 units tablet Take 1,000 Units by mouth daily.    . Cranberry 500 MG TABS Take 500 mg by mouth daily.     Marland Kitchen dextromethorphan-guaiFENesin (MUCINEX DM) 30-600 MG 12hr tablet Take 1 tablet by mouth 2 (two) times daily. 20 tablet 0  . donepezil (ARICEPT) 5 MG tablet Take 1 tablet (5 mg total) by mouth at bedtime. 90 tablet 3  . exemestane (AROMASIN) 25 MG tablet TAKE 1 TAB BY MOUTH EVERY DAY (Patient taking differently: Take 25 mg by mouth daily after breakfast. ) 30 tablet 10  . folic acid (FOLVITE) 1 MG tablet TAKE 1 TAB BY MOUTH EVERY DAY 90 tablet 3  . hydrochlorothiazide (HYDRODIURIL) 25 MG tablet Take 1 tablet (25 mg total) by mouth daily. 90 tablet 3  . levothyroxine (SYNTHROID, LEVOTHROID) 125 MCG tablet TAKE 1 TAB BY MOUTH EVERY DAY 90 tablet 3  . LORazepam (ATIVAN) 1 MG tablet 1 tab po qhs 30 tablet 2  . losartan (COZAAR) 100 MG tablet TAKE 1 TAB BY MOUTH EVERY DAY FOR HTN 90 tablet 3  . metoprolol succinate (TOPROL-XL) 25 MG 24 hr tablet Take 1 tablet (25 mg total) by mouth daily. 90 tablet 3  . Multiple Vitamins-Minerals (CENTRUM SILVER ADULT 50+ PO) Take 1 tablet by mouth daily.     . nitroGLYCERIN (NITROSTAT) 0.4 MG SL tablet Place 1 tablet (0.4 mg total) under the tongue every 5 (five) minutes as needed for chest pain. 25 tablet 3  . potassium chloride SA (KLOR-CON M20) 20 MEQ tablet Take 1 tablet (20 mEq total) by mouth daily. 90 tablet 3  . rosuvastatin (CRESTOR) 10 MG tablet TAKE 1 TABLET BY MOUTH EVERY DAY AT 6PM 90 tablet 3  . sertraline (ZOLOFT) 100 MG tablet Take 1 tablet (100 mg total) by mouth daily. 90 tablet 3  . triamcinolone cream (KENALOG) 0.1 % Apply 1 application topically 2 (two) times daily. 30 g 0    No current facility-administered medications on file prior to visit.    Allergies  Allergen Reactions  . Ambien [Zolpidem]     confusion  . Pyridium [Phenazopyridine Hcl] Other (See Comments)    hemolysis  . Sulfa Antibiotics Rash   Social History   Socioeconomic History  . Marital status: Widowed    Spouse name: Not on file  . Number of children: Not on file  . Years of  education: Not on file  . Highest education level: Not on file  Occupational History  . Not on file  Social Needs  . Financial resource strain: Not on file  . Food insecurity    Worry: Not on file    Inability: Not on file  . Transportation needs    Medical: Not on file    Non-medical: Not on file  Tobacco Use  . Smoking status: Never Smoker  . Smokeless tobacco: Never Used  . Tobacco comment: never used tobacco  Substance and Sexual Activity  . Alcohol use: No    Alcohol/week: 0.0 standard drinks  . Drug use: No  . Sexual activity: Not Currently  Lifestyle  . Physical activity    Days per week: Not on file    Minutes per session: Not on file  . Stress: Not on file  Relationships  . Social Herbalist on phone: Not on file    Gets together: Not on file    Attends religious service: Not on file    Active member of club or organization: Not on file    Attends meetings of clubs or organizations: Not on file    Relationship status: Not on file  . Intimate partner violence    Fear of current or ex partner: Not on file    Emotionally abused: Not on file    Physically abused: Not on file    Forced sexual activity: Not on file  Other Topics Concern  . Not on file  Social History Narrative  . Not on file      Review of Systems  All other systems reviewed and are negative.      Objective:   Physical Exam Vitals signs reviewed.  Constitutional:      General: She is not in acute distress.    Appearance: Normal appearance. She is not ill-appearing or toxic-appearing.   Cardiovascular:     Rate and Rhythm: Normal rate and regular rhythm.     Heart sounds: Normal heart sounds.  Pulmonary:     Effort: Pulmonary effort is normal.     Breath sounds: Normal breath sounds.  Neurological:     General: No focal deficit present.     Mental Status: She is alert. Mental status is at baseline.     Cranial Nerves: No cranial nerve deficit.     Sensory: No sensory deficit.     Motor: No weakness.     Coordination: Coordination normal.     Gait: Gait normal.     Deep Tendon Reflexes: Reflexes normal.  Psychiatric:        Attention and Perception: Attention normal. She perceives visual hallucinations.        Mood and Affect: Mood normal.        Speech: Speech normal.        Behavior: Behavior normal. Behavior is not agitated, slowed, aggressive, withdrawn, hyperactive or combative. Behavior is cooperative.        Thought Content: Thought content is delusional.        Cognition and Memory: Memory is not impaired. She does not exhibit impaired recent memory.           Assessment & Plan:  Dementia with behavioral disturbance, unspecified dementia type ([place])  I believe some of this is due to her underlying dementia and some may be due to depression and being isolated and under constant quarantine essentially locked away in her room per her son's report recommend continuing  Zoloft but adding Abilify 5 mg a day and then reassessing via telephone in 2 to 3 weeks or sooner if worsening.

## 2019-09-06 ENCOUNTER — Other Ambulatory Visit: Payer: Self-pay

## 2019-09-06 ENCOUNTER — Ambulatory Visit: Payer: Medicare Other

## 2019-09-06 ENCOUNTER — Ambulatory Visit
Admission: RE | Admit: 2019-09-06 | Discharge: 2019-09-06 | Disposition: A | Discharge status: Home / Self Care | Payer: Medicare Other | Source: Ambulatory Visit | Attending: Family Medicine | Admitting: Family Medicine

## 2019-09-06 DIAGNOSIS — Z1231 Encounter for screening mammogram for malignant neoplasm of breast: Secondary | ICD-10-CM

## 2019-09-06 HISTORY — DX: Personal history of irradiation: Z92.3

## 2019-09-06 HISTORY — DX: Malignant neoplasm of unspecified site of unspecified female breast: C50.919

## 2019-09-09 ENCOUNTER — Telehealth: Payer: Self-pay

## 2019-09-09 NOTE — Telephone Encounter (Signed)
Pt's son called to report that pt is dozing quite often on the 5 mg ambilify and wants to know if you can cut down to 2.5 mg? Son also states that does not seem to be very coherent on the 5 mg. Please advise.

## 2019-09-09 NOTE — Telephone Encounter (Signed)
Definitely too much. Reduce dose to 2 mg a day immediately.

## 2019-09-12 ENCOUNTER — Other Ambulatory Visit: Payer: Self-pay | Admitting: Family Medicine

## 2019-09-12 NOTE — Telephone Encounter (Signed)
[facility] needs a new Rx faxed over.

## 2019-09-13 ENCOUNTER — Other Ambulatory Visit: Payer: Self-pay

## 2019-09-13 MED ORDER — ARIPIPRAZOLE 2 MG PO TABS: 2.0000 mg | ORAL_TABLET | Freq: Every day | ORAL | 2 refills | Status: AC

## 2019-09-13 NOTE — Telephone Encounter (Signed)
I sent it to [facility]

## 2019-09-13 NOTE — Telephone Encounter (Signed)
[facility] but send to what pharmacy?  I don't know where to send it.

## 2019-10-11 ENCOUNTER — Inpatient Hospital Stay (HOSPITAL_COMMUNITY): Payer: Medicare Other

## 2019-10-11 ENCOUNTER — Inpatient Hospital Stay (HOSPITAL_COMMUNITY)
Admission: EM | Admit: 2019-10-11 | Discharge: 2019-10-25 | DRG: 871 | Disposition: A | Discharge status: Skilled Nursing Facility | Payer: Medicare Other | Attending: Family Medicine | Admitting: Family Medicine

## 2019-10-11 ENCOUNTER — Emergency Department (HOSPITAL_COMMUNITY): Payer: Medicare Other

## 2019-10-11 ENCOUNTER — Encounter (HOSPITAL_COMMUNITY): Payer: Self-pay

## 2019-10-11 ENCOUNTER — Other Ambulatory Visit: Payer: Self-pay

## 2019-10-11 DIAGNOSIS — Z978 Presence of other specified devices: Secondary | ICD-10-CM

## 2019-10-11 DIAGNOSIS — I248 Other forms of acute ischemic heart disease: Secondary | ICD-10-CM | POA: Diagnosis present

## 2019-10-11 DIAGNOSIS — I421 Obstructive hypertrophic cardiomyopathy: Secondary | ICD-10-CM | POA: Diagnosis present

## 2019-10-11 DIAGNOSIS — I519 Heart disease, unspecified: Secondary | ICD-10-CM | POA: Diagnosis not present

## 2019-10-11 DIAGNOSIS — I340 Nonrheumatic mitral (valve) insufficiency: Secondary | ICD-10-CM | POA: Diagnosis not present

## 2019-10-11 DIAGNOSIS — Z882 Allergy status to sulfonamides status: Secondary | ICD-10-CM

## 2019-10-11 DIAGNOSIS — E039 Hypothyroidism, unspecified: Secondary | ICD-10-CM | POA: Diagnosis present

## 2019-10-11 DIAGNOSIS — I447 Left bundle-branch block, unspecified: Secondary | ICD-10-CM | POA: Diagnosis present

## 2019-10-11 DIAGNOSIS — N170 Acute kidney failure with tubular necrosis: Secondary | ICD-10-CM | POA: Diagnosis present

## 2019-10-11 DIAGNOSIS — J9601 Acute respiratory failure with hypoxia: Secondary | ICD-10-CM | POA: Diagnosis present

## 2019-10-11 DIAGNOSIS — Z7982 Long term (current) use of aspirin: Secondary | ICD-10-CM

## 2019-10-11 DIAGNOSIS — Z90710 Acquired absence of both cervix and uterus: Secondary | ICD-10-CM

## 2019-10-11 DIAGNOSIS — Z66 Do not resuscitate: Secondary | ICD-10-CM | POA: Diagnosis present

## 2019-10-11 DIAGNOSIS — N1831 Chronic kidney disease, stage 3a: Secondary | ICD-10-CM | POA: Diagnosis present

## 2019-10-11 DIAGNOSIS — R1310 Dysphagia, unspecified: Secondary | ICD-10-CM | POA: Diagnosis present

## 2019-10-11 DIAGNOSIS — F419 Anxiety disorder, unspecified: Secondary | ICD-10-CM | POA: Diagnosis present

## 2019-10-11 DIAGNOSIS — G309 Alzheimer's disease, unspecified: Secondary | ICD-10-CM | POA: Diagnosis present

## 2019-10-11 DIAGNOSIS — J1289 Other viral pneumonia: Secondary | ICD-10-CM | POA: Diagnosis present

## 2019-10-11 DIAGNOSIS — I130 Hypertensive heart and chronic kidney disease with heart failure and stage 1 through stage 4 chronic kidney disease, or unspecified chronic kidney disease: Secondary | ICD-10-CM | POA: Diagnosis present

## 2019-10-11 DIAGNOSIS — R197 Diarrhea, unspecified: Secondary | ICD-10-CM | POA: Diagnosis not present

## 2019-10-11 DIAGNOSIS — Z8249 Family history of ischemic heart disease and other diseases of the circulatory system: Secondary | ICD-10-CM

## 2019-10-11 DIAGNOSIS — J189 Pneumonia, unspecified organism: Secondary | ICD-10-CM

## 2019-10-11 DIAGNOSIS — U071 COVID-19: Secondary | ICD-10-CM | POA: Diagnosis present

## 2019-10-11 DIAGNOSIS — I5021 Acute systolic (congestive) heart failure: Secondary | ICD-10-CM | POA: Diagnosis not present

## 2019-10-11 DIAGNOSIS — R471 Dysarthria and anarthria: Secondary | ICD-10-CM | POA: Diagnosis present

## 2019-10-11 DIAGNOSIS — K219 Gastro-esophageal reflux disease without esophagitis: Secondary | ICD-10-CM | POA: Diagnosis present

## 2019-10-11 DIAGNOSIS — Z79899 Other long term (current) drug therapy: Secondary | ICD-10-CM

## 2019-10-11 DIAGNOSIS — J690 Pneumonitis due to inhalation of food and vomit: Secondary | ICD-10-CM | POA: Diagnosis present

## 2019-10-11 DIAGNOSIS — A4189 Other specified sepsis: Principal | ICD-10-CM | POA: Diagnosis present

## 2019-10-11 DIAGNOSIS — R451 Restlessness and agitation: Secondary | ICD-10-CM | POA: Diagnosis not present

## 2019-10-11 DIAGNOSIS — J302 Other seasonal allergic rhinitis: Secondary | ICD-10-CM | POA: Diagnosis present

## 2019-10-11 DIAGNOSIS — I272 Pulmonary hypertension, unspecified: Secondary | ICD-10-CM | POA: Diagnosis present

## 2019-10-11 DIAGNOSIS — Z923 Personal history of irradiation: Secondary | ICD-10-CM

## 2019-10-11 DIAGNOSIS — Z7189 Other specified counseling: Secondary | ICD-10-CM | POA: Diagnosis not present

## 2019-10-11 DIAGNOSIS — R911 Solitary pulmonary nodule: Secondary | ICD-10-CM | POA: Diagnosis present

## 2019-10-11 DIAGNOSIS — G301 Alzheimer's disease with late onset: Secondary | ICD-10-CM | POA: Diagnosis not present

## 2019-10-11 DIAGNOSIS — I5023 Acute on chronic systolic (congestive) heart failure: Secondary | ICD-10-CM | POA: Diagnosis present

## 2019-10-11 DIAGNOSIS — F0280 Dementia in other diseases classified elsewhere without behavioral disturbance: Secondary | ICD-10-CM | POA: Diagnosis present

## 2019-10-11 DIAGNOSIS — Z9911 Dependence on respirator [ventilator] status: Secondary | ICD-10-CM | POA: Diagnosis not present

## 2019-10-11 DIAGNOSIS — R5381 Other malaise: Secondary | ICD-10-CM | POA: Diagnosis not present

## 2019-10-11 DIAGNOSIS — R6521 Severe sepsis with septic shock: Secondary | ICD-10-CM | POA: Diagnosis present

## 2019-10-11 DIAGNOSIS — E785 Hyperlipidemia, unspecified: Secondary | ICD-10-CM | POA: Diagnosis present

## 2019-10-11 DIAGNOSIS — I5020 Unspecified systolic (congestive) heart failure: Secondary | ICD-10-CM

## 2019-10-11 DIAGNOSIS — E876 Hypokalemia: Secondary | ICD-10-CM

## 2019-10-11 DIAGNOSIS — I081 Rheumatic disorders of both mitral and tricuspid valves: Secondary | ICD-10-CM | POA: Diagnosis present

## 2019-10-11 DIAGNOSIS — E872 Acidosis: Secondary | ICD-10-CM | POA: Diagnosis present

## 2019-10-11 DIAGNOSIS — I361 Nonrheumatic tricuspid (valve) insufficiency: Secondary | ICD-10-CM | POA: Diagnosis not present

## 2019-10-11 DIAGNOSIS — Z96653 Presence of artificial knee joint, bilateral: Secondary | ICD-10-CM | POA: Diagnosis present

## 2019-10-11 DIAGNOSIS — Z0184 Encounter for antibody response examination: Secondary | ICD-10-CM | POA: Diagnosis not present

## 2019-10-11 DIAGNOSIS — A419 Sepsis, unspecified organism: Secondary | ICD-10-CM | POA: Diagnosis not present

## 2019-10-11 DIAGNOSIS — F329 Major depressive disorder, single episode, unspecified: Secondary | ICD-10-CM | POA: Diagnosis present

## 2019-10-11 DIAGNOSIS — Z79890 Hormone replacement therapy (postmenopausal): Secondary | ICD-10-CM

## 2019-10-11 DIAGNOSIS — Z20822 Contact with and (suspected) exposure to covid-19: Secondary | ICD-10-CM

## 2019-10-11 DIAGNOSIS — Z01818 Encounter for other preprocedural examination: Secondary | ICD-10-CM

## 2019-10-11 DIAGNOSIS — Z853 Personal history of malignant neoplasm of breast: Secondary | ICD-10-CM

## 2019-10-11 DIAGNOSIS — Z888 Allergy status to other drugs, medicaments and biological substances status: Secondary | ICD-10-CM

## 2019-10-11 HISTORY — DX: Unspecified dementia, unspecified severity, without behavioral disturbance, psychotic disturbance, mood disturbance, and anxiety: F03.90

## 2019-10-11 HISTORY — DX: Disorientation, unspecified: R41.0

## 2019-10-11 HISTORY — DX: Chronic kidney disease, stage 3 unspecified: N18.30

## 2019-10-11 HISTORY — DX: Atrial premature depolarization: I49.1

## 2019-10-11 LAB — COMPREHENSIVE METABOLIC PANEL
ALT: 22 U/L (ref 0–44)
AST: 46 U/L — ABNORMAL HIGH (ref 15–41)
Albumin: 3.1 g/dL — ABNORMAL LOW (ref 3.5–5.0)
Alkaline Phosphatase: 38 U/L (ref 38–126)
Anion gap: 10 (ref 5–15)
BUN: 21 mg/dL (ref 8–23)
CO2: 20 mmol/L — ABNORMAL LOW (ref 22–32)
Calcium: 7.6 mg/dL — ABNORMAL LOW (ref 8.9–10.3)
Chloride: 110 mmol/L (ref 98–111)
Creatinine, Ser: 1.01 mg/dL — ABNORMAL HIGH (ref 0.44–1.00)
GFR calc Af Amer: 58 mL/min — ABNORMAL LOW (ref >60)
GFR calc non Af Amer: 50 mL/min — ABNORMAL LOW (ref >60)
Glucose, Bld: 160 mg/dL — ABNORMAL HIGH (ref 70–99)
Potassium: 3.6 mmol/L (ref 3.5–5.1)
Sodium: 140 mmol/L (ref 135–145)
Total Bilirubin: 1.1 mg/dL (ref 0.3–1.2)
Total Protein: 5.3 g/dL — ABNORMAL LOW (ref 6.5–8.1)

## 2019-10-11 LAB — BASIC METABOLIC PANEL
Anion gap: 14 (ref 5–15)
BUN: 21 mg/dL (ref 8–23)
CO2: 21 mmol/L — ABNORMAL LOW (ref 22–32)
Calcium: 9.1 mg/dL (ref 8.9–10.3)
Chloride: 104 mmol/L (ref 98–111)
Creatinine, Ser: 1.13 mg/dL — ABNORMAL HIGH (ref 0.44–1.00)
GFR calc Af Amer: 51 mL/min — ABNORMAL LOW (ref >60)
GFR calc non Af Amer: 44 mL/min — ABNORMAL LOW (ref >60)
Glucose, Bld: 196 mg/dL — ABNORMAL HIGH (ref 70–99)
Potassium: 3.1 mmol/L — ABNORMAL LOW (ref 3.5–5.1)
Sodium: 139 mmol/L (ref 135–145)

## 2019-10-11 LAB — CBC WITH DIFFERENTIAL/PLATELET
Abs Immature Granulocytes: 0.1 10*3/uL — ABNORMAL HIGH (ref 0.00–0.07)
Basophils Absolute: 0.1 10*3/uL (ref 0.0–0.1)
Basophils Relative: 0 %
Eosinophils Absolute: 0 10*3/uL (ref 0.0–0.5)
Eosinophils Relative: 0 %
HCT: 49.6 % — ABNORMAL HIGH (ref 36.0–46.0)
Hemoglobin: 16.3 g/dL — ABNORMAL HIGH (ref 12.0–15.0)
Immature Granulocytes: 1 %
Lymphocytes Relative: 7 %
Lymphs Abs: 1.3 10*3/uL (ref 0.7–4.0)
MCH: 31.8 pg (ref 26.0–34.0)
MCHC: 32.9 g/dL (ref 30.0–36.0)
MCV: 96.7 fL (ref 80.0–100.0)
Monocytes Absolute: 1.2 10*3/uL — ABNORMAL HIGH (ref 0.1–1.0)
Monocytes Relative: 6 %
Neutro Abs: 16 10*3/uL — ABNORMAL HIGH (ref 1.7–7.7)
Neutrophils Relative %: 86 %
Platelets: 185 10*3/uL (ref 150–400)
RBC: 5.13 MIL/uL — ABNORMAL HIGH (ref 3.87–5.11)
RDW: 12.8 % (ref 11.5–15.5)
WBC: 18.6 10*3/uL — ABNORMAL HIGH (ref 4.0–10.5)
nRBC: 0 % (ref 0.0–0.2)

## 2019-10-11 LAB — RESPIRATORY PANEL BY PCR

## 2019-10-11 LAB — BRAIN NATRIURETIC PEPTIDE: B Natriuretic Peptide: 1074.1 pg/mL — ABNORMAL HIGH (ref 0.0–100.0)

## 2019-10-11 LAB — POCT I-STAT 7, (LYTES, BLD GAS, ICA,H+H)
Acid-base deficit: 5 mmol/L — ABNORMAL HIGH (ref 0.0–2.0)
Acid-base deficit: 6 mmol/L — ABNORMAL HIGH (ref 0.0–2.0)
Acid-base deficit: 7 mmol/L — ABNORMAL HIGH (ref 0.0–2.0)
Bicarbonate: 18.5 mmol/L — ABNORMAL LOW (ref 20.0–28.0)
Bicarbonate: 19.1 mmol/L — ABNORMAL LOW (ref 20.0–28.0)
Bicarbonate: 19.3 mmol/L — ABNORMAL LOW (ref 20.0–28.0)
Calcium, Ion: 1.15 mmol/L (ref 1.15–1.40)
Calcium, Ion: 1.17 mmol/L (ref 1.15–1.40)
Calcium, Ion: 1.2 mmol/L (ref 1.15–1.40)
HCT: 36 % (ref 36.0–46.0)
HCT: 37 % (ref 36.0–46.0)
HCT: 45 % (ref 36.0–46.0)
Hemoglobin: 12.2 g/dL (ref 12.0–15.0)
Hemoglobin: 12.6 g/dL (ref 12.0–15.0)
Hemoglobin: 15.3 g/dL — ABNORMAL HIGH (ref 12.0–15.0)
O2 Saturation: 90 %
O2 Saturation: 96 %
O2 Saturation: 98 %
Patient temperature: 100.8
Patient temperature: 100.8
Patient temperature: 98
Potassium: 2.7 mmol/L — CL (ref 3.5–5.1)
Potassium: 3.4 mmol/L — ABNORMAL LOW (ref 3.5–5.1)
Potassium: 4.5 mmol/L (ref 3.5–5.1)
Sodium: 138 mmol/L (ref 135–145)
Sodium: 140 mmol/L (ref 135–145)
Sodium: 141 mmol/L (ref 135–145)
TCO2: 19 mmol/L — ABNORMAL LOW (ref 22–32)
TCO2: 20 mmol/L — ABNORMAL LOW (ref 22–32)
TCO2: 21 mmol/L — ABNORMAL LOW (ref 22–32)
pCO2 arterial: 30.9 mmHg — ABNORMAL LOW (ref 32.0–48.0)
pCO2 arterial: 39.6 mmHg (ref 32.0–48.0)
pCO2 arterial: 42.6 mmHg (ref 32.0–48.0)
pH, Arterial: 7.27 — ABNORMAL LOW (ref 7.350–7.450)
pH, Arterial: 7.298 — ABNORMAL LOW (ref 7.350–7.450)
pH, Arterial: 7.384 (ref 7.350–7.450)
pO2, Arterial: 103 mmHg (ref 83.0–108.0)
pO2, Arterial: 121 mmHg — ABNORMAL HIGH (ref 83.0–108.0)
pO2, Arterial: 58 mmHg — ABNORMAL LOW (ref 83.0–108.0)

## 2019-10-11 LAB — PHOSPHORUS: Phosphorus: 3.2 mg/dL (ref 2.5–4.6)

## 2019-10-11 LAB — CBC
HCT: 41.8 % (ref 36.0–46.0)
Hemoglobin: 13.6 g/dL (ref 12.0–15.0)
MCH: 32.1 pg (ref 26.0–34.0)
MCHC: 32.5 g/dL (ref 30.0–36.0)
MCV: 98.6 fL (ref 80.0–100.0)
Platelets: 146 10*3/uL — ABNORMAL LOW (ref 150–400)
RBC: 4.24 MIL/uL (ref 3.87–5.11)
RDW: 13.1 % (ref 11.5–15.5)
WBC: 15.3 10*3/uL — ABNORMAL HIGH (ref 4.0–10.5)
nRBC: 0 % (ref 0.0–0.2)

## 2019-10-11 LAB — LACTIC ACID, PLASMA
Lactic Acid, Venous: 3 mmol/L (ref 0.5–1.9)
Lactic Acid, Venous: 2.4 mmol/L (ref 0.5–1.9)

## 2019-10-11 LAB — GLUCOSE, CAPILLARY
Glucose-Capillary: 150 mg/dL — ABNORMAL HIGH (ref 70–99)
Glucose-Capillary: 112 mg/dL — ABNORMAL HIGH (ref 70–99)
Glucose-Capillary: 138 mg/dL — ABNORMAL HIGH (ref 70–99)
Glucose-Capillary: 128 mg/dL — ABNORMAL HIGH (ref 70–99)

## 2019-10-11 LAB — ECHOCARDIOGRAM COMPLETE
Height: 62.5 in
Weight: 2303.37 oz

## 2019-10-11 LAB — PROTIME-INR
INR: 1.3 — ABNORMAL HIGH (ref 0.8–1.2)
Prothrombin Time: 16.4 seconds — ABNORMAL HIGH (ref 11.4–15.2)

## 2019-10-11 LAB — APTT: aPTT: 26 seconds (ref 24–36)

## 2019-10-11 LAB — PROCALCITONIN: Procalcitonin: 0.15 ng/mL

## 2019-10-11 LAB — TROPONIN I (HIGH SENSITIVITY)
Troponin I (High Sensitivity): 960 ng/L (ref <18)
Troponin I (High Sensitivity): 1191 ng/L (ref <18)

## 2019-10-11 LAB — MAGNESIUM: Magnesium: 2.3 mg/dL (ref 1.7–2.4)

## 2019-10-11 LAB — MRSA PCR SCREENING: MRSA by PCR: NEGATIVE

## 2019-10-11 LAB — HEPARIN LEVEL (UNFRACTIONATED): Heparin Unfractionated: 0.81 IU/mL — ABNORMAL HIGH (ref 0.30–0.70)

## 2019-10-11 LAB — SARS CORONAVIRUS 2 BY RT PCR (HOSPITAL ORDER, PERFORMED IN ~~LOC~~ HOSPITAL LAB): SARS Coronavirus 2: NEGATIVE

## 2019-10-11 MED ORDER — FENTANYL CITRATE (PF) 100 MCG/2ML IJ SOLN
50.0000 ug | INTRAMUSCULAR | Status: DC | PRN
  Administered 2019-10-11 – 2019-10-13 (×3): 50 ug via INTRAVENOUS
  Filled 2019-10-11 (×2): qty 2

## 2019-10-11 MED ORDER — SODIUM CHLORIDE 0.9 % IV BOLUS (SEPSIS)
1000.0000 mL | Freq: Once | INTRAVENOUS | Status: AC
  Administered 2019-10-11: 1000 mL via INTRAVENOUS

## 2019-10-11 MED ORDER — MIDAZOLAM HCL 2 MG/2ML IJ SOLN
INTRAMUSCULAR | Status: AC
  Filled 2019-10-11: qty 2

## 2019-10-11 MED ORDER — HEPARIN (PORCINE) 25000 UT/250ML-% IV SOLN
850.0000 [IU]/h | INTRAVENOUS | Status: DC
  Administered 2019-10-11: 750 [IU]/h via INTRAVENOUS
  Administered 2019-10-12: 18:00:00 650 [IU]/h via INTRAVENOUS
  Administered 2019-10-14 – 2019-10-16 (×3): 850 [IU]/h via INTRAVENOUS
  Filled 2019-10-11 (×5): qty 250

## 2019-10-11 MED ORDER — SODIUM CHLORIDE 0.9 % IV BOLUS
250.0000 mL | Freq: Once | INTRAVENOUS | Status: AC
  Administered 2019-10-11: 23:00:00 250 mL via INTRAVENOUS

## 2019-10-11 MED ORDER — FENTANYL CITRATE (PF) 100 MCG/2ML IJ SOLN
INTRAMUSCULAR | Status: AC
  Filled 2019-10-11: qty 2

## 2019-10-11 MED ORDER — LACTATED RINGERS IV BOLUS
500.0000 mL | Freq: Once | INTRAVENOUS | Status: AC
  Administered 2019-10-11: 500 mL via INTRAVENOUS

## 2019-10-11 MED ORDER — SODIUM CHLORIDE 0.9 % IV SOLN
2.0000 g | Freq: Two times a day (BID) | INTRAVENOUS | Status: AC
  Administered 2019-10-11 – 2019-10-14 (×7): 2 g via INTRAVENOUS
  Filled 2019-10-11 (×7): qty 2

## 2019-10-11 MED ORDER — ETOMIDATE 2 MG/ML IV SOLN
INTRAVENOUS | Status: AC | PRN
  Administered 2019-10-11: 20 mg via INTRAVENOUS

## 2019-10-11 MED ORDER — HEPARIN SODIUM (PORCINE) 5000 UNIT/ML IJ SOLN
5000.0000 [IU] | Freq: Three times a day (TID) | INTRAMUSCULAR | Status: DC
  Administered 2019-10-11: 5000 [IU] via SUBCUTANEOUS
  Filled 2019-10-11: qty 1

## 2019-10-11 MED ORDER — POTASSIUM CHLORIDE 20 MEQ/15ML (10%) PO SOLN
40.0000 meq | Freq: Once | ORAL | Status: AC
  Administered 2019-10-11: 04:00:00 40 meq via ORAL
  Filled 2019-10-11: qty 30

## 2019-10-11 MED ORDER — SODIUM CHLORIDE 0.9 % IV SOLN
100.0000 mg | Freq: Two times a day (BID) | INTRAVENOUS | Status: AC
  Administered 2019-10-11 – 2019-10-14 (×8): 100 mg via INTRAVENOUS
  Filled 2019-10-11 (×8): qty 100

## 2019-10-11 MED ORDER — POTASSIUM CHLORIDE 10 MEQ/100ML IV SOLN
10.0000 meq | Freq: Once | INTRAVENOUS | Status: AC
  Administered 2019-10-11: 10 meq via INTRAVENOUS
  Filled 2019-10-11: qty 100

## 2019-10-11 MED ORDER — MIDAZOLAM HCL 2 MG/2ML IJ SOLN
1.0000 mg | INTRAMUSCULAR | Status: DC | PRN
  Administered 2019-10-11 – 2019-10-12 (×2): 1 mg via INTRAVENOUS
  Filled 2019-10-11: qty 2

## 2019-10-11 MED ORDER — VANCOMYCIN HCL IN DEXTROSE 1-5 GM/200ML-% IV SOLN
1000.0000 mg | Freq: Once | INTRAVENOUS | Status: DC
  Filled 2019-10-11: qty 200

## 2019-10-11 MED ORDER — SUCCINYLCHOLINE CHLORIDE 20 MG/ML IJ SOLN
INTRAMUSCULAR | Status: AC | PRN
  Administered 2019-10-11: 150 mg via INTRAVENOUS

## 2019-10-11 MED ORDER — HEPARIN BOLUS VIA INFUSION
2000.0000 [IU] | Freq: Once | INTRAVENOUS | Status: AC
  Administered 2019-10-11: 2000 [IU] via INTRAVENOUS
  Filled 2019-10-11: qty 2000

## 2019-10-11 MED ORDER — CHLORHEXIDINE GLUCONATE CLOTH 2 % EX PADS
6.0000 | MEDICATED_PAD | Freq: Every day | CUTANEOUS | Status: DC
  Administered 2019-10-11 – 2019-10-25 (×13): 6 via TOPICAL

## 2019-10-11 MED ORDER — SODIUM CHLORIDE 0.9 % IV SOLN: INTRAVENOUS | Status: DC | PRN

## 2019-10-11 MED ORDER — SODIUM CHLORIDE 0.9 % IV SOLN
2.0000 g | Freq: Once | INTRAVENOUS | Status: AC
  Administered 2019-10-11: 2 g via INTRAVENOUS
  Filled 2019-10-11: qty 2

## 2019-10-11 MED ORDER — POTASSIUM CHLORIDE 20 MEQ/15ML (10%) PO SOLN: 40.0000 meq | ORAL | Status: DC

## 2019-10-11 MED ORDER — ACETAMINOPHEN 325 MG PO TABS: 650.0000 mg | ORAL_TABLET | ORAL | Status: DC | PRN

## 2019-10-11 MED ORDER — FENTANYL CITRATE (PF) 100 MCG/2ML IJ SOLN
50.0000 ug | INTRAMUSCULAR | Status: AC | PRN
  Administered 2019-10-11 (×3): 50 ug via INTRAVENOUS
  Filled 2019-10-11 (×2): qty 2

## 2019-10-11 MED ORDER — VANCOMYCIN HCL 10 G IV SOLR
1250.0000 mg | Freq: Once | INTRAVENOUS | Status: AC
  Administered 2019-10-11: 1250 mg via INTRAVENOUS
  Filled 2019-10-11: qty 1250

## 2019-10-11 MED ORDER — MIDAZOLAM HCL 2 MG/2ML IJ SOLN
1.0000 mg | INTRAMUSCULAR | Status: AC | PRN
  Administered 2019-10-11 – 2019-10-12 (×3): 1 mg via INTRAVENOUS
  Filled 2019-10-11 (×3): qty 2

## 2019-10-11 MED ORDER — FENTANYL CITRATE (PF) 100 MCG/2ML IJ SOLN
INTRAMUSCULAR | Status: AC
  Administered 2019-10-11: 50 ug via INTRAVENOUS
  Filled 2019-10-11: qty 2

## 2019-10-11 MED ORDER — ORAL CARE MOUTH RINSE
15.0000 mL | OROMUCOSAL | Status: DC
  Administered 2019-10-11 – 2019-10-14 (×27): 15 mL via OROMUCOSAL

## 2019-10-11 MED ORDER — PANTOPRAZOLE SODIUM 40 MG IV SOLR
40.0000 mg | Freq: Every day | INTRAVENOUS | Status: DC
  Administered 2019-10-11 – 2019-10-14 (×4): 40 mg via INTRAVENOUS
  Filled 2019-10-11 (×4): qty 40

## 2019-10-11 MED ORDER — VANCOMYCIN HCL 10 G IV SOLR: 1250.0000 mg | INTRAVENOUS | Status: DC

## 2019-10-11 MED ORDER — SODIUM CHLORIDE 0.9 % IV BOLUS
250.0000 mL | Freq: Once | INTRAVENOUS | Status: AC
  Administered 2019-10-11: 21:00:00 250 mL via INTRAVENOUS

## 2019-10-11 MED ORDER — HEPARIN BOLUS VIA INFUSION
3500.0000 [IU] | Freq: Once | INTRAVENOUS | Status: DC
  Filled 2019-10-11: qty 3500

## 2019-10-11 MED ORDER — CHLORHEXIDINE GLUCONATE 0.12% ORAL RINSE (MEDLINE KIT)
15.0000 mL | Freq: Two times a day (BID) | OROMUCOSAL | Status: DC
  Administered 2019-10-11 – 2019-10-25 (×26): 15 mL via OROMUCOSAL

## 2019-10-11 MED ORDER — SODIUM CHLORIDE 0.9 % IV BOLUS (SEPSIS)
1000.0000 mL | Freq: Once | INTRAVENOUS | Status: AC
  Administered 2019-10-11: 01:00:00 1000 mL via INTRAVENOUS

## 2019-10-11 MED ORDER — PROPOFOL 1000 MG/100ML IV EMUL
INTRAVENOUS | Status: DC | PRN
  Administered 2019-10-11: 20 ug via INTRAVENOUS

## 2019-10-11 MED ORDER — PRO-STAT SUGAR FREE PO LIQD: 30.0000 mL | Freq: Two times a day (BID) | ORAL | Status: DC

## 2019-10-11 MED ORDER — PHENYLEPHRINE HCL-NACL 10-0.9 MG/250ML-% IV SOLN
0.0000 ug/min | INTRAVENOUS | Status: DC
  Administered 2019-10-11: 20 ug/min via INTRAVENOUS
  Administered 2019-10-12: 30 ug/min via INTRAVENOUS
  Administered 2019-10-13: 01:00:00 10 ug/min via INTRAVENOUS
  Filled 2019-10-11 (×3): qty 250

## 2019-10-11 MED ORDER — PROPOFOL 1000 MG/100ML IV EMUL
INTRAVENOUS | Status: AC
  Filled 2019-10-11: qty 100

## 2019-10-11 MED ORDER — VITAL AF 1.2 CAL PO LIQD
1000.0000 mL | ORAL | Status: DC
  Administered 2019-10-11 – 2019-10-12 (×2): 1000 mL

## 2019-10-11 NOTE — Progress Notes (Signed)
Pressure getting a little soft 82/37 (51). MD notified. Awaiting orders.   Jackalyn Lombard

## 2019-10-11 NOTE — Progress Notes (Addendum)
[facility] for Heparin Indication: chest pain/ACS  Allergies  Allergen Reactions  . Ambien [Zolpidem]     confusion  . Pyridium [Phenazopyridine Hcl] Other (See Comments)    hemolysis  . Sulfa Antibiotics Rash    Patient Measurements: Height: 5' 2.5" (158.8 cm) Weight: 143 lb 15.4 oz (65.3 kg) IBW/kg (Calculated) : 51.25 Heparin Dosing Weight: 64kg  Vital Signs: Temp: 97.7 F (36.5 C) (11/17 1500) Temp Source: Axillary (11/17 1500) BP: 117/54 (11/17 1500) Pulse Rate: 72 (11/17 1500)  Labs: Recent Labs    10/11/19 0021  10/11/19 0049 10/11/19 0246 10/11/19 0247 10/11/19 0429 10/11/19 0525 10/11/19 1519  HGB 16.3*   < >  --   --  12.2 13.6 12.6  --   HCT 49.6*   < >  --   --  36.0 41.8 37.0  --   PLT 185  --   --   --   --  146*  --   --   APTT  --   --   --   --   --  26  --   --   LABPROT  --   --   --   --   --  16.4*  --   --   INR  --   --   --   --   --  1.3*  --   --   HEPARINUNFRC  --   --   --   --   --   --   --  0.81*  CREATININE 1.13*  --  1.01*  --   --   --   --   --   TROPONINIHS 960*  --   --  1,191*  --   --   --   --    < > = values in this interval not displayed.    Estimated Creatinine Clearance: 35.9 mL/min (A) (by C-G formula based on SCr of 1.01 mg/dL (H)).   Medical History: Past Medical History:  Diagnosis Date  . Allergy   . Anxiety   . Arthritis   . Blood transfusion without reported diagnosis   . Breast cancer ([place])   . Cancer ([place])    breast, s/p RXT, bilateral  . Cataract   . History of echocardiogram    a. Echo 8/16: Vigorous LVF, EF 92-11%, grade 1 diastolic dysfunction, trivial AI, mild MR, mild RVE, normal RV function, PASP 33 mmHg // b. Echo 1/17 EF 65-70%, normal wall motion, grade 1 diastolic dysfunction, trivial AI, MAC, mild MR // c. Echo 11/26/16: mild LVH, EF 60-65, mild AI, mild MR, mild LAE, mild to mod TR  . History of stress test    Lexiscan Myoview 8/16:  EF 81%, breast  attenuation, no ischemia; Low Risk // Myoview 11/26/16: EF 75, apical defect c/w soft tissue attenuation, no ischemia or scar; Low Risk  . HOCM (hypertrophic obstructive cardiomyopathy) ([place])   . Hyperlipidemia   . Hypertension   . Hypothyroidism   . Personal history of radiation therapy   . Shortness of breath   . Thyroid disease    Assessment: 33 YOF presenting from SNF in acute respiratory distress 2/2 pna, rising troponin and concern for ACS.  Not on anticoagulation PTA,  -initial heparin level= 0.81 (possibly due to heparin [place] this am in ED)  Goal of Therapy:  Heparin level 0.3-0.7 units/ml Monitor platelets by anticoagulation protocol: Yes   Plan:  -Decrease heparin to  650 units/hr -Recheck heparin level and CBC in am  Hildred Laser, PharmD Clinical Pharmacist **Pharmacist phone directory can now be found on [email or web address] ([email or web address]).  Listed under [facility].

## 2019-10-11 NOTE — Progress Notes (Signed)
  Echocardiogram 2D Echocardiogram has been performed.  Hannah Galvan 10/11/2019, 10:33 AM

## 2019-10-11 NOTE — Progress Notes (Signed)
RT NOTE: RT pulled ETT back from 22cm to 20cm per MD order. Patient tolerated well. RT will continue to monitor.

## 2019-10-11 NOTE — Progress Notes (Signed)
Pharmacy Antibiotic Note  Hannah Galvan is a 85 y.o. female admitted on 10/11/2019 with HCAP.  Pharmacy has been consulted for Vancomycin and Cefepime dosing.  Cefepime 2gm given in ED and 1250mg  IV Vanc given  Plan: Cefepime 2gm IV q12h Vancomycin 1250 mg IV Q 48 hrs. Goal AUC 400-550. Expected AUC: 468 SCr used: 1.13 Will f/u renal function, micro data, and pt's clinical condition Vanc levels prn   Height: 5' 2.5" (158.8 cm) Weight: 143 lb 15.4 oz (65.3 kg) IBW/kg (Calculated) : 51.25  Temp (24hrs), Avg:99.5 F (37.5 C), Min:98 F (36.7 C), Max:100.9 F (38.3 C)  Recent Labs  Lab 10/11/19 0021  WBC 18.6*  LATICACIDVEN 3.0*    CrCl cannot be calculated (Patient's most recent lab result is older than the maximum 21 days allowed.).    Allergies  Allergen Reactions  . Ambien [Zolpidem]     confusion  . Pyridium [Phenazopyridine Hcl] Other (See Comments)    hemolysis  . Sulfa Antibiotics Rash    Antimicrobials this admission: 11/17 Cefepime >>  11/17 Vanc >>   Microbiology results: 11/17 BCx:   UCx:    Sputum:    MRSA PCR:   SARS coronavirus 2: negative  Thank you for allowing pharmacy to be a part of this patient's care.  Sherlon Handing, PharmD, BCPS See amion for clinical pharmacist phone list 10/11/2019 2:37 AM

## 2019-10-11 NOTE — Progress Notes (Signed)
ANTICOAGULATION CONSULT NOTE - Initial Consult  Pharmacy Consult for Heparin Indication: chest pain/ACS  Allergies  Allergen Reactions  . Ambien [Zolpidem]     confusion  . Pyridium [Phenazopyridine Hcl] Other (See Comments)    hemolysis  . Sulfa Antibiotics Rash    Patient Measurements: Height: 5' 2.5" (158.8 cm) Weight: 143 lb 15.4 oz (65.3 kg) IBW/kg (Calculated) : 51.25 Heparin Dosing Weight: 64kg  Vital Signs: Temp: 100.9 F (38.3 C) (11/17 0049) Temp Source: Rectal (11/17 0049) BP: 97/50 (11/17 0600) Pulse Rate: 70 (11/17 0600)  Labs: Recent Labs    10/11/19 0021  10/11/19 0049 10/11/19 0246 10/11/19 0247 10/11/19 0429 10/11/19 0525  HGB 16.3*   < >  --   --  12.2 13.6 12.6  HCT 49.6*   < >  --   --  Hannah.0 41.8 37.0  PLT 185  --   --   --   --  146*  --   APTT  --   --   --   --   --  26  --   LABPROT  --   --   --   --   --  16.4*  --   INR  --   --   --   --   --  1.3*  --   CREATININE 1.13*  --  1.01*  --   --   --   --   TROPONINIHS 960*  --   --  1,191*  --   --   --    < > = values in this interval not displayed.    Estimated Creatinine Clearance: 35.9 mL/min (A) (by C-G formula based on SCr of 1.01 mg/dL (H)).   Medical History: Past Medical History:  Diagnosis Date  . Allergy   . Anxiety   . Arthritis   . Blood transfusion without reported diagnosis   . Breast cancer ([place])   . Cancer ([place])    breast, s/p RXT, bilateral  . Cataract   . History of echocardiogram    a. Echo 8/16: Vigorous LVF, EF 82-99%, grade 1 diastolic dysfunction, trivial AI, mild MR, mild RVE, normal RV function, PASP 33 mmHg // b. Echo 1/17 EF 65-70%, normal wall motion, grade 1 diastolic dysfunction, trivial AI, MAC, mild MR // c. Echo 11/26/16: mild LVH, EF 60-65, mild AI, mild MR, mild LAE, mild to mod TR  . History of stress test    Lexiscan Myoview 8/16:  EF 81%, breast attenuation, no ischemia; Low Risk // Myoview 11/26/16: EF 75, apical defect c/w soft tissue  attenuation, no ischemia or scar; Low Risk  . HOCM (hypertrophic obstructive cardiomyopathy) ([place])   . Hyperlipidemia   . Hypertension   . Hypothyroidism   . Personal history of radiation therapy   . Shortness of breath   . Thyroid disease    Assessment: Hannah Galvan presenting from SNF in acute respiratory distress 2/2 pna, rising troponin and concern for ACS.  Not on anticoagulation PTA, H/H wnl, plts 146.    Goal of Therapy:  Heparin level 0.3-0.7 units/ml Monitor platelets by anticoagulation protocol: Yes   Plan:  Heparin 2000 units IV x1 (small bolus d/t SQ heparin administered in ED), and gtt at 750 units/hr F/u 8 hour heparin level  Bertis Ruddy, PharmD Clinical Pharmacist Please check AMION for all [place] numbers 10/11/2019 7:12 AM

## 2019-10-11 NOTE — H&P (Signed)
NAME:  Hannah Galvan, MRN:  [id number], DOB:  May 30, 1931, LOS: 0 ADMISSION DATE:  10/11/2019, CONSULTATION DATE:  11/17 REFERRING MD:  Dr Dina Rich, CHIEF COMPLAINT: Acute respiratory failure  Brief History   85 year old female who presented from outside skilled nursing facility with acute respiratory distress, COVID-19 negative on admission.  Patient suffered worsening respiratory distress in emergency department and was subsequently intubated.  PCCM consulted for admission  History of present illness   Pt is encephelopathic; therefore, this HPI is obtained from chart review.  85 year old female with extensive past medical history including but not limited to hypertrophic obstructive cardiomyopathy and advanced dementia who to the emergency department via EMS from local skilled nursing facility in acute respiratory distress.  According to chart review patient was found on a nightly rounds with increased respiratory distress and work of breathing with an oxygen saturation of 60% on room air. She was placed on CPAP by EMS with improvement in saturations patient reports she did report that she was mildly short of breath prior to going to bed tonight.    Current lab work impressive for lactic acidosis, leukocytosis, elevated BNP, and hypokalemia.  Chest x-ray with signs of early opacities concerning for pulmonary edema versus multifocal infection.  While in the emergency department due to concern for COVID-19 patient was removed from NIV therapy with eventual return of increased work of breathing and signs of respiratory fatigue prompting need for endotracheal intubation.  PCCM consulted for admission  Past Medical History  Hypertrophic obstructive cardiomyopathy, breast cancer status post radiation, hypothyroidism, hypertension, hyperlipidemia, arthritis, and anxiety  Significant Hospital Events   11/17 admitted  Consults:  None  Procedures:  11/17 ETT  Significant Diagnostic Tests:  Chest  x-ray 11/17 >> diffuse heterogeneous mixed interstitial and patchy airspace opacities, right greater than the left.  Findings represent possible pulmonary edema, multifocal infection, or combination.  Small bilateral pleural effusions.  Micro Data:  [id number] 11/17 >> negative Blood culture 11/17 >> Urine culture 1117 >> Respiratory virus panel 11/17 >> Sputum culture 11/17 >> MRSA PCR 11/17 >>  Antimicrobials:  Cefepime 11/17 >> Vancomycin 11/17 >>  Interim history/subjective:  Intubated and sedated, she currently appears comfortable on the ventilator  Objective   Blood pressure (!) 80/45, pulse 76, temperature (!) 100.9 F (38.3 C), temperature source Rectal, resp. rate (!) 21, height 5' 2.5" (1.588 m), weight 65.3 kg, SpO2 96 %.    Vent Mode: PRVC FiO2 (%):  [100 %] 100 % Set Rate:  [16 bmp] 16 bmp Vt Set:  [410 mL] 410 mL PEEP:  [5 cmH20] 5 cmH20 Plateau Pressure:  [16 cmH20] 16 cmH20  No intake or output data in the 24 hours ending 10/11/19 0154 Filed Weights   10/11/19 0049  Weight: 65.3 kg    Examination: General: Chronically ill appearing elderly female on mechanical ventilation, in NAD HEENT: ETT, MM pink/moist, PERRL,  Neuro: Sedated on ventilator but will open eyes to verbal stimuli CV: s1s2 regular rate and rhythm, no murmur, rubs, or gallops,  PULM: Coarse rhonchi heard bilaterally right greater than left GI: soft, bowel sounds active in all 4 quadrants, non-tender, non-distended Extremities: warm/dry, very mild lower extremity edema  Skin: no rashes or lesions   Resolved Hospital Problem list     Assessment & Plan:   Acute Hypoxic Respiratory Failure  -Likely secondary to HCAP P: Continue ventilator support with lung protective strategies Wean PEEP and FiO2 for sats greater than 90%. Head of bed elevated 30 degrees.  Plateau pressures less than 30 cm H20.  Follow intermittent chest x-ray and ABG.   SAT/SBT as tolerated, mentation preclude  extubation  Ensure adequate pulmonary hygiene  Follow cultures  VAP bundle in place   Sepsis likely secondary to HCAP -On admission patient was found tachycardic, tachypneic, febrile, leukocytotic, with suspected source of infection meeting criteria for sepsis P: Continue broad-spectrum antibiotics Cautious IV hydration Obtain MRSA PCR Pan culture Trend lactic acid Obtain procalcitonin Obtain urine strep pneumonia and Legionella Obtain SLP eval prior to initiation of oral diet  History of hypertrophic obstructive cardiomyopathy -Echo January 2019 preserved EF of 55 to 60% with signs of left ventricular hypertrophy no evidence of diastolic dysfunction P: Appears slightly hypovolemic IV hydration per sepsis guidelines Daily weight Strict intake and output Renal function and electrolytes Repeat echocardiogram Trend BMP  Acute kidney injury -Appears to be between 0.8-0.9, on admission to 1.13 P: Continue cautious IV hydration Ensure adequate renal perfusion Avoid nephrotoxins Monitor renal output  Elevated troponin -Likely secondary to demand and acute respiratory distress no signs of ST elevation or depression on EKG P: Trend troponins Follow-up echo Consider cardiology consult if troponin continues to rise  Hypertension -Home medication includes Norvasc, Cozaar, and hydrochlorothiazide  P: Hold home antihypertensives in the acute setting  Hypothyroidism P: Continue home levothyroxine once medication reconciliation completed  Hyperlipidemia P: Continue home statin once medication reconciliation completed  Hypokalemia P: Supplementation as needed Trended Bmet  Advanced dementia Goals of care -Home medication includes Zoloft, Ativan, Aricept -Per discussion with emergency department physician & patient is a DO NOT RESUSCITATE with gold and portable DNR form however given possible reversible respiratory distress patient and family decided to pursue intubation  measures P: Hold home medications acutely  Best practice:  Diet: N.p.o. Pain/Anxiety/Delirium protocol (if indicated): As needed fentanyl and Versed VAP protocol (if indicated): In place DVT prophylaxis: Subcu heparin GI prophylaxis: PPI Glucose control: Sliding scale insulin Mobility: Bedrest Code Status: Limited CODE BLUE intubation only Family Communication: Son updated by ED physician will update as well Disposition: ICU  Labs   CBC: Recent Labs  Lab 10/11/19 0021 10/11/19 0043  WBC 18.6*  --   NEUTROABS 16.0*  --   HGB 16.3* 15.3*  HCT 49.6* 45.0  MCV 96.7  --   PLT 185  --     Basic Metabolic Panel: Recent Labs  Lab 10/11/19 0043  NA 138  K 2.7*   GFR: CrCl cannot be calculated (Patient's most recent lab result is older than the maximum 21 days allowed.). Recent Labs  Lab 10/11/19 0021  WBC 18.6*  LATICACIDVEN 3.0*    Liver Function Tests: No results for input(s): AST, ALT, ALKPHOS, BILITOT, PROT, ALBUMIN in the last 168 hours. No results for input(s): LIPASE, AMYLASE in the last 168 hours. No results for input(s): AMMONIA in the last 168 hours.  ABG    Component Value Date/Time   PHART 7.384 10/11/2019 0043   PCO2ART 30.9 (L) 10/11/2019 0043   PO2ART 58.0 (L) 10/11/2019 0043   HCO3 18.5 (L) 10/11/2019 0043   TCO2 19 (L) 10/11/2019 0043   ACIDBASEDEF 5.0 (H) 10/11/2019 0043   O2SAT 90.0 10/11/2019 0043     Coagulation Profile: No results for input(s): INR, PROTIME in the last 168 hours.  Cardiac Enzymes: No results for input(s): CKTOTAL, CKMB, CKMBINDEX, TROPONINI in the last 168 hours.  HbA1C: Hgb A1c MFr Bld  Date/Time Value Ref Range Status  12/20/2018 04:56 PM 5.5 <5.7 % of total  Hgb Final    Comment:    For the purpose of screening for the presence of diabetes: . <5.7%       Consistent with the absence of diabetes 5.7-6.4%    Consistent with increased risk for diabetes             (prediabetes) > or =6.5%  Consistent with  diabetes . This assay result is consistent with a decreased risk of diabetes. . Currently, no consensus exists regarding use of hemoglobin A1c for diagnosis of diabetes in children. . According to American Diabetes Association (ADA) guidelines, hemoglobin A1c <7.0% represents optimal control in non-pregnant diabetic patients. Different metrics may apply to specific patient populations.  Standards of Medical Care in Diabetes(ADA). Marland Kitchen   06/02/2012 03:11 PM 5.4 <5.7 % Final    Comment:                                                                           According to the ADA Clinical Practice Recommendations for 2011, when HbA1c is used as a screening test:     >=6.5%   Diagnostic of Diabetes Mellitus            (if abnormal result is confirmed)   5.7-6.4%   Increased risk of developing Diabetes Mellitus   References:Diagnosis and Classification of Diabetes Mellitus,Diabetes [id number] 1):S62-S69 and Standards of Medical Care in         Diabetes - 2011,Diabetes [id number] (Suppl 1):S11-S61.      CBG: No results for input(s): GLUCAP in the last 168 hours.  Review of Systems:   Unable to obtain given unresponsiveness and endotracheal intubation  Past Medical History  She,  has a past medical history of Allergy, Anxiety, Arthritis, Blood transfusion without reported diagnosis, Breast cancer ([place]), Cancer ([place]), Cataract, History of echocardiogram, History of stress test, HOCM (hypertrophic obstructive cardiomyopathy) ([place]), Hyperlipidemia, Hypertension, Hypothyroidism, Personal history of radiation therapy, Shortness of breath, and Thyroid disease.    Surgical History    Past Surgical History:  Procedure Laterality Date  . ABDOMINAL HYSTERECTOMY  2008  . bladder tact    . BREAST LUMPECTOMY Right 2000  . BREAST LUMPECTOMY Left 2007  . CATARACT EXTRACTION  2011  . JOINT REPLACEMENT    . KNEE SURGERY     right  . ROTATOR CUFF REPAIR     right  . THYROID  SURGERY  2002  . TOTAL KNEE ARTHROPLASTY Left 06/02/2013   Dr Rhona Raider  . TOTAL KNEE ARTHROPLASTY Right 06/02/2013   Procedure: RIGHT TOTAL KNEE ARTHROPLASTY;  Surgeon: Hessie Dibble, MD;  Location: [facility];  Service: Orthopedics;  Laterality: Right;  . TOTAL KNEE ARTHROPLASTY Left 08/22/2014   Procedure: TOTAL KNEE ARTHROPLASTY;  Surgeon: Hessie Dibble, MD;  Location: [facility];  Service: Orthopedics;  Laterality: Left;      Social History   reports that she has never smoked. She has never used smokeless tobacco. She reports that she does not drink alcohol or use drugs.    Family History   Her family history includes Cancer in her brother, brother, brother, brother, brother, father, sister, sister, and sister; Heart disease in her sister; Hypertension in her mother; Stroke in her mother.  Allergies Allergies  Allergen Reactions  . Ambien [Zolpidem]     confusion  . Pyridium [Phenazopyridine Hcl] Other (See Comments)    hemolysis  . Sulfa Antibiotics Rash    Home Medications  Prior to Admission medications   Medication Sig Start Date End Date Taking? Authorizing Provider  acetaminophen (TYLENOL) 325 MG tablet TAKE 1 TAB BY MOUTH EVERY 6 HOURS AS NEEDED FOR MILD PAIN Patient taking differently: Take 325 mg by mouth every 6 (six) hours as needed for mild pain.  12/22/18   Susy Frizzle, MD  albuterol (PROVENTIL HFA;VENTOLIN HFA) 108 (90 Base) MCG/ACT inhaler Inhale 2 puffs into the lungs every 4 (four) hours as needed for wheezing or shortness of breath. 01/11/19   Aline August, MD  amLODipine (NORVASC) 5 MG tablet Take 1 tablet (5 mg total) by mouth daily. 02/21/19   Susy Frizzle, MD  ARIPiprazole (ABILIFY) 2 MG tablet Take 1 tablet (2 mg total) by mouth daily. 09/13/19   Susy Frizzle, MD  aspirin 81 MG tablet Take 81 mg by mouth daily.    [provider]  Calcium Carbonate-Vit D-Min (GNP CALCIUM 1200) 1200-1000 MG-UNIT CHEW 1 tab PO QAM Patient taking  differently: Chew 1 tablet by mouth.  08/05/17   Susy Frizzle, MD  cholecalciferol (VITAMIN D) 1000 units tablet Take 1,000 Units by mouth daily.    [provider]  Cranberry 500 MG TABS Take 500 mg by mouth daily.     [provider]  dextromethorphan-guaiFENesin (MUCINEX DM) 30-600 MG 12hr tablet Take 1 tablet by mouth 2 (two) times daily. 01/11/19   Aline August, MD  donepezil (ARICEPT) 5 MG tablet Take 1 tablet (5 mg total) by mouth at bedtime. 02/21/19   Susy Frizzle, MD  exemestane (AROMASIN) 25 MG tablet TAKE 1 TAB BY MOUTH EVERY DAY Patient taking differently: Take 25 mg by mouth daily after breakfast.  12/13/18   Susy Frizzle, MD  folic acid (FOLVITE) 1 MG tablet TAKE 1 TAB BY MOUTH EVERY DAY 02/21/19   Susy Frizzle, MD  hydrochlorothiazide (HYDRODIURIL) 25 MG tablet Take 1 tablet (25 mg total) by mouth daily. 02/21/19   Susy Frizzle, MD  levothyroxine (SYNTHROID, LEVOTHROID) 125 MCG tablet TAKE 1 TAB BY MOUTH EVERY DAY 02/21/19   Susy Frizzle, MD  LORazepam (ATIVAN) 1 MG tablet 1 tab po qhs 08/16/19   Susy Frizzle, MD  losartan (COZAAR) 100 MG tablet TAKE 1 TAB BY MOUTH EVERY DAY FOR HTN 02/21/19   Susy Frizzle, MD  metoprolol succinate (TOPROL-XL) 25 MG 24 hr tablet Take 1 tablet (25 mg total) by mouth daily. 02/21/19   Susy Frizzle, MD  Multiple Vitamins-Minerals (CENTRUM SILVER ADULT 50+ PO) Take 1 tablet by mouth daily.     [provider]  nitroGLYCERIN (NITROSTAT) 0.4 MG SL tablet Place 1 tablet (0.4 mg total) under the tongue every 5 (five) minutes as needed for chest pain. 11/29/15   Richardson Dopp T, PA-C  potassium chloride SA (KLOR-CON M20) 20 MEQ tablet Take 1 tablet (20 mEq total) by mouth daily. 02/21/19   Susy Frizzle, MD  rosuvastatin (CRESTOR) 10 MG tablet TAKE 1 TABLET BY MOUTH EVERY DAY AT 6PM 02/21/19   Susy Frizzle, MD  sertraline (ZOLOFT) 100 MG tablet Take 1 tablet (100 mg total) by mouth daily.  02/21/19   Susy Frizzle, MD  triamcinolone cream (KENALOG) 0.1 % Apply 1 application topically 2 (two)  times daily. 02/16/18   Susy Frizzle, MD     Critical care time:    Performed by: Johnsie Cancel  Total critical care time: 40 minutes  Critical care time was exclusive of separately billable procedures and treating other patients.  Critical care was necessary to treat or prevent imminent or life-threatening deterioration.  Critical care was time spent personally by me on the following activities: development of treatment plan with patient and/or surrogate as well as nursing, discussions with consultants, evaluation of patient's response to treatment, examination of patient, obtaining history from patient or surrogate, ordering and performing treatments and interventions, ordering and review of laboratory studies, ordering and review of radiographic studies, pulse oximetry and re-evaluation of patient's condition.   Johnsie Cancel, NP-C [facility] After hours pager: [phone]. 10/11/2019, 3:04 AM

## 2019-10-11 NOTE — ED Provider Notes (Signed)
[facility] EMERGENCY DEPARTMENT Provider Note   CSN: [id number] Arrival date & time: 10/11/19  0012     History   Chief Complaint Chief Complaint  Patient presents with   Respiratory Distress    HPI Hannah Galvan is a 85 y.o. female.     HPI  This is an 85 year old female with a history of HOCM, hypertension, hyperlipidemia, thyroid disease who [facility].  Noted by nursing staff during their nightly rounding to be in respiratory distress and increased work of breathing.  Noted to have room air sats in the 60s.  She was placed on CPAP by EMS and O2 sats improved to 90%.  She was given Solu-Medrol and 2 g of magnesium in route.  No known history of COPD or CHF.  Her last echocardiogram in the chart shows an EF of 55 to 60%.  On my evaluation, patient is fairly noncontributory to history taking.  She does state that she started feeling short of breath when she went to bed.  She denies any prodromal symptoms of fever or cough.  Denies history of heart failure or leg swelling.  She is speaking in very short sentences and significantly tachypneic with increased work of breathing.  Level 5 caveat for acuity of condition.  Past Medical History:  Diagnosis Date   Allergy    Anxiety    Arthritis    Blood transfusion without reported diagnosis    Breast cancer ([place])    Cancer ([place])    breast, s/p RXT, bilateral   Cataract    History of echocardiogram    a. Echo 8/16: Vigorous LVF, EF 22-63%, grade 1 diastolic dysfunction, trivial AI, mild MR, mild RVE, normal RV function, PASP 33 mmHg // b. Echo 1/17 EF 65-70%, normal wall motion, grade 1 diastolic dysfunction, trivial AI, MAC, mild MR // c. Echo 11/26/16: mild LVH, EF 60-65, mild AI, mild MR, mild LAE, mild to mod TR   History of stress test    Lexiscan Myoview 8/16:  EF 81%, breast attenuation, no ischemia; Low Risk // Myoview 11/26/16: EF 75, apical defect c/w soft  tissue attenuation, no ischemia or scar; Low Risk   HOCM (hypertrophic obstructive cardiomyopathy) ([place])    Hyperlipidemia    Hypertension    Hypothyroidism    Personal history of radiation therapy    Shortness of breath    Thyroid disease     Patient Active Problem List   Diagnosis Date Noted   Sepsis ([place]) 33/54/5625   Acute metabolic encephalopathy 63/89/3734   Fall at home, initial encounter 03/02/2018   Head contusion 03/02/2018   HOCM (hypertrophic obstructive cardiomyopathy) ([place])    Alzheimer's dementia without behavioral disturbance ([place]) 01/27/2017   Chest pain 11/17/2015   Dyspnea 12/06/2014   Anxiety and depression 09/03/2014   CN (constipation) 09/03/2014   Essential hypertension 08/28/2014   Status post left knee replacement 08/28/2014   Acute delirium 06/05/2013   Hyponatremia 06/05/2013   Acute urinary retention 06/05/2013   Hypokalemia 06/05/2013   Fever, unspecified 06/05/2013   Hypothyroidism 06/05/2013   Left knee DJD 06/02/2013    Class: Chronic   UTI (lower urinary tract infection) 08/31/2012   Heart murmur, aortic 07/15/2012   Dizziness - giddy 07/15/2012   Gait disturbance 12/21/2011   Osteopenia 12/21/2011   Cholelithiasis with cholecystitis without obstruction 12/21/2011   Liver hemangioma 12/21/2011   Pulmonary nodule, right 12/21/2011   Breast cancer ([place]) 12/03/2011   Edema  of both legs 09/15/2011   Thyroid disease    Hyperlipidemia    Arthritis    Cancer [facility])     Past Surgical History:  Procedure Laterality Date   ABDOMINAL HYSTERECTOMY  2008   bladder tact     BREAST LUMPECTOMY Right 2000   BREAST LUMPECTOMY Left 2007   CATARACT EXTRACTION  2011   JOINT REPLACEMENT     KNEE SURGERY     right   ROTATOR CUFF REPAIR     right   THYROID SURGERY  2002   TOTAL KNEE ARTHROPLASTY Left 06/02/2013   Dr Rhona Raider   TOTAL KNEE ARTHROPLASTY Right 06/02/2013   Procedure: RIGHT TOTAL KNEE  ARTHROPLASTY;  Surgeon: Hessie Dibble, MD;  Location: [facility];  Service: Orthopedics;  Laterality: Right;   TOTAL KNEE ARTHROPLASTY Left 08/22/2014   Procedure: TOTAL KNEE ARTHROPLASTY;  Surgeon: Hessie Dibble, MD;  Location: [facility];  Service: Orthopedics;  Laterality: Left;     OB History    Gravida  2   Para  2   Term      Preterm      AB      Living        SAB      TAB      Ectopic      Multiple      Live Births               Home Medications    Prior to Admission medications   Medication Sig Start Date End Date Taking? Authorizing Provider  acetaminophen (TYLENOL) 325 MG tablet TAKE 1 TAB BY MOUTH EVERY 6 HOURS AS NEEDED FOR MILD PAIN Patient taking differently: Take 325 mg by mouth every 6 (six) hours as needed for mild pain.  12/22/18   Susy Frizzle, MD  albuterol (PROVENTIL HFA;VENTOLIN HFA) 108 (90 Base) MCG/ACT inhaler Inhale 2 puffs into the lungs every 4 (four) hours as needed for wheezing or shortness of breath. 01/11/19   Aline August, MD  amLODipine (NORVASC) 5 MG tablet Take 1 tablet (5 mg total) by mouth daily. 02/21/19   Susy Frizzle, MD  ARIPiprazole (ABILIFY) 2 MG tablet Take 1 tablet (2 mg total) by mouth daily. 09/13/19   Susy Frizzle, MD  aspirin 81 MG tablet Take 81 mg by mouth daily.    [provider]  Calcium Carbonate-Vit D-Min (GNP CALCIUM 1200) 1200-1000 MG-UNIT CHEW 1 tab PO QAM Patient taking differently: Chew 1 tablet by mouth.  08/05/17   Susy Frizzle, MD  cholecalciferol (VITAMIN D) 1000 units tablet Take 1,000 Units by mouth daily.    [provider]  Cranberry 500 MG TABS Take 500 mg by mouth daily.     [provider]  dextromethorphan-guaiFENesin (MUCINEX DM) 30-600 MG 12hr tablet Take 1 tablet by mouth 2 (two) times daily. 01/11/19   Aline August, MD  donepezil (ARICEPT) 5 MG tablet Take 1 tablet (5 mg total) by mouth at bedtime. 02/21/19   Susy Frizzle, MD  exemestane  (AROMASIN) 25 MG tablet TAKE 1 TAB BY MOUTH EVERY DAY Patient taking differently: Take 25 mg by mouth daily after breakfast.  12/13/18   Susy Frizzle, MD  folic acid (FOLVITE) 1 MG tablet TAKE 1 TAB BY MOUTH EVERY DAY 02/21/19   Susy Frizzle, MD  hydrochlorothiazide (HYDRODIURIL) 25 MG tablet Take 1 tablet (25 mg total) by mouth daily. 02/21/19   Susy Frizzle, MD  levothyroxine (SYNTHROID,  LEVOTHROID) 125 MCG tablet TAKE 1 TAB BY MOUTH EVERY DAY 02/21/19   Susy Frizzle, MD  LORazepam (ATIVAN) 1 MG tablet 1 tab po qhs 08/16/19   Susy Frizzle, MD  losartan (COZAAR) 100 MG tablet TAKE 1 TAB BY MOUTH EVERY DAY FOR HTN 02/21/19   Susy Frizzle, MD  metoprolol succinate (TOPROL-XL) 25 MG 24 hr tablet Take 1 tablet (25 mg total) by mouth daily. 02/21/19   Susy Frizzle, MD  Multiple Vitamins-Minerals (CENTRUM SILVER ADULT 50+ PO) Take 1 tablet by mouth daily.     [provider]  nitroGLYCERIN (NITROSTAT) 0.4 MG SL tablet Place 1 tablet (0.4 mg total) under the tongue every 5 (five) minutes as needed for chest pain. 11/29/15   Richardson Dopp T, PA-C  potassium chloride SA (KLOR-CON M20) 20 MEQ tablet Take 1 tablet (20 mEq total) by mouth daily. 02/21/19   Susy Frizzle, MD  rosuvastatin (CRESTOR) 10 MG tablet TAKE 1 TABLET BY MOUTH EVERY DAY AT 6PM 02/21/19   Susy Frizzle, MD  sertraline (ZOLOFT) 100 MG tablet Take 1 tablet (100 mg total) by mouth daily. 02/21/19   Susy Frizzle, MD  triamcinolone cream (KENALOG) 0.1 % Apply 1 application topically 2 (two) times daily. 02/16/18   Susy Frizzle, MD    Family History Family History  Problem Relation Age of Onset   Hypertension Mother    Stroke Mother    Cancer Father    Cancer Sister        leukemia   Cancer Brother        leukemia   Cancer Sister        vaginal   Cancer Sister        bladder   Cancer Brother        prostate, skin   Cancer Brother        colon   Cancer Brother         prostate, bone mets   Cancer Brother        skin   Heart disease Sister     Social History Social History   Tobacco Use   Smoking status: Never Smoker   Smokeless tobacco: Never Used   Tobacco comment: never used tobacco  Substance Use Topics   Alcohol use: No    Alcohol/week: 0.0 standard drinks   Drug use: No     Allergies   Ambien [zolpidem], Pyridium [phenazopyridine hcl], and Sulfa antibiotics   Review of Systems Review of Systems  Unable to perform ROS: Acuity of condition  Constitutional: Positive for fever.  Respiratory: Positive for shortness of breath.   All other systems reviewed and are negative.    Physical Exam Updated Vital Signs BP 121/63    Pulse (!) 101    Temp (!) 100.9 F (38.3 C) (Rectal)    Resp (!) 26    Ht 1.588 m (5' 2.5")    Wt 65.3 kg    SpO2 92%    BMI 25.91 kg/m   Physical Exam Vitals signs and nursing note reviewed.  Constitutional:      General: She is in acute distress.     Appearance: She is well-developed. She is ill-appearing. She is not diaphoretic.  HENT:     Head: Normocephalic and atraumatic.     Mouth/Throat:     Mouth: Mucous membranes are moist.  Eyes:     Pupils: Pupils are equal, round, and reactive to light.  Neck:  Musculoskeletal: Neck supple.  Cardiovascular:     Rate and Rhythm: Regular rhythm. Tachycardia present.     Heart sounds: Murmur present.  Pulmonary:     Effort: Pulmonary effort is normal. No respiratory distress.     Breath sounds: No wheezing.     Comments: Bilateral rales right greater than left Abdominal:     General: Bowel sounds are normal.     Palpations: Abdomen is soft.     Tenderness: There is no abdominal tenderness.  Musculoskeletal:     Comments: Trace bilateral lower extremity edema  Skin:    General: Skin is warm and dry.  Neurological:     Mental Status: She is alert and oriented to person, place, and time.  Psychiatric:        Mood and Affect: Mood normal.       ED Treatments / Results  Labs (all labs ordered are listed, but only abnormal results are displayed) Labs Reviewed  CBC WITH DIFFERENTIAL/PLATELET - Abnormal; Notable for the following components:      Result Value   WBC 18.6 (*)    RBC 5.13 (*)    Hemoglobin 16.3 (*)    HCT 49.6 (*)    Neutro Abs 16.0 (*)    Monocytes Absolute 1.2 (*)    Abs Immature Granulocytes 0.10 (*)    All other components within normal limits  POCT I-STAT 7, (LYTES, BLD GAS, ICA,H+H) - Abnormal; Notable for the following components:   pCO2 arterial 30.9 (*)    pO2, Arterial 58.0 (*)    Bicarbonate 18.5 (*)    TCO2 19 (*)    Acid-base deficit 5.0 (*)    Potassium 2.7 (*)    Hemoglobin 15.3 (*)    All other components within normal limits  CULTURE, BLOOD (ROUTINE X 2)  CULTURE, BLOOD (ROUTINE X 2)  URINE CULTURE  SARS CORONAVIRUS 2 BY RT PCR (HOSPITAL ORDER, PERFORMED [facility])  BASIC METABOLIC PANEL  BRAIN NATRIURETIC PEPTIDE  LACTIC ACID, PLASMA  LACTIC ACID, PLASMA  URINALYSIS, ROUTINE W REFLEX MICROSCOPIC  COMPREHENSIVE METABOLIC PANEL  APTT  PROTIME-INR  I-STAT ARTERIAL BLOOD GAS, ED  TROPONIN I (HIGH SENSITIVITY)    EKG EKG Interpretation  Date/Time:  Tuesday October 11 2019 00:15:41 EST Ventricular Rate:  101 PR Interval:    QRS Duration: 152 QT Interval:  391 QTC Calculation: 507 R Axis:   -35 Text Interpretation: Sinus tachycardia with irregular rate Probable left atrial enlargement Left bundle branch block Baseline wander in lead(s) V3 V5 Confirmed by Thayer Jew [id number]) on 10/11/2019 12:32:42 AM   Radiology Dg Chest Portable 1 View  Result Date: 10/11/2019 CLINICAL DATA:  Shortness of breath. Respiratory distress. EXAM: PORTABLE CHEST 1 VIEW COMPARISON:  06/21/2019 FINDINGS: Unchanged heart size and mediastinal contours with aortic atherosclerosis. Diffuse heterogeneous mixed interstitial patchy airspace opacities throughout both lungs, right  greater than left. Pulmonary vasculature is indistinct. Bilateral pleural effusions. Retrocardiac hiatal hernia. No pneumothorax. IMPRESSION: 1. Diffuse heterogeneous mixed interstitial and patchy airspace opacities, right greater than left. Findings may represent pulmonary edema, multifocal infection, or combination thereof. 2. Small bilateral pleural effusions. 3. Aortic atherosclerosis. Electronically Signed   By: Keith Rake M.D.   On: 10/11/2019 00:59    Procedures Procedure Name: Intubation Date/Time: 10/11/2019 2:24 AM Performed by: Merryl Hacker, MD Oxygen Delivery Method: Non-rebreather mask Preoxygenation: Pre-oxygenation with 100% oxygen Induction Type: IV induction Laryngoscope Size: Glidescope and 3 Grade View: Grade I Tube size: 7.5 mm  Number of attempts: 1 Placement Confirmation: ETT inserted through vocal cords under direct vision and Breath sounds checked- equal and bilateral Secured at: 21 cm Tube secured with: ETT holder      (including critical care time)  CRITICAL CARE Performed by: Merryl Hacker   Total critical care time: 55 minutes  Critical care time was exclusive of separately billable procedures and treating other patients.  Critical care was necessary to treat or prevent imminent or life-threatening deterioration.  Critical care was time spent personally by me on the following activities: development of treatment plan with patient and/or surrogate as well as nursing, discussions with consultants, evaluation of patient's response to treatment, examination of patient, obtaining history from patient or surrogate, ordering and performing treatments and interventions, ordering and review of laboratory studies, ordering and review of radiographic studies, pulse oximetry and re-evaluation of patient's condition.   Medications Ordered in ED Medications  ceFEPIme (MAXIPIME) 2 g in sodium chloride 0.9 % 100 mL IVPB (has no administration in time  range)  sodium chloride 0.9 % bolus 1,000 mL (has no administration in time range)    And  sodium chloride 0.9 % bolus 1,000 mL (has no administration in time range)  potassium chloride 10 mEq in 100 mL IVPB (has no administration in time range)  vancomycin (VANCOCIN) 1,250 mg in sodium chloride 0.9 % 250 mL IVPB (has no administration in time range)  propofol (DIPRIVAN) 1000 MG/100ML infusion (has no administration in time range)     Initial Impression / Assessment and Plan / ED Course  I have reviewed the triage vital signs and the nursing notes.  Pertinent labs & imaging results that were available during my care of the patient were reviewed by me and considered in my medical decision making (see chart for details).  Clinical Course as of Oct 11 223  Tue Oct 11, 2019  0028 Patient in acute respiratory distress.  She does have a DNR with her.  When I discussed with her the DNR she states that she does not want chest compressions.  When we discussed intubation, she states "do what ever you need to do."  I subsequently had a conversation with her son.  Her son believes that she would want to be intubated if that would provide time to figure out what was going on and would make her comfortable.   [CH]  0030 I have reviewed the patient's chart.  She does have some documented mild Alzheimer's.  As of February 2020 she was full code.  Given that her current condition appears to be primary respiratory, feel that intubation may provide [place] some time to figure out reversible causes.   [CH]  0140 Spoke with critical care.  They will evaluate the patient.  Nursing noted that patient not tolerating propofol and blood pressures declining.  Requested that they stop propofol and we will do as needed fentanyl Versed.   [CH]    Clinical Course User Index [CH] Merryl Hacker, MD   Son:  [phone]    Patient presents in acute respiratory distress.  Improved on CPAP but unfortunately had to be  removed.  Currently satting 88 to 92% on a nonrebreather.  She speaking in short sentences with increased work of breathing and tachypnea.  Initially afebrile but the noted to have a rectal temp of 100.9.  Infectious etiology including COVID-19 would be high on the list given fever.  She does not appear floridly volume overloaded and has no history of  CHF; however, given severity of respiratory distress and fairly acute onset, pulmonary edema would also be a consideration.  Sepsis work-up was initiated.  Patient was given broad-spectrum antibiotics including cefepime and Vanco as she is in a living facility.  Initial ABG shows hypokalemia, PaO2 of 58.  I had a discussion with the patient's son regarding her CODE STATUS.  She has a portable DNR at bedside.  I explained to him that this appears to be a respiratory issue at this time.  He stated that he felt like she would want to be intubated.  Patient states the same although she does have some mild Alzheimer's.  Given this collateral information, felt it was indicated for intubation.  Unfortunately, could not transition to noninvasive measures given unknown Covid status.  Chest x-ray is concerning for patchy airspace opacities concerning for multifocal infection, edema, or both.  EKG is unchanged.  Will consult critical care for further management.  Final Clinical Impressions(s) / ED Diagnoses   Final diagnoses:  Acute respiratory failure with hypoxia ([place])  Multifocal pneumonia  Suspected COVID-19 virus infection  Hypokalemia    ED Discharge Orders    None       Dina Rich, Barbette Hair, MD 10/11/19 0225

## 2019-10-11 NOTE — Progress Notes (Signed)
eLink Physician-Brief Progress Note Patient Name: Hannah Galvan DOB: October 14, 1931 MRN: [id number]   Date of Service  10/11/2019  HPI/Events of Note  Hypotension that was fluid responsive earlier today. Lactate 2.4,  Echo ivc findings consistent with underloading of the LV although Pt has cardiomyopathy with EF of 30 %.  eICU Interventions  NS 250 ml iv fluid bolus x 1        Hannah Galvan 10/11/2019, 9:15 PM

## 2019-10-11 NOTE — ED Triage Notes (Signed)
Pt from [facility] with ems for respiratory distress, staff making there rounds and noted pt was working hard to breathe. Room air sats in the 60s. Pt 90% on CPAP en route. Pt transferred to NRB at [facility] upon arrival to ED. Currently satting at 97%. Wheezing noted, ems gave 125 solu medrol, 2g mag en route. Pt alert upon arrival to ED, EMS reports pt would not open her eyes during transport, pt was responsive to verbal stimuli only.

## 2019-10-11 NOTE — Progress Notes (Signed)
eLink Physician-Brief Progress Note Patient Name: Hannah Galvan DOB: 03-Jul-1931 MRN: [id number]   Date of Service  10/11/2019  HPI/Events of Note  Hypotension despite 250 ml NS fluid bolus earlier.  eICU Interventions  NS 250 ml iv bolus repeated x 1, Phenylephrine infusion to be started if hypotension persists after second fluid bolus.        Kerry Kass [name] 10/11/2019, 11:02 PM

## 2019-10-11 NOTE — Progress Notes (Signed)
Initial Nutrition Assessment  DOCUMENTATION CODES:   Not applicable  INTERVENTION:    Vital AF 1.2 at 50 ml/h (1200 ml per day)   Provides 1440 kcal, 90 gm protein, 973 ml free water daily  NUTRITION DIAGNOSIS:   Inadequate oral intake related to inability to eat as evidenced by NPO status.  GOAL:   Patient will meet greater than or equal to 90% of their needs  MONITOR:   Vent status, Labs, TF tolerance, Skin  REASON FOR ASSESSMENT:   Ventilator, Consult Enteral/tube feeding initiation and management  ASSESSMENT:   85 yo female admitted with acute respiratory distress requiring intubation. COVID-19 negative. PMH includes dementia, hypothyroidism, HTN, HLD, breast cancer.   Discussed patient in ICU rounds and with RN today. Propofol is off due to decreasing BP. OG tube in place. Received MD Consult for TF initiation and management.  Patient is currently intubated on ventilator support MV: 7.2 L/min Temp (24hrs), Avg:99 F (37.2 C), Min:98 F (36.7 C), Max:100.9 F (38.3 C)   Labs reviewed.  CBG's: 150  Medications reviewed.   Recent weights reviewed. No significant weight changes noted.  NUTRITION - FOCUSED PHYSICAL EXAM:  unable to complete  Diet Order:   Diet Order            Diet NPO time specified  Diet effective now              EDUCATION NEEDS:   Not appropriate for education at this time  Skin:  Skin Assessment: Reviewed RN Assessment  Last BM:  no BM documented  Height:   Ht Readings from Last 1 Encounters:  10/11/19 5' 2.5" (1.588 m)    Weight:   Wt Readings from Last 1 Encounters:  10/11/19 65.3 kg    Ideal Body Weight:  51.1 kg  BMI:  Body mass index is 25.91 kg/m.  Estimated Nutritional Needs:   Kcal:  1425  Protein:  90-100 gm  Fluid:  >/= 1.5 L    Molli Barrows, RD, LDN, [place] Pager [phone] After Hours Pager [phone]

## 2019-10-12 ENCOUNTER — Inpatient Hospital Stay (HOSPITAL_COMMUNITY): Payer: Medicare Other

## 2019-10-12 ENCOUNTER — Encounter (HOSPITAL_COMMUNITY): Payer: Self-pay | Admitting: Physician Assistant

## 2019-10-12 DIAGNOSIS — I519 Heart disease, unspecified: Secondary | ICD-10-CM

## 2019-10-12 DIAGNOSIS — J9601 Acute respiratory failure with hypoxia: Secondary | ICD-10-CM

## 2019-10-12 DIAGNOSIS — R6521 Severe sepsis with septic shock: Secondary | ICD-10-CM

## 2019-10-12 DIAGNOSIS — A419 Sepsis, unspecified organism: Secondary | ICD-10-CM

## 2019-10-12 LAB — GLUCOSE, CAPILLARY
Glucose-Capillary: 118 mg/dL — ABNORMAL HIGH (ref 70–99)
Glucose-Capillary: 123 mg/dL — ABNORMAL HIGH (ref 70–99)
Glucose-Capillary: 89 mg/dL (ref 70–99)
Glucose-Capillary: 97 mg/dL (ref 70–99)
Glucose-Capillary: 54 mg/dL — ABNORMAL LOW (ref 70–99)
Glucose-Capillary: 85 mg/dL (ref 70–99)
Glucose-Capillary: 79 mg/dL (ref 70–99)

## 2019-10-12 LAB — CBC
HCT: 36.6 % (ref 36.0–46.0)
Hemoglobin: 12.1 g/dL (ref 12.0–15.0)
MCH: 32.2 pg (ref 26.0–34.0)
MCHC: 33.1 g/dL (ref 30.0–36.0)
MCV: 97.3 fL (ref 80.0–100.0)
Platelets: 157 10*3/uL (ref 150–400)
RBC: 3.76 MIL/uL — ABNORMAL LOW (ref 3.87–5.11)
RDW: 13.2 % (ref 11.5–15.5)
WBC: 15.9 10*3/uL — ABNORMAL HIGH (ref 4.0–10.5)
nRBC: 0 % (ref 0.0–0.2)

## 2019-10-12 LAB — HEPARIN LEVEL (UNFRACTIONATED)
Heparin Unfractionated: 0.49 IU/mL (ref 0.30–0.70)
Heparin Unfractionated: 0.25 IU/mL — ABNORMAL LOW (ref 0.30–0.70)

## 2019-10-12 LAB — TRIGLYCERIDES: Triglycerides: 52 mg/dL (ref <150)

## 2019-10-12 LAB — TROPONIN I (HIGH SENSITIVITY)
Troponin I (High Sensitivity): 511 ng/L (ref <18)
Troponin I (High Sensitivity): 474 ng/L (ref <18)

## 2019-10-12 MED ORDER — FUROSEMIDE 10 MG/ML IJ SOLN
20.0000 mg | Freq: Once | INTRAMUSCULAR | Status: AC
  Administered 2019-10-12: 20 mg via INTRAVENOUS
  Filled 2019-10-12: qty 2

## 2019-10-12 MED ORDER — LEVOTHYROXINE SODIUM 100 MCG/5ML IV SOLN
62.5000 ug | Freq: Every day | INTRAVENOUS | Status: DC
  Administered 2019-10-12 – 2019-10-14 (×3): 62.5 ug via INTRAVENOUS
  Filled 2019-10-12 (×4): qty 5

## 2019-10-12 MED ORDER — DEXTROSE 50 % IV SOLN
25.0000 g | INTRAVENOUS | Status: AC
  Administered 2019-10-12: 25 g via INTRAVENOUS

## 2019-10-12 MED ORDER — DEXTROSE 50 % IV SOLN
INTRAVENOUS | Status: AC
  Filled 2019-10-12: qty 50

## 2019-10-12 MED ORDER — PROPOFOL 1000 MG/100ML IV EMUL
5.0000 ug/kg/min | INTRAVENOUS | Status: DC
  Administered 2019-10-12: 35 ug/kg/min via INTRAVENOUS
  Administered 2019-10-12: 25 ug/kg/min via INTRAVENOUS
  Administered 2019-10-12: 02:00:00 5 ug/kg/min via INTRAVENOUS
  Administered 2019-10-13 – 2019-10-14 (×3): 25 ug/kg/min via INTRAVENOUS
  Filled 2019-10-12 (×8): qty 100

## 2019-10-12 NOTE — Progress Notes (Signed)
[facility] for Heparin Indication: chest pain/ACS  Allergies  Allergen Reactions  . Ambien [Zolpidem]     confusion  . Pyridium [Phenazopyridine Hcl] Other (See Comments)    hemolysis  . Sulfa Antibiotics Rash    Patient Measurements: Height: 5' 2.5" (158.8 cm) Weight: 157 lb 13.6 oz (71.6 kg) IBW/kg (Calculated) : 51.25 Heparin Dosing Weight: 64kg  Vital Signs: Temp: 97.6 F (36.4 C) (11/18 0704) Temp Source: Axillary (11/18 0704) BP: 125/71 (11/18 0815) Pulse Rate: 70 (11/18 0815)  Labs: Recent Labs    10/11/19 0021  10/11/19 0049 10/11/19 0246  10/11/19 0429 10/11/19 0525 10/11/19 1519 10/12/19 0231  HGB 16.3*   < >  --   --    < > 13.6 12.6  --  12.1  HCT 49.6*   < >  --   --    < > 41.8 37.0  --  36.6  PLT 185  --   --   --   --  146*  --   --  157  APTT  --   --   --   --   --  26  --   --   --   LABPROT  --   --   --   --   --  16.4*  --   --   --   INR  --   --   --   --   --  1.3*  --   --   --   HEPARINUNFRC  --   --   --   --   --   --   --  0.81* 0.49  CREATININE 1.13*  --  1.01*  --   --   --   --   --   --   TROPONINIHS 960*  --   --  1,191*  --   --   --   --   --    < > = values in this interval not displayed.    Estimated Creatinine Clearance: 37.5 mL/min (A) (by C-G formula based on SCr of 1.01 mg/dL (H)).   Medical History: Past Medical History:  Diagnosis Date  . Allergy   . Anxiety   . Arthritis   . Blood transfusion without reported diagnosis   . Breast cancer ([place])   . Cancer ([place])    breast, s/p RXT, bilateral  . Cataract   . History of echocardiogram    a. Echo 8/16: Vigorous LVF, EF 18-29%, grade 1 diastolic dysfunction, trivial AI, mild MR, mild RVE, normal RV function, PASP 33 mmHg // b. Echo 1/17 EF 65-70%, normal wall motion, grade 1 diastolic dysfunction, trivial AI, MAC, mild MR // c. Echo 11/26/16: mild LVH, EF 60-65, mild AI, mild MR, mild LAE, mild to mod TR  . History of stress test     Lexiscan Myoview 8/16:  EF 81%, breast attenuation, no ischemia; Low Risk // Myoview 11/26/16: EF 75, apical defect c/w soft tissue attenuation, no ischemia or scar; Low Risk  . HOCM (hypertrophic obstructive cardiomyopathy) ([place])   . Hyperlipidemia   . Hypertension   . Hypothyroidism   . Personal history of radiation therapy   . Shortness of breath   . Thyroid disease    Assessment: Hannah Galvan presenting from SNF in acute respiratory distress 2/2 pna, with increasing troponin 960>1191 and concern for ACS.  Not on anticoagulation PTA. Pt with AKI, Scr improving.  HL currently  therapeutic at 0.49. Hg, PLT wnl. No documented signs or bleeding.  Goal of Therapy:  Heparin level 0.3-0.7 units/ml Monitor platelets by anticoagulation protocol: Yes   Plan:  Copntinue heparin at 650 units/hr -Recheck heparin level and CBC with AM labs. Continue to trend troponin for decrease.  Onnie Boer; PharmD Candidate

## 2019-10-12 NOTE — Progress Notes (Signed)
NAME:  Hannah Galvan, MRN:  [id number], DOB:  09/15/1931, LOS: 1 ADMISSION DATE:  10/11/2019, CONSULTATION DATE:  11/17 REFERRING MD:  Dr. Dina Rich, CHIEF COMPLAINT:  Acute Respiratory failure   Brief History   85 year old female who presented from outside skilled nursing facility with acute respiratory distress, COVID-19 negative on admission.  Patient suffered worsening respiratory distress in emergency department and was subsequently intubated.  PCCM consulted for admission  History of present illness   85 year old female with extensive past medical history including but not limited to hypertrophic obstructive cardiomyopathy and advanced dementia who to the emergency department via EMS from local skilled nursing facility in acute respiratory distress.  According to chart review patient was found on a nightly rounds with increased respiratory distress and work of breathing with an oxygen saturation of 60% on room air. She was placed on CPAP by EMS with improvement in saturations patient reports she did report that she was mildly short of breath prior to going to bed tonight.    Current lab work impressive for lactic acidosis, leukocytosis, elevated BNP, and hypokalemia.  Chest x-ray with signs of early opacities concerning for pulmonary edema versus multifocal infection.  While in the emergency department due to concern for COVID-19 patient was removed from NIV therapy with eventual return of increased work of breathing and signs of respiratory fatigue prompting need for endotracheal intubation.  PCCM consulted for admission  Past Medical History   has a past medical history of Allergy, Anxiety, Arthritis, Blood transfusion without reported diagnosis, Breast cancer ([place]), Cancer ([place]), Cataract, History of echocardiogram, History of stress test, HOCM (hypertrophic obstructive cardiomyopathy) ([place]), Hyperlipidemia, Hypertension, Hypothyroidism, Personal history of radiation therapy, Shortness of  breath, and Thyroid disease.   Significant Hospital Events   11/17 admitted  Consults:  none  Procedures:  11/17 - ETT  Significant Diagnostic Tests:  CXR - diffuse heterogeneous mixed interstitial and patchy airspace opacities, right greater than the left.  Findings represent possible pulmonary edema, multifocal infection, or combination.  Small bilateral pleural effusions.  Micro Data:  [id number]-[id number] 11/17 >> negative Blood culture 11/17 >> Urine culture 1117 >> Respiratory virus panel 11/17 >> Sputum culture 11/17 >> MRSA PCR 11/17 >>  Antimicrobials:  Cefepime 11/17 >> Vancomycin 11/17 >> Doxycycline 11/17 >>  Interim history/subjective:  Patient is intubated. Pt trying to communicate verbally, but unable to d/t intubated status.    Objective   Blood pressure 125/71, pulse 70, temperature 97.6 F (36.4 C), temperature source Axillary, resp. rate (!) 33, height 5' 2.5" (1.588 m), weight 71.6 kg, SpO2 98 %.    Vent Mode: PRVC FiO2 (%):  [40 %-80 %] 40 % Set Rate:  [16 bmp] 16 bmp Vt Set:  [410 mL] 410 mL PEEP:  [5 cmH20] 5 cmH20 Plateau Pressure:  [15 cmH20-24 cmH20] 20 cmH20   Intake/Output Summary (Last 24 hours) at 10/12/2019 1104 Last data filed at 10/12/2019 0600 Gross per 24 hour  Intake 2624.57 ml  Output 100 ml  Net 2524.57 ml   Filed Weights   10/11/19 0049 10/12/19 0500  Weight: 65.3 kg 71.6 kg    Examination: General: awake.  Trying to communicate. Does not appear in acute distress.  HENT: moist oral mucosa.  Lungs: ventilator sounds auscultated. Crackles heard left side.   Cardiovascular: regular rhythm. Normal rate. 1+ radial pulse Abdomen: soft, nontender. Normal bowel sounds.  Extremities: 2+ pitting edema on the feet bilaterally.   Neuro: unable to assess d/t pt condition  Resolved Hospital  Problem list   none  Assessment & Plan:  ASSESSMENT / PLAN:  PULMONARY A:  Acute hypoxic respiratory failure : CAP vs   Pt currently intubated.  CXR showed interstitial/patchy airspace opacities possibly d/t pulm edema and/or multifocal infection. PCT 0.15. leukocytosis (15.9), currently afebrile (last fever 100.9 on 11/17 0030)  Pt currently on broad spec abx. Pt also has elevated troponin, BNP (1,074), and pitting edema bilaterally.  Echo read on 11/17 showed EF 25-30% with severe hypokenesis.   P:   - continue broad spec abx. Consider de-escalating. Consider removing vanc given MRSA pcr negative.    NEUROLOGIC A:   Awake.  No focal neural deficits. Pt has advanced dementia at baseline. On zoloft, ativan, aricept   P:   - hold home medications  VASCULAR A:   Pt on hypertensive medication at home including: norvasc, cozaar, hctz. Pt presented hypotensive and currently on phenylephrine gtt.   P:  - continue phenylephrine. Wean as tolerated.  - hold home antihypertensive medications.   CARDIAC STRUCTURAL A: Patient has a reported hx of hypertrophic obstructive cardiomyopathy.  Echo on 11/2017 showed EF 55-60%, mild LVH, normal wall motion, G1DD. Echo on 10/11/19 showed EF 25-30%, severely decreased LV function, G1DD, akinesis of atneroseptal wall and apex.,  normal RV function. This, along with elevated BNP and edema/respiratory distress, suggest acute worsening of heart failure. This likely explains elevated troponins (demand ischemia)  P: - consider heart failure consult - consider lasix for reducing volume overload.  - f/u trop  CARDIAC ELECTRICAL A: No ST changes seen on EKG. QTc is 507  P: - f/u trops - Continuous cardiac monitoring - avoid QTc prolonging medications if possible.   INFECTIOUS A:   Possible respiratory infection. Pt met sepsis criteria on admission: tachypnea, fever, leukocytosis. Cultures pending and broad spec abx started. PCT normal, LA was elevated.   P:   - continue abx for possible pulm infection. Narrow as able.  - f/u urine, blood cultures.  - am cbc and bmp - f/u urine strep and and  legionella.   RENAL A:  Cr slightly elevated at 1.13. no hx of CKD. Likely 2/2 to ATN from hypoperfusion related to heart failure. Minimal urine output since admission.  P:  - consider starting lasix for volume overload.   ELECTROLYTES A:  Currently normal other than low hypocalcemia.  P: - daily bmp   GASTROINTESTINAL A:   No gastrointestinal complaints.   P:   - continue to monitor.  - protonix daily   HEMATOLOGIC A:  Normal Hgb.  Leukocytosis of 15.9.   P:  - continue to monitor cbc - PRBC for hgb </= 6.9gm%    - exceptions are   -  if ACS susepcted/confirmed then transfuse for hgb </= 8.0gm%,  or    -  active bleeding with hemodynamic instability, then transfuse regardless of hemoglobin value   At at all times try to transfuse 1 unit prbc as possible with exception of active hemorrhage   ENDOCRINE A:   Patient has hypothyroidism.  Takes synthroid at home.    P:   - restart home synthroid. Reduce dose if IV synthroid is necessary.   MSK/DERM No concerns.    Best practice:  Diet: npo Pain/Anxiety/Delirium protocol (if indicated): as needed fentanyl and versed VAP protocol (if indicated):  In place DVT prophylaxis: subQ hep GI prophylaxis: protonix Glucose control: ssi Mobility: bedrest Code Status: limited code blue intubation only  Family Communication: son updated by ED physician.  Continue to update.  Disposition: ICU  Labs   CBC: Recent Labs  Lab 10/11/19 0021 10/11/19 0043 10/11/19 0247 10/11/19 0429 10/11/19 0525 10/12/19 0231  WBC 18.6*  --   --  15.3*  --  15.9*  NEUTROABS 16.0*  --   --   --   --   --   HGB 16.3* 15.3* 12.2 13.6 12.6 12.1  HCT 49.6* 45.0 36.0 41.8 37.0 36.6  MCV 96.7  --   --  98.6  --  97.3  PLT 185  --   --  146*  --  588    Basic Metabolic Panel: Recent Labs  Lab 10/11/19 0021 10/11/19 0043 10/11/19 0049 10/11/19 0247 10/11/19 0429 10/11/19 0525  NA 139 138 140 141  --  140  K 3.1* 2.7* 3.6 3.4*  --   4.5  CL 104  --  110  --   --   --   CO2 21*  --  20*  --   --   --   GLUCOSE 196*  --  160*  --   --   --   BUN 21  --  21  --   --   --   CREATININE 1.13*  --  1.01*  --   --   --   CALCIUM 9.1  --  7.6*  --   --   --   MG  --   --   --   --  2.3  --   PHOS  --   --   --   --  3.2  --    GFR: Estimated Creatinine Clearance: 37.5 mL/min (A) (by C-G formula based on SCr of 1.01 mg/dL (H)). Recent Labs  Lab 10/11/19 0021 10/11/19 0246 10/11/19 0429 10/12/19 0231  PROCALCITON  --  0.15  --   --   WBC 18.6*  --  15.3* 15.9*  LATICACIDVEN 3.0* 2.4*  --   --     Liver Function Tests: Recent Labs  Lab 10/11/19 0049  AST 46*  ALT 22  ALKPHOS 38  BILITOT 1.1  PROT 5.3*  ALBUMIN 3.1*   No results for input(s): LIPASE, AMYLASE in the last 168 hours. No results for input(s): AMMONIA in the last 168 hours.  ABG    Component Value Date/Time   PHART 7.298 (L) 10/11/2019 0525   PCO2ART 39.6 10/11/2019 0525   PO2ART 121.0 (H) 10/11/2019 0525   HCO3 19.1 (L) 10/11/2019 0525   TCO2 20 (L) 10/11/2019 0525   ACIDBASEDEF 6.0 (H) 10/11/2019 0525   O2SAT 98.0 10/11/2019 0525     Coagulation Profile: Recent Labs  Lab 10/11/19 0429  INR 1.3*    Cardiac Enzymes: No results for input(s): CKTOTAL, CKMB, CKMBINDEX, TROPONINI in the last 168 hours.  HbA1C: Hgb A1c MFr Bld  Date/Time Value Ref Range Status  12/20/2018 04:56 PM 5.5 <5.7 % of total Hgb Final    Comment:    For the purpose of screening for the presence of diabetes: . <5.7%       Consistent with the absence of diabetes 5.7-6.4%    Consistent with increased risk for diabetes             (prediabetes) > or =6.5%  Consistent with diabetes . This assay result is consistent with a decreased risk of diabetes. . Currently, no consensus exists regarding use of hemoglobin A1c for diagnosis of diabetes in children. . According to American Diabetes Association (ADA) guidelines,  hemoglobin A1c <7.0% represents optimal  control in non-pregnant diabetic patients. Different metrics may apply to specific patient populations.  Standards of Medical Care in Diabetes(ADA). Marland Kitchen   06/02/2012 03:11 PM 5.4 <5.7 % Final    Comment:                                                                           According to the ADA Clinical Practice Recommendations for 2011, when HbA1c is used as a screening test:     >=6.5%   Diagnostic of Diabetes Mellitus            (if abnormal result is confirmed)   5.7-6.4%   Increased risk of developing Diabetes Mellitus   References:Diagnosis and Classification of Diabetes Mellitus,Diabetes XKGY,1856,31(SHFWY 1):S62-S69 and Standards of Medical Care in         Diabetes - 2011,Diabetes OVZC,5885,02 (Suppl 1):S11-S61.      CBG: Recent Labs  Lab 10/11/19 1552 10/11/19 1932 10/11/19 2328 10/12/19 0348 10/12/19 0703  GLUCAP 112* 138* 128* 118* 123*    Review of Systems:   Unable to obtain   Past Medical History  She,  has a past medical history of Allergy, Anxiety, Arthritis, Blood transfusion without reported diagnosis, Breast cancer ([place]), Cancer ([place]), Cataract, History of echocardiogram, History of stress test, HOCM (hypertrophic obstructive cardiomyopathy) ([place]), Hyperlipidemia, Hypertension, Hypothyroidism, Personal history of radiation therapy, Shortness of breath, and Thyroid disease.   Surgical History    Past Surgical History:  Procedure Laterality Date  . ABDOMINAL HYSTERECTOMY  2008  . bladder tact    . BREAST LUMPECTOMY Right 2000  . BREAST LUMPECTOMY Left 2007  . CATARACT EXTRACTION  2011  . JOINT REPLACEMENT    . KNEE SURGERY     right  . ROTATOR CUFF REPAIR     right  . THYROID SURGERY  2002  . TOTAL KNEE ARTHROPLASTY Left 06/02/2013   Dr Rhona Raider  . TOTAL KNEE ARTHROPLASTY Right 06/02/2013   Procedure: RIGHT TOTAL KNEE ARTHROPLASTY;  Surgeon: Hessie Dibble, MD;  Location: [facility];  Service: Orthopedics;  Laterality: Right;  . TOTAL KNEE  ARTHROPLASTY Left 08/22/2014   Procedure: TOTAL KNEE ARTHROPLASTY;  Surgeon: Hessie Dibble, MD;  Location: [facility];  Service: Orthopedics;  Laterality: Left;     Social History   reports that she has never smoked. She has never used smokeless tobacco. She reports that she does not drink alcohol or use drugs.   Family History   Her family history includes Cancer in her brother, brother, brother, brother, brother, father, sister, sister, and sister; Heart disease in her sister; Hypertension in her mother; Stroke in her mother.   Allergies Allergies  Allergen Reactions  . Ambien [Zolpidem]     confusion  . Pyridium [Phenazopyridine Hcl] Other (See Comments)    hemolysis  . Sulfa Antibiotics Rash     Home Medications  Prior to Admission medications   Medication Sig Start Date End Date Taking? Authorizing Provider  acetaminophen (TYLENOL) 325 MG tablet TAKE 1 TAB BY MOUTH EVERY 6 HOURS AS NEEDED FOR MILD PAIN Patient taking differently: Take 325 mg by mouth every 6 (six) hours as needed for mild pain.  12/22/18  Yes Pickard,  Cammie Mcgee, MD  albuterol (PROVENTIL HFA;VENTOLIN HFA) 108 (90 Base) MCG/ACT inhaler Inhale 2 puffs into the lungs every 4 (four) hours as needed for wheezing or shortness of breath. 01/11/19  Yes Aline August, MD  amLODipine (NORVASC) 5 MG tablet Take 1 tablet (5 mg total) by mouth daily. 02/21/19  Yes Susy Frizzle, MD  ARIPiprazole (ABILIFY) 2 MG tablet Take 1 tablet (2 mg total) by mouth daily. 09/13/19  Yes Susy Frizzle, MD  aspirin 81 MG tablet Take 81 mg by mouth daily.   Yes [provider]  Calcium Carbonate-Vit D-Min (GNP CALCIUM 1200) 1200-1000 MG-UNIT CHEW 1 tab PO QAM Patient taking differently: Chew 1 tablet by mouth daily.  08/05/17  Yes Susy Frizzle, MD  cholecalciferol (VITAMIN D) 1000 units tablet Take 1,000 Units by mouth daily.   Yes [provider]  Cranberry 500 MG TABS Take 500 mg by mouth daily.    Yes [provider]  donepezil (ARICEPT) 5 MG tablet Take 1 tablet (5 mg total) by mouth at bedtime. 02/21/19  Yes Susy Frizzle, MD  exemestane (AROMASIN) 25 MG tablet TAKE 1 TAB BY MOUTH EVERY DAY Patient taking differently: Take 25 mg by mouth daily after breakfast.  12/13/18  Yes Pickard, Cammie Mcgee, MD  folic acid (FOLVITE) 1 MG tablet TAKE 1 TAB BY MOUTH EVERY DAY Patient taking differently: Take 1 mg by mouth daily.  02/21/19  Yes Susy Frizzle, MD  hydrochlorothiazide (HYDRODIURIL) 25 MG tablet Take 1 tablet (25 mg total) by mouth daily. 02/21/19  Yes Susy Frizzle, MD  levothyroxine (SYNTHROID, LEVOTHROID) 125 MCG tablet TAKE 1 TAB BY MOUTH EVERY DAY Patient taking differently: Take 125 mcg by mouth daily before breakfast.  02/21/19  Yes Pickard, Cammie Mcgee, MD  LORazepam (ATIVAN) 0.5 MG tablet Take 0.5 mg by mouth every 8 (eight) hours as needed for anxiety.   Yes [provider]  LORazepam (ATIVAN) 1 MG tablet 1 tab po qhs Patient taking differently: Take 1 mg by mouth at bedtime.  08/16/19  Yes Susy Frizzle, MD  losartan (COZAAR) 100 MG tablet TAKE 1 TAB BY MOUTH EVERY DAY FOR HTN Patient taking differently: Take 100 mg by mouth daily.  02/21/19  Yes Susy Frizzle, MD  metoprolol succinate (TOPROL-XL) 25 MG 24 hr tablet Take 1 tablet (25 mg total) by mouth daily. 02/21/19  Yes Susy Frizzle, MD  Multiple Vitamins-Minerals (CENTRUM SILVER ADULT 50+ PO) Take 1 tablet by mouth daily.    Yes [provider]  nitroGLYCERIN (NITROSTAT) 0.4 MG SL tablet Place 1 tablet (0.4 mg total) under the tongue every 5 (five) minutes as needed for chest pain. 11/29/15  Yes Weaver, Scott T, PA-C  potassium chloride SA (KLOR-CON M20) 20 MEQ tablet Take 1 tablet (20 mEq total) by mouth daily. 02/21/19  Yes Susy Frizzle, MD  rosuvastatin (CRESTOR) 10 MG tablet TAKE 1 TABLET BY MOUTH EVERY DAY AT 6PM Patient taking differently: Take 10 mg by mouth every evening.  02/21/19  Yes  Susy Frizzle, MD  sertraline (ZOLOFT) 100 MG tablet Take 1 tablet (100 mg total) by mouth daily. 02/21/19  Yes Susy Frizzle, MD  triamcinolone cream (KENALOG) 0.1 % Apply 1 application topically 2 (two) times daily. Patient taking differently: Apply 1 application topically 2 (two) times daily as needed (for rash).  02/16/18  Yes Susy Frizzle, MD  dextromethorphan-guaiFENesin [facility] DM) 30-600 MG 12hr tablet Take 1 tablet  by mouth 2 (two) times daily. Patient not taking: Reported on 10/11/2019 01/11/19   Aline August, MD     Critical care time: 5

## 2019-10-12 NOTE — Progress Notes (Signed)
Cardiology consulted for CHF exacerbation with a hx of HOCM.

## 2019-10-12 NOTE — Progress Notes (Signed)
eLink Physician-Brief Progress Note Patient Name: Hannah Galvan DOB: 1931-11-01 MRN: [id number]   Date of Service  10/12/2019  HPI/Events of Note  Pt needs additional sedation, she is agitated.  eICU Interventions  Propofol infusion ordered.        Kerry Kass Gwenlyn Hottinger 10/12/2019, 1:37 AM

## 2019-10-12 NOTE — Consult Note (Addendum)
Cardiology Consultation:   Due to the COVID-19 pandemic, this visit was completed remotely to reduce patient and provider exposure as well as to preserve personal protective equipment.   Patient ID: Hannah Galvan MRN: [id number]; DOB: 11-May-1931  Admit date: 10/11/2019 Date of Consult: 10/12/2019  Primary Care Provider: Susy Frizzle, MD Primary Cardiologist: New to [place] Burow, MD (previously Dr. Mare Ferrari) Primary Electrophysiologist:  None   Patient Profile:   Hannah Galvan is a 85 y.o. female with a hx of recent advancing dementia, HTN, HL, HOCM physiology on 2018 echo, hypothyroidism, palpitations, breast CA (R in 2000, L in 2007), HL (intolerant to Crestor), probable CKD stage III by labs (GFR 50-58 in 2020) who is being seen today for the evaluation of systolic CHF at the request of Dr. Jeannine Kitten.  History of Present Illness:   She has been followed by cardiology for prior palpitations and chest pain. She had prior nuclear stress testing in 2016 and 2018 without ischemia. She had an event monitor in 2016 for palpitations with suggestion of atrial fib. However, after monitor was reviewed at completion, what was initially felt to be atrial fib was actually NSR with PACs and artifact. She was known to have a transient LBBB in the past. She was last seen in our office in 2017. A 2D echo in 11/2017 ordered by primary care for shortness of breath showed EF 22-63%, grade 1 diastolic dysfunction, mild LVHwith proximal septal thickening, systolic anterior motion ofmitral valve with severely elevated LVOT gradient (approximately6 m/s); mild MR, mild TR with mildly elevated pulmonary pressure,findings c/w HOCM physiology. In light of comorbidities she was managed conservatively by PCP. She saw Dr. Dennard Schaumann recently for worsening symptoms of behavior disturbance, delirium and hallucinations felt to be due to underlying depression with superimposed depression related to isolation from Covid-19  pandemic.  In the meantime she was transferred from a local skilled nursing facility on 11/17 with acute hypoxic respiratory failure. According to chart review patient was found on a nightly rounds with increased respiratory distress and work of breathing with an oxygen saturation of 60% on room air. She was placed on CPAP by EMS. Tmax 100.9. Initial labs revealed lactic acidosis, leukocytosis, hypokalemia, AKI with Cr 1.13, and hsTroponin 960->1191 with pBNP 1074. Initial CXR with small bilateral pleural effusions, aortic atherosclerosis and mixed interstitial and patchy airspace opacities, right greater than left, may represent pulmonary edema, multifocal infection, or combination thereof. She was originally a DNR but it sounds like patient's son and patient rescinded this given acute illness. She was intubated and placed on antibiotics for suspected sepsis/HCAP and pressors for severe hypotension. Covid-19 swab negative. As part of her evaluation she had an echocardiogram yesterday showing akinesis of the mid/distal anteroseptal wall and apex with overall severe LV dysfunction EF 25-30%, grade 1 DD, normal RV, mild LAE, mild-moderate mitral regurgitaiton, mild TR, chordal SAM with severely elevated LVOT gradient (6 m/s) and mild pulmonary HTN. She remains on Neosynephrine.   Past Medical History:  Diagnosis Date   Allergy    Anxiety    Arthritis    Blood transfusion without reported diagnosis    Breast cancer ([place])    breast, s/p RXT, bilateral   Cataract    CKD (chronic kidney disease), stage III    Delirium    Dementia ([place])    History of echocardiogram    a. Echo 8/16: Vigorous LVF, EF 33-54%, grade 1 diastolic dysfunction, trivial AI, mild MR, mild RVE, normal RV function, PASP  33 mmHg // b. Echo 1/17 EF 65-70%, normal wall motion, grade 1 diastolic dysfunction, trivial AI, MAC, mild MR // c. Echo 11/26/16: mild LVH, EF 60-65, mild AI, mild MR, mild LAE, mild to mod TR   History  of stress test    Lexiscan Myoview 8/16:  EF 81%, breast attenuation, no ischemia; Low Risk // Myoview 11/26/16: EF 75, apical defect c/w soft tissue attenuation, no ischemia or scar; Low Risk   HOCM (hypertrophic obstructive cardiomyopathy) ([place])    Hyperlipidemia    Hypertension    Hypothyroidism    Personal history of radiation therapy    Premature atrial contractions     Past Surgical History:  Procedure Laterality Date   ABDOMINAL HYSTERECTOMY  2008   bladder tact     BREAST LUMPECTOMY Right 2000   BREAST LUMPECTOMY Left 2007   CATARACT EXTRACTION  2011   JOINT REPLACEMENT     KNEE SURGERY     right   ROTATOR CUFF REPAIR     right   THYROID SURGERY  2002   TOTAL KNEE ARTHROPLASTY Left 06/02/2013   Dr Rhona Raider   TOTAL KNEE ARTHROPLASTY Right 06/02/2013   Procedure: RIGHT TOTAL KNEE ARTHROPLASTY;  Surgeon: Hessie Dibble, MD;  Location: [facility];  Service: Orthopedics;  Laterality: Right;   TOTAL KNEE ARTHROPLASTY Left 08/22/2014   Procedure: TOTAL KNEE ARTHROPLASTY;  Surgeon: Hessie Dibble, MD;  Location: [facility];  Service: Orthopedics;  Laterality: Left;     Home Medications:  Prior to Admission medications   Medication Sig Start Date End Date Taking? Authorizing Provider  acetaminophen (TYLENOL) 325 MG tablet TAKE 1 TAB BY MOUTH EVERY 6 HOURS AS NEEDED FOR MILD PAIN Patient taking differently: Take 325 mg by mouth every 6 (six) hours as needed for mild pain.  12/22/18  Yes Susy Frizzle, MD  albuterol (PROVENTIL HFA;VENTOLIN HFA) 108 (90 Base) MCG/ACT inhaler Inhale 2 puffs into the lungs every 4 (four) hours as needed for wheezing or shortness of breath. 01/11/19  Yes Aline August, MD  amLODipine (NORVASC) 5 MG tablet Take 1 tablet (5 mg total) by mouth daily. 02/21/19  Yes Susy Frizzle, MD  ARIPiprazole (ABILIFY) 2 MG tablet Take 1 tablet (2 mg total) by mouth daily. 09/13/19  Yes Susy Frizzle, MD  aspirin 81 MG tablet Take 81 mg by mouth  daily.   Yes [provider]  Calcium Carbonate-Vit D-Min (GNP CALCIUM 1200) 1200-1000 MG-UNIT CHEW 1 tab PO QAM Patient taking differently: Chew 1 tablet by mouth daily.  08/05/17  Yes Susy Frizzle, MD  cholecalciferol (VITAMIN D) 1000 units tablet Take 1,000 Units by mouth daily.   Yes [provider]  Cranberry 500 MG TABS Take 500 mg by mouth daily.    Yes [provider]  donepezil (ARICEPT) 5 MG tablet Take 1 tablet (5 mg total) by mouth at bedtime. 02/21/19  Yes Susy Frizzle, MD  exemestane (AROMASIN) 25 MG tablet TAKE 1 TAB BY MOUTH EVERY DAY Patient taking differently: Take 25 mg by mouth daily after breakfast.  12/13/18  Yes Pickard, Cammie Mcgee, MD  folic acid (FOLVITE) 1 MG tablet TAKE 1 TAB BY MOUTH EVERY DAY Patient taking differently: Take 1 mg by mouth daily.  02/21/19  Yes Susy Frizzle, MD  hydrochlorothiazide (HYDRODIURIL) 25 MG tablet Take 1 tablet (25 mg total) by mouth daily. 02/21/19  Yes Susy Frizzle, MD  levothyroxine (SYNTHROID, LEVOTHROID) 125 MCG tablet TAKE 1 TAB  BY MOUTH EVERY DAY Patient taking differently: Take 125 mcg by mouth daily before breakfast.  02/21/19  Yes Pickard, Cammie Mcgee, MD  LORazepam (ATIVAN) 0.5 MG tablet Take 0.5 mg by mouth every 8 (eight) hours as needed for anxiety.   Yes [provider]  LORazepam (ATIVAN) 1 MG tablet 1 tab po qhs Patient taking differently: Take 1 mg by mouth at bedtime.  08/16/19  Yes Susy Frizzle, MD  losartan (COZAAR) 100 MG tablet TAKE 1 TAB BY MOUTH EVERY DAY FOR HTN Patient taking differently: Take 100 mg by mouth daily.  02/21/19  Yes Susy Frizzle, MD  metoprolol succinate (TOPROL-XL) 25 MG 24 hr tablet Take 1 tablet (25 mg total) by mouth daily. 02/21/19  Yes Susy Frizzle, MD  Multiple Vitamins-Minerals (CENTRUM SILVER ADULT 50+ PO) Take 1 tablet by mouth daily.    Yes [provider]  nitroGLYCERIN (NITROSTAT) 0.4 MG SL tablet Place 1 tablet (0.4 mg  total) under the tongue every 5 (five) minutes as needed for chest pain. 11/29/15  Yes Weaver, Scott T, PA-C  potassium chloride SA (KLOR-CON M20) 20 MEQ tablet Take 1 tablet (20 mEq total) by mouth daily. 02/21/19  Yes Susy Frizzle, MD  rosuvastatin (CRESTOR) 10 MG tablet TAKE 1 TABLET BY MOUTH EVERY DAY AT 6PM Patient taking differently: Take 10 mg by mouth every evening.  02/21/19  Yes Susy Frizzle, MD  sertraline (ZOLOFT) 100 MG tablet Take 1 tablet (100 mg total) by mouth daily. 02/21/19  Yes Susy Frizzle, MD  triamcinolone cream (KENALOG) 0.1 % Apply 1 application topically 2 (two) times daily. Patient taking differently: Apply 1 application topically 2 (two) times daily as needed (for rash).  02/16/18  Yes Susy Frizzle, MD  dextromethorphan-guaiFENesin [facility] DM) 30-600 MG 12hr tablet Take 1 tablet by mouth 2 (two) times daily. Patient not taking: Reported on 10/11/2019 01/11/19   Aline August, MD    Inpatient Medications: Scheduled Meds:  chlorhexidine gluconate (MEDLINE KIT)  15 mL Mouth Rinse BID   Chlorhexidine Gluconate Cloth  6 each Topical Daily   levothyroxine  62.5 mcg Intravenous Daily   mouth rinse  15 mL Mouth Rinse 10 times per day   pantoprazole (PROTONIX) IV  40 mg Intravenous QHS   Continuous Infusions:  sodium chloride 10 mL/hr at 10/12/19 0000   ceFEPime (MAXIPIME) IV Stopped (10/12/19 1258)   doxycycline (VIBRAMYCIN) IV Stopped (10/12/19 2683)   feeding supplement (VITAL AF 1.2 CAL) 1,000 mL (10/11/19 1755)   heparin 650 Units/hr (10/12/19 1500)   phenylephrine (NEO-SYNEPHRINE) Adult infusion 10 mcg/min (10/12/19 1500)   propofol (DIPRIVAN) infusion 25 mcg/kg/min (10/12/19 1500)   PRN Meds: sodium chloride, acetaminophen, fentaNYL (SUBLIMAZE) injection, midazolam  Allergies:    Allergies  Allergen Reactions   Ambien [Zolpidem]     confusion   Pyridium [Phenazopyridine Hcl] Other (See Comments)    hemolysis   Sulfa  Antibiotics Rash    Social History:   Social History   Socioeconomic History   Marital status: Widowed    Spouse name: Not on file   Number of children: Not on file   Years of education: Not on file   Highest education level: Not on file  Occupational History   Not on file  Social Needs   Financial resource strain: Not on file   Food insecurity    Worry: Not on file    Inability: Not on file   Transportation needs    Medical:  Not on file    Non-medical: Not on file  Tobacco Use   Smoking status: Never Smoker   Smokeless tobacco: Never Used   Tobacco comment: never used tobacco  Substance and Sexual Activity   Alcohol use: No    Alcohol/week: 0.0 standard drinks   Drug use: No   Sexual activity: Not Currently  Lifestyle   Physical activity    Days per week: Not on file    Minutes per session: Not on file   Stress: Not on file  Relationships   Social connections    Talks on phone: Not on file    Gets together: Not on file    Attends religious service: Not on file    Active member of club or organization: Not on file    Attends meetings of clubs or organizations: Not on file    Relationship status: Not on file   Intimate partner violence    Fear of current or ex partner: Not on file    Emotionally abused: Not on file    Physically abused: Not on file    Forced sexual activity: Not on file  Other Topics Concern   Not on file  Social History Narrative   Not on file    Family History:    Family History  Problem Relation Age of Onset   Hypertension Mother    Stroke Mother    Cancer Father    Cancer Sister        leukemia   Cancer Brother        leukemia   Cancer Sister        vaginal   Cancer Sister        bladder   Cancer Brother        prostate, skin   Cancer Brother        colon   Cancer Brother        prostate, bone mets   Cancer Brother        skin   Heart disease Sister      ROS:  Unable to obtain -  intubated, sedated  Physical Exam/Data:   Vitals:   10/12/19 1300 10/12/19 1400 10/12/19 1500 10/12/19 1521  BP: (!) 93/45 (!) 93/51 113/80   Pulse: 65 (!) 50 (!) 101   Resp: 16 16 (!) 25   Temp:    97.8 F (36.6 C)  TempSrc:    Axillary  SpO2: 100% 100% 90%   Weight:      Height:        Intake/Output Summary (Last 24 hours) at 10/12/2019 1609 Last data filed at 10/12/2019 1500 Gross per 24 hour  Intake 2980.75 ml  Output 500 ml  Net 2480.75 ml   Last 3 Weights 10/12/2019 10/11/2019 09/01/2019  Weight (lbs) 157 lb 13.6 oz 143 lb 15.4 oz 144 lb  Weight (kg) 71.6 kg 65.3 kg 65.318 kg     Body mass index is 28.41 kg/m.   BP (!) 100/56    Pulse 72    Temp 97.8 F (36.6 C) (Axillary)    Resp (!) 23    Ht 5' 2.5" (1.588 m)    Wt 71.6 kg    SpO2 100%    BMI 28.41 kg/m  Discussed plan for examination with Dr. Gwenlyn Found - given SNF patient with hypoxia and multifocal PNA who is intubated/sedated and unable to provide a history, it was not felt necessary for both providers to go in to examine the patient. Dr.  Dasani Thurlow did not feel physical examination was required for his clinical recommendations.  EKG:  The EKG was personally reviewed and demonstrates: Sinus tach 101bpm with baseline artifact making interpretation challenging - suspect occasional PACs contributing to irregularity, LBBB  Telemetry:  Telemetry was personally reviewed and demonstrates:  NSR with occasional PACs  Relevant CV Studies: 2d echo 10/12/19 IMPRESSIONS   1. Left ventricular ejection fraction, by visual estimation, is 25 to 30%. The left ventricle has severely decreased function. There is no left ventricular hypertrophy. 2. Elevated left atrial pressure. 3. Left ventricular diastolic parameters are consistent with Grade I diastolic dysfunction (impaired relaxation). 4. Global right ventricle has normal systolic function.The right ventricular size is normal. 5. Left atrial size was mildly dilated. 6.  Right atrial size was normal. 7. Mild mitral annular calcification. 8. The mitral valve is normal in structure. Mild to moderate mitral valve regurgitation. No evidence of mitral stenosis. 9. The tricuspid valve is normal in structure. Tricuspid valve regurgitation is mild. 10. The aortic valve is normal in structure. Aortic valve regurgitation is not visualized. No evidence of aortic valve sclerosis or stenosis. 11. The pulmonic valve was not well visualized. Pulmonic valve regurgitation is trivial. 12. The inferior vena cava is normal in size with greater than 50% respiratory variability, suggesting right atrial pressure of 3 mmHg. 13. Akinesis of the mid/distal anteroseptal wall and apex with overall severe LV dysfunction; grade 1 diastolic dysfunction; chordal SAM with severely elevated LVOT gradient (6 m/s); mild to moderate MR; mild LAE; mild TR with mild pulmonary  hypertension.    Laboratory Data:  Chemistry Recent Labs  Lab 10/11/19 0021  10/11/19 0049 10/11/19 0247 10/11/19 0525  NA 139   < > 140 141 140  K 3.1*   < > 3.6 3.4* 4.5  CL 104  --  110  --   --   CO2 21*  --  20*  --   --   GLUCOSE 196*  --  160*  --   --   BUN 21  --  21  --   --   CREATININE 1.13*  --  1.01*  --   --   CALCIUM 9.1  --  7.6*  --   --   GFRNONAA 44*  --  50*  --   --   GFRAA 51*  --  58*  --   --   ANIONGAP 14  --  10  --   --    < > = values in this interval not displayed.    Recent Labs  Lab 10/11/19 0049  PROT 5.3*  ALBUMIN 3.1*  AST 46*  ALT 22  ALKPHOS 38  BILITOT 1.1   Hematology Recent Labs  Lab 10/11/19 0021  10/11/19 0429 10/11/19 0525 10/12/19 0231  WBC 18.6*  --  15.3*  --  15.9*  RBC 5.13*  --  4.24  --  3.76*  HGB 16.3*   < > 13.6 12.6 12.1  HCT 49.6*   < > 41.8 37.0 36.6  MCV 96.7  --  98.6  --  97.3  MCH 31.8  --  32.1  --  32.2  MCHC 32.9  --  32.5  --  33.1  RDW 12.8  --  13.1  --  13.2  PLT 185  --  146*  --  157   < > = values in this interval  not displayed.   Cardiac EnzymesNo results for input(s): TROPONINI in the last 168  hours. No results for input(s): TROPIPOC in the last 168 hours.  BNP Recent Labs  Lab 10/11/19 0021  BNP 1,074.1*    DDimer No results for input(s): DDIMER in the last 168 hours.  Radiology/Studies:  Dg Chest Port 1 View  Result Date: 10/12/2019 CLINICAL DATA:  Presence of endotracheal tube. EXAM: PORTABLE CHEST 1 VIEW COMPARISON:  10/11/2019 FINDINGS: Endotracheal tube is 2.3 cm above the carina. Nasogastric tube extends into the abdomen but the tip is beyond the image. Patchy hazy densities in the right lung have slightly decreased. Persistent obscuration of the left hemidiaphragm suggestive for consolidation or volume loss. Negative for pneumothorax. Heart size is within normal limits and stable. IMPRESSION: 1. Lung densities have minimally changed. Persistent airspace densities throughout the right lung. Persistent volume loss or consolidation at the left lung base. 2. Endotracheal tube is appropriately positioned. Electronically Signed   By: Markus Daft M.D.   On: 10/12/2019 08:36   Dg Chest Port 1 View  Addendum Date: 10/11/2019   ADDENDUM REPORT: 10/11/2019 16:37 ADDENDUM: These results will be called to the ordering clinician or representative by the Radiologist Assistant, and communication documented in the PACS or zVision Dashboard. Electronically Signed   By: Lovena Le M.D.   On: 10/11/2019 16:37   Result Date: 10/11/2019 CLINICAL DATA:  ET tube insertion EXAM: PORTABLE CHEST 1 VIEW COMPARISON:  Radiograph 10/11/2019 FINDINGS: Suboptimal imaging quality with grid artifact. Low positioning of the endotracheal tube with the tip positioned approximately 5 mm from the carina. Transesophageal tube tip and side port distal to the GE junction. Redemonstration of the right upper and mid lung airspace consolidation as well as more dense retrocardiac opacity as well. Findings are similar to most recent  comparison study. The aorta is calcified. The remaining cardiomediastinal contours are unremarkable. Degenerative changes are present in the imaged spine and shoulders. IMPRESSION: 1. Low positioning of the endotracheal tube with the tip positioned approximately 5 mm from the carina. Recommend retraction 3-4 cm to the mid trachea. 2. Satisfactory positioning of the transesophageal tube. 3. Bilateral airspace disease, essentially unchanged from most recent study. 4. Suboptimal imaging quality due to technical factors. Electronically Signed: By: Lovena Le M.D. On: 10/11/2019 16:35   Dg Chest Port 1 View  Result Date: 10/11/2019 CLINICAL DATA:  Intubation, OG tube placement EXAM: PORTABLE CHEST 1 VIEW COMPARISON:  10/11/2019 FINDINGS: Endotracheal to is 2 cm above the carina. NG tube enters the stomach. Airspace consolidation noted in the right upper lobe and left perihilar and lower lobe. Slightly improved since prior study. No visible effusions. No acute bony abnormality. IMPRESSION: Support devices as above. Bilateral airspace disease, slightly improved since prior study. Electronically Signed   By: Rolm Baptise M.D.   On: 10/11/2019 01:22   Dg Chest Portable 1 View  Result Date: 10/11/2019 CLINICAL DATA:  Shortness of breath. Respiratory distress. EXAM: PORTABLE CHEST 1 VIEW COMPARISON:  06/21/2019 FINDINGS: Unchanged heart size and mediastinal contours with aortic atherosclerosis. Diffuse heterogeneous mixed interstitial patchy airspace opacities throughout both lungs, right greater than left. Pulmonary vasculature is indistinct. Bilateral pleural effusions. Retrocardiac hiatal hernia. No pneumothorax. IMPRESSION: 1. Diffuse heterogeneous mixed interstitial and patchy airspace opacities, right greater than left. Findings may represent pulmonary edema, multifocal infection, or combination thereof. 2. Small bilateral pleural effusions. 3. Aortic atherosclerosis. Electronically Signed   By: Keith Rake M.D.   On: 10/11/2019 00:59     Assessment and Plan:   This note was prepared remotely in order  to reduce patient/staff exposure during Covid pandemic. MD recommendations to follow pending his review.  1. Acute hypoxic respiratory failure with HCAP, sepsis and shock requiring pressors - possibly mixed picture (sepsis/cardiogenic). Organism currently unclear. Initial SARS-CoV2 test negative. Respiratory panel is also negative and PCT is low. PCCM managing.  2. Acute systolic CHF with prior HOCM physiogy; also mild-moderate mitral regurgitation and mild pulmonary HTN - blood pressure prohibits aggressive med titration. Cannot exclude underlying CAD. However, given comorbidities and acute illness, patient is poor candidate for interventional procedures.  Will review clinical scenario with MD. Unfortunately, given her respiratory failure, advanced dementia, advanced age and severely decreased ejection fraction, prognosis is quite guarded.  3. Elevated troponin - suspect demand ischemia in setting of severe medical illness, but cannot r/o underlying coronary artery disease. She is currently on heparin per pharmacy. Will discuss duration of heparin + addition of ASA therapy with MD. Anticipate conservative management acutely. Can consider risk factor modification such as statin contingent on trajectory of recovery.  4. AKI on CKD stage III - Cr baseline 0.9-1.0, up to 1.13 on admission. Will order f/u daily BMET.  5. Hypothyroidism - maintained on IV levothyroxine as inpatient. Add thyroid function to AM labs.   For questions or updates, please contact [place] Please consult [email or web address] for contact info under    Signed, Charlie Pitter, PA-C  10/12/2019 4:09 PM   Agree with findings by Burna Mortimer  We are asked to see this elderly female intubated, and presumably in septic shock with dementia because of mildly elevated troponins and LV dysfunction on 2D echo.  I do not  think she is interventional candidate.  Given her age, dementia and other comorbidities I do not feel that an aggressive approach is appropriate.  I suspect a conversation with her family about goals of care would be worthwhile pursuing.  Otherwise, supportive care, use of pressors antibiotics etc. as you are doing.  I do not think further cardiovascular evaluation is is appropriate at this time.  Lorretta Harp, M.D., [place], [facility], [place] [address]. [place], [place]  [address]  [phone] 10/12/2019 6:03 PM

## 2019-10-12 NOTE — Progress Notes (Signed)
Hypoglycemic Event  CBG: 54  Treatment: D50 50 mL (25 gm)  Symptoms: None  Follow-up CBG: Time: 1952 CBG Result: 85  Possible Reasons for Event: Other: Feeding tube paused, waiting on new pump      Hannah Galvan

## 2019-10-13 DIAGNOSIS — E876 Hypokalemia: Secondary | ICD-10-CM

## 2019-10-13 DIAGNOSIS — I5021 Acute systolic (congestive) heart failure: Secondary | ICD-10-CM

## 2019-10-13 DIAGNOSIS — Z9911 Dependence on respirator [ventilator] status: Secondary | ICD-10-CM

## 2019-10-13 LAB — CBC
HCT: 33.4 % — ABNORMAL LOW (ref 36.0–46.0)
Hemoglobin: 10.8 g/dL — ABNORMAL LOW (ref 12.0–15.0)
MCH: 32.5 pg (ref 26.0–34.0)
MCHC: 32.3 g/dL (ref 30.0–36.0)
MCV: 100.6 fL — ABNORMAL HIGH (ref 80.0–100.0)
Platelets: 127 10*3/uL — ABNORMAL LOW (ref 150–400)
RBC: 3.32 MIL/uL — ABNORMAL LOW (ref 3.87–5.11)
RDW: 13.4 % (ref 11.5–15.5)
WBC: 10 10*3/uL (ref 4.0–10.5)
nRBC: 0.2 % (ref 0.0–0.2)

## 2019-10-13 LAB — PHOSPHORUS
Phosphorus: 2.1 mg/dL — ABNORMAL LOW (ref 2.5–4.6)
Phosphorus: 2.4 mg/dL — ABNORMAL LOW (ref 2.5–4.6)

## 2019-10-13 LAB — BASIC METABOLIC PANEL
Anion gap: 11 (ref 5–15)
BUN: 24 mg/dL — ABNORMAL HIGH (ref 8–23)
CO2: 21 mmol/L — ABNORMAL LOW (ref 22–32)
Calcium: 7.9 mg/dL — ABNORMAL LOW (ref 8.9–10.3)
Chloride: 109 mmol/L (ref 98–111)
Creatinine, Ser: 0.73 mg/dL (ref 0.44–1.00)
GFR calc Af Amer: >60 mL/min (ref >60)
GFR calc non Af Amer: >60 mL/min (ref >60)
Glucose, Bld: 89 mg/dL (ref 70–99)
Potassium: 3.1 mmol/L — ABNORMAL LOW (ref 3.5–5.1)
Sodium: 141 mmol/L (ref 135–145)

## 2019-10-13 LAB — GLUCOSE, CAPILLARY
Glucose-Capillary: 95 mg/dL (ref 70–99)
Glucose-Capillary: 86 mg/dL (ref 70–99)
Glucose-Capillary: 113 mg/dL — ABNORMAL HIGH (ref 70–99)
Glucose-Capillary: 75 mg/dL (ref 70–99)
Glucose-Capillary: 112 mg/dL — ABNORMAL HIGH (ref 70–99)
Glucose-Capillary: 113 mg/dL — ABNORMAL HIGH (ref 70–99)

## 2019-10-13 LAB — MAGNESIUM
Magnesium: 2 mg/dL (ref 1.7–2.4)
Magnesium: 2.1 mg/dL (ref 1.7–2.4)

## 2019-10-13 LAB — TSH: TSH: 3.758 u[IU]/mL (ref 0.350–4.500)

## 2019-10-13 LAB — HEPARIN LEVEL (UNFRACTIONATED)
Heparin Unfractionated: 0.2 IU/mL — ABNORMAL LOW (ref 0.30–0.70)
Heparin Unfractionated: 0.31 IU/mL (ref 0.30–0.70)

## 2019-10-13 LAB — T4, FREE: Free T4: 0.78 ng/dL (ref 0.61–1.12)

## 2019-10-13 MED ORDER — VITAL AF 1.2 CAL PO LIQD
1000.0000 mL | ORAL | Status: DC
  Administered 2019-10-13: 09:00:00 1000 mL

## 2019-10-13 MED ORDER — POTASSIUM CHLORIDE 20 MEQ/15ML (10%) PO SOLN
30.0000 meq | ORAL | Status: AC
  Administered 2019-10-13 (×2): 30 meq
  Filled 2019-10-13 (×2): qty 30

## 2019-10-13 MED ORDER — VITAL HIGH PROTEIN PO LIQD: 1000.0000 mL | ORAL | Status: DC

## 2019-10-13 MED ORDER — FUROSEMIDE 10 MG/ML IJ SOLN
40.0000 mg | Freq: Once | INTRAMUSCULAR | Status: AC
  Administered 2019-10-13: 09:00:00 40 mg via INTRAVENOUS
  Filled 2019-10-13: qty 4

## 2019-10-13 MED ORDER — PRO-STAT SUGAR FREE PO LIQD
30.0000 mL | Freq: Two times a day (BID) | ORAL | Status: DC
  Filled 2019-10-13: qty 30

## 2019-10-13 MED ORDER — VITAL AF 1.2 CAL PO LIQD: 1000.0000 mL | ORAL | Status: DC

## 2019-10-13 MED ORDER — PRO-STAT SUGAR FREE PO LIQD
30.0000 mL | Freq: Three times a day (TID) | ORAL | Status: DC
  Administered 2019-10-13 – 2019-10-14 (×4): 30 mL
  Filled 2019-10-13 (×3): qty 30

## 2019-10-13 NOTE — Progress Notes (Signed)
[facility] for Heparin Indication: chest pain/ACS  Allergies  Allergen Reactions  . Ambien [Zolpidem]     confusion  . Pyridium [Phenazopyridine Hcl] Other (See Comments)    hemolysis  . Sulfa Antibiotics Rash    Patient Measurements: Height: 5' 2.5" (158.8 cm) Weight: 155 lb 13.8 oz (70.7 kg) IBW/kg (Calculated) : 51.25 Heparin Dosing Weight: 64kg  Vital Signs: Temp: 97.8 F (36.6 C) (11/19 1950) Temp Source: Axillary (11/19 1950) BP: 119/50 (11/19 2245) Pulse Rate: 68 (11/19 2245)  Labs: Recent Labs    10/11/19 0021  10/11/19 0049 10/11/19 0246  10/11/19 0429 10/11/19 0525  10/12/19 0231 10/12/19 1122 10/12/19 1301 10/12/19 2258 10/13/19 0602 10/13/19 2208  HGB 16.3*   < >  --   --    < > 13.6 12.6  --  12.1  --   --   --  10.8*  --   HCT 49.6*   < >  --   --    < > 41.8 37.0  --  36.6  --   --   --  33.4*  --   PLT 185  --   --   --   --  146*  --   --  157  --   --   --  127*  --   APTT  --   --   --   --   --  26  --   --   --   --   --   --   --   --   LABPROT  --   --   --   --   --  16.4*  --   --   --   --   --   --   --   --   INR  --   --   --   --   --  1.3*  --   --   --   --   --   --   --   --   HEPARINUNFRC  --   --   --   --   --   --   --    < > 0.49  --   --  0.25* 0.20* 0.31  CREATININE 1.13*  --  1.01*  --   --   --   --   --   --   --   --   --  0.73  --   TROPONINIHS 960*  --   --  1,191*  --   --   --   --   --  511* 474*  --   --   --    < > = values in this interval not displayed.    Estimated Creatinine Clearance: 47.1 mL/min (by C-G formula based on SCr of 0.73 mg/dL).   Medical History: Past Medical History:  Diagnosis Date  . Allergy   . Anxiety   . Arthritis   . Blood transfusion without reported diagnosis   . Breast cancer ([place])    breast, s/p RXT, bilateral  . Cataract   . CKD (chronic kidney disease), stage III   . Delirium   . Dementia ([place])   . History of echocardiogram    a.  Echo 8/16: Vigorous LVF, EF 41-93%, grade 1 diastolic dysfunction, trivial AI, mild MR, mild RVE, normal RV function, PASP 33 mmHg // b. Echo 1/17 EF 65-70%, normal wall  motion, grade 1 diastolic dysfunction, trivial AI, MAC, mild MR // c. Echo 11/26/16: mild LVH, EF 60-65, mild AI, mild MR, mild LAE, mild to mod TR  . History of stress test    Lexiscan Myoview 8/16:  EF 81%, breast attenuation, no ischemia; Low Risk // Myoview 11/26/16: EF 75, apical defect c/w soft tissue attenuation, no ischemia or scar; Low Risk  . HOCM (hypertrophic obstructive cardiomyopathy) ([place])   . Hyperlipidemia   . Hypertension   . Hypothyroidism   . Personal history of radiation therapy   . Premature atrial contractions    Assessment: 30 YOF presenting from SNF in acute respiratory distress 2/2 pna, with increasing troponin 960>1191 and concern for ACS.  Not on anticoagulation PTA. Pt with AKI, Scr improving.  11/19 PM update:  Heparin level therapeutic x 1 after rate increase  Goal of Therapy:  Heparin level 0.3-0.7 units/ml Monitor platelets by anticoagulation protocol: Yes   Plan:  Cont heparin at 850 units/hr Confirmatory heparin level with AM labs  Narda Bonds, PharmD, Lear[employer] Pharmacist Phone: [phone]

## 2019-10-13 NOTE — Progress Notes (Signed)
[facility] for Heparin Indication: chest pain/ACS  Allergies  Allergen Reactions  . Ambien [Zolpidem]     confusion  . Pyridium [Phenazopyridine Hcl] Other (See Comments)    hemolysis  . Sulfa Antibiotics Rash    Patient Measurements: Height: 5' 2.5" (158.8 cm) Weight: 155 lb 13.8 oz (70.7 kg) IBW/kg (Calculated) : 51.25 Heparin Dosing Weight: 64kg  Vital Signs: Temp: 98.5 F (36.9 C) (11/19 0725) Temp Source: Oral (11/19 0725) BP: 91/42 (11/19 0700) Pulse Rate: 74 (11/19 0728)  Labs: Recent Labs    10/11/19 0021  10/11/19 0049 10/11/19 0246  10/11/19 0429 10/11/19 0525  10/12/19 0231 10/12/19 1122 10/12/19 1301 10/12/19 2258 10/13/19 0602  HGB 16.3*   < >  --   --    < > 13.6 12.6  --  12.1  --   --   --  10.8*  HCT 49.6*   < >  --   --    < > 41.8 37.0  --  36.6  --   --   --  33.4*  PLT 185  --   --   --   --  146*  --   --  157  --   --   --  127*  APTT  --   --   --   --   --  26  --   --   --   --   --   --   --   LABPROT  --   --   --   --   --  16.4*  --   --   --   --   --   --   --   INR  --   --   --   --   --  1.3*  --   --   --   --   --   --   --   HEPARINUNFRC  --   --   --   --   --   --   --    < > 0.49  --   --  0.25* 0.20*  CREATININE 1.13*  --  1.01*  --   --   --   --   --   --   --   --   --  0.73  TROPONINIHS 960*  --   --  1,191*  --   --   --   --   --  511* 474*  --   --    < > = values in this interval not displayed.    Estimated Creatinine Clearance: 47.1 mL/min (by C-G formula based on SCr of 0.73 mg/dL).   Medical History: Past Medical History:  Diagnosis Date  . Allergy   . Anxiety   . Arthritis   . Blood transfusion without reported diagnosis   . Breast cancer ([place])    breast, s/p RXT, bilateral  . Cataract   . CKD (chronic kidney disease), stage III   . Delirium   . Dementia ([place])   . History of echocardiogram    a. Echo 8/16: Vigorous LVF, EF 16-10%, grade 1 diastolic  dysfunction, trivial AI, mild MR, mild RVE, normal RV function, PASP 33 mmHg // b. Echo 1/17 EF 65-70%, normal wall motion, grade 1 diastolic dysfunction, trivial AI, MAC, mild MR // c. Echo 11/26/16: mild LVH, EF 60-65, mild AI, mild MR, mild LAE, mild to mod  TR  . History of stress test    Lexiscan Myoview 8/16:  EF 81%, breast attenuation, no ischemia; Low Risk // Myoview 11/26/16: EF 75, apical defect c/w soft tissue attenuation, no ischemia or scar; Low Risk  . HOCM (hypertrophic obstructive cardiomyopathy) ([place])   . Hyperlipidemia   . Hypertension   . Hypothyroidism   . Personal history of radiation therapy   . Premature atrial contractions    Assessment: 49 YOF presenting from SNF in acute respiratory distress 2/2 pna, with increasing troponin 960>1191 and concern for ACS.  Not on anticoagulation PTA. Pt with AKI, Scr improving.  HL currently subtherapeutic at 0.20. Hg, PLT wnl. No documented signs or bleeding.  Goal of Therapy:  Heparin level 0.3-0.7 units/ml Monitor platelets by anticoagulation protocol: Yes   Plan:  Increase heparin to 850 units/hr -Recheck heparin level in 8 hours and obtain CBC with AM labs. Troponins trending downward. Follow up continued need for anticoagulation. Cardiology following.  Onnie Boer; PharmD Candidate

## 2019-10-13 NOTE — Progress Notes (Addendum)
Nutrition Follow-up / Consult  DOCUMENTATION CODES:   Not applicable  INTERVENTION:   Continue TF via OG tube:   Vital AF 1.2 decrease to 30 ml/h (720 ml per day)   Pro-stat 30 ml TID   Provides 1164 kcal (1523 kcal total with Propofol), 99 gm protein, 584 ml free water daily  NUTRITION DIAGNOSIS:   Inadequate oral intake related to inability to eat as evidenced by NPO status.  Ongoing   GOAL:   Patient will meet greater than or equal to 90% of their needs   Met with TF  MONITOR:   Vent status, Labs, TF tolerance, Skin  REASON FOR ASSESSMENT:   Consult Enteral/tube feeding initiation and management  ASSESSMENT:   85 yo female admitted with acute respiratory distress requiring intubation. COVID-19 negative. PMH includes dementia, hypothyroidism, HTN, HLD, breast cancer.   Received MD Consult for TF initiation and management. TF was held last night because new TF pump was needed. TF has been resumed: Vital AF 1.2 at 50 ml/h via OG tube, providing 1440 kcal, 90 gm protein, 973 ml free water daily. Total intake 1799 kcal with Propofol.   Patient remains intubated on ventilator support MV: 13.8 L/min Temp (24hrs), Avg:98.2 F (36.8 C), Min:97.7 F (36.5 C), Max:98.7 F (37.1 C)  Propofol at 13.6 ml/h providing 359 kcal from lipid Labs reviewed. Potassium 3.1 (L) CBG's: 79-95-86  Medications reviewed and include lasix, KCl, propofol.   I/O + 5.9 L since admission Weight up by 5.4 kg  NUTRITION - FOCUSED PHYSICAL EXAM:  unable to complete  Diet Order:   Diet Order            Diet NPO time specified  Diet effective now              EDUCATION NEEDS:   Not appropriate for education at this time  Skin:  Skin Assessment: Reviewed RN Assessment  Last BM:  11/18 type 7  Height:   Ht Readings from Last 1 Encounters:  10/11/19 5' 2.5" (1.588 m)    Weight:   Wt Readings from Last 1 Encounters:  10/13/19 70.7 kg   Admission weight 65.3  kg  Ideal Body Weight:  51.1 kg  BMI:  Body mass index is 28.05 kg/m.  Estimated Nutritional Needs:   Kcal:  1425  Protein:  90-100 gm  Fluid:  >/= 1.5 L    Molli Barrows, RD, LDN, [place] Pager [phone] After Hours Pager [phone]

## 2019-10-13 NOTE — Progress Notes (Signed)
Pharmacist Heart Failure Core Measure Documentation  Assessment: Hannah Galvan has an EF documented as 25-30% on 11/17.  Rationale: Heart failure patients with left ventricular systolic dysfunction (LVSD) and an EF < 40% should be prescribed an angiotensin converting enzyme inhibitor (ACEI) or angiotensin receptor blocker (ARB) at discharge unless a contraindication is documented in the medical record.  This patient is not currently on an ACEI or ARB for HF.  This note is being placed in the record in order to provide documentation that a contraindication to the use of these agents is present for this encounter.  ACE Inhibitor or Angiotensin Receptor Blocker is contraindicated (specify all that apply)  []   ACEI allergy AND ARB allergy []   Angioedema []   Moderate or severe aortic stenosis []   Hyperkalemia [x]   Hypotension []   Renal artery stenosis []   Worsening renal function, preexisting renal disease or dysfunction   Harvel Quale 10/13/2019 11:41 AM

## 2019-10-13 NOTE — Progress Notes (Signed)
Attempted SBT, pt failed d/t RR 40's.  Increased PSV up to 18 cmH2O, pt still w/ tachypnea RR 40's.  Pt placed back on full vent support.

## 2019-10-13 NOTE — Progress Notes (Signed)
NAME:  Hannah Galvan, MRN:  [id number], DOB:  03-10-1931, LOS: 2 ADMISSION DATE:  10/11/2019, CONSULTATION DATE:  11/17 REFERRING MD:  Dr. Dina Rich, CHIEF COMPLAINT:  Acute Respiratory failure   Brief History   85 year old female who presented from outside skilled nursing facility with acute respiratory distress, COVID-19 negative on admission.  Patient suffered worsening respiratory distress in emergency department and was subsequently intubated.  PCCM consulted for admission  History of present illness   85 year old female with extensive past medical history including but not limited to hypertrophic obstructive cardiomyopathy and advanced dementia who to the emergency department via EMS from local skilled nursing facility in acute respiratory distress.  According to chart review patient was found on a nightly rounds with increased respiratory distress and work of breathing with an oxygen saturation of 60% on room air. She was placed on CPAP by EMS with improvement in saturations patient reports she did report that she was mildly short of breath prior to going to bed tonight.    Current lab work impressive for lactic acidosis, leukocytosis, elevated BNP, and hypokalemia.  Chest x-ray with signs of early opacities concerning for pulmonary edema versus multifocal infection.  While in the emergency department due to concern for COVID-19 patient was removed from NIV therapy with eventual return of increased work of breathing and signs of respiratory fatigue prompting need for endotracheal intubation.  PCCM consulted for admission  Past Medical History   has a past medical history of Allergy, Anxiety, Arthritis, Blood transfusion without reported diagnosis, Breast cancer ([place]), Cataract, CKD (chronic kidney disease), stage III, Delirium, Dementia ([place]), History of echocardiogram, History of stress test, HOCM (hypertrophic obstructive cardiomyopathy) ([place]), Hyperlipidemia, Hypertension, Hypothyroidism,  Personal history of radiation therapy, and Premature atrial contractions.   Significant Hospital Events   11/17 admitted 11/18 TTE 11/17 - Depressed EF from 55% to 25%. Not a candidate for cardiac intervention 11/19 Failed SBT due to tachypnea  Consults:  Cardiology  Procedures:  11/17 - ETT  Significant Diagnostic Tests:  CXR - diffuse heterogeneous mixed interstitial and patchy airspace opacities, right greater than the left.  Findings represent possible pulmonary edema, multifocal infection, or combination.  Small bilateral pleural effusions.  TTE 10/11/19 showed EF 25-30%, severely decreased LV function, G1DD, akinesis of atneroseptal wall and apex.,  normal RV function.  Compared to echocardiogram 11/2017, worsening EF  Micro Data:  COVID-19 11/17 >> negative Blood culture 11/17 >> Urine culture 1117 >> Respiratory virus panel 11/17 >> Sputum culture 11/17 >> MRSA PCR 11/17 >>  Antimicrobials:  Cefepime 11/17 >> Vancomycin 11/17  Doxycycline 11/17 >>  Interim history/subjective:  Weaned off vasopressor support. Sedated on mechanical ventilation. Failed SBT due to tachypnea. CFB _5.8L since admission  Objective   Blood pressure (!) 91/42, pulse 61, temperature 98.5 F (36.9 C), temperature source Oral, resp. rate 16, height 5' 2.5" (1.588 m), weight 70.7 kg, SpO2 100 %.    Vent Mode: PRVC FiO2 (%):  [40 %] 40 % Set Rate:  [16 bmp] 16 bmp Vt Set:  [410 mL] 410 mL PEEP:  [5 cmH20] 5 cmH20 Plateau Pressure:  [17 cmH20-24 cmH20] 24 cmH20   Intake/Output Summary (Last 24 hours) at 10/13/2019 0728 Last data filed at 10/13/2019 0600 Gross per 24 hour  Intake 2629.46 ml  Output 1750 ml  Net 879.46 ml   Filed Weights   10/11/19 0049 10/12/19 0500 10/13/19 0352  Weight: 65.3 kg 71.6 kg 70.7 kg   Physical Exam: General: Frail, elderly female,  appears uncomfortable, attempting to talk around the tube HENT: [place], AT, ETT in place Eyes: EOMI, no scleral icterus  Respiratory: Rhonchi and crackles bilaterally Cardiovascular: RRR, -M/R/G, no JVD GI: BS+, soft, nontender Extremities:1+ pitting edema on lower extremities,-tenderness Neuro: AAO x4, CNII-XII grossly intact Skin: Intact, no rashes or bruising GU: Foley in place  Resolved Hospital Problem list   AKI  Assessment & Plan:  ASSESSMENT / PLAN:  Acute hypoxic respiratory failure: Likely multifactorial with pulmonary edema and community-acquired pneumonia. CXR 11/18 personally reviewed and demonstrated some improvement with right airspace disease with persistent bilateral interstitial opacities Echo read on 11/17 showed EF 25-30% with severe hypokenesis.  P:   - Daily WUA and SBT - Full vent support with weaning protocol - Continue Cefepime and doxycycline x 5 days - De-escalate pending culture data - Diurese today  Sedation on mechanical ventilation Hx advanced dementia at baseline. On home zoloft, ativan, aricept P:   - Wean Propofol - RASS goal 0 and -1 per   Septic shock secondary to community-acquired pneumonia P:  - Wean Neo gtt for MAP >65  - hold home antihypertensive medications.   Hypertrophic obstructive cardiomyopathy Demand ischemic related to worsening heart failure - peak trop 1191 Echo on 10/11/19 showed EF 25-30%, severely decreased LV function, G1DD, akinesis of atneroseptal wall and apex.,  normal RV function.  Compared to echocardiogram 11/2017, worsening EF P: - Appreciate Cardiology input. Patient poor candidate for interventional procedure to exclude underlying CAD. Continue medical management with ASA and heparin per consult team.  Hypokalemia P: - Daily bmp - Replete  Hypothyroidism P:   - Restart home synthroid. Reduce dose if IV synthroid is necessary.   Best practice:  Diet: Start tube feeds Pain/Anxiety/Delirium protocol (if indicated): as needed fentanyl and versed VAP protocol (if indicated):  In place DVT prophylaxis: systemic heparin GI  prophylaxis: protonix Glucose control: ssi Mobility: bedrest Code Status: limited code blue intubation only  Family Communication: son updated by ED physician. Continue to update.  Disposition: ICU  Labs   CBC: Recent Labs  Lab 10/11/19 0021  10/11/19 0247 10/11/19 0429 10/11/19 0525 10/12/19 0231 10/13/19 0602  WBC 18.6*  --   --  15.3*  --  15.9* 10.0  NEUTROABS 16.0*  --   --   --   --   --   --   HGB 16.3*   < > 12.2 13.6 12.6 12.1 10.8*  HCT 49.6*   < > 36.0 41.8 37.0 36.6 33.4*  MCV 96.7  --   --  98.6  --  97.3 100.6*  PLT 185  --   --  146*  --  157 127*   < > = values in this interval not displayed.    Basic Metabolic Panel: Recent Labs  Lab 10/11/19 0021 10/11/19 0043 10/11/19 0049 10/11/19 0247 10/11/19 0429 10/11/19 0525 10/13/19 0602  NA 139 138 140 141  --  140 141  K 3.1* 2.7* 3.6 3.4*  --  4.5 3.1*  CL 104  --  110  --   --   --  109  CO2 21*  --  20*  --   --   --  21*  GLUCOSE 196*  --  160*  --   --   --  89  BUN 21  --  21  --   --   --  24*  CREATININE 1.13*  --  1.01*  --   --   --  0.73  CALCIUM 9.1  --  7.6*  --   --   --  7.9*  MG  --   --   --   --  2.3  --   --   PHOS  --   --   --   --  3.2  --   --    GFR: Estimated Creatinine Clearance: 47.1 mL/min (by C-G formula based on SCr of 0.73 mg/dL). Recent Labs  Lab 10/11/19 0021 10/11/19 0246 10/11/19 0429 10/12/19 0231 10/13/19 0602  PROCALCITON  --  0.15  --   --   --   WBC 18.6*  --  15.3* 15.9* 10.0  LATICACIDVEN 3.0* 2.4*  --   --   --     Liver Function Tests: Recent Labs  Lab 10/11/19 0049  AST 46*  ALT 22  ALKPHOS 38  BILITOT 1.1  PROT 5.3*  ALBUMIN 3.1*   No results for input(s): LIPASE, AMYLASE in the last 168 hours. No results for input(s): AMMONIA in the last 168 hours.  ABG    Component Value Date/Time   PHART 7.298 (L) 10/11/2019 0525   PCO2ART 39.6 10/11/2019 0525   PO2ART 121.0 (H) 10/11/2019 0525   HCO3 19.1 (L) 10/11/2019 0525   TCO2 20 (L)  10/11/2019 0525   ACIDBASEDEF 6.0 (H) 10/11/2019 0525   O2SAT 98.0 10/11/2019 0525     Coagulation Profile: Recent Labs  Lab 10/11/19 0429  INR 1.3*    Cardiac Enzymes: No results for input(s): CKTOTAL, CKMB, CKMBINDEX, TROPONINI in the last 168 hours.  HbA1C: Hgb A1c MFr Bld  Date/Time Value Ref Range Status  12/20/2018 04:56 PM 5.5 <5.7 % of total Hgb Final    Comment:    For the purpose of screening for the presence of diabetes: . <5.7%       Consistent with the absence of diabetes 5.7-6.4%    Consistent with increased risk for diabetes             (prediabetes) > or =6.5%  Consistent with diabetes . This assay result is consistent with a decreased risk of diabetes. . Currently, no consensus exists regarding use of hemoglobin A1c for diagnosis of diabetes in children. . According to American Diabetes Association (ADA) guidelines, hemoglobin A1c <7.0% represents optimal control in non-pregnant diabetic patients. Different metrics may apply to specific patient populations.  Standards of Medical Care in Diabetes(ADA). Marland Kitchen   06/02/2012 03:11 PM 5.4 <5.7 % Final    Comment:                                                                           According to the ADA Clinical Practice Recommendations for 2011, when HbA1c is used as a screening test:     >=6.5%   Diagnostic of Diabetes Mellitus            (if abnormal result is confirmed)   5.7-6.4%   Increased risk of developing Diabetes Mellitus   References:Diagnosis and Classification of Diabetes Mellitus,Diabetes [id number] 1):S62-S69 and Standards of Medical Care in         Diabetes - 2011,Diabetes [id number] (Suppl 1):S11-S61.      CBG: Recent Labs  Lab 10/12/19 1921  10/12/19 1951 10/12/19 2303 10/13/19 0301 10/13/19 0723  GLUCAP 54* 85 79 95 86   Critical care time: 35 min    The patient is critically ill with multiple organ systems failure and requires high complexity decision  making for assessment and support, frequent evaluation and titration of therapies, application of advanced monitoring technologies and extensive interpretation of multiple databases.   Rodman Pickle, M.D. [facility] Pulmonary/Critical Care Medicine 10/13/2019 7:29 AM  Pager: [phone] After hours pager: [phone]

## 2019-10-14 LAB — CBC
HCT: 33.7 % — ABNORMAL LOW (ref 36.0–46.0)
Hemoglobin: 11.2 g/dL — ABNORMAL LOW (ref 12.0–15.0)
MCH: 31.8 pg (ref 26.0–34.0)
MCHC: 33.2 g/dL (ref 30.0–36.0)
MCV: 95.7 fL (ref 80.0–100.0)
Platelets: 161 10*3/uL (ref 150–400)
RBC: 3.52 MIL/uL — ABNORMAL LOW (ref 3.87–5.11)
RDW: 13.4 % (ref 11.5–15.5)
WBC: 7.8 10*3/uL (ref 4.0–10.5)
nRBC: 0 % (ref 0.0–0.2)

## 2019-10-14 LAB — BASIC METABOLIC PANEL
Anion gap: 10 (ref 5–15)
Anion gap: 12 (ref 5–15)
BUN: 28 mg/dL — ABNORMAL HIGH (ref 8–23)
BUN: 29 mg/dL — ABNORMAL HIGH (ref 8–23)
CO2: 22 mmol/L (ref 22–32)
CO2: 20 mmol/L — ABNORMAL LOW (ref 22–32)
Calcium: 8.3 mg/dL — ABNORMAL LOW (ref 8.9–10.3)
Calcium: 8.6 mg/dL — ABNORMAL LOW (ref 8.9–10.3)
Chloride: 108 mmol/L (ref 98–111)
Chloride: 108 mmol/L (ref 98–111)
Creatinine, Ser: 0.61 mg/dL (ref 0.44–1.00)
Creatinine, Ser: 0.81 mg/dL (ref 0.44–1.00)
GFR calc Af Amer: >60 mL/min (ref >60)
GFR calc Af Amer: >60 mL/min (ref >60)
GFR calc non Af Amer: >60 mL/min (ref >60)
GFR calc non Af Amer: >60 mL/min (ref >60)
Glucose, Bld: 114 mg/dL — ABNORMAL HIGH (ref 70–99)
Glucose, Bld: 91 mg/dL (ref 70–99)
Potassium: 3.1 mmol/L — ABNORMAL LOW (ref 3.5–5.1)
Potassium: 4.1 mmol/L (ref 3.5–5.1)
Sodium: 140 mmol/L (ref 135–145)
Sodium: 140 mmol/L (ref 135–145)

## 2019-10-14 LAB — GLUCOSE, CAPILLARY
Glucose-Capillary: 115 mg/dL — ABNORMAL HIGH (ref 70–99)
Glucose-Capillary: 97 mg/dL (ref 70–99)
Glucose-Capillary: 155 mg/dL — ABNORMAL HIGH (ref 70–99)
Glucose-Capillary: 116 mg/dL — ABNORMAL HIGH (ref 70–99)
Glucose-Capillary: 109 mg/dL — ABNORMAL HIGH (ref 70–99)
Glucose-Capillary: 97 mg/dL (ref 70–99)

## 2019-10-14 LAB — MAGNESIUM
Magnesium: 2 mg/dL (ref 1.7–2.4)
Magnesium: 2 mg/dL (ref 1.7–2.4)

## 2019-10-14 LAB — PHOSPHORUS
Phosphorus: 2.7 mg/dL (ref 2.5–4.6)
Phosphorus: 2.8 mg/dL (ref 2.5–4.6)

## 2019-10-14 LAB — HEPARIN LEVEL (UNFRACTIONATED): Heparin Unfractionated: 0.38 IU/mL (ref 0.30–0.70)

## 2019-10-14 MED ORDER — POTASSIUM CHLORIDE 20 MEQ/15ML (10%) PO SOLN
30.0000 meq | ORAL | Status: AC
  Administered 2019-10-14 (×2): 30 meq
  Filled 2019-10-14 (×2): qty 30

## 2019-10-14 MED ORDER — CHLORHEXIDINE GLUCONATE 0.12 % MT SOLN
15.0000 mL | Freq: Two times a day (BID) | OROMUCOSAL | Status: DC
  Administered 2019-10-15 – 2019-10-25 (×20): 15 mL via OROMUCOSAL
  Filled 2019-10-14 (×13): qty 15

## 2019-10-14 MED ORDER — OXYCODONE HCL 5 MG PO TABS
5.0000 mg | ORAL_TABLET | Freq: Four times a day (QID) | ORAL | Status: DC | PRN
  Administered 2019-10-14 – 2019-10-22 (×7): 5 mg via ORAL
  Filled 2019-10-14 (×7): qty 1

## 2019-10-14 MED ORDER — ACETAMINOPHEN 325 MG PO TABS
650.0000 mg | ORAL_TABLET | Freq: Four times a day (QID) | ORAL | Status: DC | PRN
  Administered 2019-10-14 – 2019-10-24 (×8): 650 mg via ORAL
  Filled 2019-10-14 (×8): qty 2

## 2019-10-14 MED ORDER — VITAL AF 1.2 CAL PO LIQD
1000.0000 mL | ORAL | Status: DC
  Administered 2019-10-14: 1000 mL

## 2019-10-14 MED ORDER — ARIPIPRAZOLE 2 MG PO TABS
2.0000 mg | ORAL_TABLET | Freq: Every day | ORAL | Status: DC
  Administered 2019-10-14 – 2019-10-20 (×7): 2 mg
  Filled 2019-10-14 (×9): qty 1

## 2019-10-14 MED ORDER — FUROSEMIDE 10 MG/ML IJ SOLN
40.0000 mg | Freq: Once | INTRAMUSCULAR | Status: AC
  Administered 2019-10-14: 08:00:00 40 mg via INTRAVENOUS
  Filled 2019-10-14: qty 4

## 2019-10-14 MED ORDER — ORAL CARE MOUTH RINSE
15.0000 mL | Freq: Two times a day (BID) | OROMUCOSAL | Status: DC
  Administered 2019-10-14 – 2019-10-25 (×17): 15 mL via OROMUCOSAL

## 2019-10-14 MED ORDER — SERTRALINE HCL 50 MG PO TABS
100.0000 mg | ORAL_TABLET | Freq: Every day | ORAL | Status: DC
  Administered 2019-10-14 – 2019-10-24 (×11): 100 mg
  Filled 2019-10-14 (×7): qty 1
  Filled 2019-10-14: qty 2
  Filled 2019-10-14 (×4): qty 1

## 2019-10-14 MED ORDER — DEXMEDETOMIDINE HCL IN NACL 400 MCG/100ML IV SOLN
0.4000 ug/kg/h | INTRAVENOUS | Status: DC
  Administered 2019-10-14: 07:00:00 0.4 ug/kg/h via INTRAVENOUS
  Filled 2019-10-14: qty 100

## 2019-10-14 NOTE — Progress Notes (Addendum)
NAME:  Hannah Galvan, MRN:  [id number], DOB:  12-26-1930, LOS: 3 ADMISSION DATE:  10/11/2019, CONSULTATION DATE:  11/17 REFERRING MD:  Dr. Dina Rich, CHIEF COMPLAINT:  Acute Respiratory failure   Brief History   85 year old female who presented from outside skilled nursing facility with acute respiratory distress, COVID-19 negative on admission.  Patient suffered worsening respiratory distress in emergency department and was subsequently intubated.  PCCM consulted for admission  History of present illness   85 year old female with extensive past medical history including but not limited to hypertrophic obstructive cardiomyopathy and advanced dementia who to the emergency department via EMS from local skilled nursing facility in acute respiratory distress.  According to chart review patient was found on a nightly rounds with increased respiratory distress and work of breathing with an oxygen saturation of 60% on room air. She was placed on CPAP by EMS with improvement in saturations patient reports she did report that she was mildly short of breath prior to going to bed tonight.    Current lab work impressive for lactic acidosis, leukocytosis, elevated BNP, and hypokalemia.  Chest x-ray with signs of early opacities concerning for pulmonary edema versus multifocal infection.  While in the emergency department due to concern for COVID-19 patient was removed from NIV therapy with eventual return of increased work of breathing and signs of respiratory fatigue prompting need for endotracheal intubation.  PCCM consulted for admission  Past Medical History   has a past medical history of Allergy, Anxiety, Arthritis, Blood transfusion without reported diagnosis, Breast cancer ([place]), Cataract, CKD (chronic kidney disease), stage III, Delirium, Dementia ([place]), History of echocardiogram, History of stress test, HOCM (hypertrophic obstructive cardiomyopathy) ([place]), Hyperlipidemia, Hypertension, Hypothyroidism,  Personal history of radiation therapy, and Premature atrial contractions.   Significant Hospital Events   11/17 admitted 11/18 TTE 11/17 - Depressed EF from 55% to 25%. Not a candidate for cardiac intervention 11/19 Off pressors. Failed SBT due to tachypnea.  11/20 Diureses  Consults:  Cardiology  Procedures:  11/17 - ETT  Significant Diagnostic Tests:  CXR - diffuse heterogeneous mixed interstitial and patchy airspace opacities, right greater than the left.  Findings represent possible pulmonary edema, multifocal infection, or combination.  Small bilateral pleural effusions.  TTE 10/11/19 showed EF 25-30%, severely decreased LV function, G1DD, akinesis of atneroseptal wall and apex.,  normal RV function.  Compared to echocardiogram 11/2017, worsening EF  Micro Data:  COVID-19 11/17 >> negative Blood culture 11/17 >> Urine culture 1117 >> Respiratory virus panel 11/17 >> Sputum culture 11/17 >> MRSA PCR 11/17 >>  Antimicrobials:  Cefepime 11/17 >>11/20 Vancomycin 11/17 >>11/20 Doxycycline 11/17 >>11/20  Interim history/subjective:  Opens eyes to voice. Does not follow commands but still on sedation. Diuresed well yesterday.  Objective   Blood pressure 121/60, pulse 64, temperature 98.8 F (37.1 C), temperature source Axillary, resp. rate 16, height 5' 2.5" (1.588 m), weight 71.2 kg, SpO2 100 %.    Vent Mode: PRVC FiO2 (%):  [40 %] 40 % Set Rate:  [16 bmp] 16 bmp Vt Set:  [410 mL] 410 mL PEEP:  [5 cmH20] 5 cmH20 Plateau Pressure:  [6 cmH20-17 cmH20] 6 cmH20   Intake/Output Summary (Last 24 hours) at 10/14/2019 0738 Last data filed at 10/14/2019 0600 Gross per 24 hour  Intake 1390.66 ml  Output 3450 ml  Net -2059.34 ml   Filed Weights   10/12/19 0500 10/13/19 0352 10/14/19 0344  Weight: 71.6 kg 70.7 kg 71.2 kg   Physical Exam: General: Frail,  elderly female, sedated HENT: [place], AT, ETT in place Eyes: EOMI, no scleral icterus Respiratory: Clear to auscultation  bilaterally.  No crackles, wheezing or rales Cardiovascular: RRR, -M/R/G, no JVD GI: BS+, soft, nontender Extremities:-Edema,-tenderness Neuro: Opens eyes to voice, does not follow commands, moves extremities x 4 GU: [place] in place  Resolved Hospital Problem list   AKI Septic shock  Assessment & Plan:  ASSESSMENT / PLAN:  Acute hypoxic respiratory failure: Likely multifactorial with pulmonary edema and community-acquired pneumonia. CXR 11/18 personally reviewed and demonstrated some improvement with right airspace disease with persistent bilateral interstitial opacities Echo read on 11/17 showed EF 25-30% with severe hypokinesis.  P:   - Daily WUA and SBT - Full vent support with weaning protocols - Continue Cefepime and doxycycline x 5 days. End date 11/20 - De-escalate pending culture data - Repeat lasix 40 mg today  Sedation on mechanical ventilation Hx advanced dementia at baseline. On home zoloft, ativan, aricept P:   - Transition from Propofol to Precedex to facilitate vent weaning - RASS goal 0 and -1 per   Hypertrophic obstructive cardiomyopathy [place] ischemic related to worsening heart failure - peak trop 1191 Echo on 10/11/19 showed EF 25-30%, severely decreased LV function, G1DD, akinesis of atneroseptal wall and apex.,  normal RV function.  Compared to echocardiogram 11/2017, worsening EF P: - Appreciate Cardiology input. Patient poor candidate for interventional procedure to exclude underlying CAD. Continue medical management with ASA and heparin per consult team.  Hypokalemia P: - Daily bmp - Replete - Recheck BMP this afternoon  Hypothyroidism P:   - Continue synthroid  Goals of care We discussed plan for potential extubation in the future. Her son, [name], expresses that he would want [place] to continue treatment for any reversible treatment but would not want his mother to have prolonged suffering on the ventilator. After discussion, we agreed that once  her respiratory status was optimized and she was passing SBTs, one-way extubation with no plan to re-intubate would be consistent with her goals of care. [name] also expressed that if his mother were to suddenly decline, he would want the team to contact him to consider transitioning to comfort care.   Best practice:  Diet: TF Pain/Anxiety/Delirium protocol (if indicated): as needed fentanyl and versed VAP protocol (if indicated):  In place DVT prophylaxis: systemic heparin GI prophylaxis: protonix Glucose control: ssi Mobility: bedrest Code Status: DNR/DNI Family Communication: Updated son Galit Cinnamon and discussed [place] as noted above on 11/20 Disposition: ICU  Labs   CBC: Recent Labs  Lab 10/11/19 0021  10/11/19 0429 10/11/19 0525 10/12/19 0231 10/13/19 0602 10/14/19 0456  WBC 18.6*  --  15.3*  --  15.9* 10.0 7.8  NEUTROABS 16.0*  --   --   --   --   --   --   HGB 16.3*   < > 13.6 12.6 12.1 10.8* 11.2*  HCT 49.6*   < > 41.8 37.0 36.6 33.4* 33.7*  MCV 96.7  --  98.6  --  97.3 100.6* 95.7  PLT 185  --  146*  --  157 127* 161   < > = values in this interval not displayed.    Basic Metabolic Panel: Recent Labs  Lab 10/11/19 0021  10/11/19 0049 10/11/19 0247 10/11/19 0429 10/11/19 0525 10/13/19 0602 10/13/19 1642 10/14/19 0456  NA 139   < > 140 141  --  140 141  --  140  K 3.1*   < > 3.6 3.4*  --  4.5 3.1*  --  3.1*  CL 104  --  110  --   --   --  109  --  108  CO2 21*  --  20*  --   --   --  21*  --  22  GLUCOSE 196*  --  160*  --   --   --  89  --  114*  BUN 21  --  21  --   --   --  24*  --  28*  CREATININE 1.13*  --  1.01*  --   --   --  0.73  --  0.61  CALCIUM 9.1  --  7.6*  --   --   --  7.9*  --  8.3*  MG  --   --   --   --  2.3  --  2.0 2.1 2.0  PHOS  --   --   --   --  3.2  --  2.1* 2.4* 2.7   < > = values in this interval not displayed.   GFR: Estimated Creatinine Clearance: 47.3 mL/min (by C-G formula based on SCr of 0.61 mg/dL). Recent Labs  Lab  10/11/19 0021 10/11/19 0246 10/11/19 0429 10/12/19 0231 10/13/19 0602 10/14/19 0456  PROCALCITON  --  0.15  --   --   --   --   WBC 18.6*  --  15.3* 15.9* 10.0 7.8  LATICACIDVEN 3.0* 2.4*  --   --   --   --     Liver Function Tests: Recent Labs  Lab 10/11/19 0049  AST 46*  ALT 22  ALKPHOS 38  BILITOT 1.1  PROT 5.3*  ALBUMIN 3.1*   No results for input(s): LIPASE, AMYLASE in the last 168 hours. No results for input(s): AMMONIA in the last 168 hours.  ABG    Component Value Date/Time   PHART 7.298 (L) 10/11/2019 0525   PCO2ART 39.6 10/11/2019 0525   PO2ART 121.0 (H) 10/11/2019 0525   HCO3 19.1 (L) 10/11/2019 0525   TCO2 20 (L) 10/11/2019 0525   ACIDBASEDEF 6.0 (H) 10/11/2019 0525   O2SAT 98.0 10/11/2019 0525     Coagulation Profile: Recent Labs  Lab 10/11/19 0429  INR 1.3*    Cardiac Enzymes: No results for input(s): CKTOTAL, CKMB, CKMBINDEX, TROPONINI in the last 168 hours.  HbA1C: Hgb A1c MFr Bld  Date/Time Value Ref Range Status  12/20/2018 04:56 PM 5.5 <5.7 % of total Hgb Final    Comment:    For the purpose of screening for the presence of diabetes: . <5.7%       Consistent with the absence of diabetes 5.7-6.4%    Consistent with increased risk for diabetes             (prediabetes) > or =6.5%  Consistent with diabetes . This assay result is consistent with a decreased risk of diabetes. . Currently, no consensus exists regarding use of hemoglobin A1c for diagnosis of diabetes in children. . According to American Diabetes Association (ADA) guidelines, hemoglobin A1c <7.0% represents optimal control in non-pregnant diabetic patients. Different metrics may apply to specific patient populations.  Standards of Medical Care in Diabetes(ADA). Marland Kitchen   06/02/2012 03:11 PM 5.4 <5.7 % Final    Comment:  According to the ADA Clinical Practice Recommendations for 2011, when HbA1c  is used as a screening test:     >=6.5%   Diagnostic of Diabetes Mellitus            (if abnormal result is confirmed)   5.7-6.4%   Increased risk of developing Diabetes Mellitus   References:Diagnosis and Classification of Diabetes Mellitus,Diabetes [id number] 1):S62-S69 and Standards of Medical Care in         Diabetes - 2011,Diabetes [id number] (Suppl 1):S11-S61.      CBG: Recent Labs  Lab 10/13/19 1503 10/13/19 1920 10/13/19 2306 10/14/19 0301 10/14/19 0709  GLUCAP 75 112* 113* 115* 97   Critical care time: 39 min    The patient is critically ill with multiple organ systems failure and requires high complexity decision making for assessment and support, frequent evaluation and titration of therapies, application of advanced monitoring technologies and extensive interpretation of multiple databases.   Rodman Pickle, M.D. [facility] Pulmonary/Critical Care Medicine 10/14/2019 7:38 AM  Pager: [phone] After hours pager: [phone]

## 2019-10-14 NOTE — Progress Notes (Signed)
[place] for Heparin Indication: chest pain/ACS  Allergies  Allergen Reactions  . Ambien [Zolpidem]     confusion  . Pyridium [Phenazopyridine Hcl] Other (See Comments)    hemolysis  . Sulfa Antibiotics Rash    Patient Measurements: Height: 5' 2.5" (158.8 cm) Weight: 156 lb 15.5 oz (71.2 kg) IBW/kg (Calculated) : 51.25 Heparin Dosing Weight: 64kg  Vital Signs: Temp: 98.8 F (37.1 C) (11/20 0700) Temp Source: Axillary (11/20 0700) BP: 137/63 (11/20 0816) Pulse Rate: 77 (11/20 0816)  Labs: Recent Labs    10/12/19 0231 10/12/19 1122 10/12/19 1301  10/13/19 0602 10/13/19 2208 10/14/19 0456  HGB 12.1  --   --   --  10.8*  --  11.2*  HCT 36.6  --   --   --  33.4*  --  33.7*  PLT 157  --   --   --  127*  --  161  HEPARINUNFRC 0.49  --   --    < > 0.20* 0.31 0.38  CREATININE  --   --   --   --  0.73  --  0.61  TROPONINIHS  --  511* 474*  --   --   --   --    < > = values in this interval not displayed.    Estimated Creatinine Clearance: 47.3 mL/min (by C-G formula based on SCr of 0.61 mg/dL).   Medical History: Past Medical History:  Diagnosis Date  . Allergy   . Anxiety   . Arthritis   . Blood transfusion without reported diagnosis   . Breast cancer ([place])    breast, s/p RXT, bilateral  . Cataract   . CKD (chronic kidney disease), stage III   . Delirium   . Dementia ([place])   . History of echocardiogram    a. Echo 8/16: Vigorous LVF, EF 38-10%, grade 1 diastolic dysfunction, trivial AI, mild MR, mild RVE, normal RV function, PASP 33 mmHg // b. Echo 1/17 EF 65-70%, normal wall motion, grade 1 diastolic dysfunction, trivial AI, MAC, mild MR // c. Echo 11/26/16: mild LVH, EF 60-65, mild AI, mild MR, mild LAE, mild to mod TR  . History of stress test    Lexiscan Myoview 8/16:  EF 81%, breast attenuation, no ischemia; Low Risk // Myoview 11/26/16: EF 75, apical defect c/w soft tissue attenuation, no ischemia or scar; Low Risk  . HOCM  (hypertrophic obstructive cardiomyopathy) ([place])   . Hyperlipidemia   . Hypertension   . Hypothyroidism   . Personal history of radiation therapy   . Premature atrial contractions    Assessment: 35 YOF presenting from SNF in acute respiratory distress 2/2 pna, increasing troponin 960>1191, and concern for ACS.  Not on anticoagulation PTA. Pharmacy consulted to dose IV heparin therapy.   Heparin level is therapeutic on 850 units/hr.  Hemoglobin/Hematocrit is stable.  Platelets have improved at 161.  No bleeding or issues with infusion noted.  Day #4 of IV heparin drip - no plan for cardiac intervention at this time per Cardiology note on 11/18.   Goal of Therapy:  Heparin level 0.3-0.7 units/ml Monitor platelets by anticoagulation protocol: Yes   Plan:  Continue heparin at 850 units/hr Monitor daily heparin level and CBC while on therapy.  Will re-address length of therapy with Cardiology.   Sloan Leiter, PharmD, BCPS, BCCCP Clinical Pharmacist Please refer to [facility] for [place] numbers 10/14/2019, 9:10 AM

## 2019-10-14 NOTE — Procedures (Signed)
Extubation Procedure Note  Patient Details:   Name: Hannah Galvan DOB: [birth year] MRN: [id number]   Airway Documentation:    Vent end date: 10/14/19 Vent end time: 1537   Evaluation  O2 sats: stable throughout Complications: No apparent complications Patient did tolerate procedure well. Bilateral Breath Sounds: Diminished, Rhonchi   Yes   PT was extubated to a 3L [place]  PT has strong cough and is able to clear secretions   PT is stable and able to talk  RT to monitor   Brissia Delisa, Leonie Douglas 10/14/2019, 3:39 PM

## 2019-10-14 NOTE — Progress Notes (Signed)
Nutrition Follow-up / Consult  DOCUMENTATION CODES:   Not applicable  INTERVENTION:   Continue TF via OG tube:   Vital AF 1.2 increase to 50 ml/h (1200 ml per day)   D/C Pro-stat   Provides 1440 kcal, 90 gm protein, 973 ml free water daily  NUTRITION DIAGNOSIS:   Inadequate oral intake related to inability to eat as evidenced by NPO status.  Ongoing   GOAL:   Patient will meet greater than or equal to 90% of their needs   Met with TF  MONITOR:   Vent status, Labs, TF tolerance, Skin  REASON FOR ASSESSMENT:   Consult Enteral/tube feeding initiation and management  ASSESSMENT:   85 yo female admitted with acute respiratory distress requiring intubation. COVID-19 negative. PMH includes dementia, hypothyroidism, HTN, HLD, breast cancer.   Discussed patient in ICU rounds and with RN today. Currently receiving Vital AF 1.2 at 30 ml/h with Pro-stat 30 ml TID via OG tube. Tolerating well. RD to adjust TF goal since Propofol is now off.   Patient remains intubated on ventilator support MV: 13.8 L/min Temp (24hrs), Avg:98.5 F (36.9 C), Min:97.6 F (36.4 C), Max:99.5 F (37.5 C)  Propofol is currently off Labs reviewed. Potassium 3.1 (L) CBG's: [id number] Medications reviewed.   I/O + 2.2 L since admission Weight up by 5.9 kg  NUTRITION - FOCUSED PHYSICAL EXAM:  unable to complete  Diet Order:   Diet Order            Diet NPO time specified  Diet effective now              EDUCATION NEEDS:   Not appropriate for education at this time  Skin:  Skin Assessment: Reviewed RN Assessment  Last BM:  11/18 type 7  Height:   Ht Readings from Last 1 Encounters:  10/11/19 5' 2.5" (1.588 m)    Weight:   Wt Readings from Last 1 Encounters:  10/14/19 71.2 kg   Admission weight 65.3 kg  Ideal Body Weight:  51.1 kg  BMI:  Body mass index is 28.25 kg/m.  Estimated Nutritional Needs:   Kcal:  1425  Protein:  90-100 gm  Fluid:  >/= 1.5  L    Molli Barrows, RD, LDN, [place] Pager [phone] After Hours Pager [phone]

## 2019-10-14 NOTE — Progress Notes (Signed)
[facility] ADULT ICU REPLACEMENT PROTOCOL FOR AM LAB REPLACEMENT ONLY  The patient does apply for the [facility] Adult ICU Electrolyte Replacment Protocol based on the criteria listed below:   1. Is GFR >/= 40 ml/min? Yes.    Patient's GFR today is >60 2. Is urine output >/= 0.5 ml/kg/hr for the last 6 hours? Yes.   Patient's UOP is 1.4 ml/kg/hr 3. Is BUN < 60 mg/dL? Yes.    Patient's BUN today is 28 4. Abnormal electrolyte(s): K-3.1 5. Ordered repletion with: per protocol 6. If a panic level lab has been reported, has the CCM MD in charge been notified? Yes.  .   Physician:  Dr. Terrill Mohr, Philis Nettle 10/14/2019 6:16 AM

## 2019-10-15 DIAGNOSIS — R5381 Other malaise: Secondary | ICD-10-CM

## 2019-10-15 DIAGNOSIS — Z7189 Other specified counseling: Secondary | ICD-10-CM

## 2019-10-15 LAB — CBC
HCT: 36.3 % (ref 36.0–46.0)
Hemoglobin: 12.2 g/dL (ref 12.0–15.0)
MCH: 32.2 pg (ref 26.0–34.0)
MCHC: 33.6 g/dL (ref 30.0–36.0)
MCV: 95.8 fL (ref 80.0–100.0)
Platelets: 157 10*3/uL (ref 150–400)
RBC: 3.79 MIL/uL — ABNORMAL LOW (ref 3.87–5.11)
RDW: 13.2 % (ref 11.5–15.5)
WBC: 8.2 10*3/uL (ref 4.0–10.5)
nRBC: 0 % (ref 0.0–0.2)

## 2019-10-15 LAB — BASIC METABOLIC PANEL
Anion gap: 12 (ref 5–15)
BUN: 24 mg/dL — ABNORMAL HIGH (ref 8–23)
CO2: 20 mmol/L — ABNORMAL LOW (ref 22–32)
Calcium: 8.8 mg/dL — ABNORMAL LOW (ref 8.9–10.3)
Chloride: 107 mmol/L (ref 98–111)
Creatinine, Ser: 0.72 mg/dL (ref 0.44–1.00)
GFR calc Af Amer: >60 mL/min (ref >60)
GFR calc non Af Amer: >60 mL/min (ref >60)
Glucose, Bld: 91 mg/dL (ref 70–99)
Potassium: 3.9 mmol/L (ref 3.5–5.1)
Sodium: 139 mmol/L (ref 135–145)

## 2019-10-15 LAB — GLUCOSE, CAPILLARY
Glucose-Capillary: 68 mg/dL — ABNORMAL LOW (ref 70–99)
Glucose-Capillary: 78 mg/dL (ref 70–99)
Glucose-Capillary: 80 mg/dL (ref 70–99)
Glucose-Capillary: 63 mg/dL — ABNORMAL LOW (ref 70–99)
Glucose-Capillary: 110 mg/dL — ABNORMAL HIGH (ref 70–99)
Glucose-Capillary: 93 mg/dL (ref 70–99)
Glucose-Capillary: 110 mg/dL — ABNORMAL HIGH (ref 70–99)
Glucose-Capillary: 89 mg/dL (ref 70–99)

## 2019-10-15 LAB — TRIGLYCERIDES: Triglycerides: 83 mg/dL (ref <150)

## 2019-10-15 LAB — HEPARIN LEVEL (UNFRACTIONATED): Heparin Unfractionated: 0.45 IU/mL (ref 0.30–0.70)

## 2019-10-15 MED ORDER — ALPRAZOLAM 0.25 MG PO TABS
0.2500 mg | ORAL_TABLET | Freq: Once | ORAL | Status: AC
  Administered 2019-10-15: 0.25 mg via ORAL
  Filled 2019-10-15: qty 1

## 2019-10-15 MED ORDER — PANTOPRAZOLE SODIUM 40 MG PO PACK
40.0000 mg | PACK | Freq: Every day | ORAL | Status: DC
  Administered 2019-10-15 – 2019-10-23 (×7): 40 mg
  Filled 2019-10-15 (×8): qty 20

## 2019-10-15 MED ORDER — LORAZEPAM 0.5 MG PO TABS
0.5000 mg | ORAL_TABLET | Freq: Every evening | ORAL | Status: DC | PRN
  Administered 2019-10-15 – 2019-10-24 (×4): 0.5 mg via ORAL
  Filled 2019-10-15 (×5): qty 1

## 2019-10-15 MED ORDER — LEVOTHYROXINE SODIUM 75 MCG PO TABS
125.0000 ug | ORAL_TABLET | Freq: Every day | ORAL | Status: DC
  Administered 2019-10-15 – 2019-10-24 (×10): 125 ug
  Filled 2019-10-15 (×11): qty 1

## 2019-10-15 NOTE — Progress Notes (Signed)
[place] for Heparin Indication: chest pain/ACS  Allergies  Allergen Reactions  . Ambien [Zolpidem]     confusion  . Pyridium [Phenazopyridine Hcl] Other (See Comments)    hemolysis  . Sulfa Antibiotics Rash    Patient Measurements: Height: 5' 2.5" (158.8 cm) Weight: 152 lb 1.9 oz (69 kg) IBW/kg (Calculated) : 51.25 Heparin Dosing Weight: 64kg  Vital Signs: Temp: 101.1 F (38.4 C) (11/21 0700) Temp Source: Axillary (11/21 0700) BP: 132/55 (11/21 0600) Pulse Rate: 71 (11/21 0600)  Labs: Recent Labs    10/12/19 1122 10/12/19 1301  10/13/19 0602 10/13/19 2208 10/14/19 0456 10/14/19 1714 10/15/19 0330  HGB  --   --    < > 10.8*  --  11.2*  --  12.2  HCT  --   --   --  33.4*  --  33.7*  --  36.3  PLT  --   --   --  127*  --  161  --  157  HEPARINUNFRC  --   --    < > 0.20* 0.31 0.38  --  0.45  CREATININE  --   --    < > 0.73  --  0.61 0.81 0.72  TROPONINIHS 511* 474*  --   --   --   --   --   --    < > = values in this interval not displayed.    Estimated Creatinine Clearance: 46.5 mL/min (by C-G formula based on SCr of 0.72 mg/dL).   Medical History: Past Medical History:  Diagnosis Date  . Allergy   . Anxiety   . Arthritis   . Blood transfusion without reported diagnosis   . Breast cancer ([place])    breast, s/p RXT, bilateral  . Cataract   . CKD (chronic kidney disease), stage III   . Delirium   . Dementia ([place])   . History of echocardiogram    a. Echo 8/16: Vigorous LVF, EF 49-67%, grade 1 diastolic dysfunction, trivial AI, mild MR, mild RVE, normal RV function, PASP 33 mmHg // b. Echo 1/17 EF 65-70%, normal wall motion, grade 1 diastolic dysfunction, trivial AI, MAC, mild MR // c. Echo 11/26/16: mild LVH, EF 60-65, mild AI, mild MR, mild LAE, mild to mod TR  . History of stress test    Lexiscan Myoview 8/16:  EF 81%, breast attenuation, no ischemia; Low Risk // Myoview 11/26/16: EF 75, apical defect c/w soft tissue  attenuation, no ischemia or scar; Low Risk  . HOCM (hypertrophic obstructive cardiomyopathy) ([place])   . Hyperlipidemia   . Hypertension   . Hypothyroidism   . Personal history of radiation therapy   . Premature atrial contractions    Assessment: 44 YOF presenting from SNF in acute respiratory distress 2/2 pna, increasing troponin 960>1191, and concern for ACS.  Not on anticoagulation PTA. Pharmacy consulted to dose IV heparin therapy.   Heparin level is therapeutic 0.45 on 850 units/hr.  Hemoglobin/Hematocrit and plts stable.  No bleeding or issues with infusion noted.  Day #5 of IV heparin drip - no plan for cardiac intervention at this time per Cardiology note on 11/18.   Goal of Therapy:  Heparin level 0.3-0.7 units/ml Monitor platelets by anticoagulation protocol: Yes   Plan:  Continue heparin at 850 units/hr Monitor daily heparin level and CBC while on therapy.  Will re-address length of therapy with Cardiology.   Nicoletta Dress, PharmD PGY2 Infectious Disease Pharmacy Resident  Please refer to [facility]  for [place] numbers 10/15/2019, 7:40 AM

## 2019-10-15 NOTE — Plan of Care (Signed)

## 2019-10-15 NOTE — Evaluation (Signed)
Occupational Therapy Evaluation Patient Details Name: Hannah Galvan MRN: [id number] DOB: Feb 05, 1931 Today's Date: 10/15/2019    History of Present Illness Pt is an 85 yo female s/p respiratory distress found to have multifocal PNA and pulmonary edema on 4L O2 PMHx: br C, CKD stage III, dementia, HTN.   Clinical Impression   Pt PTA: Pt from SNF and required assist for ADL and mobility unsure of exact level required. Pt currently limited by decreased activity tolerance, limited by pain in L hip, feet and decreased strength and endurance. Pt totalA +2 for bed mobility. Pt totalA +2 for squat pivot to recliner with no initiation noted. Pt able to sit EOB with minA overall for stability, but not always requiring assist. Pt set-upA to wash face, but unable to assist with combing hair. Pt would benefit from continued OT skilled services for ADL, mobility and safety in SNF setting. OT following.    Follow Up Recommendations  SNF;Supervision/Assistance - 24 hour    Equipment Recommendations  Other (comment)(to be determined at next venue)    Recommendations for Other Services       Precautions / Restrictions Precautions Precautions: Fall;Other (comment) Precaution Comments: dementia Restrictions Weight Bearing Restrictions: No      Mobility Bed Mobility Overal bed mobility: Needs Assistance Bed Mobility: Supine to Sit     Supine to sit: +2 for physical assistance;Total assist     General bed mobility comments: helicopter method using bed pad for transition to EOB  Transfers Overall transfer level: Needs assistance   Transfers: Squat Pivot Transfers     Squat pivot transfers: +2 physical assistance;Total assist     General transfer comment: Attempted sit to stand +2 total assist. Pt unable to achieve full upright stance. Squat pivot transfer using bed pad to lift/support bottom.    Balance Overall balance assessment: Needs assistance Sitting-balance support: Feet  unsupported;Bilateral upper extremity supported Sitting balance-Leahy Scale: Fair Sitting balance - Comments: Pt sat EOB min/min guard assist.     Standing balance-Leahy Scale: Zero Standing balance comment: total assist for standing/transfer                           ADL either performed or assessed with clinical judgement   ADL Overall ADL's : Needs assistance/impaired Eating/Feeding: Moderate assistance   Grooming: Minimal assistance;Sitting;Bed level   Upper Body Bathing: Minimal assistance;Sitting;Bed level   Lower Body Bathing: Total assistance;Sitting/lateral leans;Sit to/from stand   Upper Body Dressing : Minimal assistance;Sitting;Standing   Lower Body Dressing: Total assistance;+2 for physical assistance;+2 for safety/equipment;Sitting/lateral leans;Sit to/from stand   Toilet Transfer: Total assistance;+2 for physical assistance;+2 for safety/equipment;Cueing for safety;Cueing for sequencing;Squat-pivot   Toileting- Clothing Manipulation and Hygiene: Total assistance;+2 for physical assistance;+2 for safety/equipment;Cueing for safety;Cueing for sequencing;Sitting/lateral lean;Sit to/from stand       Functional mobility during ADLs: Total assistance;+2 for physical assistance;+2 for safety/equipment General ADL Comments: Pt totalA +2 for squat pivot to recliner; pt limited by pain in L hip, feet and decreased strength and activity tolerance.     Vision Baseline Vision/History: No visual deficits Vision Assessment?: No apparent visual deficits     Perception     Praxis      Pertinent Vitals/Pain Pain Assessment: Faces Faces Pain Scale: Hurts little more Pain Location: L hip, feet Pain Descriptors / Indicators: Discomfort;Grimacing;Sore Pain Intervention(s): Limited activity within patient's tolerance     Hand Dominance Right   Extremity/Trunk Assessment Upper Extremity Assessment Upper Extremity  Assessment: Generalized weakness RUE Deficits /  Details: AROM, WFLs  LUE Deficits / Details: Swelling noted, elevated on pillow   Lower Extremity Assessment Lower Extremity Assessment: Defer to PT evaluation;Generalized weakness   Cervical / Trunk Assessment Cervical / Trunk Assessment: Kyphotic   Communication Communication Communication: HOH   Cognition Arousal/Alertness: Awake/alert Behavior During Therapy: Flat affect Overall Cognitive Status: No family/caregiver present to determine baseline cognitive functioning                                 General Comments: dementia at baseline. Following simple commands. Very soft voice and not expanding on any topic.   General Comments  Pt 4L O2 >90% after exertion; HR 100 BPM.    Exercises     Shoulder Instructions      Home Living Family/patient expects to be discharged to:: Skilled nursing facility                                 Additional Comments: Unsure of PLOF: pt unable to expand      Prior Functioning/Environment Level of Independence: Needs assistance  Gait / Transfers Assistance Needed: Appears flexed- too stiff for mobility ADL's / Homemaking Assistance Needed: grooming- set-up A; otherwise requires assist            OT Problem List: Decreased strength;Decreased activity tolerance;Impaired balance (sitting and/or standing);Decreased safety awareness;Pain      OT Treatment/Interventions: Self-care/ADL training;Therapeutic exercise;Neuromuscular education;Energy conservation;DME and/or AE instruction;Therapeutic activities;Patient/family education;Balance training    OT Goals(Current goals can be found in the care plan section) Acute Rehab OT Goals Patient Stated Goal: not stated OT Goal Formulation: With patient Time For Goal Achievement: 10/29/19 Potential to Achieve Goals: Good ADL Goals Pt Will Perform Eating: with set-up;sitting Pt Will Perform Grooming: with set-up;sitting Pt Will Perform Lower Body Dressing: with mod  assist;sitting/lateral leans Pt Will Transfer to Toilet: with mod assist;squat pivot transfer;bedside commode Pt/caregiver will Perform Home Exercise Program: Increased strength;Both right and left upper extremity  OT Frequency: Min 2X/week   Barriers to D/C:            Co-evaluation              AM-PAC OT "6 Clicks" Daily Activity     Outcome Measure Help from another person eating meals?: A Lot Help from another person taking care of personal grooming?: A Lot Help from another person toileting, which includes using toliet, bedpan, or urinal?: Total Help from another person bathing (including washing, rinsing, drying)?: Total Help from another person to put on and taking off regular upper body clothing?: A Lot Help from another person to put on and taking off regular lower body clothing?: Total 6 Click Score: 9   End of Session Equipment Utilized During Treatment: Gait belt;Rolling walker Nurse Communication: Mobility status  Activity Tolerance: Patient tolerated treatment well Patient left: in chair;with call bell/phone within reach;with chair alarm set  OT Visit Diagnosis: Unsteadiness on feet (R26.81);Muscle weakness (generalized) (M62.81);Pain Pain - Right/Left: Left Pain - part of body: Hip                Time: 0827-0850 OT Time Calculation (min): 23 min Charges:  OT General Charges $OT Visit: 1 Visit OT Evaluation $OT Eval Moderate Complexity: 1 Mod Darryl Nestle) Marsa Aris OTR/L Acute Rehabilitation Services Pager: [phone] Office: [place]  10/15/2019, 4:16 PM

## 2019-10-15 NOTE — Progress Notes (Addendum)
NAME:  Hannah Galvan, MRN:  [id number], DOB:  [birth year], LOS: 4 ADMISSION DATE:  10/11/2019, CONSULTATION DATE:  11/17 REFERRING MD:  Dr. Dina Rich, CHIEF COMPLAINT:  Acute Respiratory failure   Brief History   85 year old female who presented from outside skilled nursing facility with acute respiratory distress, COVID-19 negative on admission.  Patient suffered worsening respiratory distress in emergency department and was subsequently intubated.  PCCM consulted for admission  History of present illness   85 year old female with extensive past medical history including but not limited to hypertrophic obstructive cardiomyopathy and advanced dementia who to the emergency department via EMS from local skilled nursing facility in acute respiratory distress.  According to chart review patient was found on a nightly rounds with increased respiratory distress and work of breathing with an oxygen saturation of 60% on room air. She was placed on CPAP by EMS with improvement in saturations patient reports she did report that she was mildly short of breath prior to going to bed tonight.    Current lab work impressive for lactic acidosis, leukocytosis, elevated BNP, and hypokalemia.  Chest x-ray with signs of early opacities concerning for pulmonary edema versus multifocal infection.  While in the emergency department due to concern for COVID-19 patient was removed from NIV therapy with eventual return of increased work of breathing and signs of respiratory fatigue prompting need for endotracheal intubation.  PCCM consulted for admission  Past Medical History   has a past medical history of Allergy, Anxiety, Arthritis, Blood transfusion without reported diagnosis, Breast cancer ([place]), Cataract, CKD (chronic kidney disease), stage III, Delirium, Dementia ([place]), History of echocardiogram, History of stress test, HOCM (hypertrophic obstructive cardiomyopathy) ([place]), Hyperlipidemia, Hypertension, Hypothyroidism,  Personal history of radiation therapy, and Premature atrial contractions.   Significant Hospital Events   11/17 admitted 11/18 TTE 11/17 - Depressed EF from 55% to 25%. Not a candidate for cardiac intervention 11/19 Off pressors. Failed SBT due to tachypnea.  11/20 Diureses  Consults:  Cardiology  Procedures:  11/17 - ETT  Significant Diagnostic Tests:  CXR - diffuse heterogeneous mixed interstitial and patchy airspace opacities, right greater than the left.  Findings represent possible pulmonary edema, multifocal infection, or combination.  Small bilateral pleural effusions.  TTE 10/11/19 showed EF 25-30%, severely decreased LV function, G1DD, akinesis of atneroseptal wall and apex.,  normal RV function.  Compared to echocardiogram 11/2017, worsening EF  Micro Data:  COVID-19 11/17 >> negative Blood culture 11/17 >> Urine culture 1117 >> Respiratory virus panel 11/17 >> Sputum culture 11/17 >> MRSA PCR 11/17 >>  Antimicrobials:  Cefepime 11/17 >>11/20 Vancomycin 11/17 >>11/20 Doxycycline 11/17 >>11/20  Interim history/subjective:  Opens eyes to voice. Does not follow commands but still on sedation. Diuresed well yesterday.  Objective   Blood pressure (!) 132/55, pulse 71, temperature (!) 101.1 F (38.4 C), temperature source Axillary, resp. rate (!) 25, height 5' 2.5" (1.588 m), weight 69 kg, SpO2 100 %.    Vent Mode: PRVC FiO2 (%):  [40 %] 40 % Set Rate:  [16 bmp] 16 bmp Vt Set:  [410 mL] 410 mL PEEP:  [5 cmH20] 5 cmH20 Plateau Pressure:  [15 cmH20-26 cmH20] 15 cmH20   Intake/Output Summary (Last 24 hours) at 10/15/2019 0759 Last data filed at 10/15/2019 0600 Gross per 24 hour  Intake 780.36 ml  Output 3150 ml  Net -2369.64 ml   Filed Weights   10/13/19 0352 10/14/19 0344 10/15/19 0500  Weight: 70.7 kg 71.2 kg 69 kg   Physical  Exam: General: Frail, elderly female, sedated HENT: [place], AT, ETT in place Eyes: EOMI, no scleral icterus Respiratory: Clear to  auscultation bilaterally.  No crackles, wheezing or rales Cardiovascular: RRR, -M/R/G, no JVD GI: BS+, soft, nontender Extremities:-Edema,-tenderness Neuro: Opens eyes to voice, does not follow commands, moves extremities x 4 GU: [place] in place  Resolved Hospital Problem list   AKI Septic shock  Assessment & Plan:   Acute hypoxic respiratory failure, improved -pulmonary edema, PNA Echo read on 11/17 showed EF 25-30% with severe hypokinesis.  -extubated 11/20; one way extubation P:   -DNR  -Supportive O2 for SpO2 > 92%  Cardiomyopathy-- hypertrophic obstructive  Demand ischemic related to worsening heart failure - peak trop 1191 Echo on 10/11/19 showed EF 25-30%, severely decreased LV function, G1DD, akinesis of atneroseptal wall and apex.,  normal RV function.  Compared to echocardiogram 11/2017, worsening EF P: - Appreciate Cardiology input. Patient poor candidate for interventional procedure to exclude underlying CAD. Continue medical management with ASA and heparin per consult team.   Hypothyroidism P:   - Continue synthroid  Debilitation -PT/OT -SLP  Goals of care -1-way extubation 11/20  Best practice:  Diet: NPO pending SLP evaluation Pain/Anxiety/Delirium protocol (if indicated): na VAP protocol (if indicated):  na DVT prophylaxis: heparin GI prophylaxis: protonix Glucose control: ssi Mobility: bedrest Code Status: DNR/DNI Family Communication: Patient updated 11/21 Disposition: Med tele   Labs   CBC: Recent Labs  Lab 10/11/19 0021  10/11/19 0429 10/11/19 0525 10/12/19 0231 10/13/19 0602 10/14/19 0456 10/15/19 0330  WBC 18.6*  --  15.3*  --  15.9* 10.0 7.8 8.2  NEUTROABS 16.0*  --   --   --   --   --   --   --   HGB 16.3*   < > 13.6 12.6 12.1 10.8* 11.2* 12.2  HCT 49.6*   < > 41.8 37.0 36.6 33.4* 33.7* 36.3  MCV 96.7  --  98.6  --  97.3 100.6* 95.7 95.8  PLT 185  --  146*  --  157 127* 161 157   < > = values in this interval not displayed.     Basic Metabolic Panel: Recent Labs  Lab 10/11/19 0049  10/11/19 0429 10/11/19 0525 10/13/19 0602 10/13/19 1642 10/14/19 0456 10/14/19 1714 10/15/19 0330  NA 140   < >  --  140 141  --  140 140 139  K 3.6   < >  --  4.5 3.1*  --  3.1* 4.1 3.9  CL 110  --   --   --  109  --  108 108 107  CO2 20*  --   --   --  21*  --  22 20* 20*  GLUCOSE 160*  --   --   --  89  --  114* 91 91  BUN 21  --   --   --  24*  --  28* 29* 24*  CREATININE 1.01*  --   --   --  0.73  --  0.61 0.81 0.72  CALCIUM 7.6*  --   --   --  7.9*  --  8.3* 8.6* 8.8*  MG  --   --  2.3  --  2.0 2.1 2.0 2.0  --   PHOS  --   --  3.2  --  2.1* 2.4* 2.7 2.8  --    < > = values in this interval not displayed.   GFR: Estimated Creatinine Clearance: 46.5  mL/min (by C-G formula based on SCr of 0.72 mg/dL). Recent Labs  Lab 10/11/19 0021 10/11/19 0246  10/12/19 0231 10/13/19 0602 10/14/19 0456 10/15/19 0330  PROCALCITON  --  0.15  --   --   --   --   --   WBC 18.6*  --    < > 15.9* 10.0 7.8 8.2  LATICACIDVEN 3.0* 2.4*  --   --   --   --   --    < > = values in this interval not displayed.    Liver Function Tests: Recent Labs  Lab 10/11/19 0049  AST 46*  ALT 22  ALKPHOS 38  BILITOT 1.1  PROT 5.3*  ALBUMIN 3.1*   No results for input(s): LIPASE, AMYLASE in the last 168 hours. No results for input(s): AMMONIA in the last 168 hours.  ABG    Component Value Date/Time   PHART 7.298 (L) 10/11/2019 0525   PCO2ART 39.6 10/11/2019 0525   PO2ART 121.0 (H) 10/11/2019 0525   HCO3 19.1 (L) 10/11/2019 0525   TCO2 20 (L) 10/11/2019 0525   ACIDBASEDEF 6.0 (H) 10/11/2019 0525   O2SAT 98.0 10/11/2019 0525     Coagulation Profile: Recent Labs  Lab 10/11/19 0429  INR 1.3*    Cardiac Enzymes: No results for input(s): CKTOTAL, CKMB, CKMBINDEX, TROPONINI in the last 168 hours.  HbA1C: Hgb A1c MFr Bld  Date/Time Value Ref Range Status  12/20/2018 04:56 PM 5.5 <5.7 % of total Hgb Final    Comment:    For  the purpose of screening for the presence of diabetes: . <5.7%       Consistent with the absence of diabetes 5.7-6.4%    Consistent with increased risk for diabetes             (prediabetes) > or =6.5%  Consistent with diabetes . This assay result is consistent with a decreased risk of diabetes. . Currently, no consensus exists regarding use of hemoglobin A1c for diagnosis of diabetes in children. . According to American Diabetes Association (ADA) guidelines, hemoglobin A1c <7.0% represents optimal control in non-pregnant diabetic patients. Different metrics may apply to specific patient populations.  Standards of Medical Care in Diabetes(ADA). Marland Kitchen   06/02/2012 03:11 PM 5.4 <5.7 % Final    Comment:                                                                           According to the ADA Clinical Practice Recommendations for 2011, when HbA1c is used as a screening test:     >=6.5%   Diagnostic of Diabetes Mellitus            (if abnormal result is confirmed)   5.7-6.4%   Increased risk of developing Diabetes Mellitus   References:Diagnosis and Classification of Diabetes Mellitus,Diabetes [id number] 1):S62-S69 and Standards of Medical Care in         Diabetes - 2011,Diabetes [id number] (Suppl 1):S11-S61.      CBG: Recent Labs  Lab 10/15/19 0436 10/15/19 0438 10/15/19 0513 10/15/19 0715 10/15/19 0756  GLUCAP 68* 45 80 41* 110*   Eliseo Gum MSN, AGACNP-BC [place] [id number] If no answer, [id number] 10/15/2019, 7:59 AM  Attending Note:  85 year old female with acute respiratory failure that was extubated yesterday for pneumonia.  No events overnight.  On exam, coarse BS diffusely.  I reviewed CXR myself, infiltrate noted and ETT was in a good position.  Discussed with PCCM-NP.  Acute respiratory failure:  - Extubated  - DNR status  Pneumonia:  - Abx as ordered  - F/u on cultures  Code status:  - DNR  - If  deteriorate then transition to comfort  Hypoxemia:  - Titrate O2 for sat of 88-92%  Physical deconditioning:  - PT/OT  - OOB  Transfer out of the ICU and to [facility] service with PCCM off 11/22.  Patient seen and examined, agree with above note.  I dictated the care and orders written for this patient under my direction.  Rush Farmer, M.D. [facility].

## 2019-10-15 NOTE — Progress Notes (Signed)
Hypoglycemic Event  CBG: 63  Treatment: Oral Orange Juice with Sugar packets per [name] NP  Symptoms: None Found on morning CBG  Follow-up CBG: Time:0756 CBG Result:110  Possible Reasons for Event: NPO pending Swallow study after intubation  Comments/MD notified:[name], NP with CCM notified with no new orders.     Hannah Galvan

## 2019-10-15 NOTE — Progress Notes (Signed)
eLink Physician-Brief Progress Note Patient Name: Hannah Galvan DOB: 07-10-1931 MRN: [id number]   Date of Service  10/15/2019  HPI/Events of Note  Anxiety - Patient takes Ativan at home. Patient requests home ativan. Admitted for multifocal pneumonia.   eICU Interventions  Will order: 1. Xanax 0.25 mg PO X 1 now.      Intervention Category Major Interventions: Other:  Lysle Dingwall 10/15/2019, 12:58 AM

## 2019-10-15 NOTE — Progress Notes (Signed)
Patient transferred to 5MW 18 with no distress noted. Family aware. Report given and no questions noted. Will transfer care at this time.

## 2019-10-15 NOTE — Progress Notes (Signed)
NEW ADMISSION NOTE New Admission Note: Patient arrived from 18M  Arrival Method:  Arrived in recliner with the staff accompanying. Mental Orientation: alert to self. Telemetry: 32M-21, NSR Assessment: Completed Skin: Bruising noted on bilateral arms, bottom red but blanchable. IV: Right FA and R anterior FA, infusing Heparin at 8.5 gtt Pain:  Denies any pain currently. Tubes:  [place] Safety Measures: Safety Fall Prevention Plan has been given, discussed and signed Admission: Completed [facility] Orientation: Patient has been orientated to the room, unit and staff.   Orders have been reviewed and implemented. Will continue to monitor the patient. Call light has been placed within reach and bed alarm has been activated.   Amaryllis Dyke, RN

## 2019-10-15 NOTE — Evaluation (Signed)
Clinical/Bedside Swallow Evaluation Patient Details  Name: BRIANNON Galvan MRN: [id number] Date of Birth: 01/19/1931  Today's Date: 10/15/2019 Time: SLP Start Time (ACUTE ONLY): 0803 SLP Stop Time (ACUTE ONLY): 0828 SLP Time Calculation (min) (ACUTE ONLY): 25 min  Past Medical History:  Past Medical History:  Diagnosis Date  . Allergy   . Anxiety   . Arthritis   . Blood transfusion without reported diagnosis   . Breast cancer ([place])    breast, s/p RXT, bilateral  . Cataract   . CKD (chronic kidney disease), stage III   . Delirium   . Dementia ([place])   . History of echocardiogram    a. Echo 8/16: Vigorous LVF, EF 08-65%, grade 1 diastolic dysfunction, trivial AI, mild MR, mild RVE, normal RV function, PASP 33 mmHg // b. Echo 1/17 EF 65-70%, normal wall motion, grade 1 diastolic dysfunction, trivial AI, MAC, mild MR // c. Echo 11/26/16: mild LVH, EF 60-65, mild AI, mild MR, mild LAE, mild to mod TR  . History of stress test    Lexiscan Myoview 8/16:  EF 81%, breast attenuation, no ischemia; Low Risk // Myoview 11/26/16: EF 75, apical defect c/w soft tissue attenuation, no ischemia or scar; Low Risk  . HOCM (hypertrophic obstructive cardiomyopathy) ([place])   . Hyperlipidemia   . Hypertension   . Hypothyroidism   . Personal history of radiation therapy   . Premature atrial contractions    Past Surgical History:  Past Surgical History:  Procedure Laterality Date  . ABDOMINAL HYSTERECTOMY  2008  . bladder tact    . BREAST LUMPECTOMY Right 2000  . BREAST LUMPECTOMY Left 2007  . CATARACT EXTRACTION  2011  . JOINT REPLACEMENT    . KNEE SURGERY     right  . ROTATOR CUFF REPAIR     right  . THYROID SURGERY  2002  . TOTAL KNEE ARTHROPLASTY Left 06/02/2013   Dr Rhona Raider  . TOTAL KNEE ARTHROPLASTY Right 06/02/2013   Procedure: RIGHT TOTAL KNEE ARTHROPLASTY;  Surgeon: Hessie Dibble, MD;  Location: [facility];  Service: Orthopedics;  Laterality: Right;  . TOTAL KNEE ARTHROPLASTY Left 08/22/2014    Procedure: TOTAL KNEE ARTHROPLASTY;  Surgeon: Hessie Dibble, MD;  Location: [facility];  Service: Orthopedics;  Laterality: Left;   HPI:  85 year old female who presented from outside skilled nursing facility with acute respiratory distress, COVID-19 negative on admission.  Patient suffered worsening respiratory distress in emergency department and was subsequently intubated. Pt was intubated from 11/17-11/20. Pt with hx of dementia, HTN, SOB, Breast Cancer with radiation therapy.  BSE completed on 01/10/19 with recommendations for Dysphagia 3 (soft) solids and thin liquid.     Assessment / Plan / Recommendation Clinical Impression  Pt presents with oral dysphagia and suspected pharyngeal dysphagia.  Pt was lethargic throughout this evaluation and she presented with a baseline cough upon SLP arrival.  Oral mech exam was remarkable for min L labial droop; however, pt exhibited full labial ROM bilaterally.   Pt exhibited delayed responses throughout this evaluation and reduced vocal intensity.  Pt was observed with trials of ice chips, thin liquid, nectar-thick liquid (NTL), honey-thick liquid (HTL), and puree.  She presented with prolonged AP transport with all trials and fair bolus acceptance, requiring cues/assistance to accept trials via spoon and cup.  Hyolaryngeal elevation/excursion appeared reduced to observation and palpation and suspect delayed swallow initiation with all trials. Pt additionally exhibited multiple swallows with puree trials, possibly indicative of pharyngeal residue.  She exhibited  an immediate cough following 2/2 ice chip trials, 2/3 thin liquid tsp trials, 2/2 NTL trials, and 1/2 HTL trials.  She additionally exhibited a delayed cough >3 minutes following puree trials.  Recommend that pt remain NPO at this time with frequent oral care and essential meds administered crushed in puree.  SLP will f/u tomorrow (11/22) to evaluate readiness for instrumental swallow study vs clinical diet  initiation.    SLP Visit Diagnosis: Dysphagia, unspecified (R13.10)    Aspiration Risk  Moderate aspiration risk    Diet Recommendation NPO except meds   Medication Administration: Crushed with puree    Other  Recommendations Oral Care Recommendations: Oral care QID;Staff/trained caregiver to provide oral care   Follow up Recommendations Skilled Nursing facility;24 hour supervision/assistance      Frequency and Duration min 2x/week  2 weeks       Prognosis Prognosis for Safe Diet Advancement: Fair      Swallow Study   General HPI: 85 year old female who presented from outside skilled nursing facility with acute respiratory distress, COVID-19 negative on admission.  Patient suffered worsening respiratory distress in emergency department and was subsequently intubated. Pt was intubated from 11/17-11/20. Pt with PMH of dementia, HTN, SOB.  BSE completed on 01/10/19 with recommendations for Dysphagia 3 (soft) solids and thin liquid.   Type of Study: Bedside Swallow Evaluation Previous Swallow Assessment: See HPI  Diet Prior to this Study: NPO Temperature Spikes Noted: Yes Respiratory Status: Nasal cannula History of Recent Intubation: Yes Length of Intubations (days): 3 days Date extubated: 10/14/19 Behavior/Cognition: Lethargic/Drowsy;Requires cueing Oral Cavity Assessment: Within Functional Limits Oral Care Completed by SLP: No Oral Cavity - Dentition: Edentulous(has poorly fitting dentures in room ) Self-Feeding Abilities: Needs assist Patient Positioning: Upright in bed Baseline Vocal Quality: Low vocal intensity Volitional Cough: Weak Volitional Swallow: Able to elicit    Oral/Motor/Sensory Function Overall Oral Motor/Sensory Function: Mild impairment Facial ROM: Within Functional Limits Facial Symmetry: Abnormal symmetry left Facial Sensation: Within Functional Limits Lingual ROM: Within Functional Limits Lingual Symmetry: Within Functional Limits   Ice Chips Ice  chips: Impaired Presentation: Spoon Oral Phase Functional Implications: Prolonged oral transit Pharyngeal Phase Impairments: Cough - Immediate;Suspected delayed Swallow   Thin Liquid Thin Liquid: Impaired Presentation: Spoon Oral Phase Impairments: Reduced labial seal Oral Phase Functional Implications: Left anterior spillage Pharyngeal  Phase Impairments: Suspected delayed Swallow;Cough - Delayed    Nectar Thick Nectar Thick Liquid: Impaired Presentation: Spoon Oral Phase Impairments: Reduced labial seal Oral phase functional implications: Prolonged oral transit Pharyngeal Phase Impairments: Cough - Immediate;Suspected delayed Swallow   Honey Thick Honey Thick Liquid: Impaired Presentation: Spoon;Cup Oral Phase Functional Implications: Prolonged oral transit Pharyngeal Phase Impairments: Decreased hyoid-laryngeal movement;Suspected delayed Swallow;Cough - Immediate   Puree Puree: Impaired Presentation: Spoon Oral Phase Impairments: Reduced labial seal Oral Phase Functional Implications: Oral residue;Prolonged oral transit Pharyngeal Phase Impairments: Suspected delayed Swallow;Multiple swallows;Cough - Delayed   Solid     Solid: Not tested     Colin Mulders M.S., CCC-SLP Acute Rehabilitation Services Office: [phone]  Elvia Collum Valeda Corzine 10/15/2019,8:49 AM

## 2019-10-15 NOTE — Evaluation (Signed)
Physical Therapy Evaluation Patient Details Name: Hannah Galvan MRN: [id number] DOB: [birth year] Today's Date: 10/15/2019   History of Present Illness  Pt is an 85 yo female s/p respiratory distress found to have multifocal PNA and pulmonary edema on 4L O2 PMHx: br C, CKD stage III, dementia, HTN.    Clinical Impression  Pt admitted with above diagnosis. On eval, pt required +2 total assist bed mobility and +2 total assist squat pivot transfer bed to recliner. Pt currently with functional limitations due to the deficits listed below (see PT Problem List). Pt will benefit from skilled PT to increase their independence and safety with mobility to allow discharge to the venue listed below.       Follow Up Recommendations SNF    Equipment Recommendations  Other (comment)(defer to next venue)    Recommendations for Other Services       Precautions / Restrictions Precautions Precautions: Fall;Other (comment) Precaution Comments: dementia Restrictions Weight Bearing Restrictions: No      Mobility  Bed Mobility Overal bed mobility: Needs Assistance Bed Mobility: Supine to Sit     Supine to sit: +2 for physical assistance;Total assist     General bed mobility comments: helicopter method using bed pad for transition to EOB  Transfers Overall transfer level: Needs assistance   Transfers: Squat Pivot Transfers     Squat pivot transfers: +2 physical assistance;Total assist     General transfer comment: Attempted sit to stand +2 total assist. Pt unable to achieve full upright stance. Squat pivot transfer using bed pad to lift/support bottom.  Ambulation/Gait             General Gait Details: unable  Stairs            Wheelchair Mobility    Modified Rankin (Stroke Patients Only)       Balance Overall balance assessment: Needs assistance Sitting-balance support: Feet unsupported;Bilateral upper extremity supported Sitting balance-Leahy Scale: Fair Sitting  balance - Comments: Pt sat EOB min/min guard assist.     Standing balance-Leahy Scale: Zero Standing balance comment: total assist for standing/transfer                             Pertinent Vitals/Pain Pain Assessment: Faces Pain Score: 5  Faces Pain Scale: Hurts little more Pain Location: L hip, feet Pain Descriptors / Indicators: Discomfort;Grimacing;Sore Pain Intervention(s): Repositioned;Limited activity within patient's tolerance;Monitored during session;Patient requesting pain meds-RN notified    Home Living Family/patient expects to be discharged to:: Skilled nursing facility                 Additional Comments: Unsure of PLOF: pt unable to expand    Prior Function Level of Independence: Needs assistance   Gait / Transfers Assistance Needed: Appears flexed- too stiff for mobility  ADL's / Homemaking Assistance Needed: grooming- set-up A; otherwise requires assist  Comments: SNF resident since previous admission (Feb 2020)     Hand Dominance   Dominant Hand: Right    Extremity/Trunk Assessment   Upper Extremity Assessment Upper Extremity Assessment: Defer to OT evaluation RUE Deficits / Details: AROM, WFLs  LUE Deficits / Details: Swelling noted, elevated on pillow    Lower Extremity Assessment Lower Extremity Assessment: Generalized weakness;RLE deficits/detail;LLE deficits/detail RLE Deficits / Details: stiff, limited ROM due to pain LLE Deficits / Details: stiff, limited ROM due to pain    Cervical / Trunk Assessment Cervical / Trunk Assessment: Kyphotic  Communication  Communication: HOH  Cognition Arousal/Alertness: Awake/alert Behavior During Therapy: Flat affect Overall Cognitive Status: No family/caregiver present to determine baseline cognitive functioning                                 General Comments: dementia at baseline. Following simple commands. Very soft voice and not expanding on any topic.       General Comments General comments (skin integrity, edema, etc.): Pt on 4L O2 with SpO2 >90% during mobility. 100% sitting in recliner at end of session.    Exercises     Assessment/Plan    PT Assessment Patient needs continued PT services  PT Problem List Decreased strength;Decreased mobility;Decreased range of motion;Decreased activity tolerance;Cardiopulmonary status limiting activity;Pain;Decreased balance       PT Treatment Interventions Therapeutic activities;Cognitive remediation;Gait training;Therapeutic exercise;Patient/family education;Balance training;Functional mobility training    PT Goals (Current goals can be found in the Care Plan section)  Acute Rehab PT Goals Patient Stated Goal: not stated PT Goal Formulation: With patient Time For Goal Achievement: 10/29/19 Potential to Achieve Goals: Fair    Frequency Min 2X/week   Barriers to discharge        Co-evaluation               AM-PAC PT "6 Clicks" Mobility  Outcome Measure Help needed turning from your back to your side while in a flat bed without using bedrails?: A Lot Help needed moving from lying on your back to sitting on the side of a flat bed without using bedrails?: Total Help needed moving to and from a bed to a chair (including a wheelchair)?: Total Help needed standing up from a chair using your arms (e.g., wheelchair or bedside chair)?: Total Help needed to walk in hospital room?: Total Help needed climbing 3-5 steps with a railing? : Total 6 Click Score: 7    End of Session Equipment Utilized During Treatment: Oxygen;Gait belt Activity Tolerance: Patient tolerated treatment well Patient left: in chair;with call bell/phone within reach;with chair alarm set Nurse Communication: Mobility status PT Visit Diagnosis: Other abnormalities of gait and mobility (R26.89);Pain;Muscle weakness (generalized) (M62.81) Pain - Right/Left: Left Pain - part of body: Hip    Time: [id number] PT Time  Calculation (min) (ACUTE ONLY): 24 min   Charges:   PT Evaluation $PT Eval Moderate Complexity: 1 Mod          Lorrin Goodell, PT  Office # [phone] Pager [phone]   Lorriane Shire 10/15/2019, 10:21 AM

## 2019-10-16 ENCOUNTER — Inpatient Hospital Stay (HOSPITAL_COMMUNITY): Payer: Medicare Other

## 2019-10-16 DIAGNOSIS — I421 Obstructive hypertrophic cardiomyopathy: Secondary | ICD-10-CM

## 2019-10-16 DIAGNOSIS — I5020 Unspecified systolic (congestive) heart failure: Secondary | ICD-10-CM

## 2019-10-16 LAB — CBC
HCT: 36 % (ref 36.0–46.0)
Hemoglobin: 12.1 g/dL (ref 12.0–15.0)
MCH: 32.4 pg (ref 26.0–34.0)
MCHC: 33.6 g/dL (ref 30.0–36.0)
MCV: 96.3 fL (ref 80.0–100.0)
Platelets: 172 10*3/uL (ref 150–400)
RBC: 3.74 MIL/uL — ABNORMAL LOW (ref 3.87–5.11)
RDW: 13.2 % (ref 11.5–15.5)
WBC: 8.3 10*3/uL (ref 4.0–10.5)
nRBC: 0 % (ref 0.0–0.2)

## 2019-10-16 LAB — BASIC METABOLIC PANEL
Anion gap: 9 (ref 5–15)
BUN: 20 mg/dL (ref 8–23)
CO2: 26 mmol/L (ref 22–32)
Calcium: 8.7 mg/dL — ABNORMAL LOW (ref 8.9–10.3)
Chloride: 105 mmol/L (ref 98–111)
Creatinine, Ser: 0.67 mg/dL (ref 0.44–1.00)
GFR calc Af Amer: >60 mL/min (ref >60)
GFR calc non Af Amer: >60 mL/min (ref >60)
Glucose, Bld: 101 mg/dL — ABNORMAL HIGH (ref 70–99)
Potassium: 3.4 mmol/L — ABNORMAL LOW (ref 3.5–5.1)
Sodium: 140 mmol/L (ref 135–145)

## 2019-10-16 LAB — CULTURE, BLOOD (ROUTINE X 2)
Culture: NO GROWTH
Culture: NO GROWTH
Special Requests: ADEQUATE
Special Requests: ADEQUATE

## 2019-10-16 LAB — GLUCOSE, CAPILLARY
Glucose-Capillary: 102 mg/dL — ABNORMAL HIGH (ref 70–99)
Glucose-Capillary: 74 mg/dL (ref 70–99)
Glucose-Capillary: 80 mg/dL (ref 70–99)
Glucose-Capillary: 89 mg/dL (ref 70–99)
Glucose-Capillary: 95 mg/dL (ref 70–99)
Glucose-Capillary: 96 mg/dL (ref 70–99)
Glucose-Capillary: 99 mg/dL (ref 70–99)

## 2019-10-16 LAB — HEPARIN LEVEL (UNFRACTIONATED): Heparin Unfractionated: 0.43 IU/mL (ref 0.30–0.70)

## 2019-10-16 MED ORDER — POTASSIUM CHLORIDE CRYS ER 20 MEQ PO TBCR: 40.0000 meq | EXTENDED_RELEASE_TABLET | Freq: Once | ORAL | Status: DC

## 2019-10-16 MED ORDER — RESOURCE THICKENUP CLEAR PO POWD
ORAL | Status: DC | PRN
  Filled 2019-10-16: qty 125

## 2019-10-16 MED ORDER — LOSARTAN POTASSIUM 25 MG PO TABS
12.5000 mg | ORAL_TABLET | Freq: Every day | ORAL | Status: DC
  Administered 2019-10-17 – 2019-10-25 (×9): 12.5 mg via ORAL
  Filled 2019-10-16: qty 0.5
  Filled 2019-10-16: qty 1
  Filled 2019-10-16: qty 0.5
  Filled 2019-10-16 (×7): qty 1

## 2019-10-16 NOTE — Progress Notes (Signed)
  Speech Language Pathology Treatment: Dysphagia  Patient Details Name: Hannah Galvan MRN: [id number] DOB: [birth year] Today's Date: 10/16/2019 Time: [id number] SLP Time Calculation (min) (ACUTE ONLY): 11 min  Assessment / Plan / Recommendation Clinical Impression  Pt was encountered awake/alert lying semi-reclined in bed, and she was agreeable to ST treatment session.  RN reported that pt consumed medications crushed in puree this AM without difficulty or coughing/choking.  Pt was observed with trials of ice chips, thin liquid, and puree.  She exhibited god bolus acceptance and mildly prolonged AP transport with all trials.  She presented with a delayed throat clear following 1/6 thin liquid trials; otherwise no clinical s/sx of aspiration were observed.  Recommend MBS to further evaluate swallow function and to help determine the safest/least restrictive diet.     HPI HPI: 85 year old female who presented from outside skilled nursing facility with acute respiratory distress, COVID-19 negative on admission.  Patient suffered worsening respiratory distress in emergency department and was subsequently intubated. Pt was intubated from 11/17-11/20. Pt with PMH of dementia, HTN, SOB.  BSE completed on 01/10/19 with recommendations for Dysphagia 3 (soft) solids and thin liquid.        SLP Plan  MBS       Recommendations  Diet recommendations: NPO(until MBS is completed ) Medication Administration: Crushed with puree                Oral Care Recommendations: Oral care QID;Staff/trained caregiver to provide oral care Follow up Recommendations: Skilled Nursing facility;24 hour supervision/assistance SLP Visit Diagnosis: Dysphagia, unspecified (R13.10) Plan: MBS       GO               Colin Mulders., M.S., [place] Acute Rehabilitation Services Office: [phone]  [place]le 10/16/2019, 11:17 AM

## 2019-10-16 NOTE — Progress Notes (Signed)
Modified Barium Swallow Progress Note  Patient Details  Name: Hannah Galvan MRN: [id number] Date of Birth: 30-Apr-1931  Today's Date: 10/16/2019  Modified Barium Swallow completed.  Full report located under Chart Review in the Imaging Section.  Brief recommendations include the following:  Clinical Impression  Pt presents with moderate oropharyngeal dysphagia with resultant aspiration of thin liquid on today's examination.   Aspiration was sensed intermittently; however, she was unable to completely clear aspirated material with a reflexive or cued cough. Pt additionally exhibited laryngeal penetration of nectar-thick liquid via cup sip x1.  No laryngeal penetration or aspiration was observed with thin liquid via tsp, nectar-thick liquid via straw, honey-thick liquid, puree, or regular solids.  Oral phase was remarkable for reduced lingual control resulting in premature spillage to the pharynx, reduced lingual strength resulting in trace oral residue, and reduced lingual coordination, resulting in repetitive lingual movements.  Pharyngeal phase was remarkable for reduced BOT retraction and reduced hyolaryngeal excursion resulting in vallecular residue.  Pt additionally exhibited reduced laryngeal closure resulting in laryngeal penetration and aspiration.  Swallow initiation was at the level of the pyriform sinuses with thin and nectar-thick liquid and at the level of the valleculae with honey-thick liquid, puree, and regular solids.  Pharyngoesophageal phase was unremarkable.  Recommend initiation of Dysphagia 1 (puree) solids and nectar-thick liquid with the following precautions: 1) Small bites/sips 2) Slow rate of intake 3) Sit upright 90 degrees.     Swallow Evaluation Recommendations       SLP Diet Recommendations: Dysphagia 1 (Puree) solids;Nectar thick liquid   Liquid Administration via: Cup;Straw   Medication Administration: Whole meds with puree   Supervision: Staff to assist with  self feeding;Full supervision/cueing for compensatory strategies   Compensations: Slow rate;Small sips/bites   Postural Changes: Seated upright at 90 degrees   Oral Care Recommendations: Oral care BID   Other Recommendations: Order thickener from [place]., M.S., [place] Office: [phone]  Elvia Collum Jernee Murtaugh 10/16/2019,1:55 PM

## 2019-10-16 NOTE — Progress Notes (Signed)
[facility] for Heparin Indication: chest pain/ACS  Allergies  Allergen Reactions  . Ambien [Zolpidem]     confusion  . Pyridium [Phenazopyridine Hcl] Other (See Comments)    hemolysis  . Sulfa Antibiotics Rash    Patient Measurements: Height: 5' 2.5" (158.8 cm) Weight: 152 lb 1.9 oz (69 kg) IBW/kg (Calculated) : 51.25 Heparin Dosing Weight: 64kg  Vital Signs: Temp: 99.6 F (37.6 C) (11/22 0406) Temp Source: Oral (11/22 0406) BP: 121/85 (11/22 0406) Pulse Rate: 86 (11/22 0406)  Labs: Recent Labs    10/14/19 0456 10/14/19 1714 10/15/19 0330 10/16/19 0520  HGB 11.2*  --  12.2 12.1  HCT 33.7*  --  36.3 36.0  PLT 161  --  157 172  HEPARINUNFRC 0.38  --  0.45 0.43  CREATININE 0.61 0.81 0.72 0.67    Estimated Creatinine Clearance: 46.5 mL/min (by C-G formula based on SCr of 0.67 mg/dL).   Medical History: Past Medical History:  Diagnosis Date  . Allergy   . Anxiety   . Arthritis   . Blood transfusion without reported diagnosis   . Breast cancer ([place])    breast, s/p RXT, bilateral  . Cataract   . CKD (chronic kidney disease), stage III   . Delirium   . Dementia ([place])   . History of echocardiogram    a. Echo 8/16: Vigorous LVF, EF 51-88%, grade 1 diastolic dysfunction, trivial AI, mild MR, mild RVE, normal RV function, PASP 33 mmHg // b. Echo 1/17 EF 65-70%, normal wall motion, grade 1 diastolic dysfunction, trivial AI, MAC, mild MR // c. Echo 11/26/16: mild LVH, EF 60-65, mild AI, mild MR, mild LAE, mild to mod TR  . History of stress test    Lexiscan Myoview 8/16:  EF 81%, breast attenuation, no ischemia; Low Risk // Myoview 11/26/16: EF 75, apical defect c/w soft tissue attenuation, no ischemia or scar; Low Risk  . HOCM (hypertrophic obstructive cardiomyopathy) ([place])   . Hyperlipidemia   . Hypertension   . Hypothyroidism   . Personal history of radiation therapy   . Premature atrial contractions    Assessment: Hannah Galvan  presenting from SNF in acute respiratory distress 2/2 pna, increasing troponin 960>1191, and concern for ACS.  Not on anticoagulation PTA. Pharmacy consulted to dose IV heparin therapy.   Heparin level is therapeutic 0.43 on 850 units/hr.  Hemoglobin/Hematocrit and plts stable.  No bleeding or issues with infusion noted.  Day #6 of IV heparin drip - no plan for cardiac intervention at this time per Cardiology note on 11/18.   Goal of Therapy:  Heparin level 0.3-0.7 units/ml Monitor platelets by anticoagulation protocol: Yes   Plan:  Continue heparin at 850 units/hr Monitor daily heparin level and CBC while on therapy.  Will re-address length of therapy with Cardiology.   Acey Lav, PharmD  PGY1 Acute Care Pharmacy Resident [phone] 10/16/2019, 8:41 AM

## 2019-10-16 NOTE — TOC Initial Note (Signed)
Transition of Care [facility]) - Initial/Assessment Note    Patient Details  Name: Hannah Galvan MRN: [id number] Date of Birth: 13-Dec-1930  Transition of Care [facility]) CM/SW Contact:    Gelene Mink, [place] Phone Number: 10/16/2019, 2:17 PM  Clinical Narrative:                  Patient is from [employer]. CSW called and spoke with the patient's son, Vernetha Querry.   CSW introduced herself and explained her role. CSW shared therapy recommendation. He is agreeable to SNF and his first two preferences are [facility] or [facility]. He stated she was at [facility] earlier this year and he liked the care she received. He stated his uncle is at [facility] and the patient would be near her brother.   CSW obtained permission to fax the patient out and provide bed offers. CSW provided CMS SNF list to the patient's son by e-mail. CSW will continue to follow and assist with discharge planning.    Expected Discharge Plan: Skilled Nursing Facility Barriers to Discharge: Insurance Authorization, Continued Medical Work up   Patient Goals and CMS Choice Patient states their goals for this hospitalization and ongoing recovery are:: Pt son wants his mother to get better CMS Medicare.gov Compare Post Acute Care list provided to:: Patient Represenative (must comment) Choice offered to / list presented to : Adult Children  Expected Discharge Plan and Services Expected Discharge Plan: [place] In-house Referral: Clinical Social Work Discharge Planning Services: NA Post Acute Care Choice: [place] Living arrangements for the past 2 months: [place]                 DME Arranged: N/A         HH Arranged: NA [place] Agency: NA        Prior Living Arrangements/Services Living arrangements for the past 2 months: [place] Lives with:: Facility Resident Patient language and need for interpreter reviewed:: No Do you feel safe going back to the  place where you live?: No   Pt needs rehab before returning back to ALF  Need for Family Participation in Patient Care: Yes (Comment) Care giver support system in place?: Yes (comment)   Criminal Activity/Legal Involvement Pertinent to Current Situation/Hospitalization: No - Comment as needed  Activities of Daily Living Home Assistive Devices/Equipment: None ADL Screening (condition at time of admission) Patient's cognitive ability adequate to safely complete daily activities?: Yes Is the patient deaf or have difficulty hearing?: Yes Does the patient have difficulty seeing, even when wearing glasses/contacts?: No Does the patient have difficulty concentrating, remembering, or making decisions?: Yes Patient able to express need for assistance with ADLs?: Yes Does the patient have difficulty dressing or bathing?: Yes Independently performs ADLs?: No Communication: Appropriate for developmental age Dressing (OT): Needs assistance Is this a change from baseline?: Pre-admission baseline Grooming: Needs assistance Is this a change from baseline?: Pre-admission baseline Feeding: Needs assistance Is this a change from baseline?: Pre-admission baseline Bathing: Needs assistance Is this a change from baseline?: Pre-admission baseline Toileting: Needs assistance Is this a change from baseline?: Pre-admission baseline In/Out Bed: Needs assistance Is this a change from baseline?: Pre-admission baseline Walks in Home: Needs assistance Is this a change from baseline?: Pre-admission baseline Does the patient have difficulty walking or climbing stairs?: Yes Weakness of Legs: Both Weakness of Arms/Hands: Both  Permission Sought/Granted Permission sought to share information with : Case Manager  Permission granted to share info w AGENCY: All SNF        Emotional Assessment Appearance:: Appears stated age Attitude/Demeanor/Rapport: Unable to Assess Affect (typically observed): Unable  to Assess Orientation: : Oriented to Self Alcohol / Substance Use: Not Applicable Psych Involvement: No (comment)  Admission diagnosis:  Hypokalemia [E87.6] Encounter for intubation [Z01.818] Acute respiratory failure with hypoxia ([place]) [J96.01] Multifocal pneumonia [J18.9] Suspected COVID-19 virus infection [Z20.828] Patient Active Problem List   Diagnosis Date Noted  . Heart failure with reduced ejection fraction ([place]) 10/16/2019  . Acute respiratory failure with hypoxia ([place])   . Multifocal pneumonia 10/11/2019  . Sepsis ([place]) 01/08/2019  . Acute metabolic encephalopathy [id number]  . Fall at home, initial encounter 03/02/2018  . Head contusion 03/02/2018  . HOCM (hypertrophic obstructive cardiomyopathy) ([place])   . Alzheimer's dementia without behavioral disturbance ([place]) 01/27/2017  . Chest pain 11/17/2015  . Dyspnea 12/06/2014  . Anxiety and depression 09/03/2014  . CN (constipation) 09/03/2014  . Essential hypertension 08/28/2014  . Status post left knee replacement 08/28/2014  . Acute delirium 06/05/2013  . Hyponatremia 06/05/2013  . Acute urinary retention 06/05/2013  . Hypokalemia 06/05/2013  . Fever, unspecified 06/05/2013  . Hypothyroidism 06/05/2013  . Left knee DJD 06/02/2013    Class: Chronic  . UTI (lower urinary tract infection) 08/31/2012  . Heart murmur, aortic 07/15/2012  . Dizziness - giddy 07/15/2012  . Gait disturbance 12/21/2011  . Osteopenia 12/21/2011  . Cholelithiasis with cholecystitis without obstruction 12/21/2011  . Liver hemangioma 12/21/2011  . Pulmonary nodule, right 12/21/2011  . Breast cancer ([place]) 12/03/2011  . Edema of both legs 09/15/2011  . Thyroid disease   . Hyperlipidemia   . Arthritis   . Cancer [facility])    PCP:  Susy Frizzle, MD Pharmacy:  No Pharmacies Listed    Social Determinants of Health (SDOH) Interventions    Readmission Risk Interventions No flowsheet data found.

## 2019-10-16 NOTE — NC FL2 (Signed)
[place] MEDICAID FL2 LEVEL OF CARE SCREENING TOOL     IDENTIFICATION  Patient Name: Hannah Galvan Birthdate: December 09, 1930 Sex: female Admission Date (Current Location): 10/11/2019  [facility] and [place] Number:  Herbalist and Address:  [facility] [place], [place] [address] [place], [place] [address]      Provider Number: [id number]  Attending Physician Name and Address:  Arlan Organ, DO  Relative Name and Phone Number:  Brailynn, [place], [phone]    Current Level of Care: Hospital Recommended Level of Care: [place] Prior Approval Number:    Date Approved/Denied:   PASRR Number: [id number]  Discharge Plan: SNF    Current Diagnoses: Patient Active Problem List   Diagnosis Date Noted  . Heart failure with reduced ejection fraction ([place]) 10/16/2019  . Acute respiratory failure with hypoxia ([place])   . Multifocal pneumonia 10/11/2019  . Sepsis ([place]) 01/08/2019  . Acute metabolic encephalopathy 40/34/7425  . Fall at home, initial encounter 03/02/2018  . Head contusion 03/02/2018  . HOCM (hypertrophic obstructive cardiomyopathy) ([place])   . Alzheimer's dementia without behavioral disturbance ([place]) 01/27/2017  . Chest pain 11/17/2015  . Dyspnea 12/06/2014  . Anxiety and depression 09/03/2014  . CN (constipation) 09/03/2014  . Essential hypertension 08/28/2014  . Status post left knee replacement 08/28/2014  . Acute delirium 06/05/2013  . Hyponatremia 06/05/2013  . Acute urinary retention 06/05/2013  . Hypokalemia 06/05/2013  . Fever, unspecified 06/05/2013  . Hypothyroidism 06/05/2013  . Left knee DJD 06/02/2013    Class: Chronic  . UTI (lower urinary tract infection) 08/31/2012  . Heart murmur, aortic 07/15/2012  . Dizziness - giddy 07/15/2012  . Gait disturbance 12/21/2011  . Osteopenia 12/21/2011  . Cholelithiasis with cholecystitis without obstruction 12/21/2011  . Liver hemangioma 12/21/2011  . Pulmonary nodule,  right 12/21/2011  . Breast cancer ([place]) 12/03/2011  . Edema of both legs 09/15/2011  . Thyroid disease   . Hyperlipidemia   . Arthritis   . Cancer ([place])     Orientation RESPIRATION BLADDER Height & Weight     Self  Normal Incontinent, External catheter Weight: 152 lb 1.9 oz (69 kg) Height:  5' 2.5" (158.8 cm)  BEHAVIORAL SYMPTOMS/MOOD NEUROLOGICAL BOWEL NUTRITION STATUS      Incontinent Diet(NPO)  AMBULATORY STATUS COMMUNICATION OF NEEDS Skin   Extensive Assist Verbally Normal                       Personal Care Assistance Level of Assistance  Bathing, Feeding, Dressing, Total care Bathing Assistance: Maximum assistance Feeding assistance: Limited assistance Dressing Assistance: Maximum assistance Total Care Assistance: Maximum assistance   Functional Limitations Info  Sight, Hearing, Speech Sight Info: Adequate Hearing Info: Adequate Speech Info: Adequate    SPECIAL CARE FACTORS FREQUENCY  PT (By licensed PT), OT (By licensed OT), Speech therapy     PT Frequency: 5x/wk OT Frequency: 5x/wk     Speech Therapy Frequency: 2x/wk      Contractures Contractures Info: Not present    Additional Factors Info  Code Status, Allergies, Psychotropic Code Status Info: DNR Allergies Info: Ambien (Zolpidem), Pyridium (Phenazopyridine Hcl), Sulfa Antibiotics Psychotropic Info: sertraline '100mg'$  daily         Current Medications (10/16/2019):  This is the current hospital active medication list Current Facility-Administered Medications  Medication Dose Route Frequency Provider Last Rate Last Dose  . 0.9 %  sodium chloride infusion   Intravenous PRN Collier Bullock, MD 10  mL/hr at 10/12/19 0000    . acetaminophen (TYLENOL) tablet 650 mg  650 mg Oral Q6H PRN Margaretha Seeds, MD   650 mg at 10/15/19 1446  . ARIPiprazole (ABILIFY) tablet 2 mg  2 mg Per Tube Daily Margaretha Seeds, MD   2 mg at 10/16/19 0929  . chlorhexidine (PERIDEX) 0.12 % solution 15 mL  15 mL Mouth  Rinse BID Margaretha Seeds, MD   15 mL at 10/16/19 0927  . chlorhexidine gluconate (MEDLINE KIT) (PERIDEX) 0.12 % solution 15 mL  15 mL Mouth Rinse BID Brand Males, MD   15 mL at 10/15/19 2135  . Chlorhexidine Gluconate Cloth 2 % PADS 6 each  6 each Topical Daily Corey Harold, NP   6 each at 10/15/19 0935  . levothyroxine (SYNTHROID) tablet 125 mcg  125 mcg Per Tube [id number] Rush Farmer, MD   125 mcg at 10/16/19 0543  . LORazepam (ATIVAN) tablet 0.5 mg  0.5 mg Oral QHS PRN Cristal Generous, NP   0.5 mg at 10/15/19 2134  . MEDLINE mouth rinse  15 mL Mouth Rinse q12n4p Margaretha Seeds, MD   15 mL at 10/15/19 1531  . oxyCODONE (Oxy IR/ROXICODONE) immediate release tablet 5 mg  5 mg Oral Q6H PRN Margaretha Seeds, MD   5 mg at 10/16/19 0930  . pantoprazole sodium (PROTONIX) 40 mg/20 mL oral suspension 40 mg  40 mg Per Tube Daily Rush Farmer, MD   40 mg at 10/16/19 0929  . Resource ThickenUp Clear   Oral PRN Arlan Organ, DO      . sertraline (ZOLOFT) tablet 100 mg  100 mg Per Tube Daily Margaretha Seeds, MD   100 mg at 10/16/19 8329     Discharge Medications: Please see discharge summary for a list of discharge medications.  Relevant Imaging Results:  Relevant Lab Results:   Additional Information SSN: [id number]; COVID negative on 11/17  Christropher Gintz B Lyndle Pang, LCSWA

## 2019-10-16 NOTE — Progress Notes (Addendum)
PROGRESS NOTE    Hannah Galvan  YKD:[id number] DOB: Jan 24, 1931 DOA: 10/11/2019 PCP: Susy Frizzle, MD   Brief Narrative:  85 year old female with extensive past medical history includingbut not limited to hypertrophic obstructive cardiomyopathy andadvanced dementia who to the emergency department via EMS from local skilled nursing facility in acuterespiratory distress. According to chart review patient was found on a nightly rounds with increased respiratory distress and work of breathing with an oxygen saturation of 60% on room air.Chest x-ray with signs of early opacities concerning for pulmonary edema versus multifocal infection. While in the emergency department due to concern for COVID-19 patient was removed from NIV therapy with eventual return of increased work of breathing and signs of respiratory fatigue prompting need for endotracheal intubation.  11/18 Patient remained intubated and found to have an EF of 25% from 55%. 11/19 Off vasopressors however remained intubated as she failed SBT 11/20 Diuresed well 11/21 Extubated. Remains on IV heparin for NSTEMI for medical management.   Assessment & Plan:   Active Problems:   Hypokalemia   Alzheimer's dementia without behavioral disturbance (HCC)   HOCM (hypertrophic obstructive cardiomyopathy) (HCC)   Multifocal pneumonia   Acute respiratory failure with hypoxia (HCC)   Heart failure with reduced ejection fraction (HCC)  Hypoxic respiratory failure -Patient has now been extubated and tolerating this well. -Likely secondary to multifocal pneumonia from aspiration. -Likely complicated by new onset heart failure with reduced ejection fraction. -Now on room air.  We will continue with supplemental O2 as needed. -Aspiration precautions.  Heart failure with reduced EF -This is acute as the patient's most recent echocardiogram prior to this admission showed an EF of 55% now down to 25%. -She has been successfully diuresed  and appears to be at an adequate target weight. -Continue strict I's and O's and daily weights.  Standing if possible. -Goal weight of 69 kg and can adjust this as needed. -Stopping IV heparin after speaking with cardiology - Will start low dose losartan  Multifocal pneumonia -Likely secondary to aspiration. -Yesterday was day 5 of antibiotics which included cefepime, vancomycin, and doxycycline.  No further antibiotics needed at this time. -White blood cell count down to 8.3.  Patient remains afebrile and on room air.  Alzheimer's dementia -Patient will eventually be discharged back to her nursing home once medically stable.  Hypokalemia -Potassium 3.4 likely due to diuresis. -We will replace potassium with 40 mEq p.o. one-time dose. -Repeat BMP in the morning.  Aspiration PNA - treated with abx as above - Speech therapy following - Barium swallow noted with recommendations of dysphagia 1 with nectar thick liquid with strict aspiration precautions. Also recommended to have 24 hour supervision/assistance while eating.   DVT prophylaxis: Heparin drip code Status: DNR Family Communication: Spoke to the patient's son over the phone disposition Plan: Anticipate discharge back to nursing home once medically stable.  Likely within the next 48 hours   Consultants:   Cardiology  Procedures:   TTE - EF 25%  Antimicrobials: Cefepime, vancomycin, doxycycline 11/17-11/21  Subjective: Patient is awake and alert this morning.  She remains intermittently confused however has no complaints this morning.  She is asking to get up to go to the bathroom.  She denies any current chest pain, shortness of breath, nausea, vomiting, fever, or chills.  Objective: Vitals:   10/15/19 1800 10/15/19 2030 10/16/19 0406 10/16/19 0935  BP: (!) 142/71 (!) 156/76 121/85 121/75  Pulse: 82  86 86  Resp:  18 16 18  Temp: 98.5 F (36.9 C) 98.3 F (36.8 C) 99.6 F (37.6 C) 98.8 F (37.1 C)  TempSrc:  Oral Oral Oral Oral  SpO2: 99% 97% 96% 98%  Weight:      Height:        Intake/Output Summary (Last 24 hours) at 10/16/2019 1211 Last data filed at 10/16/2019 0856 Gross per 24 hour  Intake 42.36 ml  Output 300 ml  Net -257.64 ml   Filed Weights   10/13/19 0352 10/14/19 0344 10/15/19 0500  Weight: 70.7 kg 71.2 kg 69 kg    Examination:  General exam: Appears calm and comfortable  Respiratory system: Clear to auscultation. Respiratory effort normal. Cardiovascular system: S1 & S2 heard, RRR.  3 out of 6 systolic murmur.  No JVD,  rubs, gallops or clicks.  Trace pedal edema. Gastrointestinal system: Abdomen is nondistended, soft and nontender. No organomegaly or masses felt. Normal bowel sounds heard. Central nervous system: Alert and oriented. No focal neurological deficits. Extremities: No cyanosis, trace edema Skin: No rashes, lesions or ulcers Psychiatry: Mood and affect appropriate although patient does have significant dementia    Data Reviewed: I have personally reviewed following labs and imaging studies  CBC: Recent Labs  Lab 10/11/19 0021  10/12/19 0231 10/13/19 0602 10/14/19 0456 10/15/19 0330 10/16/19 0520  WBC 18.6*   < > 15.9* 10.0 7.8 8.2 8.3  NEUTROABS 16.0*  --   --   --   --   --   --   HGB 16.3*   < > 12.1 10.8* 11.2* 12.2 12.1  HCT 49.6*   < > 36.6 33.4* 33.7* 36.3 36.0  MCV 96.7   < > 97.3 100.6* 95.7 95.8 96.3  PLT 185   < > 157 127* 161 157 172   < > = values in this interval not displayed.   Basic Metabolic Panel: Recent Labs  Lab 10/11/19 0429  10/13/19 0602 10/13/19 1642 10/14/19 0456 10/14/19 1714 10/15/19 0330 10/16/19 0520  NA  --    < > 141  --  140 140 139 140  K  --    < > 3.1*  --  3.1* 4.1 3.9 3.4*  CL  --   --  109  --  108 108 107 105  CO2  --   --  21*  --  22 20* 20* 26  GLUCOSE  --   --  89  --  114* 91 91 101*  BUN  --   --  24*  --  28* 29* 24* 20  CREATININE  --   --  0.73  --  0.61 0.81 0.72 0.67  CALCIUM  --    --  7.9*  --  8.3* 8.6* 8.8* 8.7*  MG 2.3  --  2.0 2.1 2.0 2.0  --   --   PHOS 3.2  --  2.1* 2.4* 2.7 2.8  --   --    < > = values in this interval not displayed.   GFR: Estimated Creatinine Clearance: 46.5 mL/min (by C-G formula based on SCr of 0.67 mg/dL). Liver Function Tests: Recent Labs  Lab 10/11/19 0049  AST 46*  ALT 22  ALKPHOS 38  BILITOT 1.1  PROT 5.3*  ALBUMIN 3.1*   No results for input(s): LIPASE, AMYLASE in the last 168 hours. No results for input(s): AMMONIA in the last 168 hours. Coagulation Profile: Recent Labs  Lab 10/11/19 0429  INR 1.3*   Cardiac Enzymes: No results for input(s): CKTOTAL,  CKMB, CKMBINDEX, TROPONINI in the last 168 hours. BNP (last 3 results) No results for input(s): PROBNP in the last 8760 hours. HbA1C: No results for input(s): HGBA1C in the last 72 hours. CBG: Recent Labs  Lab 10/15/19 1931 10/16/19 0005 10/16/19 0408 10/16/19 0721 10/16/19 1127  GLUCAP 89 74 96 99 95   Lipid Profile: Recent Labs    10/15/19 0330  TRIG 83   Thyroid Function Tests: No results for input(s): TSH, T4TOTAL, FREET4, T3FREE, THYROIDAB in the last 72 hours. Anemia Panel: No results for input(s): VITAMINB12, FOLATE, FERRITIN, TIBC, IRON, RETICCTPCT in the last 72 hours. Sepsis Labs: Recent Labs  Lab 10/11/19 0021 10/11/19 0246  PROCALCITON  --  0.15  LATICACIDVEN 3.0* 2.4*    Recent Results (from the past 240 hour(s))  Blood culture (routine x 2)     Status: None   Collection Time: 10/11/19 12:34 AM   Specimen: BLOOD  Result Value Ref Range Status   Specimen Description BLOOD LEFT ANTECUBITAL  Final   Special Requests   Final    BOTTLES DRAWN AEROBIC AND ANAEROBIC Blood Culture adequate volume   Culture   Final    NO GROWTH 5 DAYS Performed at [facility], [address] [place], [place] [address]    Report Status 10/16/2019 FINAL  Final  Blood culture (routine x 2)     Status: None   Collection Time: 10/11/19 12:39 AM    Specimen: BLOOD  Result Value Ref Range Status   Specimen Description BLOOD RIGHT ARM  Final   Special Requests   Final    BOTTLES DRAWN AEROBIC AND ANAEROBIC Blood Culture adequate volume   Culture   Final    NO GROWTH 5 DAYS Performed at [facility], [place] [address] [place], [place] [address]    Report Status 10/16/2019 FINAL  Final  SARS Coronavirus 2 by RT PCR (hospital order, performed in [facility] hospital lab) Nasopharyngeal Nasopharyngeal Swab     Status: None   Collection Time: 10/11/19 12:39 AM   Specimen: Nasopharyngeal Swab  Result Value Ref Range Status   SARS Coronavirus 2 NEGATIVE NEGATIVE Final    Comment: (NOTE) If result is NEGATIVE SARS-CoV-2 target nucleic acids are NOT DETECTED. The SARS-CoV-2 RNA is generally detectable in upper and lower  respiratory specimens during the acute phase of infection. The lowest  concentration of SARS-CoV-2 viral copies this assay can detect is 250  copies / mL. A negative result does not preclude SARS-CoV-2 infection  and should not be used as the sole basis for treatment or other  patient management decisions.  A negative result may occur with  improper specimen collection / handling, submission of specimen other  than nasopharyngeal swab, presence of viral mutation(s) within the  areas targeted by this assay, and inadequate number of viral copies  (<250 copies / mL). A negative result must be combined with clinical  observations, patient history, and epidemiological information. If result is POSITIVE SARS-CoV-2 target nucleic acids are DETECTED. The SARS-CoV-2 RNA is generally detectable in upper and lower  respiratory specimens dur ing the acute phase of infection.  Positive  results are indicative of active infection with SARS-CoV-2.  Clinical  correlation with patient history and other diagnostic information is  necessary to determine patient infection status.  Positive results do  not rule out bacterial infection  or co-infection with other viruses. If result is PRESUMPTIVE POSTIVE SARS-CoV-2 nucleic acids MAY BE PRESENT.   A presumptive positive  result was obtained on the submitted specimen  and confirmed on repeat testing.  While 2019 novel coronavirus  (SARS-CoV-2) nucleic acids may be present in the submitted sample  additional confirmatory testing may be necessary for epidemiological  and / or clinical management purposes  to differentiate between  SARS-CoV-2 and other Sarbecovirus currently known to infect humans.  If clinically indicated additional testing with an alternate test  methodology [phone]) is advised. The SARS-CoV-2 RNA is generally  detectable in upper and lower respiratory sp ecimens during the acute  phase of infection. The expected result is Negative. Fact Sheet for Patients:  StrictlyIdeas.no Fact Sheet for Healthcare Providers: BankingDealers.co.za This test is not yet approved or cleared by the [place] FDA and has been authorized for detection and/or diagnosis of SARS-CoV-2 by FDA under an Emergency Use Authorization (EUA).  This EUA will remain in effect (meaning this test can be used) for the duration of the COVID-19 declaration under Section 564(b)(1) of the Act, 21 U.S.C. section 360bbb-3(b)(1), unless the authorization is terminated or revoked sooner. Performed at [facility], [place] [address] [place], [place] [address]   Respiratory Panel by PCR     Status: None   Collection Time: 10/11/19  2:25 AM   Specimen: Nasopharyngeal Swab; Respiratory  Result Value Ref Range Status   Adenovirus NOT DETECTED NOT DETECTED Final   Coronavirus 229E NOT DETECTED NOT DETECTED Final    Comment: (NOTE) The Coronavirus on the Respiratory Panel, DOES NOT test for the novel  Coronavirus (2019 nCoV)    Coronavirus HKU1 NOT DETECTED NOT DETECTED Final   Coronavirus NL63 NOT DETECTED NOT DETECTED Final   Coronavirus OC43  NOT DETECTED NOT DETECTED Final   Metapneumovirus NOT DETECTED NOT DETECTED Final   Rhinovirus / Enterovirus NOT DETECTED NOT DETECTED Final   Influenza A NOT DETECTED NOT DETECTED Final   Influenza B NOT DETECTED NOT DETECTED Final   Parainfluenza Virus 1 NOT DETECTED NOT DETECTED Final   Parainfluenza Virus 2 NOT DETECTED NOT DETECTED Final   Parainfluenza Virus 3 NOT DETECTED NOT DETECTED Final   Parainfluenza Virus 4 NOT DETECTED NOT DETECTED Final   Respiratory Syncytial Virus NOT DETECTED NOT DETECTED Final   Bordetella pertussis NOT DETECTED NOT DETECTED Final   Chlamydophila pneumoniae NOT DETECTED NOT DETECTED Final   Mycoplasma pneumoniae NOT DETECTED NOT DETECTED Final    Comment: Performed at [facility], [place], [place], [place] [address]  MRSA PCR Screening     Status: None   Collection Time: 10/11/19  2:25 AM   Specimen: Nasopharyngeal  Result Value Ref Range Status   MRSA by PCR NEGATIVE NEGATIVE Final    Comment:        The GeneXpert MRSA Assay (FDA approved for NASAL specimens only), is one component of a comprehensive MRSA colonization surveillance program. It is not intended to diagnose MRSA infection nor to guide or monitor treatment for MRSA infections. Performed at [facility], [place] [address] [place], [place] [address]          Radiology Studies: No results found.      Scheduled Meds: . ARIPiprazole  2 mg Per Tube Daily  . chlorhexidine  15 mL Mouth Rinse BID  . chlorhexidine gluconate (MEDLINE KIT)  15 mL Mouth Rinse BID  . Chlorhexidine Gluconate Cloth  6 each Topical Daily  . levothyroxine  125 mcg Per Tube Q0600  . mouth rinse  15 mL Mouth Rinse q12n4p  .  pantoprazole sodium  40 mg Per Tube Daily  . sertraline  100 mg Per Tube Daily   Continuous Infusions: . sodium chloride 10 mL/hr at 10/12/19 0000  . heparin 850 Units/hr (10/16/19 1205)     LOS: 5 days   Arlan Organ, DO [facility]  If  7PM-7AM, please contact night-coverage [email or web address]

## 2019-10-17 DIAGNOSIS — I5020 Unspecified systolic (congestive) heart failure: Secondary | ICD-10-CM

## 2019-10-17 DIAGNOSIS — F0280 Dementia in other diseases classified elsewhere without behavioral disturbance: Secondary | ICD-10-CM

## 2019-10-17 DIAGNOSIS — G309 Alzheimer's disease, unspecified: Secondary | ICD-10-CM

## 2019-10-17 LAB — BASIC METABOLIC PANEL
Anion gap: 10 (ref 5–15)
BUN: 20 mg/dL (ref 8–23)
CO2: 24 mmol/L (ref 22–32)
Calcium: 8.6 mg/dL — ABNORMAL LOW (ref 8.9–10.3)
Chloride: 106 mmol/L (ref 98–111)
Creatinine, Ser: 0.63 mg/dL (ref 0.44–1.00)
GFR calc Af Amer: >60 mL/min (ref >60)
GFR calc non Af Amer: >60 mL/min (ref >60)
Glucose, Bld: 134 mg/dL — ABNORMAL HIGH (ref 70–99)
Potassium: 2.9 mmol/L — ABNORMAL LOW (ref 3.5–5.1)
Sodium: 140 mmol/L (ref 135–145)

## 2019-10-17 LAB — GLUCOSE, CAPILLARY
Glucose-Capillary: 104 mg/dL — ABNORMAL HIGH (ref 70–99)
Glucose-Capillary: 109 mg/dL — ABNORMAL HIGH (ref 70–99)
Glucose-Capillary: 113 mg/dL — ABNORMAL HIGH (ref 70–99)
Glucose-Capillary: 127 mg/dL — ABNORMAL HIGH (ref 70–99)
Glucose-Capillary: 78 mg/dL (ref 70–99)
Glucose-Capillary: 87 mg/dL (ref 70–99)

## 2019-10-17 MED ORDER — POTASSIUM CHLORIDE 10 MEQ/100ML IV SOLN
10.0000 meq | Freq: Once | INTRAVENOUS | Status: AC
  Administered 2019-10-17: 10 meq via INTRAVENOUS
  Filled 2019-10-17: qty 100

## 2019-10-17 MED ORDER — POTASSIUM CHLORIDE 10 MEQ/100ML IV SOLN
10.0000 meq | INTRAVENOUS | Status: AC
  Administered 2019-10-17 (×5): 10 meq via INTRAVENOUS
  Filled 2019-10-17 (×5): qty 100

## 2019-10-17 MED ORDER — ENOXAPARIN SODIUM 40 MG/0.4ML ~~LOC~~ SOLN
40.0000 mg | SUBCUTANEOUS | Status: DC
  Administered 2019-10-17 – 2019-10-23 (×7): 40 mg via SUBCUTANEOUS
  Filled 2019-10-17 (×7): qty 0.4

## 2019-10-17 MED ORDER — ASPIRIN EC 81 MG PO TBEC
81.0000 mg | DELAYED_RELEASE_TABLET | Freq: Every day | ORAL | Status: DC
  Administered 2019-10-17 – 2019-10-25 (×9): 81 mg via ORAL
  Filled 2019-10-17 (×10): qty 1

## 2019-10-17 NOTE — Progress Notes (Signed)
  Speech Language Pathology Treatment: Dysphagia  Patient Details Name: Hannah Galvan MRN: [id number] DOB: 04/19/1931 Today's Date: 10/17/2019 Time: [id number] SLP Time Calculation (min) (ACUTE ONLY): 16 min  Assessment / Plan / Recommendation Clinical Impression  Pt seen at bedside for follow up after MBS completed 10/16/2019. RN reports pt appears to tolerate Dys 1 diet with nectar thick liquids. Pt accepted trials of Dys1 and nectar thick liquid. Intermittent throat clear noted. Safe swallow precautions were posted at [facility]. SLP will continue to follow to assess diet tolerance and provide education.    HPI HPI:  85 year old female who presented from outside skilled nursing facility with acute respiratory distress, COVID-19 negative on admission.  Patient suffered worsening respiratory distress in emergency department and was subsequently intubated. Pt was intubated from 11/17-11/20. Pt with PMH of dementia, HTN, SOB.  BSE completed on 01/10/19 with recommendations for Dysphagia 3 (soft) solids and thin liquid.        SLP Plan  Continue with current plan of care       Recommendations  Diet recommendations: Dysphagia 1 (puree);Nectar-thick liquid Liquids provided via: Cup;Straw Medication Administration: Whole meds with puree Supervision: Staff to assist with self feeding;Full supervision/cueing for compensatory strategies;Patient able to self feed Compensations: Slow rate;Small sips/bites Postural Changes and/or Swallow Maneuvers: Seated upright 90 degrees                Oral Care Recommendations: Staff/trained caregiver to provide oral care;Oral care BID Follow up Recommendations: Skilled Nursing facility;24 hour supervision/assistance SLP Visit Diagnosis: Dysphagia, oropharyngeal phase (R13.12) Plan: Continue with current plan of care       [place], [facility], [place] Pathologist Office: [phone] Pager: [phone]  Shonna Chock 10/17/2019, 11:12 AM

## 2019-10-17 NOTE — Care Management Important Message (Signed)
Important Message  Patient Details  Name: Hannah Galvan MRN: [id number] Date of Birth: [birth year]   Medicare Important Message Given:  Yes     Orbie Pyo 10/17/2019, 3:08 PM

## 2019-10-17 NOTE — Progress Notes (Signed)
Nutrition Follow-up  DOCUMENTATION CODES:   Not applicable  INTERVENTION:    Magic cup TID with meals, each supplement provides 290 kcal and 9 grams of protein  NUTRITION DIAGNOSIS:   Inadequate oral intake related to inability to eat as evidenced by NPO status.  Resolved   GOAL:   Patient will meet greater than or equal to 90% of their needs   Progressing PO  MONITOR:   Vent status, Labs, TF tolerance, Skin  REASON FOR ASSESSMENT:   Consult Enteral/tube feeding initiation and management  ASSESSMENT:   85 yo female admitted with acute respiratory distress requiring intubation. COVID-19 negative. PMH includes dementia, hypothyroidism, HTN, HLD, breast cancer.   11/21- extubated   Diet advanced to DYS 1 with nectar thick liquids on 11/22. Per nursing pt tolerating current diet, however no meal completions charted. SLP continues to follow (on DYS 3 diet PTA). RD to send supplements to maximize kcal and protein. Expected d/c in 48 hrs.   Admission weight: 65.3 kg  Current weight: 69 kg   I/O: +978 ml since admit UOP: 875 ml x 24 hrs   Medications: reviewed  Labs: K 2.9 (L) CBG 78-104    Diet Order:   Diet Order            DIET - DYS 1 Room service appropriate? Yes with Assist; Fluid consistency: Nectar Thick  Diet effective now              EDUCATION NEEDS:   Not appropriate for education at this time  Skin:  Skin Assessment: Reviewed RN Assessment  Last BM:  11/21  Height:   Ht Readings from Last 1 Encounters:  10/11/19 5' 2.5" (1.588 m)    Weight:   Wt Readings from Last 1 Encounters:  10/15/19 69 kg   Admission weight 65.3 kg  Ideal Body Weight:  51.1 kg  BMI:  Body mass index is 27.38 kg/m.  Estimated Nutritional Needs:   Kcal:  1600-1800 kcal  Protein:  80-95 grams  Fluid:  >/= 1.6 L/day   Mariana Single RD, LDN Clinical Nutrition Pager # - [phone]

## 2019-10-17 NOTE — TOC Progression Note (Signed)
Transition of Care [facility]) - Progression Note    Patient Details  Name: Hannah Galvan MRN: [id number] Date of Birth: 08-17-1931  Transition of Care [facility] CM/SW Contact  Sharlet Salina Mila Homer, LCSW Phone Number: 10/17/2019, 5:07 PM  Clinical Narrative:  Visited room and talked with patient's son, Hannah Galvan regarding discharge disposition. Spoke with patient, and she responded and was quiet for the rest of CSW's visit. Son reported that she is more confused today, and asked him if she was still in the hospital. He added that she is still needing to be fed. Hannah Galvan chose [facility] for rehab. Son advised that he will be kept updated.   Call made to [facility] (4:51 pm) and message left for Hannah Galvan, admissions director regarding son wanting their facility for [place] rehab. CSW will initiate insurance auth with [facility] on 11/24.     Expected Discharge Plan: Skilled Nursing Facility Barriers to Discharge: Ship broker, Continued Medical Work up  Expected Discharge Plan and Services Expected Discharge Plan: [place] In-house Referral: Clinical Social Work Discharge Planning Services: NA Post Acute Care Choice: [place] Living arrangements for the past 2 months: [place]                 DME Arranged: N/A         HH Arranged: NA [place] Agency: NA         Social Determinants of Health (SDOH) Interventions  No SDOH interventions needed at this time.  Readmission Risk Interventions No flowsheet data found.

## 2019-10-17 NOTE — Progress Notes (Signed)
PROGRESS NOTE    Hannah Galvan  UTM:[id number] DOB: 05-28-1931 DOA: 10/11/2019 PCP: Susy Frizzle, MD   Brief Narrative:  85 year old female with extensive past medical history includingbut not limited to hypertrophic obstructive cardiomyopathy andadvanced dementia who to the emergency department via EMS from local skilled nursing facility in acuterespiratory distress. According to chart review patient was found on a nightly rounds with increased respiratory distress and work of breathing with an oxygen saturation of 60% on room air.Chest x-ray with signs of early opacities concerning for pulmonary edema versus multifocal infection. While in the emergency department due to concern for COVID-19 patient was removed from NIV therapy with eventual return of increased work of breathing and signs of respiratory fatigue prompting need for endotracheal intubation.  11/18 Patient remained intubated and found to have an EF of 25% from 55%. 11/19 Off vasopressors however remained intubated as she failed SBT 11/20 Diuresed well 11/21 Extubated.  There was some concern for NSTEMI-cardiology was consulted and they do not think patient is a candidate for further cardiology work-up.  Initially managed with IV heparin.  Subjective: Patient appears having dysarthria, very difficult to understand.  She was saying that she is having some pain but unable to answer where.  Assessment & Plan:   Active Problems:   Hypokalemia   Alzheimer's dementia without behavioral disturbance (HCC)   HOCM (hypertrophic obstructive cardiomyopathy) (HCC)   Multifocal pneumonia   Acute respiratory failure with hypoxia (HCC)   Heart failure with reduced ejection fraction (HCC)  HFrEF.  This is new finding as patient was having normal ejection fraction on prior echocardiograms.  No clinical volume overload at this time.  He was diuresed well.  Cardiology was consulted and there was some concern for NSTEMI.  She is not  a good candidate for further work-up and they advised medical management.  She was initially managed with IV heparin which was discontinued yesterday. -Continue low-dose losartan. -Start her on aspirin 81 mg daily. -Continue monitoring intake and output. -Prognosis seems poor, due to advanced dementia and recent NSTEMI resulted in severely reduced ejection fraction.  Hypoxic respiratory failure/aspiration pneumonia. Currently saturating well on room air.  It was thought to be secondary to acute on chronic heart failure and aspiration pneumonia.  Completed course of antibiotics yesterday. -Continue to monitor.  Hypokalemia.  Patient has worsening hypokalemia with potassium of 2.9 today. -Repeat potassium IV. -Check magnesium. -Repeat BMP in the morning.  Alzheimer's dementia.  She will be discharged back to memory unit. -Continue Abilify   objective: Vitals:   10/17/19 0610 10/17/19 0913 10/17/19 0913 10/17/19 1651  BP: 129/73 (!) 148/62 (!) 148/62 124/62  Pulse: 87 100 100 89  Resp: 20 (!) 24 (!) 24 (!) 24  Temp: (!) 101.2 F (38.4 C) 99.3 F (37.4 C) 99.3 F (37.4 C) 98 F (36.7 C)  TempSrc: Oral Oral Oral Oral  SpO2: 98% 98% 98% 93%  Weight:      Height:        Intake/Output Summary (Last 24 hours) at 10/17/2019 1741 Last data filed at 10/17/2019 1230 Gross per 24 hour  Intake 195 ml  Output 350 ml  Net -155 ml   Filed Weights   10/13/19 0352 10/14/19 0344 10/15/19 0500  Weight: 70.7 kg 71.2 kg 69 kg    Examination:  General exam: Chronically ill-appearing elderly lady, oriented to self only, not responding appropriately, hard of hearing, in no acute distress. Respiratory system: Clear to auscultation. Respiratory effort normal. Cardiovascular system:  S1 & S2 heard, RRR. No JVD, murmurs, rubs, gallops or clicks. No pedal edema. Gastrointestinal system: Abdomen is nondistended, soft and nontender. No organomegaly or masses felt. Normal bowel sounds heard. Central  nervous system: Alert and oriented. No focal neurological deficits. Extremities: Symmetric 5 x 5 power.  Pulses intact.   DVT prophylaxis: Lovenox Code Status: DNR Family Communication: No family at bedside Disposition Plan: Pending improvement, back to nursing home.Pending improvement, back to nursing home pending improvement back to nursing home.  Consultants:   Cardiology  Procedures:  Antimicrobials: Completed course of cefepime, vancomycin and doxycycline Cefepime, vancomycin, doxycycline 11/17-11/21  Data Reviewed: I have personally reviewed following labs and imaging studies  CBC: Recent Labs  Lab 10/11/19 0021  10/12/19 0231 10/13/19 0602 10/14/19 0456 10/15/19 0330 10/16/19 0520  WBC 18.6*   < > 15.9* 10.0 7.8 8.2 8.3  NEUTROABS 16.0*  --   --   --   --   --   --   HGB 16.3*   < > 12.1 10.8* 11.2* 12.2 12.1  HCT 49.6*   < > 36.6 33.4* 33.7* 36.3 36.0  MCV 96.7   < > 97.3 100.6* 95.7 95.8 96.3  PLT 185   < > 157 127* 161 157 172   < > = values in this interval not displayed.   Basic Metabolic Panel: Recent Labs  Lab 10/11/19 0429  10/13/19 0602 10/13/19 1642 10/14/19 0456 10/14/19 1714 10/15/19 0330 10/16/19 0520 10/17/19 0907  NA  --    < > 141  --  140 140 139 140 140  K  --    < > 3.1*  --  3.1* 4.1 3.9 3.4* 2.9*  CL  --   --  109  --  108 108 107 105 106  CO2  --   --  21*  --  22 20* 20* 26 24  GLUCOSE  --   --  89  --  114* 91 91 101* 134*  BUN  --   --  24*  --  28* 29* 24* 20 20  CREATININE  --   --  0.73  --  0.61 0.81 0.72 0.67 0.63  CALCIUM  --   --  7.9*  --  8.3* 8.6* 8.8* 8.7* 8.6*  MG 2.3  --  2.0 2.1 2.0 2.0  --   --   --   PHOS 3.2  --  2.1* 2.4* 2.7 2.8  --   --   --    < > = values in this interval not displayed.   GFR: Estimated Creatinine Clearance: 46.5 mL/min (by C-G formula based on SCr of 0.63 mg/dL). Liver Function Tests: Recent Labs  Lab 10/11/19 0049  AST 46*  ALT 22  ALKPHOS 38  BILITOT 1.1  PROT 5.3*  ALBUMIN  3.1*   No results for input(s): LIPASE, AMYLASE in the last 168 hours. No results for input(s): AMMONIA in the last 168 hours. Coagulation Profile: Recent Labs  Lab 10/11/19 0429  INR 1.3*   Cardiac Enzymes: No results for input(s): CKTOTAL, CKMB, CKMBINDEX, TROPONINI in the last 168 hours. BNP (last 3 results) No results for input(s): PROBNP in the last 8760 hours. HbA1C: No results for input(s): HGBA1C in the last 72 hours. CBG: Recent Labs  Lab 10/16/19 2356 10/17/19 0442 10/17/19 0738 10/17/19 1115 10/17/19 1652  GLUCAP 89 78 87 104* 109*   Lipid Profile: Recent Labs    10/15/19 0330  TRIG 83  Thyroid Function Tests: No results for input(s): TSH, T4TOTAL, FREET4, T3FREE, THYROIDAB in the last 72 hours. Anemia Panel: No results for input(s): VITAMINB12, FOLATE, FERRITIN, TIBC, IRON, RETICCTPCT in the last 72 hours. Sepsis Labs: Recent Labs  Lab 10/11/19 0021 10/11/19 0246  PROCALCITON  --  0.15  LATICACIDVEN 3.0* 2.4*    Recent Results (from the past 240 hour(s))  Blood culture (routine x 2)     Status: None   Collection Time: 10/11/19 12:34 AM   Specimen: BLOOD  Result Value Ref Range Status   Specimen Description BLOOD LEFT ANTECUBITAL  Final   Special Requests   Final    BOTTLES DRAWN AEROBIC AND ANAEROBIC Blood Culture adequate volume   Culture   Final    NO GROWTH 5 DAYS Performed at [facility], [address] [place], [place] [address]    Report Status 10/16/2019 FINAL  Final  Blood culture (routine x 2)     Status: None   Collection Time: 10/11/19 12:39 AM   Specimen: BLOOD  Result Value Ref Range Status   Specimen Description BLOOD RIGHT ARM  Final   Special Requests   Final    BOTTLES DRAWN AEROBIC AND ANAEROBIC Blood Culture adequate volume   Culture   Final    NO GROWTH 5 DAYS Performed at [facility], [place] [address], [place], [place] [address]    Report Status 10/16/2019 FINAL  Final  SARS Coronavirus 2 by RT PCR  (hospital order, performed in [facility] hospital lab) Nasopharyngeal Nasopharyngeal Swab     Status: None   Collection Time: 10/11/19 12:39 AM   Specimen: Nasopharyngeal Swab  Result Value Ref Range Status   SARS Coronavirus 2 NEGATIVE NEGATIVE Final    Comment: (NOTE) If result is NEGATIVE SARS-CoV-2 target nucleic acids are NOT DETECTED. The SARS-CoV-2 RNA is generally detectable in upper and lower  respiratory specimens during the acute phase of infection. The lowest  concentration of SARS-CoV-2 viral copies this assay can detect is 250  copies / mL. A negative result does not preclude SARS-CoV-2 infection  and should not be used as the sole basis for treatment or other  patient management decisions.  A negative result may occur with  improper specimen collection / handling, submission of specimen other  than nasopharyngeal swab, presence of viral mutation(s) within the  areas targeted by this assay, and inadequate number of viral copies  (<250 copies / mL). A negative result must be combined with clinical  observations, patient history, and epidemiological information. If result is POSITIVE SARS-CoV-2 target nucleic acids are DETECTED. The SARS-CoV-2 RNA is generally detectable in upper and lower  respiratory specimens dur ing the acute phase of infection.  Positive  results are indicative of active infection with SARS-CoV-2.  Clinical  correlation with patient history and other diagnostic information is  necessary to determine patient infection status.  Positive results do  not rule out bacterial infection or co-infection with other viruses. If result is PRESUMPTIVE POSTIVE SARS-CoV-2 nucleic acids MAY BE PRESENT.   A presumptive positive result was obtained on the submitted specimen  and confirmed on repeat testing.  While 2019 novel coronavirus  (SARS-CoV-2) nucleic acids may be present in the submitted sample  additional confirmatory testing may be necessary for  epidemiological  and / or clinical management purposes  to differentiate between  SARS-CoV-2 and other Sarbecovirus currently known to infect humans.  If clinically indicated additional testing with an alternate test  methodology (  [id number]) is advised. The SARS-CoV-2 RNA is generally  detectable in upper and lower respiratory sp ecimens during the acute  phase of infection. The expected result is Negative. Fact Sheet for Patients:  StrictlyIdeas.no Fact Sheet for Healthcare Providers: BankingDealers.co.za This test is not yet approved or cleared by the [place] FDA and has been authorized for detection and/or diagnosis of SARS-CoV-2 by FDA under an Emergency Use Authorization (EUA).  This EUA will remain in effect (meaning this test can be used) for the duration of the COVID-19 declaration under Section 564(b)(1) of the Act, 21 U.S.C. section 360bbb-3(b)(1), unless the authorization is terminated or revoked sooner. Performed at [facility], [place] [address], [place], [place] [address]   Respiratory Panel by PCR     Status: None   Collection Time: 10/11/19  2:25 AM   Specimen: Nasopharyngeal Swab; Respiratory  Result Value Ref Range Status   Adenovirus NOT DETECTED NOT DETECTED Final   Coronavirus 229E NOT DETECTED NOT DETECTED Final    Comment: (NOTE) The Coronavirus on the Respiratory Panel, DOES NOT test for the novel  Coronavirus (2019 nCoV)    Coronavirus HKU1 NOT DETECTED NOT DETECTED Final   Coronavirus NL63 NOT DETECTED NOT DETECTED Final   Coronavirus OC43 NOT DETECTED NOT DETECTED Final   Metapneumovirus NOT DETECTED NOT DETECTED Final   Rhinovirus / Enterovirus NOT DETECTED NOT DETECTED Final   Influenza A NOT DETECTED NOT DETECTED Final   Influenza B NOT DETECTED NOT DETECTED Final   Parainfluenza Virus 1 NOT DETECTED NOT DETECTED Final   Parainfluenza Virus 2 NOT DETECTED NOT DETECTED Final   Parainfluenza  Virus 3 NOT DETECTED NOT DETECTED Final   Parainfluenza Virus 4 NOT DETECTED NOT DETECTED Final   Respiratory Syncytial Virus NOT DETECTED NOT DETECTED Final   Bordetella pertussis NOT DETECTED NOT DETECTED Final   Chlamydophila pneumoniae NOT DETECTED NOT DETECTED Final   Mycoplasma pneumoniae NOT DETECTED NOT DETECTED Final    Comment: Performed at [facility], [place]. [address] [place], [place] [address]  MRSA PCR Screening     Status: None   Collection Time: 10/11/19  2:25 AM   Specimen: Nasopharyngeal  Result Value Ref Range Status   MRSA by PCR NEGATIVE NEGATIVE Final    Comment:        The GeneXpert MRSA Assay (FDA approved for NASAL specimens only), is one component of a comprehensive MRSA colonization surveillance program. It is not intended to diagnose MRSA infection nor to guide or monitor treatment for MRSA infections. Performed at [facility], [place] [address] [place], [place] [address]      Radiology Studies: Dg Swallowing Func-speech Pathology  Result Date: 10/16/2019 Objective Swallowing Evaluation: Type of Study: MBS-Modified Barium Swallow Study  Patient Details Name: JEZELLE GULLICK MRN: [id number] Date of Birth: August 29, 1931 Today's Date: 10/16/2019 Time: SLP Start Time (ACUTE ONLY): 48 -SLP Stop Time (ACUTE ONLY): 1300 SLP Time Calculation (min) (ACUTE ONLY): 25 min Past Medical History: Past Medical History: Diagnosis Date  Allergy   Anxiety   Arthritis   Blood transfusion without reported diagnosis   Breast cancer ([place])   breast, s/p RXT, bilateral  Cataract   CKD (chronic kidney disease), stage III   Delirium   Dementia ([place])   History of echocardiogram   a. Echo 8/16: Vigorous LVF, EF 56-38%, grade 1 diastolic dysfunction, trivial AI, mild MR, mild RVE, normal RV function, PASP 33 mmHg // b. Echo 1/17 EF 65-70%, normal wall  motion, grade 1 diastolic dysfunction, trivial AI, MAC, mild MR // c. Echo 11/26/16: mild LVH, EF 60-65, mild AI, mild MR,  mild LAE, mild to mod TR  History of stress test   Lexiscan Myoview 8/16:  EF 81%, breast attenuation, no ischemia; Low Risk // Myoview 11/26/16: EF 75, apical defect c/w soft tissue attenuation, no ischemia or scar; Low Risk  HOCM (hypertrophic obstructive cardiomyopathy) ([place])   Hyperlipidemia   Hypertension   Hypothyroidism   Personal history of radiation therapy   Premature atrial contractions  Past Surgical History: Past Surgical History: Procedure Laterality Date  ABDOMINAL HYSTERECTOMY  2008  bladder tact    BREAST LUMPECTOMY Right 2000  BREAST LUMPECTOMY Left 2007  CATARACT EXTRACTION  2011  JOINT REPLACEMENT    KNEE SURGERY    right  ROTATOR CUFF REPAIR    right  THYROID SURGERY  2002  TOTAL KNEE ARTHROPLASTY Left 06/02/2013  Dr Rhona Raider  TOTAL KNEE ARTHROPLASTY Right 06/02/2013  Procedure: RIGHT TOTAL KNEE ARTHROPLASTY;  Surgeon: Hessie Dibble, MD;  Location: [place];  Service: Orthopedics;  Laterality: Right;  TOTAL KNEE ARTHROPLASTY Left 08/22/2014  Procedure: TOTAL KNEE ARTHROPLASTY;  Surgeon: Hessie Dibble, MD;  Location: [place];  Service: Orthopedics;  Laterality: Left; HPI:  85 year old female who presented from outside skilled nursing facility with acute respiratory distress, COVID-19 negative on admission.  Patient suffered worsening respiratory distress in emergency department and was subsequently intubated. Pt was intubated from 11/17-11/20. Pt with PMH of dementia, HTN, SOB.  BSE completed on 01/10/19 with recommendations for Dysphagia 3 (soft) solids and thin liquid.   Subjective: Pt was awake/alert and pleasant. Assessment / Plan / Recommendation CHL IP CLINICAL IMPRESSIONS 10/16/2019 Clinical Impression Pt presents with moderate oropharyngeal dysphagia with resultant aspiration of thin liquid on today's examination.   Aspiration was sensed intermittently; however, she was unable to completely clear aspirated material with a reflexive or cued cough. Pt additionally exhibited  laryngeal penetration of nectar-thick liquid via cup sip x1.  No laryngeal penetration or aspiration was observed with thin liquid via tsp, nectar-thick liquid via straw, honey-thick liquid, puree, or regular solids.  Oral phase was remarkable for reduced lingual control resulting in premature spillage to the pharynx, reduced lingual strength resulting in trace oral residue, and reduced lingual coordination, resulting in repetitive lingual movements.  Pharyngeal phase was remarkable for reduced BOT retraction and reduced hyolaryngeal excursion resulting in vallecular residue.  Pt additionally exhibited reduced laryngeal closure resulting in laryngeal penetration and aspiration.  Swallow initiation was at the level of the pyriform sinuses with thin and nectar-thick liquid and at the level of the valleculae with honey-thick liquid, puree, and regular solids.  Pharyngoesophageal phase was unremarkable.  Recommend Dysphagia 1 (puree) solids and nectar-thick liquid with the following precautions: 1) Small bites/sips 2) Slow rate of intake 3) Sit upright 90 degrees. SLP Visit Diagnosis Dysphagia, oropharyngeal phase (R13.12) Attention and concentration deficit following -- Frontal lobe and executive function deficit following -- Impact on safety and function Mild aspiration risk;Moderate aspiration risk   CHL IP TREATMENT RECOMMENDATION 10/16/2019 Treatment Recommendations Therapy as outlined in treatment plan below   Prognosis 10/16/2019 Prognosis for Safe Diet Advancement Fair Barriers to Reach Goals -- Barriers/Prognosis Comment -- CHL IP DIET RECOMMENDATION 10/16/2019 SLP Diet Recommendations Dysphagia 1 (Puree) solids;Nectar thick liquid Liquid Administration via Cup;Straw Medication Administration Whole meds with puree Compensations Slow rate;Small sips/bites Postural Changes Seated upright at 90 degrees   CHL IP OTHER RECOMMENDATIONS 10/16/2019 Recommended Consults --  Oral Care Recommendations Oral care BID Other  Recommendations Order thickener from pharmacy   CHL IP FOLLOW UP RECOMMENDATIONS 10/16/2019 Follow up Recommendations Skilled Nursing facility;24 hour supervision/assistance   CHL IP FREQUENCY AND DURATION 10/16/2019 Speech Therapy Frequency (ACUTE ONLY) min 2x/week Treatment Duration 2 weeks      CHL IP ORAL PHASE 10/16/2019 Oral Phase Impaired Oral - Pudding Teaspoon -- Oral - Pudding Cup -- Oral - Honey Teaspoon -- Oral - Honey Cup Weak lingual manipulation;Decreased bolus cohesion;Premature spillage;Lingual/palatal residue Oral - Nectar Teaspoon -- Oral - Nectar Cup Premature spillage;Decreased bolus cohesion;Delayed oral transit;Weak lingual manipulation Oral - Nectar Straw Premature spillage;Decreased bolus cohesion;Delayed oral transit;Lingual/palatal residue Oral - Thin Teaspoon Premature spillage;Decreased bolus cohesion;Lingual/palatal residue Oral - Thin Cup Premature spillage;Decreased bolus cohesion;Piecemeal swallowing;Lingual pumping Oral - Thin Straw Lingual pumping;Decreased bolus cohesion;Premature spillage;Lingual/palatal residue Oral - Puree Decreased bolus cohesion;Premature spillage;Lingual/palatal residue;Piecemeal swallowing;Weak lingual manipulation;Lingual pumping Oral - Mech Soft -- Oral - Regular Delayed oral transit;Weak lingual manipulation;Lingual pumping;Impaired mastication;Piecemeal swallowing;Lingual/palatal residue;Decreased bolus cohesion Oral - Multi-Consistency -- Oral - Pill -- Oral Phase - Comment --  CHL IP PHARYNGEAL PHASE 10/16/2019 Pharyngeal Phase Impaired Pharyngeal- Pudding Teaspoon -- Pharyngeal -- Pharyngeal- Pudding Cup -- Pharyngeal -- Pharyngeal- Honey Teaspoon -- Pharyngeal -- Pharyngeal- Honey Cup Delayed swallow initiation-vallecula;Reduced anterior laryngeal mobility;Reduced tongue base retraction;Pharyngeal residue - valleculae Pharyngeal Material does not enter airway Pharyngeal- Nectar Teaspoon -- Pharyngeal -- Pharyngeal- Nectar Cup Pharyngeal residue -  valleculae;Penetration/Aspiration before swallow;Reduced tongue base retraction;Reduced airway/laryngeal closure;Reduced anterior laryngeal mobility;Delayed swallow initiation-pyriform sinuses Pharyngeal Material enters airway, remains ABOVE vocal cords then ejected out Pharyngeal- Nectar Straw Delayed swallow initiation-pyriform sinuses;Reduced anterior laryngeal mobility;Reduced tongue base retraction;Pharyngeal residue - valleculae Pharyngeal Material does not enter airway Pharyngeal- Thin Teaspoon Delayed swallow initiation-pyriform sinuses;Reduced anterior laryngeal mobility;Reduced tongue base retraction;Pharyngeal residue - valleculae Pharyngeal Material does not enter airway Pharyngeal- Thin Cup Delayed swallow initiation-pyriform sinuses;Reduced anterior laryngeal mobility;Reduced airway/laryngeal closure;Reduced tongue base retraction;Penetration/Aspiration before swallow;Penetration/Aspiration during swallow;Trace aspiration;Pharyngeal residue - valleculae Pharyngeal Material enters airway, passes BELOW cords and not ejected out despite cough attempt by patient Pharyngeal- Thin Straw Delayed swallow initiation-pyriform sinuses;Reduced anterior laryngeal mobility;Reduced airway/laryngeal closure;Reduced tongue base retraction;Penetration/Aspiration before swallow;Penetration/Aspiration during swallow;Trace aspiration;Pharyngeal residue - valleculae Pharyngeal Material enters airway, passes BELOW cords without attempt by patient to eject out (silent aspiration) Pharyngeal- Puree Delayed swallow initiation-vallecula;Reduced tongue base retraction;Reduced anterior laryngeal mobility;Pharyngeal residue - valleculae Pharyngeal Material does not enter airway Pharyngeal- Mechanical Soft -- Pharyngeal -- Pharyngeal- Regular Delayed swallow initiation-vallecula;Reduced tongue base retraction;Reduced anterior laryngeal mobility;Pharyngeal residue - valleculae Pharyngeal -- Pharyngeal- Multi-consistency -- Pharyngeal  -- Pharyngeal- Pill -- Pharyngeal -- Pharyngeal Comment --  CHL IP CERVICAL ESOPHAGEAL PHASE 10/16/2019 Cervical Esophageal Phase WFL Pudding Teaspoon -- Pudding Cup -- Honey Teaspoon -- Honey Cup -- Nectar Teaspoon -- Nectar Cup -- Nectar Straw -- Thin Teaspoon -- Thin Cup -- Thin Straw -- Puree -- Mechanical Soft -- Regular -- Multi-consistency -- Pill -- Cervical Esophageal Comment -- Colin Mulders M.S., CCC-SLP Acute Rehabilitation Services Office: [phone] Elvia Collum [facility] 10/16/2019, 1:58 PM               Scheduled Meds:  ARIPiprazole  2 mg Per Tube Daily   aspirin EC  81 mg Oral Daily   chlorhexidine  15 mL Mouth Rinse BID   chlorhexidine gluconate (MEDLINE KIT)  15 mL Mouth Rinse BID   Chlorhexidine Gluconate Cloth  6 each Topical Daily   levothyroxine  125 mcg Per Tube Q0600   losartan  12.5 mg Oral Daily   mouth rinse  15 mL Mouth Rinse q12n4p   pantoprazole sodium  40 mg Per Tube Daily   sertraline  100 mg Per Tube Daily   Continuous Infusions:  sodium chloride 10 mL/hr at 10/12/19 0000   potassium chloride       LOS: 6 days   Time spent: 45 minutes.  Lorella Nimrod, MD [facility] Pager [phone]  If 7PM-7AM, please contact night-coverage [email or web address] Password [facility] 10/17/2019, 5:41 PM   This record has been created using Dragon voice recognition software. Errors have been sought and corrected,but may not always be located. Such creation errors do not reflect on the standard of care.

## 2019-10-18 LAB — CBC
HCT: 37.4 % (ref 36.0–46.0)
Hemoglobin: 12.2 g/dL (ref 12.0–15.0)
MCH: 31.8 pg (ref 26.0–34.0)
MCHC: 32.6 g/dL (ref 30.0–36.0)
MCV: 97.4 fL (ref 80.0–100.0)
Platelets: 160 10*3/uL (ref 150–400)
RBC: 3.84 MIL/uL — ABNORMAL LOW (ref 3.87–5.11)
RDW: 13.4 % (ref 11.5–15.5)
WBC: 6.6 10*3/uL (ref 4.0–10.5)
nRBC: 0 % (ref 0.0–0.2)

## 2019-10-18 LAB — BASIC METABOLIC PANEL
Anion gap: 8 (ref 5–15)
BUN: 22 mg/dL (ref 8–23)
CO2: 22 mmol/L (ref 22–32)
Calcium: 8.3 mg/dL — ABNORMAL LOW (ref 8.9–10.3)
Chloride: 109 mmol/L (ref 98–111)
Creatinine, Ser: 0.72 mg/dL (ref 0.44–1.00)
GFR calc Af Amer: >60 mL/min (ref >60)
GFR calc non Af Amer: >60 mL/min (ref >60)
Glucose, Bld: 142 mg/dL — ABNORMAL HIGH (ref 70–99)
Potassium: 4.3 mmol/L (ref 3.5–5.1)
Sodium: 139 mmol/L (ref 135–145)

## 2019-10-18 LAB — GLUCOSE, CAPILLARY
Glucose-Capillary: 114 mg/dL — ABNORMAL HIGH (ref 70–99)
Glucose-Capillary: 123 mg/dL — ABNORMAL HIGH (ref 70–99)
Glucose-Capillary: 141 mg/dL — ABNORMAL HIGH (ref 70–99)
Glucose-Capillary: 152 mg/dL — ABNORMAL HIGH (ref 70–99)
Glucose-Capillary: 187 mg/dL — ABNORMAL HIGH (ref 70–99)

## 2019-10-18 LAB — MAGNESIUM: Magnesium: 2 mg/dL (ref 1.7–2.4)

## 2019-10-18 LAB — TRIGLYCERIDES: Triglycerides: 65 mg/dL (ref <150)

## 2019-10-18 MED ORDER — METOPROLOL SUCCINATE ER 25 MG PO TB24
12.5000 mg | ORAL_TABLET | Freq: Every day | ORAL | Status: DC
  Administered 2019-10-18 – 2019-10-25 (×8): 12.5 mg via ORAL
  Filled 2019-10-18 (×9): qty 1

## 2019-10-18 MED ORDER — PRAVASTATIN SODIUM 10 MG PO TABS
20.0000 mg | ORAL_TABLET | Freq: Every day | ORAL | Status: DC
  Administered 2019-10-19 – 2019-10-25 (×7): 20 mg via ORAL
  Filled 2019-10-18 (×8): qty 2

## 2019-10-18 NOTE — Progress Notes (Signed)
Patient resting comfortably on 2L [place]. No respiratory distress noted at this time. RT will monitor as needed.

## 2019-10-18 NOTE — Plan of Care (Signed)
°  Problem: Coping: °Goal: Level of anxiety will decrease °Outcome: Progressing °  °

## 2019-10-18 NOTE — Progress Notes (Signed)
Physical Therapy Treatment Patient Details Name: Hannah Galvan MRN: [id number] DOB: 05-26-1931 Today's Date: 10/18/2019    History of Present Illness Pt is an 85 yo female s/p respiratory distress found to have multifocal PNA and pulmonary edema on 4L O2 PMHx: br C, CKD stage III, dementia, HTN.    PT Comments    Pt is able to work with PT in a limited way as she had just been seen by nursing to move extensively and get cleaned up from a bowel movement.  Will continue on with mobility and note that her ankles are quite contracted esp on LLE, in PF posture which will make standing balance a struggle.  Follow acutely for these needs.   Follow Up Recommendations  SNF     Equipment Recommendations  None recommended by PT(allow SNF to decide)    Recommendations for Other Services       Precautions / Restrictions Precautions Precautions: Fall;Other (comment) Precaution Comments: dementia Restrictions Weight Bearing Restrictions: No    Mobility  Bed Mobility Overal bed mobility: Needs Assistance             General bed mobility comments: minor adjustments in bed  Transfers Overall transfer level: Needs assistance               General transfer comment: declined OOB  Ambulation/Gait                 Stairs             Wheelchair Mobility    Modified Rankin (Stroke Patients Only)       Balance Overall balance assessment: Needs assistance                                          Cognition Arousal/Alertness: Awake/alert Behavior During Therapy: Flat affect Overall Cognitive Status: No family/caregiver present to determine baseline cognitive functioning                                 General Comments: dementia at baseline. Following simple commands. Very soft voice and not expanding on any topic.      Exercises General Exercises - Lower Extremity Ankle Circles/Pumps: AAROM;Both;5 reps Heel Slides:  AAROM;Both;10 reps Hip ABduction/ADduction: AAROM;Both;10 reps Straight Leg Raises: AAROM;Both;10 reps Hip Flexion/Marching: AAROM;Both;10 reps    General Comments        Pertinent Vitals/Pain Pain Assessment: No/denies pain    Home Living                      Prior Function            PT Goals (current goals can now be found in the care plan section) Progress towards PT goals: Progressing toward goals    Frequency    Min 2X/week      PT Plan Current plan remains appropriate    Co-evaluation              AM-PAC PT "6 Clicks" Mobility   Outcome Measure  Help needed turning from your back to your side while in a flat bed without using bedrails?: A Lot Help needed moving from lying on your back to sitting on the side of a flat bed without using bedrails?: A Lot Help needed moving to and from a bed to a chair (  including a wheelchair)?: Total Help needed standing up from a chair using your arms (e.g., wheelchair or bedside chair)?: Total Help needed to walk in hospital room?: Total Help needed climbing 3-5 steps with a railing? : Total 6 Click Score: 8    End of Session Equipment Utilized During Treatment: Oxygen Activity Tolerance: Patient tolerated treatment well;Patient limited by fatigue Patient left: in bed;with call bell/phone within reach;with bed alarm set Nurse Communication: Mobility status PT Visit Diagnosis: Other abnormalities of gait and mobility (R26.89);Pain;Muscle weakness (generalized) (M62.81)     Time: [id number] PT Time Calculation (min) (ACUTE ONLY): 31 min  Charges:  $Therapeutic Exercise: 23-37 mins                   Ramond Dial 10/18/2019, 11:11 AM   Mee Hives, PT MS Acute Rehab Dept. Number: [place] [facility] [place]

## 2019-10-18 NOTE — Progress Notes (Signed)
PROGRESS NOTE    Hannah Galvan  [id number] DOB: 10-18-1931 DOA: 10/11/2019 PCP: Susy Frizzle, MD   Brief Narrative:  85 year old female with extensive past medical history includingbut not limited to hypertrophic obstructive cardiomyopathy andadvanced dementia who to the emergency department via EMS from local skilled nursing facility in acuterespiratory distress. According to chart review patient was found on a nightly rounds with increased respiratory distress and work of breathing with an oxygen saturation of 60% on room air.Chest x-ray with signs of early opacities concerning for pulmonary edema versus multifocal infection. While in the emergency department due to concern for COVID-19 patient was removed from NIV therapy with eventual return of increased work of breathing and signs of respiratory fatigue prompting need for endotracheal intubation.  11/18 Patient remained intubated and found to have an EF of 25% from 55%. 11/19 Off vasopressors however remained intubated as she failed SBT 11/20 Diuresed well 11/21 Extubated.  There was some concern for NSTEMI-cardiology was consulted and they do not think patient is a candidate for further cardiology work-up.  Initially managed with IV heparin.  Subjective: No chest pain, no nausea, no vomiting.  Still requiring oxygen supplementation and feeling deconditioned.  Using 1-2 L nasal cannula supplementation.  Very weak and frail on exam.  No fever.  Assessment & Plan:   Active Problems:   Hypokalemia   Alzheimer's dementia without behavioral disturbance (HCC)   HOCM (hypertrophic obstructive cardiomyopathy) (HCC)   Multifocal pneumonia   Acute respiratory failure with hypoxia (HCC)   Heart failure with reduced ejection fraction (HCC)  Acute systolic heart failure -This is new finding as patient was having normal ejection fraction on prior echocardiograms. -Ejection fraction 25 to 30%. -No clinically volume overload at  this time. -Appropriately diurese; cardiology service was consulted and there was concern for NSTEMI.  Patient found not a good candidate for further work-up and recommendations given to pursued medical management only.  -Patient completed 48 hours of IV heparin and since then has been started on low-dose losartan, lopressor, 81 mg of aspirin and statins.  Hypoxic respiratory failure/aspiration pneumonia. -Currently saturating well on 1-2 L [place] -Patient's hypoxic failure was thought to be secondary to acute on chronic heart failure and aspiration pneumonia.   -Patient has completed course of antibiotics for pneumonia on 10/16/19 -Continue use of flutter valve and continue weaning off oxygen supplementation as tolerated  Hypokalemia.   -Repleted and within normal limits currently -Magnesium is also stable today. -Continue checking basic metabolic panel intermittently.  Patient has worsening   Alzheimer's dementia.   -Continue supportive care -Constant reorientation -Continue Zoloft and abilify.   -Patient will be discharged to skilled nursing facility memory unit.  Hypothyroidism -continue synthroid.  GERD -continue PPI   DVT prophylaxis: Lovenox Code Status: DNR Family Communication: No family at bedside Disposition Plan: Pending insurance authorization to discharge back to skilled nursing facility.  objective: Vitals:   10/18/19 0402 10/18/19 0413 10/18/19 0752 10/18/19 1620  BP: 105/67 103/63 122/75 115/69  Pulse: (!) 105 74 (!) 105 86  Resp: (!) 28 (!) '24 18 17  '$ Temp: 98 F (36.7 C) 98.3 F (36.8 C)  97.7 F (36.5 C)  TempSrc: Oral Oral  Oral  SpO2: 92% 94% 91% 96%  Weight:      Height:        Intake/Output Summary (Last 24 hours) at 10/18/2019 1806 Last data filed at 10/18/2019 1247 Gross per 24 hour  Intake 1582.58 ml  Output 700 ml  Net  882.58 ml   Filed Weights   10/13/19 0352 10/14/19 0344 10/15/19 0500  Weight: 70.7 kg 71.2 kg 69 kg    Examination:   General exam: Alert, awake, oriented x 2; chronically ill in appearance, complaining of pain decondition.  Still requiring 2 L nasal cannula supplementation and feeling short of breath.  Expressed intermittent coughing spells. Respiratory system: Positive rhonchi right, no wheezing, no using accessory muscles.  Respiratory effort normal. Cardiovascular system: Rate controlled, no rubs, no gallops.  Gastrointestinal system: Abdomen is nondistended, soft and nontender. No organomegaly or masses felt. Normal bowel sounds heard. Central nervous system: Alert and oriented. No focal neurological deficits. Extremities: No cyanosis or clubbing. Skin: No rashes, no petechiae; left lower extremity bruises appreciated.   Psychiatry: Mood & affect appropriate.    Consultants:   Cardiology  Procedures:  Antimicrobials: Completed course of cefepime, vancomycin and doxycycline Cefepime, vancomycin, doxycycline 11/17-11/21  Data Reviewed: I have personally reviewed following labs and imaging studies  CBC: Recent Labs  Lab 10/13/19 0602 10/14/19 0456 10/15/19 0330 10/16/19 0520 10/18/19 0211  WBC 10.0 7.8 8.2 8.3 6.6  HGB 10.8* 11.2* 12.2 12.1 12.2  HCT 33.4* 33.7* 36.3 36.0 37.4  MCV 100.6* 95.7 95.8 96.3 97.4  PLT 127* 161 157 172 294   Basic Metabolic Panel: Recent Labs  Lab 10/13/19 0602 10/13/19 1642 10/14/19 0456 10/14/19 1714 10/15/19 0330 10/16/19 0520 10/17/19 0907 10/18/19 0211  NA 141  --  140 140 139 140 140 139  K 3.1*  --  3.1* 4.1 3.9 3.4* 2.9* 4.3  CL 109  --  108 108 107 105 106 109  CO2 21*  --  22 20* 20* '26 24 22  '$ GLUCOSE 89  --  114* 91 91 101* 134* 142*  BUN 24*  --  28* 29* 24* '20 20 22  '$ CREATININE 0.73  --  0.61 0.81 0.72 0.67 0.63 0.72  CALCIUM 7.9*  --  8.3* 8.6* 8.8* 8.7* 8.6* 8.3*  MG 2.0 2.1 2.0 2.0  --   --   --  2.0  PHOS 2.1* 2.4* 2.7 2.8  --   --   --   --    GFR: Estimated Creatinine Clearance: 46.5 mL/min (by C-G formula based on SCr of  0.72 mg/dL).  CBG: Recent Labs  Lab 10/17/19 1939 10/17/19 2352 10/18/19 0357 10/18/19 1116 10/18/19 1627  GLUCAP 113* 127* 123* 187* 114*   Lipid Profile: Recent Labs    10/18/19 0211  TRIG 65    Recent Results (from the past 240 hour(s))  Blood culture (routine x 2)     Status: None   Collection Time: 10/11/19 12:34 AM   Specimen: BLOOD  Result Value [place] Status   Specimen Description BLOOD LEFT ANTECUBITAL  Final   Special Requests   Final    BOTTLES DRAWN AEROBIC AND ANAEROBIC Blood Culture adequate volume   Culture   Final    NO GROWTH 5 DAYS Performed at [facility], [address], [place], [place] [address]    Report Status 10/16/2019 FINAL  Final  Blood culture (routine x 2)     Status: None   Collection Time: 10/11/19 12:39 AM   Specimen: BLOOD  Result Value [place] Status   Specimen Description BLOOD RIGHT ARM  Final   Special Requests   Final    BOTTLES DRAWN AEROBIC AND ANAEROBIC Blood Culture adequate volume   Culture   Final    NO GROWTH 5  DAYS Performed at [facility], [place] [address] [place], [place] [address]    Report Status 10/16/2019 FINAL  Final  SARS Coronavirus 2 by RT PCR (hospital order, performed in [facility] hospital lab) Nasopharyngeal Nasopharyngeal Swab     Status: None   Collection Time: 10/11/19 12:39 AM   Specimen: Nasopharyngeal Swab  Result Value [place] Status   SARS Coronavirus 2 NEGATIVE NEGATIVE Final    Comment: (NOTE) If result is NEGATIVE SARS-CoV-2 target nucleic acids are NOT DETECTED. The SARS-CoV-2 RNA is generally detectable in upper and lower  respiratory specimens during the acute phase of infection. The lowest  concentration of SARS-CoV-2 viral copies this assay can detect is 250  copies / mL. A negative result does not preclude SARS-CoV-2 infection  and should not be used as the sole basis for treatment or other  patient management decisions.  A negative result may occur with   improper specimen collection / handling, submission of specimen other  than nasopharyngeal swab, presence of viral mutation(s) within the  areas targeted by this assay, and inadequate number of viral copies  (<250 copies / mL). A negative result must be combined with clinical  observations, patient history, and epidemiological information. If result is POSITIVE SARS-CoV-2 target nucleic acids are DETECTED. The SARS-CoV-2 RNA is generally detectable in upper and lower  respiratory specimens dur ing the acute phase of infection.  Positive  results are indicative of active infection with SARS-CoV-2.  Clinical  correlation with patient history and other diagnostic information is  necessary to determine patient infection status.  Positive results do  not rule out bacterial infection or co-infection with other viruses. If result is PRESUMPTIVE POSTIVE SARS-CoV-2 nucleic acids MAY BE PRESENT.   A presumptive positive result was obtained on the submitted specimen  and confirmed on repeat testing.  While 2019 novel coronavirus  (SARS-CoV-2) nucleic acids may be present in the submitted sample  additional confirmatory testing may be necessary for epidemiological  and / or clinical management purposes  to differentiate between  SARS-CoV-2 and other Sarbecovirus currently known to infect humans.  If clinically indicated additional testing with an alternate test  methodology [phone]) is advised. The SARS-CoV-2 RNA is generally  detectable in upper and lower respiratory sp ecimens during the acute  phase of infection. The expected result is Negative. Fact Sheet for Patients:  StrictlyIdeas.no Fact Sheet for Healthcare Providers: BankingDealers.co.za This test is not yet approved or cleared by the [place] FDA and has been authorized for detection and/or diagnosis of SARS-CoV-2 by FDA under an Emergency Use Authorization (EUA).  This EUA will  remain in effect (meaning this test can be used) for the duration of the COVID-19 declaration under Section 564(b)(1) of the Act, 21 U.S.C. section 360bbb-3(b)(1), unless the authorization is terminated or revoked sooner. Performed at [facility], [place] [address] [place], [place] [address]   Respiratory Panel by PCR     Status: None   Collection Time: 10/11/19  2:25 AM   Specimen: Nasopharyngeal Swab; Respiratory  Result Value [place] Status   Adenovirus NOT DETECTED NOT DETECTED Final   Coronavirus 229E NOT DETECTED NOT DETECTED Final    Comment: (NOTE) The Coronavirus on the Respiratory Panel, DOES NOT test for the novel  Coronavirus (2019 nCoV)    Coronavirus HKU1 NOT DETECTED NOT DETECTED Final   Coronavirus NL63 NOT DETECTED NOT DETECTED Final   Coronavirus OC43 NOT DETECTED NOT DETECTED Final   Metapneumovirus NOT  DETECTED NOT DETECTED Final   Rhinovirus / Enterovirus NOT DETECTED NOT DETECTED Final   Influenza A NOT DETECTED NOT DETECTED Final   Influenza B NOT DETECTED NOT DETECTED Final   Parainfluenza Virus 1 NOT DETECTED NOT DETECTED Final   Parainfluenza Virus 2 NOT DETECTED NOT DETECTED Final   Parainfluenza Virus 3 NOT DETECTED NOT DETECTED Final   Parainfluenza Virus 4 NOT DETECTED NOT DETECTED Final   Respiratory Syncytial Virus NOT DETECTED NOT DETECTED Final   Bordetella pertussis NOT DETECTED NOT DETECTED Final   Chlamydophila pneumoniae NOT DETECTED NOT DETECTED Final   Mycoplasma pneumoniae NOT DETECTED NOT DETECTED Final    Comment: Performed at [facility], [place] [address] [place], [place] [address]  MRSA PCR Screening     Status: None   Collection Time: 10/11/19  2:25 AM   Specimen: Nasopharyngeal  Result Value [place] Status   MRSA by PCR NEGATIVE NEGATIVE Final    Comment:        The GeneXpert MRSA Assay (FDA approved for NASAL specimens only), is one component of a comprehensive MRSA colonization surveillance program. It is not  intended to diagnose MRSA infection nor to guide or monitor treatment for MRSA infections. Performed at [facility], [place] [address] [place], [place] [address]      Radiology Studies: No results found.  Scheduled Meds: . ARIPiprazole  2 mg Per Tube Daily  . aspirin EC  81 mg Oral Daily  . chlorhexidine  15 mL Mouth Rinse BID  . chlorhexidine gluconate (MEDLINE KIT)  15 mL Mouth Rinse BID  . Chlorhexidine Gluconate Cloth  6 each Topical Daily  . enoxaparin (LOVENOX) injection  40 mg Subcutaneous Q24H  . levothyroxine  125 mcg Per Tube Q0600  . losartan  12.5 mg Oral Daily  . mouth rinse  15 mL Mouth Rinse q12n4p  . pantoprazole sodium  40 mg Per Tube Daily  . sertraline  100 mg Per Tube Daily   Continuous Infusions: . sodium chloride 10 mL/hr at 10/12/19 0000     LOS: 7 days   Time spent: 30 minutes.  Barton Dubois, MD [facility] Pager [phone]  10/18/2019, 6:06 PM

## 2019-10-18 NOTE — Progress Notes (Signed)
Occupational Therapy Treatment Patient Details Name: Hannah Galvan MRN: [id number] DOB: 01/17/1931 Today's Date: 10/18/2019    History of present illness Pt is an 85 yo female s/p respiratory distress found to have multifocal PNA and pulmonary edema on 4L O2 PMHx: br C, CKD stage III, dementia, HTN.   OT comments  Upon arrival patient semi-supine in bed stating she had a bowel movement. Patient require mod A to roll to her left and max A to roll right for peri care, mod cues for use of bed rails. Patient feeling fatigued after bed mobility, agreeable to UB exercises. Patient require mod verbal and tactile cues for proper technique.    Follow Up Recommendations  SNF;Supervision/Assistance - 24 hour    Equipment Recommendations  Other (comment)(to be determined at next venue)       Precautions / Restrictions Precautions Precautions: Fall;Other (comment) Precaution Comments: dementia Restrictions Weight Bearing Restrictions: No       Mobility Bed Mobility Overal bed mobility: Needs Assistance Bed Mobility: Rolling Rolling: Mod assist;Max assist         General bed mobility comments: mod verbal cues to utilize bed rails to assist with rolling  Transfers Overall transfer level: Needs assistance               General transfer comment: patient declined OOB due to fatigue from rolling + incontinent of stool    Balance Overall balance assessment: Needs assistance                                         ADL either performed or assessed with clinical judgement   ADL Overall ADL's : Needs assistance/impaired     Grooming: Wash/dry face;Minimal assistance;Bed level Grooming Details (indicate cue type and reason): min A for thoroughness as patient still had food on her face                                               Cognition Arousal/Alertness: Lethargic Behavior During Therapy: Flat affect Overall Cognitive Status: No  family/caregiver present to determine baseline cognitive functioning                                 General Comments: dementia at baseline. patient follows simple commands. soft spoken/difficult to understand at times        Exercises Exercises: General Upper Extremity General Exercises - Upper Extremity Shoulder Flexion: AROM;20 reps;Both;Supine Elbow Flexion: AROM;20 reps;Both;Supine Elbow Extension: AROM;Both;20 reps;Supine            Pertinent Vitals/ Pain       Pain Assessment: Faces Faces Pain Scale: No hurt         Frequency  Min 2X/week        Progress Toward Goals  OT Goals(current goals can now be found in the care plan section)  Progress towards OT goals: Progressing toward goals  Acute Rehab OT Goals Patient Stated Goal: "Call my son" OT Goal Formulation: With patient Time For Goal Achievement: 10/29/19 Potential to Achieve Goals: Good ADL Goals Pt Will Perform Eating: with set-up;sitting Pt Will Perform Grooming: with set-up;sitting Pt Will Perform Lower Body Dressing: with mod assist;sitting/lateral leans Pt Will Transfer to Toilet: with  mod assist;squat pivot transfer;bedside commode Pt/caregiver will Perform Home Exercise Program: Increased strength;Both right and left upper extremity  Plan Discharge plan remains appropriate       AM-PAC OT "6 Clicks" Daily Activity     Outcome Measure   Help from another person eating meals?: A Lot Help from another person taking care of personal grooming?: A Lot Help from another person toileting, which includes using toliet, bedpan, or urinal?: Total Help from another person bathing (including washing, rinsing, drying)?: Total Help from another person to put on and taking off regular upper body clothing?: A Lot Help from another person to put on and taking off regular lower body clothing?: Total 6 Click Score: 9    End of Session  OT Visit Diagnosis: Unsteadiness on feet (R26.81);Muscle  weakness (generalized) (M62.81)   Activity Tolerance Patient limited by fatigue   Patient Left in bed;with call bell/phone within reach;with bed alarm set   Nurse Communication Mobility status        Time: [id number] OT Time Calculation (min): 18 min  Charges: OT General Charges $OT Visit: 1 Visit OT Treatments $Therapeutic Exercise: 8-22 mins  [place] OT office: [place] 10/18/2019, 2:44 PM

## 2019-10-19 LAB — GLUCOSE, CAPILLARY
Glucose-Capillary: 95 mg/dL (ref 70–99)
Glucose-Capillary: 120 mg/dL — ABNORMAL HIGH (ref 70–99)
Glucose-Capillary: 94 mg/dL (ref 70–99)
Glucose-Capillary: 81 mg/dL (ref 70–99)
Glucose-Capillary: 103 mg/dL — ABNORMAL HIGH (ref 70–99)

## 2019-10-19 MED ORDER — PHENYLEPHRINE HCL-NACL 10-0.9 MG/250ML-% IV SOLN
INTRAVENOUS | Status: AC
  Filled 2019-10-19: qty 250

## 2019-10-19 NOTE — Plan of Care (Signed)
  Problem: Education: Goal: Knowledge of General Education information will improve Description Including pain rating scale, medication(s)/side effects and non-pharmacologic comfort measures Outcome: Progressing   

## 2019-10-19 NOTE — Progress Notes (Signed)
PROGRESS NOTE    Hannah Galvan  GLO:[id number] DOB: [birth year] DOA: 10/11/2019 PCP: Susy Frizzle, MD     Brief Narrative:  Hannah Galvan is a 85 year old female with extensive past medical history includingbut not limited to hypertrophic obstructive cardiomyopathy andadvanced dementia who to the emergency department via EMS from local skilled nursing facility in acuterespiratory distress. According to chart review patient was found on a nightly rounds with increased respiratory distress and work of breathing with an oxygen saturation of 60% on room air.Chest x-ray with signs of early opacities concerning for pulmonary edema versus multifocal infection. While in the emergency department due to concern for COVID-19 patient was removed from NIV therapy with eventual return of increased work of breathing and signs of respiratory fatigue prompting need for endotracheal intubation.  11/18 Patient remained intubated and found to have an EF of 25% from 55%. 11/19 Off vasopressors however remained intubated as she failed SBT 11/20 Diuresed well 11/21 Extubated   New events last 24 hours / Subjective: No new complaints today. Pleasantly confused today.   Assessment & Plan:   Active Problems:   Hypokalemia   Alzheimer's dementia without behavioral disturbance (HCC)   HOCM (hypertrophic obstructive cardiomyopathy) (HCC)   Multifocal pneumonia   Acute respiratory failure with hypoxia (HCC)   Heart failure with reduced ejection fraction (HCC)   Acute systolic heart failure -This is new finding as patient was having normal ejection fraction on prior echocardiograms. -Ejection fraction 25 to 30%. -No clinically volume overload at this time. -Appropriately diuresed; cardiology service was consulted and there was concern for NSTEMI.  Patient found not a good candidate for further work-up and recommendations given to pursued medical management only.  -Patient completed 48 hours of IV  heparin and since then has been started on low-dose losartan, lopressor, 81 mg of aspirin and statins   Acute hypoxic respiratory failure secondary aspiration pneumonia -Patient's hypoxic failure was thought to be secondary to acute on chronic heart failure and aspiration pneumonia -RVP negative   -Blood culture negative  -Covid negative  -Currently saturating well on 1-2 L [place] -Patient has completed course of antibiotics for pneumonia on 10/16/19  Alzheimer's dementia  -Patient will be discharged to skilled nursing facility memory unit -Continue Zoloft and abilify  Hypothyroidism -Continue synthroid  GERD -Continue PPI    DVT prophylaxis: Lovenox  Code Status: DNR  Family Communication: None at bedside  Disposition Plan: Insurance auth pending, Covid test ordered pending SNF placement   Consultants:   Cardiology   Antimicrobials:  Anti-infectives (From admission, onward)   Start     Dose/Rate Route Frequency Ordered Stop   10/13/19 0600  vancomycin (VANCOCIN) 1,250 mg in sodium chloride 0.9 % 250 mL IVPB  Status:  Discontinued     1,250 mg 166.7 mL/hr over 90 Minutes Intravenous Every 48 hours 10/11/19 0311 10/12/19 1230   10/11/19 1200  ceFEPIme (MAXIPIME) 2 g in sodium chloride 0.9 % 100 mL IVPB     2 g 200 mL/hr over 30 Minutes Intravenous Every 12 hours 10/11/19 0311 10/14/19 1248   10/11/19 0500  doxycycline (VIBRAMYCIN) 100 mg in sodium chloride 0.9 % 250 mL IVPB     100 mg 125 mL/hr over 120 Minutes Intravenous Every 12 hours 10/11/19 0417 10/14/19 1849   10/11/19 0100  vancomycin (VANCOCIN) IVPB 1000 mg/200 mL premix  Status:  Discontinued     1,000 mg 200 mL/hr over 60 Minutes Intravenous  Once 10/11/19 0050 10/11/19 0055  10/11/19 0100  ceFEPIme (MAXIPIME) 2 g in sodium chloride 0.9 % 100 mL IVPB     2 g 200 mL/hr over 30 Minutes Intravenous  Once 10/11/19 0050 10/11/19 0215   10/11/19 0100  vancomycin (VANCOCIN) 1,250 mg in sodium chloride 0.9 % 250  mL IVPB     1,250 mg 166.7 mL/hr over 90 Minutes Intravenous  Once 10/11/19 0056 10/11/19 0440        Objective: Vitals:   10/18/19 1620 10/18/19 2020 10/19/19 0434 10/19/19 0919  BP: 115/69 127/76 130/71 135/76  Pulse: 86 95 80 74  Resp: _0 Temp: 97.7 F (36.5 C) 98.4 F (36.9 C) 98.4 F (36.9 C) 98 F (36.7 C)  TempSrc: Oral   Oral  SpO2: 96% 93% 99% 98%  Weight:   65 kg   Height:        Intake/Output Summary (Last 24 hours) at 10/19/2019 1207 Last data filed at 10/19/2019 0900 Gross per 24 hour  Intake 420 ml  Output 100 ml  Net 320 ml   Filed Weights   10/14/19 0344 10/15/19 0500 10/19/19 0434  Weight: 71.2 kg 69 kg 65 kg    Examination:  General exam: Appears calm and comfortable  Respiratory system: Clear to auscultation anteriorly. Respiratory effort normal. No respiratory distress. No conversational dyspnea. On 2L [place] O2 this morning  Cardiovascular system: S1 & S2 heard, RRR. No murmurs. No pedal edema. Gastrointestinal system: Abdomen is nondistended, soft and nontender. Normal bowel sounds heard. Central nervous system: Alert Extremities: Symmetric in appearance  Skin: No rashes, lesions or ulcers on exposed skin  Psychiatry: Dementia   Data Reviewed: I have personally reviewed following labs and imaging studies  CBC: Recent Labs  Lab 10/13/19 0602 10/14/19 0456 10/15/19 0330 10/16/19 0520 10/18/19 0211  WBC 10.0 7.8 8.2 8.3 6.6  HGB 10.8* 11.2* 12.2 12.1 12.2  HCT 33.4* 33.7* 36.3 36.0 37.4  MCV 100.6* 95.7 95.8 96.3 97.4  PLT 127* 161 157 172 623   Basic Metabolic Panel: Recent Labs  Lab 10/13/19 0602 10/13/19 1642 10/14/19 0456 10/14/19 1714 10/15/19 0330 10/16/19 0520 10/17/19 0907 10/18/19 0211  NA 141  --  140 140 139 140 140 139  K 3.1*  --  3.1* 4.1 3.9 3.4* 2.9* 4.3  CL 109  --  108 108 107 105 106 109  CO2 21*  --  22 20* 20* _1 GLUCOSE 89  --  114* 91 91 101* 134* 142*  BUN 24*  --  28* 29* 24* _2 CREATININE 0.73  --  0.61 0.81 0.72 0.67 0.63 0.72  CALCIUM 7.9*  --  8.3* 8.6* 8.8* 8.7* 8.6* 8.3*  MG 2.0 2.1 2.0 2.0  --   --   --  2.0  PHOS 2.1* 2.4* 2.7 2.8  --   --   --   --    GFR: Estimated Creatinine Clearance: 45.3 mL/min (by C-G formula based on SCr of 0.72 mg/dL). Liver Function Tests: No results for input(s): AST, ALT, ALKPHOS, BILITOT, PROT, ALBUMIN in the last 168 hours. No results for input(s): LIPASE, AMYLASE in the last 168 hours. No results for input(s): AMMONIA in the last 168 hours. Coagulation Profile: No results for input(s): INR, PROTIME in the last 168 hours. Cardiac Enzymes: No results for input(s): CKTOTAL, CKMB, CKMBINDEX, TROPONINI in the last 168 hours. BNP (last 3 results) No results for input(s): PROBNP in the last 8760 hours. HbA1C:  No results for input(s): HGBA1C in the last 72 hours. CBG: Recent Labs  Lab 10/18/19 2021 10/18/19 2355 10/19/19 0434 10/19/19 0718 10/19/19 1123  GLUCAP 141* 152* 95 120* 94   Lipid Profile: Recent Labs    10/18/19 0211  TRIG 65   Thyroid Function Tests: No results for input(s): TSH, T4TOTAL, FREET4, T3FREE, THYROIDAB in the last 72 hours. Anemia Panel: No results for input(s): VITAMINB12, FOLATE, FERRITIN, TIBC, IRON, RETICCTPCT in the last 72 hours. Sepsis Labs: No results for input(s): PROCALCITON, LATICACIDVEN in the last 168 hours.  Recent Results (from the past 240 hour(s))  Blood culture (routine x 2)     Status: None   Collection Time: 10/11/19 12:34 AM   Specimen: BLOOD  Result Value Ref Range Status   Specimen Description BLOOD LEFT ANTECUBITAL  Final   Special Requests   Final    BOTTLES DRAWN AEROBIC AND ANAEROBIC Blood Culture adequate volume   Culture   Final    NO GROWTH 5 DAYS Performed at [facility], [address] [place], [place] [address]    Report Status 10/16/2019 FINAL  Final  Blood culture (routine x 2)     Status: None   Collection Time: 10/11/19 12:39 AM    Specimen: BLOOD  Result Value Ref Range Status   Specimen Description BLOOD RIGHT ARM  Final   Special Requests   Final    BOTTLES DRAWN AEROBIC AND ANAEROBIC Blood Culture adequate volume   Culture   Final    NO GROWTH 5 DAYS Performed at [facility], [place] [address] [place], [place] [address]    Report Status 10/16/2019 FINAL  Final  SARS Coronavirus 2 by RT PCR (hospital order, performed in [facility] hospital lab) Nasopharyngeal Nasopharyngeal Swab     Status: None   Collection Time: 10/11/19 12:39 AM   Specimen: Nasopharyngeal Swab  Result Value Ref Range Status   SARS Coronavirus 2 NEGATIVE NEGATIVE Final    Comment: (NOTE) If result is NEGATIVE SARS-CoV-2 target nucleic acids are NOT DETECTED. The SARS-CoV-2 RNA is generally detectable in upper and lower  respiratory specimens during the acute phase of infection. The lowest  concentration of SARS-CoV-2 viral copies this assay can detect is 250  copies / mL. A negative result does not preclude SARS-CoV-2 infection  and should not be used as the sole basis for treatment or other  patient management decisions.  A negative result may occur with  improper specimen collection / handling, submission of specimen other  than nasopharyngeal swab, presence of viral mutation(s) within the  areas targeted by this assay, and inadequate number of viral copies  (<250 copies / mL). A negative result must be combined with clinical  observations, patient history, and epidemiological information. If result is POSITIVE SARS-CoV-2 target nucleic acids are DETECTED. The SARS-CoV-2 RNA is generally detectable in upper and lower  respiratory specimens dur ing the acute phase of infection.  Positive  results are indicative of active infection with SARS-CoV-2.  Clinical  correlation with patient history and other diagnostic information is  necessary to determine patient infection status.  Positive results do  not rule out bacterial infection  or co-infection with other viruses. If result is PRESUMPTIVE POSTIVE SARS-CoV-2 nucleic acids MAY BE PRESENT.   A presumptive positive result was obtained on the submitted specimen  and confirmed on repeat testing.  While 2019 novel coronavirus  (SARS-CoV-2) nucleic acids may be present in the submitted sample  additional confirmatory  testing may be necessary for epidemiological  and / or clinical management purposes  to differentiate between  SARS-CoV-2 and other Sarbecovirus currently known to infect humans.  If clinically indicated additional testing with an alternate test  methodology [phone]) is advised. The SARS-CoV-2 RNA is generally  detectable in upper and lower respiratory sp ecimens during the acute  phase of infection. The expected result is Negative. Fact Sheet for Patients:  StrictlyIdeas.no Fact Sheet for Healthcare Providers: BankingDealers.co.za This test is not yet approved or cleared by the [place] FDA and has been authorized for detection and/or diagnosis of SARS-CoV-2 by FDA under an Emergency Use Authorization (EUA).  This EUA will remain in effect (meaning this test can be used) for the duration of the COVID-19 declaration under Section 564(b)(1) of the Act, 21 U.S.C. section 360bbb-3(b)(1), unless the authorization is terminated or revoked sooner. Performed at [facility], [place] [address] [place], [place] [address]   Respiratory Panel by PCR     Status: None   Collection Time: 10/11/19  2:25 AM   Specimen: Nasopharyngeal Swab; Respiratory  Result Value Ref Range Status   Adenovirus NOT DETECTED NOT DETECTED Final   Coronavirus 229E NOT DETECTED NOT DETECTED Final    Comment: (NOTE) The Coronavirus on the Respiratory Panel, DOES NOT test for the novel  Coronavirus (2019 nCoV)    Coronavirus HKU1 NOT DETECTED NOT DETECTED Final   Coronavirus NL63 NOT DETECTED NOT DETECTED Final   Coronavirus OC43  NOT DETECTED NOT DETECTED Final   Metapneumovirus NOT DETECTED NOT DETECTED Final   Rhinovirus / Enterovirus NOT DETECTED NOT DETECTED Final   Influenza A NOT DETECTED NOT DETECTED Final   Influenza B NOT DETECTED NOT DETECTED Final   Parainfluenza Virus 1 NOT DETECTED NOT DETECTED Final   Parainfluenza Virus 2 NOT DETECTED NOT DETECTED Final   Parainfluenza Virus 3 NOT DETECTED NOT DETECTED Final   Parainfluenza Virus 4 NOT DETECTED NOT DETECTED Final   Respiratory Syncytial Virus NOT DETECTED NOT DETECTED Final   Bordetella pertussis NOT DETECTED NOT DETECTED Final   Chlamydophila pneumoniae NOT DETECTED NOT DETECTED Final   Mycoplasma pneumoniae NOT DETECTED NOT DETECTED Final    Comment: Performed at [facility], [place] [address] [place], [place] [address]  MRSA PCR Screening     Status: None   Collection Time: 10/11/19  2:25 AM   Specimen: Nasopharyngeal  Result Value Ref Range Status   MRSA by PCR NEGATIVE NEGATIVE Final    Comment:        The GeneXpert MRSA Assay (FDA approved for NASAL specimens only), is one component of a comprehensive MRSA colonization surveillance program. It is not intended to diagnose MRSA infection nor to guide or monitor treatment for MRSA infections. Performed at [facility], [place] [address], [place], [place] [address]       Radiology Studies: No results found.    Scheduled Meds: . ARIPiprazole  2 mg Per Tube Daily  . aspirin EC  81 mg Oral Daily  . chlorhexidine  15 mL Mouth Rinse BID  . chlorhexidine gluconate (MEDLINE KIT)  15 mL Mouth Rinse BID  . Chlorhexidine Gluconate Cloth  6 each Topical Daily  . enoxaparin (LOVENOX) injection  40 mg Subcutaneous Q24H  . levothyroxine  125 mcg Per Tube Q0600  . losartan  12.5 mg Oral Daily  . mouth rinse  15 mL Mouth Rinse q12n4p  . metoprolol succinate  12.5 mg Oral Daily  .  pantoprazole sodium  40 mg Per Tube Daily  . pravastatin  20 mg Oral Daily  . sertraline  100 mg Per  Tube Daily   Continuous Infusions: . sodium chloride 10 mL/hr at 10/12/19 0000     LOS: 8 days      Time spent: 35 minutes   Dessa Phi, DO [facility] 10/19/2019, 12:07 PM   Available via Epic secure chat 7am-7pm After these hours, please refer to coverage provider listed on [email or web address]

## 2019-10-19 NOTE — Clinical Social Work Note (Signed)
Patient will discharge to [facility] once insurance authorization received from [place] test result are in. Patient and son updated. CSW will continue to follow, provide SW intervention services as needed and facilitate discharge to [facility] once auth received and COVID results are in.  Vyncent Overby Givens, MSW, LCSW Licensed Clinical Social Worker [place] [phone]

## 2019-10-20 DIAGNOSIS — U071 COVID-19: Secondary | ICD-10-CM

## 2019-10-20 DIAGNOSIS — J1289 Other viral pneumonia: Secondary | ICD-10-CM

## 2019-10-20 LAB — GLUCOSE, CAPILLARY
Glucose-Capillary: 102 mg/dL — ABNORMAL HIGH (ref 70–99)
Glucose-Capillary: 113 mg/dL — ABNORMAL HIGH (ref 70–99)
Glucose-Capillary: 71 mg/dL (ref 70–99)
Glucose-Capillary: 76 mg/dL (ref 70–99)

## 2019-10-20 LAB — SARS CORONAVIRUS 2 (TAT 6-24 HRS): SARS Coronavirus 2: POSITIVE — AB

## 2019-10-20 MED ORDER — SODIUM CHLORIDE 0.9 % IV SOLN
100.0000 mg | INTRAVENOUS | Status: DC
  Administered 2019-10-22 – 2019-10-23 (×2): 100 mg via INTRAVENOUS
  Filled 2019-10-20 (×5): qty 20

## 2019-10-20 MED ORDER — SODIUM CHLORIDE 0.9 % IV SOLN
200.0000 mg | Freq: Once | INTRAVENOUS | Status: AC
  Administered 2019-10-20: 200 mg via INTRAVENOUS
  Filled 2019-10-20: qty 40

## 2019-10-20 MED ORDER — DEXAMETHASONE SODIUM PHOSPHATE 10 MG/ML IJ SOLN
6.0000 mg | INTRAMUSCULAR | Status: DC
  Administered 2019-10-20 – 2019-10-24 (×5): 6 mg via INTRAVENOUS
  Filled 2019-10-20 (×5): qty 1

## 2019-10-20 NOTE — Progress Notes (Signed)
[name] called and provided update about his mother's care. Patient's son verbalized understanding about current plan.  Patient was able to talk to her son for a few minutes.

## 2019-10-20 NOTE — Progress Notes (Signed)
PROGRESS NOTE    Hannah Galvan  EZM:[id number] DOB: 1930/12/06 DOA: 10/11/2019 PCP: Susy Frizzle, MD     Brief Narrative:  Hannah Galvan is a 85 year old female with extensive past medical history includingbut not limited to hypertrophic obstructive cardiomyopathy andadvanced dementia who to the emergency department via EMS from local skilled nursing facility in acuterespiratory distress. According to chart review patient was found on a nightly rounds with increased respiratory distress and work of breathing with an oxygen saturation of 60% on room air.Chest x-ray with signs of early opacities concerning for pulmonary edema versus multifocal infection. While in the emergency department due to concern for COVID-19 patient was removed from NIV therapy with eventual return of increased work of breathing and signs of respiratory fatigue prompting need for endotracheal intubation.  11/18 Patient remained intubated and found to have an EF of 25% from 55%. 11/19 Off vasopressors however remained intubated as she failed SBT 11/20 Diuresed well 11/21 Extubated   New events last 24 hours / Subjective: Patient is confused.  Unable to obtain any history from the patient.  Per nursing staff no overnight events. Assessment & Plan:   Active Problems:   Hypokalemia   Alzheimer's dementia without behavioral disturbance (HCC)   HOCM (hypertrophic obstructive cardiomyopathy) (HCC)   Multifocal pneumonia   Acute respiratory failure with hypoxia (HCC)   Heart failure with reduced ejection fraction (HCC)   ##Acute systolic heart failure -This is new finding as patient was having normal ejection fraction on prior echocardiograms. -Ejection fraction 25 to 30%. -No clinically volume overload at this time. -Appropriately diuresed; cardiology service was consulted and there was concern for NSTEMI.  Patient found not a good candidate for further work-up and recommendations given to pursued medical  management only.  -Patient completed 48 hours of IV heparin and since then has been started on low-dose losartan, lopressor, 81 mg of aspirin and statins   ##Acute hypoxic respiratory failure secondary aspiration pneumonia, COVID-19, CHF -Patient's hypoxic failure was thought to be secondary to acute on chronic heart failure and aspiration pneumonia, COVID-19 pneumonia -Patient required intubation, extubated on 10/18/2019 -RVP negative   -Blood culture negative  -Covid negative  -Currently saturating well on 1-2 L [place] -Patient has completed course of antibiotics for pneumonia on 10/16/19  ##COVID-19 pneumonia -Patient was tested x3 in the past was negative, came back to be positive on 10/19/2019 -Start the patient on dexamethasone, remdesivir -Patient is moved to Covid unit to continue with the contact, airborne precautions.  ##Multifocal pneumonia COVID-19 pneumonia -Continue with remdesivir, dexamethasone  ##Septic shock -Secondary to pneumonia -Completed the course of the antibiotics  ##Congestive heart failure systolic acute on chronic -Echocardiogram done from 10/11/2019 showed EF of 25 to 30%.  Akinesis of the mid/distal anteroseptal wall and apex with the overall severe LV dysfunction.  ##Hypokalemia -Replaced, normalized  ##Hypertrophic obstructive cardiomyopathy -Continue with the Lasix  ##Elevated troponins -Patient is evaluated by cardiology, felt to be from demand ischemia -No further work-up  ##Dysphagia -Modified barium swallow-recommended pured diet with the nectar thick liquids, aspiration precautions with the seated upright at 90 degrees. -Continue with speech therapy  ##Alzheimer's dementia  -Patient will be discharged to skilled nursing facility memory unit -Continue Zoloft and abilify  ##Hypothyroidism -Continue synthroid  ##GERD -Continue PPI    DVT prophylaxis: Lovenox  Code Status: DNR  Family Communication: None at bedside   Disposition Plan: Insurance auth pending, Covid test ordered pending SNF placement   Consultants:   Cardiology  Antimicrobials:  Anti-infectives (From admission, onward)   Start     Dose/Rate Route Frequency Ordered Stop   10/13/19 0600  vancomycin (VANCOCIN) 1,250 mg in sodium chloride 0.9 % 250 mL IVPB  Status:  Discontinued     1,250 mg 166.7 mL/hr over 90 Minutes Intravenous Every 48 hours 10/11/19 0311 10/12/19 1230   10/11/19 1200  ceFEPIme (MAXIPIME) 2 g in sodium chloride 0.9 % 100 mL IVPB     2 g 200 mL/hr over 30 Minutes Intravenous Every 12 hours 10/11/19 0311 10/14/19 1248   10/11/19 0500  doxycycline (VIBRAMYCIN) 100 mg in sodium chloride 0.9 % 250 mL IVPB     100 mg 125 mL/hr over 120 Minutes Intravenous Every 12 hours 10/11/19 0417 10/14/19 1849   10/11/19 0100  vancomycin (VANCOCIN) IVPB 1000 mg/200 mL premix  Status:  Discontinued     1,000 mg 200 mL/hr over 60 Minutes Intravenous  Once 10/11/19 0050 10/11/19 0055   10/11/19 0100  ceFEPIme (MAXIPIME) 2 g in sodium chloride 0.9 % 100 mL IVPB     2 g 200 mL/hr over 30 Minutes Intravenous  Once 10/11/19 0050 10/11/19 0215   10/11/19 0100  vancomycin (VANCOCIN) 1,250 mg in sodium chloride 0.9 % 250 mL IVPB     1,250 mg 166.7 mL/hr over 90 Minutes Intravenous  Once 10/11/19 0056 10/11/19 0440       Objective: Vitals:   10/19/19 2134 10/20/19 0325 10/20/19 0333 10/20/19 0829  BP:   114/61 (!) 152/74  Pulse: 86  76 80  Resp:    16  Temp:   99.6 F (37.6 C) 98.8 F (37.1 C)  TempSrc:   Oral Oral  SpO2: 92%  94% 94%  Weight:  66.6 kg    Height:        Intake/Output Summary (Last 24 hours) at 10/20/2019 0924 Last data filed at 10/19/2019 2126 Gross per 24 hour  Intake 660 ml  Output 110 ml  Net 550 ml   Filed Weights   10/15/19 0500 10/19/19 0434 10/20/19 0325  Weight: 69 kg 65 kg 66.6 kg    Examination:  General exam: Appears calm and comfortable  Respiratory system: Clear to auscultation  anteriorly. Respiratory effort normal. No respiratory distress. No conversational dyspnea. On 2L [place] O2 this morning  Cardiovascular system: S1 & S2 heard, RRR. No murmurs. No pedal edema. Gastrointestinal system: Abdomen is nondistended, soft and nontender. Normal bowel sounds heard. Central nervous system: Alert Extremities: Symmetric in appearance  Skin: No rashes, lesions or ulcers on exposed skin  Psychiatry: Dementia   Data Reviewed: I have personally reviewed following labs and imaging studies  CBC: Recent Labs  Lab 10/14/19 0456 10/15/19 0330 10/16/19 0520 10/18/19 0211  WBC 7.8 8.2 8.3 6.6  HGB 11.2* 12.2 12.1 12.2  HCT 33.7* 36.3 36.0 37.4  MCV 95.7 95.8 96.3 97.4  PLT 161 157 172 245   Basic Metabolic Panel: Recent Labs  Lab 10/13/19 1642  10/14/19 0456 10/14/19 1714 10/15/19 0330 10/16/19 0520 10/17/19 0907 10/18/19 0211  NA  --    < > 140 140 139 140 140 139  K  --    < > 3.1* 4.1 3.9 3.4* 2.9* 4.3  CL  --    < > 108 108 107 105 106 109  CO2  --    < > 22 20* 20* _0 GLUCOSE  --    < > 114* 91 91 101* 134* 142*  BUN  --    < >  28* 29* 24* _0 CREATININE  --    < > 0.61 0.81 0.72 0.67 0.63 0.72  CALCIUM  --    < > 8.3* 8.6* 8.8* 8.7* 8.6* 8.3*  MG 2.1  --  2.0 2.0  --   --   --  2.0  PHOS 2.4*  --  2.7 2.8  --   --   --   --    < > = values in this interval not displayed.   GFR: Estimated Creatinine Clearance: 45.7 mL/min (by C-G formula based on SCr of 0.72 mg/dL). Liver Function Tests: No results for input(s): AST, ALT, ALKPHOS, BILITOT, PROT, ALBUMIN in the last 168 hours. No results for input(s): LIPASE, AMYLASE in the last 168 hours. No results for input(s): AMMONIA in the last 168 hours. Coagulation Profile: No results for input(s): INR, PROTIME in the last 168 hours. Cardiac Enzymes: No results for input(s): CKTOTAL, CKMB, CKMBINDEX, TROPONINI in the last 168 hours. BNP (last 3 results) No results for input(s): PROBNP in the last  8760 hours. HbA1C: No results for input(s): HGBA1C in the last 72 hours. CBG: Recent Labs  Lab 10/19/19 1626 10/19/19 2050 10/20/19 0011 10/20/19 0349 10/20/19 0822  GLUCAP 81 103* 76 102* 71   Lipid Profile: Recent Labs    10/18/19 0211  TRIG 65   Thyroid Function Tests: No results for input(s): TSH, T4TOTAL, FREET4, T3FREE, THYROIDAB in the last 72 hours. Anemia Panel: No results for input(s): VITAMINB12, FOLATE, FERRITIN, TIBC, IRON, RETICCTPCT in the last 72 hours. Sepsis Labs: No results for input(s): PROCALCITON, LATICACIDVEN in the last 168 hours.  Recent Results (from the past 240 hour(s))  Blood culture (routine x 2)     Status: None   Collection Time: 10/11/19 12:34 AM   Specimen: BLOOD  Result Value Ref Range Status   Specimen Description BLOOD LEFT ANTECUBITAL  Final   Special Requests   Final    BOTTLES DRAWN AEROBIC AND ANAEROBIC Blood Culture adequate volume   Culture   Final    NO GROWTH 5 DAYS Performed at [facility], [address] [place], [place] [address]    Report Status 10/16/2019 FINAL  Final  Blood culture (routine x 2)     Status: None   Collection Time: 10/11/19 12:39 AM   Specimen: BLOOD  Result Value Ref Range Status   Specimen Description BLOOD RIGHT ARM  Final   Special Requests   Final    BOTTLES DRAWN AEROBIC AND ANAEROBIC Blood Culture adequate volume   Culture   Final    NO GROWTH 5 DAYS Performed at [facility], [place] [address] [place], [place] [address]    Report Status 10/16/2019 FINAL  Final  SARS Coronavirus 2 by RT PCR (hospital order, performed in [facility] hospital lab) Nasopharyngeal Nasopharyngeal Swab     Status: None   Collection Time: 10/11/19 12:39 AM   Specimen: Nasopharyngeal Swab  Result Value Ref Range Status   SARS Coronavirus 2 NEGATIVE NEGATIVE Final    Comment: (NOTE) If result is NEGATIVE SARS-CoV-2 target nucleic acids are NOT DETECTED. The SARS-CoV-2 RNA is generally detectable in  upper and lower  respiratory specimens during the acute phase of infection. The lowest  concentration of SARS-CoV-2 viral copies this assay can detect is 250  copies / mL. A negative result does not preclude SARS-CoV-2 infection  and should not be used as the sole basis for treatment or other  patient  management decisions.  A negative result may occur with  improper specimen collection / handling, submission of specimen other  than nasopharyngeal swab, presence of viral mutation(s) within the  areas targeted by this assay, and inadequate number of viral copies  (<250 copies / mL). A negative result must be combined with clinical  observations, patient history, and epidemiological information. If result is POSITIVE SARS-CoV-2 target nucleic acids are DETECTED. The SARS-CoV-2 RNA is generally detectable in upper and lower  respiratory specimens dur ing the acute phase of infection.  Positive  results are indicative of active infection with SARS-CoV-2.  Clinical  correlation with patient history and other diagnostic information is  necessary to determine patient infection status.  Positive results do  not rule out bacterial infection or co-infection with other viruses. If result is PRESUMPTIVE POSTIVE SARS-CoV-2 nucleic acids MAY BE PRESENT.   A presumptive positive result was obtained on the submitted specimen  and confirmed on repeat testing.  While 2019 novel coronavirus  (SARS-CoV-2) nucleic acids may be present in the submitted sample  additional confirmatory testing may be necessary for epidemiological  and / or clinical management purposes  to differentiate between  SARS-CoV-2 and other Sarbecovirus currently known to infect humans.  If clinically indicated additional testing with an alternate test  methodology [phone]) is advised. The SARS-CoV-2 RNA is generally  detectable in upper and lower respiratory sp ecimens during the acute  phase of infection. The expected result is  Negative. Fact Sheet for Patients:  StrictlyIdeas.no Fact Sheet for Healthcare Providers: BankingDealers.co.za This test is not yet approved or cleared by the [place] FDA and has been authorized for detection and/or diagnosis of SARS-CoV-2 by FDA under an Emergency Use Authorization (EUA).  This EUA will remain in effect (meaning this test can be used) for the duration of the COVID-19 declaration under Section 564(b)(1) of the Act, 21 U.S.C. section 360bbb-3(b)(1), unless the authorization is terminated or revoked sooner. Performed at [facility], [address] [place], [place] [address]   Respiratory Panel by PCR     Status: None   Collection Time: 10/11/19  2:25 AM   Specimen: Nasopharyngeal Swab; Respiratory  Result Value Ref Range Status   Adenovirus NOT DETECTED NOT DETECTED Final   Coronavirus 229E NOT DETECTED NOT DETECTED Final    Comment: (NOTE) The Coronavirus on the Respiratory Panel, DOES NOT test for the novel  Coronavirus (2019 nCoV)    Coronavirus HKU1 NOT DETECTED NOT DETECTED Final   Coronavirus NL63 NOT DETECTED NOT DETECTED Final   Coronavirus OC43 NOT DETECTED NOT DETECTED Final   Metapneumovirus NOT DETECTED NOT DETECTED Final   Rhinovirus / Enterovirus NOT DETECTED NOT DETECTED Final   Influenza A NOT DETECTED NOT DETECTED Final   Influenza B NOT DETECTED NOT DETECTED Final   Parainfluenza Virus 1 NOT DETECTED NOT DETECTED Final   Parainfluenza Virus 2 NOT DETECTED NOT DETECTED Final   Parainfluenza Virus 3 NOT DETECTED NOT DETECTED Final   Parainfluenza Virus 4 NOT DETECTED NOT DETECTED Final   Respiratory Syncytial Virus NOT DETECTED NOT DETECTED Final   Bordetella pertussis NOT DETECTED NOT DETECTED Final   Chlamydophila pneumoniae NOT DETECTED NOT DETECTED Final   Mycoplasma pneumoniae NOT DETECTED NOT DETECTED Final    Comment: Performed at [facility], [place]. [address]  [place], [place] [address]  MRSA PCR Screening     Status: None   Collection Time: 10/11/19  2:25 AM   Specimen: Nasopharyngeal  Result  Value Ref Range Status   MRSA by PCR NEGATIVE NEGATIVE Final    Comment:        The GeneXpert MRSA Assay (FDA approved for NASAL specimens only), is one component of a comprehensive MRSA colonization surveillance program. It is not intended to diagnose MRSA infection nor to guide or monitor treatment for MRSA infections. Performed at [facility], [place] [address], [place], [place] [address]   SARS CORONAVIRUS 2 (TAT 6-24 HRS) Nasopharyngeal Nasopharyngeal Swab     Status: Abnormal   Collection Time: 10/19/19  5:27 PM   Specimen: Nasopharyngeal Swab  Result Value Ref Range Status   SARS Coronavirus 2 POSITIVE (A) NEGATIVE Final    Comment: RESULT CALLED TO, READ BACK BY AND VERIFIED WITH: H.BUYKONG,RN 0103 10/20/19 G.MCADOO (NOTE) SARS-CoV-2 target nucleic acids are DETECTED. The SARS-CoV-2 RNA is generally detectable in upper and lower respiratory specimens during the acute phase of infection. Positive results are indicative of the presence of SARS-CoV-2 RNA. Clinical correlation with patient history and other diagnostic information is  necessary to determine patient infection status. Positive results do not rule out bacterial infection or co-infection with other viruses.  The expected result is Negative. Fact Sheet for Patients: SugarRoll.be Fact Sheet for Healthcare Providers: https://www.woods-mathews.com/ This test is not yet approved or cleared by the [place] FDA and  has been authorized for detection and/or diagnosis of SARS-CoV-2 by FDA under an Emergency Use Authorization (EUA). This EUA will remain  in effect (meaning this test can be used) for the  duration of the COVID-19 declaration under Section 564(b)(1) of the Act, 21 U.S.C. section 360bbb-3(b)(1), unless the authorization is  terminated or revoked sooner. Performed at [facility], [place] [address], [place], [place] [address]       Radiology Studies: No results found.    Scheduled Meds: . ARIPiprazole  2 mg Per Tube Daily  . aspirin EC  81 mg Oral Daily  . chlorhexidine  15 mL Mouth Rinse BID  . chlorhexidine gluconate (MEDLINE KIT)  15 mL Mouth Rinse BID  . Chlorhexidine Gluconate Cloth  6 each Topical Daily  . enoxaparin (LOVENOX) injection  40 mg Subcutaneous Q24H  . levothyroxine  125 mcg Per Tube Q0600  . losartan  12.5 mg Oral Daily  . mouth rinse  15 mL Mouth Rinse q12n4p  . metoprolol succinate  12.5 mg Oral Daily  . pantoprazole sodium  40 mg Per Tube Daily  . pravastatin  20 mg Oral Daily  . sertraline  100 mg Per Tube Daily   Continuous Infusions: . sodium chloride 10 mL/hr at 10/12/19 0000     LOS: 9 days      Time spent: 35 minutes   Toney Lizaola, DO [facility] 10/20/2019, 9:24 AM   Available via Epic secure chat 7am-7pm After these hours, please refer to coverage provider listed on [email or web address]

## 2019-10-21 LAB — COMPREHENSIVE METABOLIC PANEL
ALT: 150 U/L — ABNORMAL HIGH (ref 0–44)
AST: 125 U/L — ABNORMAL HIGH (ref 15–41)
Albumin: 3.1 g/dL — ABNORMAL LOW (ref 3.5–5.0)
Alkaline Phosphatase: 53 U/L (ref 38–126)
Anion gap: 15 (ref 5–15)
BUN: 26 mg/dL — ABNORMAL HIGH (ref 8–23)
CO2: 21 mmol/L — ABNORMAL LOW (ref 22–32)
Calcium: 8.5 mg/dL — ABNORMAL LOW (ref 8.9–10.3)
Chloride: 111 mmol/L (ref 98–111)
Creatinine, Ser: 0.64 mg/dL (ref 0.44–1.00)
GFR calc Af Amer: >60 mL/min (ref >60)
GFR calc non Af Amer: >60 mL/min (ref >60)
Glucose, Bld: 108 mg/dL — ABNORMAL HIGH (ref 70–99)
Potassium: 3.6 mmol/L (ref 3.5–5.1)
Sodium: 147 mmol/L — ABNORMAL HIGH (ref 135–145)
Total Bilirubin: 0.7 mg/dL (ref 0.3–1.2)
Total Protein: 5.9 g/dL — ABNORMAL LOW (ref 6.5–8.1)

## 2019-10-21 LAB — GLUCOSE, CAPILLARY
Glucose-Capillary: 101 mg/dL — ABNORMAL HIGH (ref 70–99)
Glucose-Capillary: 104 mg/dL — ABNORMAL HIGH (ref 70–99)
Glucose-Capillary: 109 mg/dL — ABNORMAL HIGH (ref 70–99)
Glucose-Capillary: 110 mg/dL — ABNORMAL HIGH (ref 70–99)
Glucose-Capillary: 121 mg/dL — ABNORMAL HIGH (ref 70–99)
Glucose-Capillary: 189 mg/dL — ABNORMAL HIGH (ref 70–99)
Glucose-Capillary: 68 mg/dL — ABNORMAL LOW (ref 70–99)
Glucose-Capillary: 71 mg/dL (ref 70–99)
Glucose-Capillary: 90 mg/dL (ref 70–99)

## 2019-10-21 MED ORDER — ARIPIPRAZOLE 2 MG PO TABS
2.0000 mg | ORAL_TABLET | Freq: Every day | ORAL | Status: DC
  Administered 2019-10-22 – 2019-10-24 (×3): 2 mg
  Filled 2019-10-21 (×6): qty 1

## 2019-10-21 NOTE — Progress Notes (Signed)
PROGRESS NOTE    Hannah Galvan  BMW:[id number] DOB: August 15, 1931 DOA: 10/11/2019 PCP: Susy Frizzle, MD     Brief Narrative:  Hannah Galvan is a 85 year old female with extensive past medical history includingbut not limited to hypertrophic obstructive cardiomyopathy andadvanced dementia who to the emergency department via EMS from local skilled nursing facility in acuterespiratory distress. According to chart review patient was found on a nightly rounds with increased respiratory distress and work of breathing with an oxygen saturation of 60% on room air.Chest x-ray with signs of early opacities concerning for pulmonary edema versus multifocal infection. While in the emergency department due to concern for COVID-19 patient was removed from NIV therapy with eventual return of increased work of breathing and signs of respiratory fatigue prompting need for endotracheal intubation.  11/18 Patient remained intubated and found to have an EF of 25% from 55%. 11/19 Off vasopressors however remained intubated as she failed SBT 11/20 Diuresed well 11/21 Extubated   New events last 24 hours / Subjective: Patient is confused.  Unable to obtain any history from the patient.  Per nursing staff no overnight events. Assessment & Plan:   Active Problems:   Hypokalemia   Alzheimer's dementia without behavioral disturbance (HCC)   HOCM (hypertrophic obstructive cardiomyopathy) (HCC)   Multifocal pneumonia   Acute respiratory failure with hypoxia (HCC)   Heart failure with reduced ejection fraction (HCC)   ##Acute systolic heart failure -This is new finding as patient was having normal ejection fraction on prior echocardiograms. -Ejection fraction 25 to 30%. -No clinically volume overload at this time. -Appropriately diuresed; cardiology service was consulted and there was concern for NSTEMI.  Patient found not a good candidate for further work-up and recommendations given to pursued medical  management only.  -Patient completed 48 hours of IV heparin and since then has been started on low-dose losartan, lopressor, 81 mg of aspirin and statins   ##Acute hypoxic respiratory failure secondary aspiration pneumonia, COVID-19, CHF -Patient's hypoxic failure was thought to be secondary to acute on chronic heart failure and aspiration pneumonia, COVID-19 pneumonia -Patient required intubation, extubated on 10/18/2019 -RVP negative   -Blood culture negative  -Covid negative  -Currently saturating well on 1-2 L [place] -Patient has completed course of antibiotics for pneumonia on 10/16/19  ##COVID-19 pneumonia -Patient was tested x3 in the past was negative, came back to be positive on 10/19/2019 -Start the patient on dexamethasone, remdesivir -Patient is moved to Covid unit to continue with the contact, airborne precautions.  ##Multifocal pneumonia COVID-19 pneumonia -Continue with remdesivir, dexamethasone  ##Septic shock -Secondary to pneumonia -Completed the course of the antibiotics -resolved  ##Congestive heart failure systolic acute on chronic -Echocardiogram done from 10/11/2019 showed EF of 25 to 30%.  Akinesis of the mid/distal anteroseptal wall and apex with the overall severe LV dysfunction.  Hypertension - continue with metoprolol, losartan  ##Hypokalemia -Replaced, normalized  ##Hypertrophic obstructive cardiomyopathy - hold Lasix, as patient's sodium is trending up  ##Elevated troponins -Patient is evaluated by cardiology, felt to be from demand ischemia -No further work-up  ##Dysphagia -Modified barium swallow-recommended pured diet with the nectar thick liquids, aspiration precautions with the seated upright at 90 degrees. -Continue with speech therapy  ##Alzheimer's dementia  -Patient will be discharged to skilled nursing facility memory unit -Continue Zoloft and abilify  ##Hypothyroidism -Continue synthroid  ##GERD -Continue PPI    DVT  prophylaxis: Lovenox  Code Status: DNR  Family Communication: None at bedside  Disposition Plan: Insurance auth pending,  Covid test ordered pending SNF placement   Consultants:   Cardiology   Antimicrobials:  Anti-infectives (From admission, onward)   Start     Dose/Rate Route Frequency Ordered Stop   10/21/19 1500  remdesivir 100 mg in sodium chloride 0.9 % 250 mL IVPB     100 mg 500 mL/hr over 30 Minutes Intravenous Every 24 hours 10/20/19 1402     10/20/19 1500  remdesivir 200 mg in sodium chloride 0.9 % 250 mL IVPB     200 mg 500 mL/hr over 30 Minutes Intravenous Once 10/20/19 1402 10/20/19 1741   10/13/19 0600  vancomycin (VANCOCIN) 1,250 mg in sodium chloride 0.9 % 250 mL IVPB  Status:  Discontinued     1,250 mg 166.7 mL/hr over 90 Minutes Intravenous Every 48 hours 10/11/19 0311 10/12/19 1230   10/11/19 1200  ceFEPIme (MAXIPIME) 2 g in sodium chloride 0.9 % 100 mL IVPB     2 g 200 mL/hr over 30 Minutes Intravenous Every 12 hours 10/11/19 0311 10/14/19 1248   10/11/19 0500  doxycycline (VIBRAMYCIN) 100 mg in sodium chloride 0.9 % 250 mL IVPB     100 mg 125 mL/hr over 120 Minutes Intravenous Every 12 hours 10/11/19 0417 10/14/19 1849   10/11/19 0100  vancomycin (VANCOCIN) IVPB 1000 mg/200 mL premix  Status:  Discontinued     1,000 mg 200 mL/hr over 60 Minutes Intravenous  Once 10/11/19 0050 10/11/19 0055   10/11/19 0100  ceFEPIme (MAXIPIME) 2 g in sodium chloride 0.9 % 100 mL IVPB     2 g 200 mL/hr over 30 Minutes Intravenous  Once 10/11/19 0050 10/11/19 0215   10/11/19 0100  vancomycin (VANCOCIN) 1,250 mg in sodium chloride 0.9 % 250 mL IVPB     1,250 mg 166.7 mL/hr over 90 Minutes Intravenous  Once 10/11/19 0056 10/11/19 0440       Objective: Vitals:   10/20/19 1618 10/20/19 2240 10/21/19 0500 10/21/19 0756  BP: (!) 155/90 (!) 147/83  (!) 174/88  Pulse: 91 81  70  Resp: 16   16  Temp: 99.4 F (37.4 C) 99.3 F (37.4 C)  98.1 F (36.7 C)  TempSrc: Oral Oral   Oral  SpO2: 93% 97%  91%  Weight:   66.5 kg   Height:        Intake/Output Summary (Last 24 hours) at 10/21/2019 0910 Last data filed at 10/21/2019 0348 Gross per 24 hour  Intake 100 ml  Output 200 ml  Net -100 ml   Filed Weights   10/19/19 0434 10/20/19 0325 10/21/19 0500  Weight: 65 kg 66.6 kg 66.5 kg    Examination:  General exam: Appears calm and comfortable  Respiratory system: Clear to auscultation anteriorly. Respiratory effort normal. No respiratory distress. No conversational dyspnea. On 2L [place] O2 this morning  Cardiovascular system: S1 & S2 heard, RRR. No murmurs. No pedal edema. Gastrointestinal system: Abdomen is nondistended, soft and nontender. Normal bowel sounds heard. Central nervous system: Alert Extremities: Symmetric in appearance  Skin: No rashes, lesions or ulcers on exposed skin  Psychiatry: Dementia   Data Reviewed: I have personally reviewed following labs and imaging studies  CBC: Recent Labs  Lab 10/15/19 0330 10/16/19 0520 10/18/19 0211  WBC 8.2 8.3 6.6  HGB 12.2 12.1 12.2  HCT 36.3 36.0 37.4  MCV 95.8 96.3 97.4  PLT 157 172 034   Basic Metabolic Panel: Recent Labs  Lab 10/14/19 1714 10/15/19 0330 10/16/19 0520 10/17/19 7425 10/18/19 0211 10/21/19 0444  NA 140 139 140 140 139 147*  K 4.1 3.9 3.4* 2.9* 4.3 3.6  CL 108 107 105 106 109 111  CO2 20* 20* _0 21*  GLUCOSE 91 91 101* 134* 142* 108*  BUN 29* 24* _1 26*  CREATININE 0.81 0.72 0.67 0.63 0.72 0.64  CALCIUM 8.6* 8.8* 8.7* 8.6* 8.3* 8.5*  MG 2.0  --   --   --  2.0  --   PHOS 2.8  --   --   --   --   --    GFR: Estimated Creatinine Clearance: 45.7 mL/min (by C-G formula based on SCr of 0.64 mg/dL). Liver Function Tests: Recent Labs  Lab 10/21/19 0444  AST 125*  ALT 150*  ALKPHOS 53  BILITOT 0.7  PROT 5.9*  ALBUMIN 3.1*   No results for input(s): LIPASE, AMYLASE in the last 168 hours. No results for input(s): AMMONIA in the last 168 hours. Coagulation  Profile: No results for input(s): INR, PROTIME in the last 168 hours. Cardiac Enzymes: No results for input(s): CKTOTAL, CKMB, CKMBINDEX, TROPONINI in the last 168 hours. BNP (last 3 results) No results for input(s): PROBNP in the last 8760 hours. HbA1C: No results for input(s): HGBA1C in the last 72 hours. CBG: Recent Labs  Lab 10/20/19 0349 10/20/19 0822 10/20/19 1115 10/21/19 0114 10/21/19 0459  GLUCAP 102* 71 113* 101* 90   Lipid Profile: No results for input(s): CHOL, HDL, LDLCALC, TRIG, CHOLHDL, LDLDIRECT in the last 72 hours. Thyroid Function Tests: No results for input(s): TSH, T4TOTAL, FREET4, T3FREE, THYROIDAB in the last 72 hours. Anemia Panel: No results for input(s): VITAMINB12, FOLATE, FERRITIN, TIBC, IRON, RETICCTPCT in the last 72 hours. Sepsis Labs: No results for input(s): PROCALCITON, LATICACIDVEN in the last 168 hours.  Recent Results (from the past 240 hour(s))  SARS CORONAVIRUS 2 (TAT 6-24 HRS) Nasopharyngeal Nasopharyngeal Swab     Status: Abnormal   Collection Time: 10/19/19  5:27 PM   Specimen: Nasopharyngeal Swab  Result Value Ref Range Status   SARS Coronavirus 2 POSITIVE (A) NEGATIVE Final    Comment: RESULT CALLED TO, READ BACK BY AND VERIFIED WITH: H.BUYKONG,RN 0103 10/20/19 G.MCADOO (NOTE) SARS-CoV-2 target nucleic acids are DETECTED. The SARS-CoV-2 RNA is generally detectable in upper and lower respiratory specimens during the acute phase of infection. Positive results are indicative of the presence of SARS-CoV-2 RNA. Clinical correlation with patient history and other diagnostic information is  necessary to determine patient infection status. Positive results do not rule out bacterial infection or co-infection with other viruses.  The expected result is Negative. Fact Sheet for Patients: SugarRoll.be Fact Sheet for Healthcare Providers: https://www.woods-mathews.com/ This test is not yet approved  or cleared by the [place] FDA and  has been authorized for detection and/or diagnosis of SARS-CoV-2 by FDA under an Emergency Use Authorization (EUA). This EUA will remain  in effect (meaning this test can be used) for the  duration of the COVID-19 declaration under Section 564(b)(1) of the Act, 21 U.S.C. section 360bbb-3(b)(1), unless the authorization is terminated or revoked sooner. Performed at [facility], [place] [address] [place], [place] [address]       Radiology Studies: No results found.    Scheduled Meds: . ARIPiprazole  2 mg Per Tube Daily  . aspirin EC  81 mg Oral Daily  . chlorhexidine  15 mL Mouth Rinse BID  . chlorhexidine gluconate (MEDLINE KIT)  15 mL Mouth Rinse BID  . Chlorhexidine Gluconate  Cloth  6 each Topical Daily  . dexamethasone (DECADRON) injection  6 mg Intravenous Q24H  . enoxaparin (LOVENOX) injection  40 mg Subcutaneous Q24H  . levothyroxine  125 mcg Per Tube Q0600  . losartan  12.5 mg Oral Daily  . mouth rinse  15 mL Mouth Rinse q12n4p  . metoprolol succinate  12.5 mg Oral Daily  . pantoprazole sodium  40 mg Per Tube Daily  . pravastatin  20 mg Oral Daily  . sertraline  100 mg Per Tube Daily   Continuous Infusions: . sodium chloride 10 mL/hr at 10/12/19 0000  . remdesivir 100 mg in NS 250 mL       LOS: 10 days      Time spent: 35 minutes   Etola Mull, DO [facility] 10/21/2019, 9:10 AM   Available via Epic secure chat 7am-7pm After these hours, please refer to coverage provider listed on [email or web address]

## 2019-10-21 NOTE — Progress Notes (Signed)
Physical Therapy Treatment Patient Details Name: Hannah Galvan MRN: [id number] DOB: 07-06-1931 Today's Date: 10/21/2019    History of Present Illness Pt is an 85 yo female s/p respiratory distress found to have multifocal PNA and pulmonary edema. Intubated 11/17-11/20. Pt found to be Covid + on 11/25.  PMHx: br C, CKD stage III, dementia, HTN.    PT Comments    Pt making slow progress. Pleasantly confused. Continue to recommend return to SNF.   Follow Up Recommendations  SNF     Equipment Recommendations  None recommended by PT(allow SNF to decide)    Recommendations for Other Services       Precautions / Restrictions Precautions Precautions: Fall;Other (comment) Restrictions Weight Bearing Restrictions: No    Mobility  Bed Mobility Overal bed mobility: Needs Assistance Bed Mobility: Supine to Sit;Sit to Supine     Supine to sit: Max assist Sit to supine: Max assist   General bed mobility comments: Assist to bring legs off of bed, elevate trunk into sitting, and bring hips to EOB. Assist to lower trunk and bring legs back up into bed.  Transfers                 General transfer comment: IV team in to place IV  Ambulation/Gait                 Stairs             Wheelchair Mobility    Modified Rankin (Stroke Patients Only)       Balance Overall balance assessment: Needs assistance Sitting-balance support: Bilateral upper extremity supported;Feet unsupported Sitting balance-Leahy Scale: Poor Sitting balance - Comments: UE support.                                    Cognition Arousal/Alertness: Awake/alert Behavior During Therapy: Flat affect Overall Cognitive Status: No family/caregiver present to determine baseline cognitive functioning                                 General Comments: dementia at baseline. Conversant.       Exercises      General Comments        Pertinent Vitals/Pain Pain  Assessment: No/denies pain    Home Living                      Prior Function            PT Goals (current goals can now be found in the care plan section) Progress towards PT goals: Progressing toward goals    Frequency    Min 2X/week      PT Plan Current plan remains appropriate    Co-evaluation              AM-PAC PT "6 Clicks" Mobility   Outcome Measure  Help needed turning from your back to your side while in a flat bed without using bedrails?: A Lot Help needed moving from lying on your back to sitting on the side of a flat bed without using bedrails?: Total Help needed moving to and from a bed to a chair (including a wheelchair)?: Total Help needed standing up from a chair using your arms (e.g., wheelchair or bedside chair)?: Total Help needed to walk in hospital room?: Total Help needed climbing 3-5 steps with a  railing? : Total 6 Click Score: 7    End of Session   Activity Tolerance: Patient tolerated treatment well Patient left: in bed;with call bell/phone within reach;Other (comment)(IV nurse present and to reset bed alarm) Nurse Communication: Mobility status PT Visit Diagnosis: Other abnormalities of gait and mobility (R26.89);Muscle weakness (generalized) (M62.81)     Time: [id number] PT Time Calculation (min) (ACUTE ONLY): 14 min  Charges:  $Therapeutic Activity: 8-22 mins                     [place] Pager [phone] Office [place] 10/21/2019, 4:46 PM

## 2019-10-21 NOTE — Progress Notes (Signed)
Patient complaining of vaginal burning. Labia red and irritated. Discontinued purewick and applied powder to site. Paged MD with update. Will continue to monitor.

## 2019-10-22 LAB — COMPREHENSIVE METABOLIC PANEL
ALT: 130 U/L — ABNORMAL HIGH (ref 0–44)
AST: 95 U/L — ABNORMAL HIGH (ref 15–41)
Albumin: 3.1 g/dL — ABNORMAL LOW (ref 3.5–5.0)
Alkaline Phosphatase: 55 U/L (ref 38–126)
Anion gap: 14 (ref 5–15)
BUN: 27 mg/dL — ABNORMAL HIGH (ref 8–23)
CO2: 21 mmol/L — ABNORMAL LOW (ref 22–32)
Calcium: 8.6 mg/dL — ABNORMAL LOW (ref 8.9–10.3)
Chloride: 109 mmol/L (ref 98–111)
Creatinine, Ser: 0.63 mg/dL (ref 0.44–1.00)
GFR calc Af Amer: >60 mL/min (ref >60)
GFR calc non Af Amer: >60 mL/min (ref >60)
Glucose, Bld: 92 mg/dL (ref 70–99)
Potassium: 3.4 mmol/L — ABNORMAL LOW (ref 3.5–5.1)
Sodium: 144 mmol/L (ref 135–145)
Total Bilirubin: 0.7 mg/dL (ref 0.3–1.2)
Total Protein: 6 g/dL — ABNORMAL LOW (ref 6.5–8.1)

## 2019-10-22 LAB — GLUCOSE, CAPILLARY
Glucose-Capillary: 86 mg/dL (ref 70–99)
Glucose-Capillary: 89 mg/dL (ref 70–99)
Glucose-Capillary: 84 mg/dL (ref 70–99)
Glucose-Capillary: 121 mg/dL — ABNORMAL HIGH (ref 70–99)
Glucose-Capillary: 131 mg/dL — ABNORMAL HIGH (ref 70–99)

## 2019-10-22 MED ORDER — POTASSIUM CHLORIDE CRYS ER 20 MEQ PO TBCR
40.0000 meq | EXTENDED_RELEASE_TABLET | Freq: Once | ORAL | Status: DC
  Filled 2019-10-22: qty 2

## 2019-10-22 NOTE — Progress Notes (Signed)
PROGRESS NOTE    Hannah Galvan  MWN:[id number] DOB: 1931/11/08 DOA: 10/11/2019 PCP: Susy Frizzle, MD     Brief Narrative:  Hannah Galvan is a 85 year old female with extensive past medical history includingbut not limited to hypertrophic obstructive cardiomyopathy andadvanced dementia who to the emergency department via EMS from local skilled nursing facility in acuterespiratory distress. According to chart review patient was found on a nightly rounds with increased respiratory distress and work of breathing with an oxygen saturation of 60% on room air.Chest x-ray with signs of early opacities concerning for pulmonary edema versus multifocal infection. While in the emergency department due to concern for COVID-19 patient was removed from NIV therapy with eventual return of increased work of breathing and signs of respiratory fatigue prompting need for endotracheal intubation.  11/18 Patient remained intubated and found to have an EF of 25% from 55%. 11/19 Off vasopressors however remained intubated as she failed SBT 11/20 Diuresed well 11/21 Extubated   New events last 24 hours / Subjective: Patient is confused.  Unable to obtain any history from the patient.  Per nursing staff patient had 1 loose stool.  Drank Ensure.   ate a few bites with the help of the nurses.  Assessment & Plan:   Active Problems:   Hypokalemia   Alzheimer's dementia without behavioral disturbance (HCC)   HOCM (hypertrophic obstructive cardiomyopathy) (HCC)   Multifocal pneumonia   Acute respiratory failure with hypoxia (HCC)   Heart failure with reduced ejection fraction (HCC)   ##Acute systolic heart failure -This is new finding as patient was having normal ejection fraction on prior echocardiograms. -Ejection fraction 25 to 30%. -No clinically volume overload at this time. -Appropriately diuresed; cardiology service was consulted and there was concern for NSTEMI.  Patient found not a good  candidate for further work-up and recommendations given to pursued medical management only.  -Patient completed 48 hours of IV heparin and since then has been started on low-dose losartan, lopressor, 81 mg of aspirin and statins   ##Acute hypoxic respiratory failure secondary aspiration pneumonia, COVID-19, CHF -Patient's hypoxic failure was thought to be secondary to acute on chronic heart failure and aspiration pneumonia, COVID-19 pneumonia -Patient required intubation, extubated on 10/18/2019 -RVP negative   -Blood culture negative  -Covid negative  -Currently saturating well on 1-2 L [place] -Patient has completed course of antibiotics for pneumonia on 10/16/19  ##COVID-19 pneumonia -Patient was tested x3 in the past was negative, came back to be positive on 10/19/2019 -Start the patient on dexamethasone, remdesivir -Patient is moved to Covid unit to continue with the contact, airborne precautions.  ##Multifocal pneumonia COVID-19 pneumonia -Continue with remdesivir, dexamethasone  ##Septic shock -Secondary to pneumonia -Completed the course of the antibiotics -resolved  ##Diarrhea -Per staff patient only had one bowel movement since this morning, closely follow-up   ##Congestive heart failure systolic acute on chronic -Echocardiogram done from 10/11/2019 showed EF of 25 to 30%.  Akinesis of the mid/distal anteroseptal wall and apex with the overall severe LV dysfunction.  ##Hypertension - continue with metoprolol, losartan  ##Hypokalemia -Replaced, normalized  ##Hypertrophic obstructive cardiomyopathy - hold Lasix, as patient's sodium is trending up  ##Elevated troponins -Patient is evaluated by cardiology, felt to be from demand ischemia -No further work-up  ##Dysphagia -Modified barium swallow-recommended pured diet with the nectar thick liquids, aspiration precautions with the seated upright at 90 degrees. -Continue with speech therapy  ##Alzheimer's dementia    -Patient will be discharged to skilled nursing facility memory  unit -Continue Zoloft and abilify  ##Hypothyroidism -Continue synthroid  ##GERD -Continue PPI    DVT prophylaxis: Lovenox  Code Status: DNR  Family Communication: Updated patient's son 10/22/2019 over the phone. Disposition Plan: Insurance auth pending, Covid test ordered pending SNF placement   Consultants:   Cardiology   Antimicrobials:  Anti-infectives (From admission, onward)   Start     Dose/Rate Route Frequency Ordered Stop   10/21/19 1500  remdesivir 100 mg in sodium chloride 0.9 % 250 mL IVPB     100 mg 500 mL/hr over 30 Minutes Intravenous Every 24 hours 10/20/19 1402 10/25/19 1459   10/20/19 1500  remdesivir 200 mg in sodium chloride 0.9 % 250 mL IVPB     200 mg 500 mL/hr over 30 Minutes Intravenous Once 10/20/19 1402 10/20/19 1741   10/13/19 0600  vancomycin (VANCOCIN) 1,250 mg in sodium chloride 0.9 % 250 mL IVPB  Status:  Discontinued     1,250 mg 166.7 mL/hr over 90 Minutes Intravenous Every 48 hours 10/11/19 0311 10/12/19 1230   10/11/19 1200  ceFEPIme (MAXIPIME) 2 g in sodium chloride 0.9 % 100 mL IVPB     2 g 200 mL/hr over 30 Minutes Intravenous Every 12 hours 10/11/19 0311 10/14/19 1248   10/11/19 0500  doxycycline (VIBRAMYCIN) 100 mg in sodium chloride 0.9 % 250 mL IVPB     100 mg 125 mL/hr over 120 Minutes Intravenous Every 12 hours 10/11/19 0417 10/14/19 1849   10/11/19 0100  vancomycin (VANCOCIN) IVPB 1000 mg/200 mL premix  Status:  Discontinued     1,000 mg 200 mL/hr over 60 Minutes Intravenous  Once 10/11/19 0050 10/11/19 0055   10/11/19 0100  ceFEPIme (MAXIPIME) 2 g in sodium chloride 0.9 % 100 mL IVPB     2 g 200 mL/hr over 30 Minutes Intravenous  Once 10/11/19 0050 10/11/19 0215   10/11/19 0100  vancomycin (VANCOCIN) 1,250 mg in sodium chloride 0.9 % 250 mL IVPB     1,250 mg 166.7 mL/hr over 90 Minutes Intravenous  Once 10/11/19 0056 10/11/19 0440        Objective: Vitals:   10/21/19 1522 10/21/19 2347 10/22/19 0553 10/22/19 0831  BP: 138/73 (!) 157/79  (!) 154/80  Pulse: 70 72  69  Resp:  20    Temp: 98.5 F (36.9 C) 98.7 F (37.1 C)  98.9 F (37.2 C)  TempSrc: Oral Oral  Oral  SpO2: 96% 90%  91%  Weight:   71.6 kg   Height:        Intake/Output Summary (Last 24 hours) at 10/22/2019 1558 Last data filed at 10/21/2019 1930 Gross per 24 hour  Intake --  Output 100 ml  Net -100 ml   Filed Weights   10/20/19 0325 10/21/19 0500 10/22/19 0553  Weight: 66.6 kg 66.5 kg 71.6 kg    Examination:  General exam: Appears calm and comfortable  Respiratory system: Clear to auscultation anteriorly. Respiratory effort normal. No respiratory distress. No conversational dyspnea. On 2L [place] O2 this morning  Cardiovascular system: S1 & S2 heard, RRR. No murmurs. No pedal edema. Gastrointestinal system: Abdomen is nondistended, soft and nontender. Normal bowel sounds heard. Central nervous system: Alert Extremities: Symmetric in appearance  Skin: No rashes, lesions or ulcers on exposed skin  Psychiatry: Dementia   Data Reviewed: I have personally reviewed following labs and imaging studies  CBC: Recent Labs  Lab 10/16/19 0520 10/18/19 0211  WBC 8.3 6.6  HGB 12.1 12.2  HCT 36.0 37.4  MCV 96.3 97.4  PLT 172 967   Basic Metabolic Panel: Recent Labs  Lab 10/16/19 0520 10/17/19 0907 10/18/19 0211 10/21/19 0444 10/22/19 0435  NA 140 140 139 147* 144  K 3.4* 2.9* 4.3 3.6 3.4*  CL 105 106 109 111 109  CO2 '26 24 22 '$ 21* 21*  GLUCOSE 101* 134* 142* 108* 92  BUN '20 20 22 '$ 26* 27*  CREATININE 0.67 0.63 0.72 0.64 0.63  CALCIUM 8.7* 8.6* 8.3* 8.5* 8.6*  MG  --   --  2.0  --   --    GFR: Estimated Creatinine Clearance: 47.3 mL/min (by C-G formula based on SCr of 0.63 mg/dL). Liver Function Tests: Recent Labs  Lab 10/21/19 0444 10/22/19 0435  AST 125* 95*  ALT 150* 130*  ALKPHOS 53 55  BILITOT 0.7 0.7  PROT 5.9* 6.0*   ALBUMIN 3.1* 3.1*   No results for input(s): LIPASE, AMYLASE in the last 168 hours. No results for input(s): AMMONIA in the last 168 hours. Coagulation Profile: No results for input(s): INR, PROTIME in the last 168 hours. Cardiac Enzymes: No results for input(s): CKTOTAL, CKMB, CKMBINDEX, TROPONINI in the last 168 hours. BNP (last 3 results) No results for input(s): PROBNP in the last 8760 hours. HbA1C: No results for input(s): HGBA1C in the last 72 hours. CBG: Recent Labs  Lab 10/21/19 2038 10/21/19 2343 10/22/19 0431 10/22/19 0826 10/22/19 1216  GLUCAP 121* 104* 86 89 84   Lipid Profile: No results for input(s): CHOL, HDL, LDLCALC, TRIG, CHOLHDL, LDLDIRECT in the last 72 hours. Thyroid Function Tests: No results for input(s): TSH, T4TOTAL, FREET4, T3FREE, THYROIDAB in the last 72 hours. Anemia Panel: No results for input(s): VITAMINB12, FOLATE, FERRITIN, TIBC, IRON, RETICCTPCT in the last 72 hours. Sepsis Labs: No results for input(s): PROCALCITON, LATICACIDVEN in the last 168 hours.  Recent Results (from the past 240 hour(s))  SARS CORONAVIRUS 2 (TAT 6-24 HRS) Nasopharyngeal Nasopharyngeal Swab     Status: Abnormal   Collection Time: 10/19/19  5:27 PM   Specimen: Nasopharyngeal Swab  Result Value Ref Range Status   SARS Coronavirus 2 POSITIVE (A) NEGATIVE Final    Comment: RESULT CALLED TO, READ BACK BY AND VERIFIED WITH: H.BUYKONG,RN 0103 10/20/19 G.MCADOO (NOTE) SARS-CoV-2 target nucleic acids are DETECTED. The SARS-CoV-2 RNA is generally detectable in upper and lower respiratory specimens during the acute phase of infection. Positive results are indicative of the presence of SARS-CoV-2 RNA. Clinical correlation with patient history and other diagnostic information is  necessary to determine patient infection status. Positive results do not rule out bacterial infection or co-infection with other viruses.  The expected result is Negative. Fact Sheet for  Patients: SugarRoll.be Fact Sheet for Healthcare Providers: https://www.woods-mathews.com/ This test is not yet approved or cleared by the [place] FDA and  has been authorized for detection and/or diagnosis of SARS-CoV-2 by FDA under an Emergency Use Authorization (EUA). This EUA will remain  in effect (meaning this test can be used) for the  duration of the COVID-19 declaration under Section 564(b)(1) of the Act, 21 U.S.C. section 360bbb-3(b)(1), unless the authorization is terminated or revoked sooner. Performed at [facility], [place] [address] [place], [place] [address]       Radiology Studies: No results found.    Scheduled Meds:  ARIPiprazole  2 mg Per Tube QHS   aspirin EC  81 mg Oral Daily   chlorhexidine  15 mL Mouth Rinse BID   chlorhexidine gluconate (MEDLINE KIT)  15  mL Mouth Rinse BID   Chlorhexidine Gluconate Cloth  6 each Topical Daily   dexamethasone (DECADRON) injection  6 mg Intravenous Q24H   enoxaparin (LOVENOX) injection  40 mg Subcutaneous Q24H   levothyroxine  125 mcg Per Tube Q0600   losartan  12.5 mg Oral Daily   mouth rinse  15 mL Mouth Rinse q12n4p   metoprolol succinate  12.5 mg Oral Daily   pantoprazole sodium  40 mg Per Tube Daily   potassium chloride  40 mEq Oral Once   pravastatin  20 mg Oral Daily   sertraline  100 mg Per Tube Daily   Continuous Infusions:  sodium chloride 10 mL/hr at 10/12/19 0000   remdesivir 100 mg in NS 250 mL 100 mg (10/22/19 1427)     LOS: 11 days      Time spent: 35 minutes   Darryon Bastin, DO [facility] 10/22/2019, 3:58 PM   Available via Epic secure chat 7am-7pm After these hours, please refer to coverage provider listed on [email or web address]

## 2019-10-23 LAB — GLUCOSE, CAPILLARY
Glucose-Capillary: 107 mg/dL — ABNORMAL HIGH (ref 70–99)
Glucose-Capillary: 136 mg/dL — ABNORMAL HIGH (ref 70–99)
Glucose-Capillary: 137 mg/dL — ABNORMAL HIGH (ref 70–99)
Glucose-Capillary: 151 mg/dL — ABNORMAL HIGH (ref 70–99)
Glucose-Capillary: 178 mg/dL — ABNORMAL HIGH (ref 70–99)
Glucose-Capillary: 87 mg/dL (ref 70–99)
Glucose-Capillary: 92 mg/dL (ref 70–99)

## 2019-10-23 LAB — COMPREHENSIVE METABOLIC PANEL
ALT: 105 U/L — ABNORMAL HIGH (ref 0–44)
AST: 69 U/L — ABNORMAL HIGH (ref 15–41)
Albumin: 3.1 g/dL — ABNORMAL LOW (ref 3.5–5.0)
Alkaline Phosphatase: 57 U/L (ref 38–126)
Anion gap: 14 (ref 5–15)
BUN: 28 mg/dL — ABNORMAL HIGH (ref 8–23)
CO2: 19 mmol/L — ABNORMAL LOW (ref 22–32)
Calcium: 8.7 mg/dL — ABNORMAL LOW (ref 8.9–10.3)
Chloride: 113 mmol/L — ABNORMAL HIGH (ref 98–111)
Creatinine, Ser: 0.6 mg/dL (ref 0.44–1.00)
GFR calc Af Amer: >60 mL/min (ref >60)
GFR calc non Af Amer: >60 mL/min (ref >60)
Glucose, Bld: 84 mg/dL (ref 70–99)
Potassium: 3.3 mmol/L — ABNORMAL LOW (ref 3.5–5.1)
Sodium: 146 mmol/L — ABNORMAL HIGH (ref 135–145)
Total Bilirubin: 0.8 mg/dL (ref 0.3–1.2)
Total Protein: 5.9 g/dL — ABNORMAL LOW (ref 6.5–8.1)

## 2019-10-23 MED ORDER — POTASSIUM CHLORIDE 20 MEQ PO PACK
40.0000 meq | PACK | Freq: Once | ORAL | Status: DC
  Filled 2019-10-23: qty 2

## 2019-10-23 MED ORDER — POTASSIUM CHLORIDE 20 MEQ/15ML (10%) PO SOLN
40.0000 meq | Freq: Once | ORAL | Status: AC
  Administered 2019-10-23: 40 meq via ORAL
  Filled 2019-10-23: qty 30

## 2019-10-23 MED ORDER — POTASSIUM CHLORIDE CRYS ER 20 MEQ PO TBCR
40.0000 meq | EXTENDED_RELEASE_TABLET | Freq: Once | ORAL | Status: AC
  Administered 2019-10-23: 40 meq via ORAL
  Filled 2019-10-23: qty 2

## 2019-10-23 NOTE — Progress Notes (Signed)
Called report to [facility]. Gave report to Ragan whom was coverage for [facility].

## 2019-10-23 NOTE — Progress Notes (Addendum)
PROGRESS NOTE    Hannah Galvan  ZOX:[id number] DOB: 1931-09-06 DOA: 10/11/2019 PCP: Susy Frizzle, MD     Brief Narrative:  Hannah Galvan is a 85 year old female with extensive past medical history includingbut not limited to hypertrophic obstructive cardiomyopathy andadvanced dementia who to the emergency department via EMS from local skilled nursing facility in acuterespiratory distress. According to chart review patient was found on a nightly rounds with increased respiratory distress and work of breathing with an oxygen saturation of 60% on room air.Chest x-ray with signs of early opacities concerning for pulmonary edema versus multifocal infection. While in the emergency department due to concern for COVID-19 patient was removed from NIV therapy with eventual return of increased work of breathing and signs of respiratory fatigue prompting need for endotracheal intubation.  COVID-19 x3 came back to be negative.  However patient's test came back to be positive on 10/19/2019.  Patient is currently on 2 L of oxygen.  Receiving remdesivir, dexamethasone.  11/18 Patient remained intubated and found to have an EF of 25% from 55%. 11/19 Off vasopressors however remained intubated as she failed SBT 11/20 Diuresed well 11/21 Extubated   New events last 24 hours / Subjective: Patient is confused.  Unable to obtain any history from the patient.  Per nursing staff patient had 1 loose stool.  Drank Ensure.   ate a few bites with the help of the nurses.  Assessment & Plan:   Active Problems:   Hypokalemia   Alzheimer's dementia without behavioral disturbance (HCC)   HOCM (hypertrophic obstructive cardiomyopathy) (HCC)   Multifocal pneumonia   Acute respiratory failure with hypoxia (HCC)   Heart failure with reduced ejection fraction (HCC)   ##Acute systolic heart failure -This is new finding as patient was having normal ejection fraction on prior echocardiograms. -Ejection fraction  25 to 30%. -No clinically volume overload at this time. -Appropriately diuresed; cardiology service was consulted and there was concern for NSTEMI.  Patient found not a good candidate for further work-up and recommendations given to pursued medical management only.  -Patient completed 48 hours of IV heparin and since then has been started on low-dose losartan, lopressor, 81 mg of aspirin and statins   ##Acute hypoxic respiratory failure secondary aspiration pneumonia, COVID-19, CHF -Patient's hypoxic failure was thought to be secondary to acute on chronic heart failure and aspiration pneumonia, COVID-19 pneumonia -Patient required intubation, extubated on 10/18/2019 -RVP negative   -Blood culture negative  -Covid negative x3 -ve, tested positive on 10/19/2019 -Currently saturating well on 1-2 L [place] -Patient has completed course of antibiotics for pneumonia on 10/16/19  ##COVID-19 pneumonia -Patient was tested x3 in the past was negative, came back to be positive on 10/19/2019 - on dexamethasone, remdesivir 10/20/2019-10/24/2019. -Patient is moved to Covid unit to continue with the contact, airborne precautions.  ##Multifocal pneumonia COVID-19 pneumonia -Continue with remdesivir, dexamethasone -Currently on 2 L of oxygen through nasal cannula with good oxygen saturations  ##Septic shock -Secondary to pneumonia -Completed the course of the antibiotics -resolved  ##Diarrhea -Per staff patient only had one bowel movement since this morning, closely follow-up -Improved.   ##Congestive heart failure systolic acute on chronic -Echocardiogram done from 10/11/2019 showed EF of 25 to 30%.  Akinesis of the mid/distal anteroseptal wall and apex with the overall severe LV dysfunction.  ##Hypertension - continue with metoprolol, losartan  ##Hypokalemia -Replaced by mouth  ##Hypertrophic obstructive cardiomyopathy - hold Lasix, as patient's sodium is trending up  ##Elevated troponins  -Patient is  evaluated by cardiology, felt to be from demand ischemia -No further work-up  ##Dysphagia -Modified barium swallow-recommended pured diet with the nectar thick liquids, aspiration precautions with the seated upright at 90 degrees. -Continue with speech therapy  ##Alzheimer's dementia  -Patient will be discharged to skilled nursing facility memory unit -Continue Zoloft and abilify  ##Hypothyroidism -Continue synthroid  ##GERD -Continue PPI    DVT prophylaxis: Lovenox  Code Status: DNR  Family Communication: Updated patient's son 10/23/2019 over the phone. Disposition Plan: Transfer to skilled nursing facility once placement is available  Consultants:   Cardiology   Antimicrobials:  Anti-infectives (From admission, onward)   Start     Dose/Rate Route Frequency Ordered Stop   10/21/19 1500  remdesivir 100 mg in sodium chloride 0.9 % 250 mL IVPB     100 mg 500 mL/hr over 30 Minutes Intravenous Every 24 hours 10/20/19 1402 10/25/19 1459   10/20/19 1500  remdesivir 200 mg in sodium chloride 0.9 % 250 mL IVPB     200 mg 500 mL/hr over 30 Minutes Intravenous Once 10/20/19 1402 10/20/19 1741   10/13/19 0600  vancomycin (VANCOCIN) 1,250 mg in sodium chloride 0.9 % 250 mL IVPB  Status:  Discontinued     1,250 mg 166.7 mL/hr over 90 Minutes Intravenous Every 48 hours 10/11/19 0311 10/12/19 1230   10/11/19 1200  ceFEPIme (MAXIPIME) 2 g in sodium chloride 0.9 % 100 mL IVPB     2 g 200 mL/hr over 30 Minutes Intravenous Every 12 hours 10/11/19 0311 10/14/19 1248   10/11/19 0500  doxycycline (VIBRAMYCIN) 100 mg in sodium chloride 0.9 % 250 mL IVPB     100 mg 125 mL/hr over 120 Minutes Intravenous Every 12 hours 10/11/19 0417 10/14/19 1849   10/11/19 0100  vancomycin (VANCOCIN) IVPB 1000 mg/200 mL premix  Status:  Discontinued     1,000 mg 200 mL/hr over 60 Minutes Intravenous  Once 10/11/19 0050 10/11/19 0055   10/11/19 0100  ceFEPIme (MAXIPIME) 2 g in sodium chloride  0.9 % 100 mL IVPB     2 g 200 mL/hr over 30 Minutes Intravenous  Once 10/11/19 0050 10/11/19 0215   10/11/19 0100  vancomycin (VANCOCIN) 1,250 mg in sodium chloride 0.9 % 250 mL IVPB     1,250 mg 166.7 mL/hr over 90 Minutes Intravenous  Once 10/11/19 0056 10/11/19 0440       Objective: Vitals:   10/22/19 2253 10/23/19 0539 10/23/19 0800 10/23/19 1516  BP: (!) 170/73  137/88 (!) 165/124  Pulse: 68  74 67  Resp: 18     Temp: 98.7 F (37.1 C)  98.2 F (36.8 C) 98.2 F (36.8 C)  TempSrc: Oral  Oral Oral  SpO2: 93%  96% 100%  Weight:  65.1 kg    Height:        Intake/Output Summary (Last 24 hours) at 10/23/2019 1555 Last data filed at 10/22/2019 1632 Gross per 24 hour  Intake 250 ml  Output -  Net 250 ml   Filed Weights   10/21/19 0500 10/22/19 0553 10/23/19 0539  Weight: 66.5 kg 71.6 kg 65.1 kg    Examination:  General exam: Appears calm and comfortable  Respiratory system: Clear to auscultation anteriorly. Respiratory effort normal. No respiratory distress. No conversational dyspnea. On 2L [place] O2 this morning  Cardiovascular system: S1 & S2 heard, RRR. No murmurs. No pedal edema. Gastrointestinal system: Abdomen is nondistended, soft and nontender. Normal bowel sounds heard. Central nervous system: Alert Extremities: Symmetric in appearance  Skin: No rashes, lesions or ulcers on exposed skin  Psychiatry: Dementia   Data Reviewed: I have personally reviewed following labs and imaging studies  CBC: Recent Labs  Lab 10/18/19 0211  WBC 6.6  HGB 12.2  HCT 37.4  MCV 97.4  PLT 193   Basic Metabolic Panel: Recent Labs  Lab 10/17/19 0907 10/18/19 0211 10/21/19 0444 10/22/19 0435 10/23/19 0637  NA 140 139 147* 144 146*  K 2.9* 4.3 3.6 3.4* 3.3*  CL 106 109 111 109 113*  CO2 24 22 21* 21* 19*  GLUCOSE 134* 142* 108* 92 84  BUN 20 22 26* 27* 28*  CREATININE 0.63 0.72 0.64 0.63 0.60  CALCIUM 8.6* 8.3* 8.5* 8.6* 8.7*  MG  --  2.0  --   --   --    GFR:  Estimated Creatinine Clearance: 45.3 mL/min (by C-G formula based on SCr of 0.6 mg/dL). Liver Function Tests: Recent Labs  Lab 10/21/19 0444 10/22/19 0435 10/23/19 0637  AST 125* 95* 69*  ALT 150* 130* 105*  ALKPHOS 53 55 57  BILITOT 0.7 0.7 0.8  PROT 5.9* 6.0* 5.9*  ALBUMIN 3.1* 3.1* 3.1*   No results for input(s): LIPASE, AMYLASE in the last 168 hours. No results for input(s): AMMONIA in the last 168 hours. Coagulation Profile: No results for input(s): INR, PROTIME in the last 168 hours. Cardiac Enzymes: No results for input(s): CKTOTAL, CKMB, CKMBINDEX, TROPONINI in the last 168 hours. BNP (last 3 results) No results for input(s): PROBNP in the last 8760 hours. HbA1C: No results for input(s): HGBA1C in the last 72 hours. CBG: Recent Labs  Lab 10/23/19 0006 10/23/19 0413 10/23/19 0757 10/23/19 1209 10/23/19 1512  GLUCAP 107* 136* 87 92 137*   Lipid Profile: No results for input(s): CHOL, HDL, LDLCALC, TRIG, CHOLHDL, LDLDIRECT in the last 72 hours. Thyroid Function Tests: No results for input(s): TSH, T4TOTAL, FREET4, T3FREE, THYROIDAB in the last 72 hours. Anemia Panel: No results for input(s): VITAMINB12, FOLATE, FERRITIN, TIBC, IRON, RETICCTPCT in the last 72 hours. Sepsis Labs: No results for input(s): PROCALCITON, LATICACIDVEN in the last 168 hours.  Recent Results (from the past 240 hour(s))  SARS CORONAVIRUS 2 (TAT 6-24 HRS) Nasopharyngeal Nasopharyngeal Swab     Status: Abnormal   Collection Time: 10/19/19  5:27 PM   Specimen: Nasopharyngeal Swab  Result Value Ref Range Status   SARS Coronavirus 2 POSITIVE (A) NEGATIVE Final    Comment: RESULT CALLED TO, READ BACK BY AND VERIFIED WITH: H.BUYKONG,RN 0103 10/20/19 G.MCADOO (NOTE) SARS-CoV-2 target nucleic acids are DETECTED. The SARS-CoV-2 RNA is generally detectable in upper and lower respiratory specimens during the acute phase of infection. Positive results are indicative of the presence of SARS-CoV-2  RNA. Clinical correlation with patient history and other diagnostic information is  necessary to determine patient infection status. Positive results do not rule out bacterial infection or co-infection with other viruses.  The expected result is Negative. Fact Sheet for Patients: SugarRoll.be Fact Sheet for Healthcare Providers: https://www.woods-mathews.com/ This test is not yet approved or cleared by the [place] FDA and  has been authorized for detection and/or diagnosis of SARS-CoV-2 by FDA under an Emergency Use Authorization (EUA). This EUA will remain  in effect (meaning this test can be used) for the  duration of the COVID-19 declaration under Section 564(b)(1) of the Act, 21 U.S.C. section 360bbb-3(b)(1), unless the authorization is terminated or revoked sooner. Performed at [facility], [place] [address], [place], [place] [address]  Radiology Studies: No results found.    Scheduled Meds: . ARIPiprazole  2 mg Per Tube QHS  . aspirin EC  81 mg Oral Daily  . chlorhexidine  15 mL Mouth Rinse BID  . chlorhexidine gluconate (MEDLINE KIT)  15 mL Mouth Rinse BID  . Chlorhexidine Gluconate Cloth  6 each Topical Daily  . dexamethasone (DECADRON) injection  6 mg Intravenous Q24H  . enoxaparin (LOVENOX) injection  40 mg Subcutaneous Q24H  . levothyroxine  125 mcg Per Tube Q0600  . losartan  12.5 mg Oral Daily  . mouth rinse  15 mL Mouth Rinse q12n4p  . metoprolol succinate  12.5 mg Oral Daily  . pantoprazole sodium  40 mg Per Tube Daily  . pravastatin  20 mg Oral Daily  . sertraline  100 mg Per Tube Daily   Continuous Infusions: . sodium chloride 10 mL/hr at 10/12/19 0000  . remdesivir 100 mg in NS 250 mL 100 mg (10/23/19 1321)     LOS: 12 days      Time spent: 35 minutes   ,, DO [facility] 10/23/2019, 3:55 PM   Available via Epic secure chat 7am-7pm After these hours, please refer  to coverage provider listed on [email or web address]

## 2019-10-24 DIAGNOSIS — G301 Alzheimer's disease with late onset: Secondary | ICD-10-CM

## 2019-10-24 LAB — COMPREHENSIVE METABOLIC PANEL
ALT: 82 U/L — ABNORMAL HIGH (ref 0–44)
AST: 69 U/L — ABNORMAL HIGH (ref 15–41)
Albumin: 3.2 g/dL — ABNORMAL LOW (ref 3.5–5.0)
Alkaline Phosphatase: 53 U/L (ref 38–126)
Anion gap: 9 (ref 5–15)
BUN: 29 mg/dL — ABNORMAL HIGH (ref 8–23)
CO2: 20 mmol/L — ABNORMAL LOW (ref 22–32)
Calcium: 8.4 mg/dL — ABNORMAL LOW (ref 8.9–10.3)
Chloride: 113 mmol/L — ABNORMAL HIGH (ref 98–111)
Creatinine, Ser: 0.71 mg/dL (ref 0.44–1.00)
GFR calc Af Amer: >60 mL/min (ref >60)
GFR calc non Af Amer: >60 mL/min (ref >60)
Glucose, Bld: 104 mg/dL — ABNORMAL HIGH (ref 70–99)
Potassium: 4.6 mmol/L (ref 3.5–5.1)
Sodium: 142 mmol/L (ref 135–145)
Total Bilirubin: 1.3 mg/dL — ABNORMAL HIGH (ref 0.3–1.2)
Total Protein: 5.8 g/dL — ABNORMAL LOW (ref 6.5–8.1)

## 2019-10-24 LAB — CBC WITH DIFFERENTIAL/PLATELET
Abs Immature Granulocytes: 0.05 10*3/uL (ref 0.00–0.07)
Basophils Absolute: 0 10*3/uL (ref 0.0–0.1)
Basophils Relative: 0 %
Eosinophils Absolute: 0.1 10*3/uL (ref 0.0–0.5)
Eosinophils Relative: 1 %
HCT: 38.7 % (ref 36.0–46.0)
Hemoglobin: 12.5 g/dL (ref 12.0–15.0)
Immature Granulocytes: 1 %
Lymphocytes Relative: 15 %
Lymphs Abs: 1.3 10*3/uL (ref 0.7–4.0)
MCH: 31.6 pg (ref 26.0–34.0)
MCHC: 32.3 g/dL (ref 30.0–36.0)
MCV: 97.7 fL (ref 80.0–100.0)
Monocytes Absolute: 0.6 10*3/uL (ref 0.1–1.0)
Monocytes Relative: 7 %
Neutro Abs: 6.8 10*3/uL (ref 1.7–7.7)
Neutrophils Relative %: 76 %
Platelets: 211 10*3/uL (ref 150–400)
RBC: 3.96 MIL/uL (ref 3.87–5.11)
RDW: 13.2 % (ref 11.5–15.5)
WBC: 8.8 10*3/uL (ref 4.0–10.5)
nRBC: 0 % (ref 0.0–0.2)

## 2019-10-24 LAB — D-DIMER, QUANTITATIVE: D-Dimer, Quant: 3.5 ug/mL-FEU — ABNORMAL HIGH (ref 0.00–0.50)

## 2019-10-24 LAB — SAR COV2 SEROLOGY (COVID19)AB(IGG),IA: SARS-CoV-2 Ab, IgG: NONREACTIVE

## 2019-10-24 LAB — GLUCOSE, CAPILLARY
Glucose-Capillary: 108 mg/dL — ABNORMAL HIGH (ref 70–99)
Glucose-Capillary: 109 mg/dL — ABNORMAL HIGH (ref 70–99)
Glucose-Capillary: 132 mg/dL — ABNORMAL HIGH (ref 70–99)
Glucose-Capillary: 135 mg/dL — ABNORMAL HIGH (ref 70–99)
Glucose-Capillary: 89 mg/dL (ref 70–99)

## 2019-10-24 LAB — FERRITIN: Ferritin: 104 ng/mL (ref 11–307)

## 2019-10-24 LAB — OCCULT BLOOD X 1 CARD TO LAB, STOOL: Fecal Occult Bld: POSITIVE — AB

## 2019-10-24 LAB — C-REACTIVE PROTEIN: CRP: 1.4 mg/dL — ABNORMAL HIGH (ref <1.0)

## 2019-10-24 MED ORDER — SERTRALINE HCL 50 MG PO TABS
100.0000 mg | ORAL_TABLET | Freq: Every day | ORAL | Status: DC
  Administered 2019-10-25: 100 mg via ORAL
  Filled 2019-10-24: qty 2

## 2019-10-24 MED ORDER — PANTOPRAZOLE SODIUM 40 MG PO TBEC
40.0000 mg | DELAYED_RELEASE_TABLET | Freq: Every day | ORAL | Status: DC
  Administered 2019-10-24 – 2019-10-25 (×2): 40 mg via ORAL
  Filled 2019-10-24 (×2): qty 1

## 2019-10-24 MED ORDER — SODIUM CHLORIDE 0.9 % IV SOLN
100.0000 mg | INTRAVENOUS | Status: AC
  Administered 2019-10-24 – 2019-10-25 (×2): 100 mg via INTRAVENOUS
  Filled 2019-10-24 (×2): qty 100

## 2019-10-24 MED ORDER — LEVOTHYROXINE SODIUM 75 MCG PO TABS
125.0000 ug | ORAL_TABLET | Freq: Every day | ORAL | Status: DC
  Administered 2019-10-25: 125 ug via ORAL
  Filled 2019-10-24: qty 1

## 2019-10-24 NOTE — Plan of Care (Signed)
  Problem: Clinical Measurements: Goal: Will remain free from infection Outcome: Progressing   Problem: Education: Goal: Knowledge of General Education information will improve Description: Including pain rating scale, medication(s)/side effects and non-pharmacologic comfort measures Outcome: Progressing   Problem: Nutrition: Goal: Adequate nutrition will be maintained Outcome: Progressing

## 2019-10-24 NOTE — TOC Progression Note (Signed)
Transition of Care [facility]) - Progression Note    Patient Details  Name: Hannah Galvan MRN: [id number] Date of Birth: Apr 01, 1931  Transition of Care [facility]) CM/SW Contact  Joaquin Courts, RN Phone Number: 10/24/2019, 10:00 AM  Clinical Narrative:   CM spoke with admissions rep for [facility], SNF no longer able to extend bed offer to patient due to Covid+ diagnosis. CM reached out to patient's son to update him, voicemail left. Will continue to follow for dc planning.     Expected Discharge Plan: Skilled Nursing Facility Barriers to Discharge: Ship broker, Continued Medical Work up  Expected Discharge Plan and Services Expected Discharge Plan: [place] In-house Referral: Clinical Social Work Discharge Planning Services: NA Post Acute Care Choice: [place] Living arrangements for the past 2 months: [place]                 DME Arranged: N/A         HH Arranged: NA [place] Agency: NA         Social Determinants of Health (SDOH) Interventions    Readmission Risk Interventions No flowsheet data found.

## 2019-10-24 NOTE — Progress Notes (Addendum)
Nutrition Follow-up  DOCUMENTATION CODES:   Not applicable  INTERVENTION:   Continue supplements with meals: Hormel Shake daily with breakfast (is nectar thick and provides 520 kcals and 22 g of protein) and Magic cup BID with lunch and dinner (is pudding thick and provides 290 kcal and 9 grams of protein each), automatically on meal trays to optimize nutritional intake.   NUTRITION DIAGNOSIS:   Inadequate oral intake related to inability to eat as evidenced by NPO status.  Resolved   GOAL:   Patient will meet greater than or equal to 90% of their needs   Progressing PO  MONITOR:   Vent status, Labs, TF tolerance, Skin  REASON FOR ASSESSMENT:   Consult Enteral/tube feeding initiation and management  ASSESSMENT:   85 yo female admitted with acute respiratory distress requiring intubation. COVID-19 negative. PMH includes dementia, hypothyroidism, HTN, HLD, breast cancer.   Transferred to [facility] 11/29. Plans for d/c to SNF when medically stable for discharge. SLP following. Patient remains on a DYS 1 with nectar thick liquids diet. PO's: 0-30% of meals. She is being allowed to self-feed.  Admission weight: 65.3 kg  Current weight: 66.1 kg  Medications: reviewed  Labs reviewed.  CBG [id number]  Diet Order:   Diet Order            DIET - DYS 1 Room service appropriate? Yes with Assist; Fluid consistency: Nectar Thick  Diet effective now              EDUCATION NEEDS:   Not appropriate for education at this time  Skin:  Skin Assessment: Reviewed RN Assessment  Last BM:  11/30  Height:   Ht Readings from Last 1 Encounters:  10/11/19 5' 2.5" (1.588 m)    Weight:   Wt Readings from Last 1 Encounters:  10/24/19 66.1 kg   Admission weight 65.3 kg  Ideal Body Weight:  51.1 kg  BMI:  Body mass index is 26.23 kg/m.  Estimated Nutritional Needs:   Kcal:  1700-1900  Protein:  80-95 grams  Fluid:  >/= 1.6 L/day    Molli Barrows, RD,  LDN, [place] Pager [phone] After Hours Pager [phone]

## 2019-10-24 NOTE — Progress Notes (Signed)
PROGRESS NOTE  Hannah Galvan  [id number] DOB: Apr 01, 1931 DOA: 10/11/2019 PCP: Susy Frizzle, MD   Brief Narrative: Hannah Galvan is an 85 y.o. female with an extensive past medical history includingbut not limited to hypertrophic obstructive cardiomyopathy andadvanced dementia who presented to [facility] ED from ALF 11/17 in acuterespiratory distress. According to chart review patient was found on a nightly rounds with increased respiratory distress and work of breathing with an oxygen saturation of 60% on room air.Chest x-ray with signs of early opacities concerning for pulmonary edema versus multifocal infection. While in the emergency department she was febrile with Tmax 100.40F . Initial labs revealed lactic acidosis, leukocytosis, hypokalemia, AKI with Cr 1.13, and hsTroponin 960->1191 with pBNP 1074. Due to concern for COVID-19, CPAP was removed with eventual return of increased work of breathing and signs of respiratory fatigue. Though initially DNR, this was rescinded and the patient intubated.  11/17: Admitted, intubated 11/18: Patient remained intubated and found to have an EF of 25% (previously 55%).  11/19: Off vasopressors, failed SBT 11/20: Diuresed well, completed doxycycline, cefepime. 11/21: Extubated  11/22: Heparin IV stopped (medical management of NSTEMI) 11/25: Covid tested, returns positive result.  11/26: Remdesivir and decadron started.  11/29: Transferred to [place].  Assessment & Plan: Active Problems:   Hypokalemia   Alzheimer's dementia without behavioral disturbance (HCC)   HOCM (hypertrophic obstructive cardiomyopathy) (HCC)   Multifocal pneumonia   Acute respiratory failure with hypoxia (HCC)   Heart failure with reduced ejection fraction (HCC)  Acute hypoxic respiratory failure due to aspiration pneumonia, covid-19 multifocal pneumonia, and acute CHF: Initial negative test may have been false negative, though this is unclear.  - Continue supplemental  oxygen to maintain SpO2 >90% - Continue to maintain euvolemia, has completed abx, continue dysphagia 1 diet.  - Continue remdesivir x5 days and steroids. Inflammatory markers not checked, will check d-dimer, CRP, ferritin now  Septic shock: Resolved, durably off pressors.  HOCM physiology on 2018 echo, NSTEMI: EF of 25 to 30%.  Akinesis of the mid/distal anteroseptal wall and apex with the overall severe LV dysfunction. - Completed heparin, continuing aspirin. Patient not a candidate for further interventions per cardiology consultant, Dr. Gwenlyn Found.  - Continue metoprolol, ASA, statin  Acute HFrEF: EF 25% from previous of 55%.  - Now appears euvolemic s/p diuresis earlier in admission.  - On metoprolol, losartan. Not currently requiring daily diuretic Stage IIIa CKD:  - Avoid nephrotoxins  Advanced dementia, depression: With recent suspicion for worsening depression due to isolation during pandemic. - Continue zoloft, abilify - Plan to return to memory care once treatment completed.  Dark stools:  - x2 today, will send for FOBT. VSS. BUN elevated, though this is in the setting of steroids. - Hold this evening's dose of lovenox.  LFT elevation: Could be due to covid-19 directly.  - Continue monitoring while on remdesivir.  HTN:  - Continue current medications  HLD:  - Continue statin  Hypothyroidism: TSH 3.758. - Continue synthroid  Hypokalemia:  - Recheck and supplement.  Dysphagia: s/p MBS.  - Continue SLP evaluations, dysphagia 1 diet, seated upright at 90 degrees.  GERD:  - Continue PPI  DVT prophylaxis: Lovenox, SCD's Code Status: DNR Family Communication: Son by phone Disposition Plan: Uncertain, guarded prognosis. If improving, may be stable for DC to SNF [facility]) in next 24-48hrs. [facility] (pt's initial facility, currently has 19 positive cases that became positive since pt's admission).   Consultants:   Cardiology  PCCM  Procedures:   11/17:  ETT  Echocardiogram 11/17: EF 25%  Antimicrobials:  Cefepime, doxycycline, vancomycin  Remdesivir 11/26 - 12/1 (missed 11/27 dose?)  Subjective: Confused but denies any complaints. Thinks it's 2002 and she's at someone's house. Eating some. No diarrhea this AM but later RN reports 2 episodes of dark black/brown stool. No abd pain.  Objective: Vitals:   10/24/19 0500 10/24/19 0800 10/24/19 1200 10/24/19 1204  BP:      Pulse:      Resp:      Temp:  98.9 F (37.2 C)  97.7 F (36.5 C)  TempSrc:  Oral Oral Axillary  SpO2:      Weight: 66.1 kg     Height:        Intake/Output Summary (Last 24 hours) at 10/24/2019 1354 Last data filed at 10/23/2019 2111 Gross per 24 hour  Intake -  Output 400 ml  Net -400 ml   Filed Weights   10/22/19 0553 10/23/19 0539 10/24/19 0500  Weight: 71.6 kg 65.1 kg 66.1 kg    Gen: Frail, elderly female in no distress Pulm: Non-labored breathing 2L. Clear to auscultation bilaterally.  CV: Regular rate and rhythm. No murmur, rub, or gallop. No JVD, no significant pedal edema. GI: Abdomen soft, non-tender, non-distended, with normoactive bowel sounds. No organomegaly or masses felt. Ext: Warm, no deformities Skin: No rashes, lesions or ulcers on visualized skin. Neuro: Alert and disoriented. No focal neurological deficits on limited exam. Psych: Judgement and insight appear impaired. Mood & affect appropriate.   Data Reviewed: I have personally reviewed following labs and imaging studies  CBC: Recent Labs  Lab 10/18/19 0211  WBC 6.6  HGB 12.2  HCT 37.4  MCV 97.4  PLT 947   Basic Metabolic Panel: Recent Labs  Lab 10/18/19 0211 10/21/19 0444 10/22/19 0435 10/23/19 0637  NA 139 147* 144 146*  K 4.3 3.6 3.4* 3.3*  CL 109 111 109 113*  CO2 22 21* 21* 19*  GLUCOSE 142* 108* 92 84  BUN 22 26* 27* 28*  CREATININE 0.72 0.64 0.63 0.60  CALCIUM 8.3* 8.5* 8.6* 8.7*  MG 2.0  --   --   --    GFR: Estimated Creatinine Clearance: 45.6  mL/min (by C-G formula based on SCr of 0.6 mg/dL). Liver Function Tests: Recent Labs  Lab 10/21/19 0444 10/22/19 0435 10/23/19 0637  AST 125* 95* 69*  ALT 150* 130* 105*  ALKPHOS 53 55 57  BILITOT 0.7 0.7 0.8  PROT 5.9* 6.0* 5.9*  ALBUMIN 3.1* 3.1* 3.1*   No results for input(s): LIPASE, AMYLASE in the last 168 hours. No results for input(s): AMMONIA in the last 168 hours. Coagulation Profile: No results for input(s): INR, PROTIME in the last 168 hours. Cardiac Enzymes: No results for input(s): CKTOTAL, CKMB, CKMBINDEX, TROPONINI in the last 168 hours. BNP (last 3 results) No results for input(s): PROBNP in the last 8760 hours. HbA1C: No results for input(s): HGBA1C in the last 72 hours. CBG: Recent Labs  Lab 10/23/19 2049 10/23/19 2301 10/24/19 0024 10/24/19 0424 10/24/19 1203  GLUCAP 178* 151* 109* 89 108*   Lipid Profile: No results for input(s): CHOL, HDL, LDLCALC, TRIG, CHOLHDL, LDLDIRECT in the last 72 hours. Thyroid Function Tests: No results for input(s): TSH, T4TOTAL, FREET4, T3FREE, THYROIDAB in the last 72 hours. Anemia Panel: No results for input(s): VITAMINB12, FOLATE, FERRITIN, TIBC, IRON, RETICCTPCT in the last 72 hours. Urine analysis:    Component Value Date/Time   COLORURINE YELLOW 01/08/2019  1400   APPEARANCEUR CLOUDY (A) 01/08/2019 1400   LABSPEC 1.024 01/08/2019 1400   PHURINE 7.0 01/08/2019 1400   GLUCOSEU 50 (A) 01/08/2019 1400   HGBUR NEGATIVE 01/08/2019 1400   BILIRUBINUR NEGATIVE 01/08/2019 1400   BILIRUBINUR neg 08/31/2012 1142   KETONESUR 20 (A) 01/08/2019 1400   PROTEINUR 100 (A) 01/08/2019 1400   UROBILINOGEN 0.2 10/02/2015 2102   NITRITE NEGATIVE 01/08/2019 1400   LEUKOCYTESUR NEGATIVE 01/08/2019 1400   Recent Results (from the past 240 hour(s))  SARS CORONAVIRUS 2 (TAT 6-24 HRS) Nasopharyngeal Nasopharyngeal Swab     Status: Abnormal   Collection Time: 10/19/19  5:27 PM   Specimen: Nasopharyngeal Swab  Result Value Ref Range  Status   SARS Coronavirus 2 POSITIVE (A) NEGATIVE Final    Comment: RESULT CALLED TO, READ BACK BY AND VERIFIED WITH: H.BUYKONG,RN 0103 10/20/19 G.MCADOO (NOTE) SARS-CoV-2 target nucleic acids are DETECTED. The SARS-CoV-2 RNA is generally detectable in upper and lower respiratory specimens during the acute phase of infection. Positive results are indicative of the presence of SARS-CoV-2 RNA. Clinical correlation with patient history and other diagnostic information is  necessary to determine patient infection status. Positive results do not rule out bacterial infection or co-infection with other viruses.  The expected result is Negative. Fact Sheet for Patients: SugarRoll.be Fact Sheet for Healthcare Providers: https://www.woods-mathews.com/ This test is not yet approved or cleared by the [place] FDA and  has been authorized for detection and/or diagnosis of SARS-CoV-2 by FDA under an Emergency Use Authorization (EUA). This EUA will remain  in effect (meaning this test can be used) for the  duration of the COVID-19 declaration under Section 564(b)(1) of the Act, 21 U.S.C. section 360bbb-3(b)(1), unless the authorization is terminated or revoked sooner. Performed at [facility], [place] [address], [place], [place] [address]       Radiology Studies: No results found.  Scheduled Meds: . ARIPiprazole  2 mg Per Tube QHS  . aspirin EC  81 mg Oral Daily  . chlorhexidine  15 mL Mouth Rinse BID  . chlorhexidine gluconate (MEDLINE KIT)  15 mL Mouth Rinse BID  . Chlorhexidine Gluconate Cloth  6 each Topical Daily  . dexamethasone (DECADRON) injection  6 mg Intravenous Q24H  . [START ON 10/25/2019] levothyroxine  125 mcg Oral Q0600  . losartan  12.5 mg Oral Daily  . mouth rinse  15 mL Mouth Rinse q12n4p  . metoprolol succinate  12.5 mg Oral Daily  . pantoprazole  40 mg Oral Daily  . pravastatin  20 mg Oral Daily  . [START ON  10/25/2019] sertraline  100 mg Oral Daily   Continuous Infusions: . sodium chloride 10 mL/hr at 10/12/19 0000  . remdesivir 100 mg in NS 250 mL 100 mg (10/24/19 0938)     LOS: 13 days   Time spent: 35 minutes.  Patrecia Pour, MD [facility] [email or web address] 10/24/2019, 1:54 PM

## 2019-10-24 NOTE — Progress Notes (Signed)
  Speech Language Pathology Treatment: Dysphagia  Patient Details Name: DANIE HODSON MRN: [id number] DOB: 1931-01-14 Today's Date: 10/24/2019 Time: [id number] SLP Time Calculation (min) (ACUTE ONLY): 12 min  Assessment / Plan / Recommendation Clinical Impression  Pt did not want solids as it looks like she had just finished her breakfast tray on her bedside table. It looks like she had been getting nectar thick liquids via spoon, but when offered this way by SLP, pt had reduced labial seal, needing cues to wrap her lips around the spoon, and had immediate coughing. She did also c/o these juices burning her tongue (no obvious coating on her tongue that I could see). When offered nectar thick milk via straw pt had seemingly improved intake with no overt s/s of aspiration and more automatic appearing acceptance and oral transit. I think it will be good to allow her to initiate intake as much as mentation will allow - using a straw and/or helping her self-feed can help with this. Would maintain current diet for now.   HPI HPI:  85 year old female who presented from outside skilled nursing facility with acute respiratory distress, COVID-19 negative on admission.  Patient suffered worsening respiratory distress in emergency department and was subsequently intubated. Pt was intubated from 11/17-11/20. Pt with PMH of dementia, HTN, SOB.  BSE completed on 01/10/19 with recommendations for Dysphagia 3 (soft) solids and thin liquid.        SLP Plan  Continue with current plan of care       Recommendations  Diet recommendations: Dysphagia 1 (puree);Nectar-thick liquid Liquids provided via: Cup;Straw Medication Administration: Crushed with puree Supervision: Staff to assist with self feeding;Full supervision/cueing for compensatory strategies Compensations: Slow rate;Small sips/bites Postural Changes and/or Swallow Maneuvers: Seated upright 90 degrees                Oral Care Recommendations: Oral  care BID Follow up Recommendations: Skilled Nursing facility;24 hour supervision/assistance SLP Visit Diagnosis: Dysphagia, oropharyngeal phase (R13.12) Plan: Continue with current plan of care       GO                Venita Sheffield Anagabriela Jokerst 10/24/2019, 9:22 AM  Pollyann Glen, M.A. [place] Acute Environmental education officer [phone] Office [phone]

## 2019-10-24 NOTE — TOC Progression Note (Signed)
Transition of Care [facility]) - Progression Note    Patient Details  Name: Hannah Galvan MRN: [id number] Date of Birth: Mar 16, 1931  Transition of Care [facility]) CM/SW Contact  Joaquin Courts, RN Phone Number: 10/24/2019, 11:03 AM  Clinical Narrative:    CM received call back from patient's son. CM discussed the bed offers extended to patient and [facility] was notified of choice.  CM spoke with [facility] health and received auth for patient to go to [facility] [place] number [phone].     Expected Discharge Plan: Skilled Nursing Facility Barriers to Discharge: Ship broker, Continued Medical Work up  Expected Discharge Plan and Services Expected Discharge Plan: [place] In-house Referral: Clinical Social Work Discharge Planning Services: NA Post Acute Care Choice: [place] Living arrangements for the past 2 months: [place]                 DME Arranged: N/A         HH Arranged: NA [place] Agency: NA         Social Determinants of Health (SDOH) Interventions    Readmission Risk Interventions No flowsheet data found.

## 2019-10-24 NOTE — Care Management Important Message (Signed)
Important Message  Patient Details  Name: Hannah Galvan MRN: [id number] Date of Birth: 1931/03/20   Medicare Important Message Given:  Yes - Important Message mailed due to current National Emergency  Verbal consent obtained due to current National Emergency  Relationship to patient: Child Contact Name: Manuella Sahagian Call Date: 10/24/19  Time: 1354 Phone: [phone] Outcome: Spoke with contact Important Message mailed to: Other (must enter comment)(emailed to [email or web address])    Emrick Hensch P Tyreanna Bisesi 10/24/2019, 1:55 PM

## 2019-10-25 ENCOUNTER — Telehealth: Payer: Self-pay | Admitting: Family Medicine

## 2019-10-25 LAB — CBC WITH DIFFERENTIAL/PLATELET
Abs Immature Granulocytes: 0.04 10*3/uL (ref 0.00–0.07)
Basophils Absolute: 0 10*3/uL (ref 0.0–0.1)
Basophils Relative: 0 %
Eosinophils Absolute: 0 10*3/uL (ref 0.0–0.5)
Eosinophils Relative: 0 %
HCT: 39.1 % (ref 36.0–46.0)
Hemoglobin: 12.7 g/dL (ref 12.0–15.0)
Immature Granulocytes: 1 %
Lymphocytes Relative: 15 %
Lymphs Abs: 1.3 10*3/uL (ref 0.7–4.0)
MCH: 31.2 pg (ref 26.0–34.0)
MCHC: 32.5 g/dL (ref 30.0–36.0)
MCV: 96.1 fL (ref 80.0–100.0)
Monocytes Absolute: 0.4 10*3/uL (ref 0.1–1.0)
Monocytes Relative: 5 %
Neutro Abs: 7.1 10*3/uL (ref 1.7–7.7)
Neutrophils Relative %: 79 %
Platelets: 272 10*3/uL (ref 150–400)
RBC: 4.07 MIL/uL (ref 3.87–5.11)
RDW: 13.3 % (ref 11.5–15.5)
WBC: 8.8 10*3/uL (ref 4.0–10.5)
nRBC: 0 % (ref 0.0–0.2)

## 2019-10-25 LAB — COMPREHENSIVE METABOLIC PANEL
ALT: 78 U/L — ABNORMAL HIGH (ref 0–44)
AST: 45 U/L — ABNORMAL HIGH (ref 15–41)
Albumin: 3.4 g/dL — ABNORMAL LOW (ref 3.5–5.0)
Alkaline Phosphatase: 59 U/L (ref 38–126)
Anion gap: 11 (ref 5–15)
BUN: 23 mg/dL (ref 8–23)
CO2: 21 mmol/L — ABNORMAL LOW (ref 22–32)
Calcium: 8.7 mg/dL — ABNORMAL LOW (ref 8.9–10.3)
Chloride: 110 mmol/L (ref 98–111)
Creatinine, Ser: 0.53 mg/dL (ref 0.44–1.00)
GFR calc Af Amer: >60 mL/min (ref >60)
GFR calc non Af Amer: >60 mL/min (ref >60)
Glucose, Bld: 89 mg/dL (ref 70–99)
Potassium: 3.8 mmol/L (ref 3.5–5.1)
Sodium: 142 mmol/L (ref 135–145)
Total Bilirubin: 0.7 mg/dL (ref 0.3–1.2)
Total Protein: 6.2 g/dL — ABNORMAL LOW (ref 6.5–8.1)

## 2019-10-25 LAB — C-REACTIVE PROTEIN: CRP: 1.3 mg/dL — ABNORMAL HIGH (ref <1.0)

## 2019-10-25 LAB — GLUCOSE, CAPILLARY
Glucose-Capillary: 126 mg/dL — ABNORMAL HIGH (ref 70–99)
Glucose-Capillary: 73 mg/dL (ref 70–99)
Glucose-Capillary: 86 mg/dL (ref 70–99)
Glucose-Capillary: 86 mg/dL (ref 70–99)

## 2019-10-25 LAB — D-DIMER, QUANTITATIVE: D-Dimer, Quant: 3.66 ug/mL-FEU — ABNORMAL HIGH (ref 0.00–0.50)

## 2019-10-25 MED ORDER — PANTOPRAZOLE SODIUM 40 MG PO TBEC: 40.0000 mg | DELAYED_RELEASE_TABLET | Freq: Every day | ORAL | Status: AC

## 2019-10-25 MED ORDER — LORAZEPAM 1 MG PO TABS: 1.0000 mg | ORAL_TABLET | Freq: Every day | ORAL | 0 refills | Status: AC

## 2019-10-25 NOTE — TOC Progression Note (Addendum)
Transition of Care [facility]) - Progression Note    Patient Details  Name: Hannah Galvan MRN: [id number] Date of Birth: 08-21-1931  Transition of Care [facility]) CM/SW Contact  Joaquin Courts, RN Phone Number: 10/25/2019, 11:18 AM  Clinical Narrative:    CM confirmed bed availability for dc today to [facility]. Patient will dc to room 806 via [facility]. DC plan communicated to son. Bedside RN please call report to [phone]. Please place face sheet, medical necessity form, yellow DNR form, and any written prescriptions into dc packet for transport.     Expected Discharge Plan: [place] Barriers to Discharge: No Barriers Identified  Expected Discharge Plan and Services Expected Discharge Plan: [place] In-house Referral: Clinical Social Work Discharge Planning Services: NA Post Acute Care Choice: [place] Living arrangements for the past 2 months: [place] Expected Discharge Date: 10/25/19               DME Arranged: N/A         HH Arranged: NA HH Agency: NA         Social Determinants of Health (SDOH) Interventions    Readmission Risk Interventions No flowsheet data found.

## 2019-10-25 NOTE — Plan of Care (Signed)
Report given to Carilyn Goodpasture at [facility]

## 2019-10-25 NOTE — Plan of Care (Addendum)
Pt slept well during the night. No complaints of pain verbalized. Alert and oriented to person only. B Mitts discontinued as patient got very distressed over it last night, has not attempted to pull out her lines since. Vitals stable weaned to RA. Moderate assist with ADLs. Incontinent, purewick draining well. Sacral foam intact. Assisted with snacks and drinks.  IV SL. Assisted regular repositioning for pressure relief. No other issues, will monitor.   Problem: Activity: Goal: Risk for activity intolerance will decrease Outcome: Progressing   Problem: Nutrition: Goal: Adequate nutrition will be maintained Outcome: Progressing   Problem: Coping: Goal: Level of anxiety will decrease Outcome: Progressing   Problem: Skin Integrity: Goal: Risk for impaired skin integrity will decrease Outcome: Progressing

## 2019-10-25 NOTE — Telephone Encounter (Signed)
Noted.  I am sorry to hear that.   Please let me know if I can do anything to help.

## 2019-10-25 NOTE — Telephone Encounter (Signed)
FYI:  Son Lendell Caprice called in states that his mom went into the hospital 2 weeks ago for pneumonia and while she was there also got Covid. She is going to rehab at the [facility] on [address]. They are the only facility that would take her since she has Covid.  CB# [phone]

## 2019-10-25 NOTE — Discharge Summary (Signed)
Physician Discharge Summary  CHRISSA MEETZE LZJ:[id number] DOB: February 06, 1931 DOA: 10/11/2019  PCP: Susy Frizzle, MD  Admit date: 10/11/2019 Discharge date: 10/25/2019  Admitted From: ALF Disposition: SNF   Recommendations for Outpatient Follow-up:  1. Follow up with PCP in 1-2 weeks 2. Needs cardiology follow up for newly diagnosed HFrEF 3. Consider follow up with GI if FOBT remains positive.  4. Please obtain CMP/CBC in one week  Home Health: N/A Equipment/Devices: Per SNF Discharge Condition: Stable CODE STATUS: DNR Diet recommendation: Dysphagia 1  Brief/Interim Summary: IDALIA ALLBRITTON an 85 y.o. female with an extensive past medical history includingbut not limited to hypertrophic obstructive cardiomyopathy andadvanced dementia who presented to [facility] ED from ALF 11/17 in acuterespiratory distress. According to chart review patient was found on a nightly rounds with increased respiratory distress and work of breathing with an oxygen saturation of 60% on room air.Chest x-ray with signs of early opacities concerning for pulmonary edema versus multifocal infection. While in the emergency department she was febrile with Tmax 100.48F . Initial labs revealed lactic acidosis, leukocytosis, hypokalemia, AKI with Cr 1.13, and hsTroponin 960->1191 with pBNP 1074. Due to concern for COVID-19, CPAP was removed with eventual return of increased work of breathing and signs of respiratory fatigue. Though initially DNR, this was rescinded and the patient intubated.  11/17: Admitted, intubated 11/18: Patient remained intubated and found to have an EF of 25% (previously 55%).  11/19: Off vasopressors, failed SBT 11/20: Diuresed well, completed doxycycline, cefepime. 11/21: Extubated  11/22: Heparin IV stopped (medical management of NSTEMI) 11/25: Covid tested, returns positive result.  11/26: Remdesivir and decadron started.  11/29: Transferred to [facility]. 11/30: Liberated from oxygen 12/01:  Completed remdesivir, discharged to SNF  Discharge Diagnoses:  Active Problems:   Hypokalemia   Alzheimer's dementia without behavioral disturbance (HCC)   HOCM (hypertrophic obstructive cardiomyopathy) (HCC)   Multifocal pneumonia   Acute respiratory failure with hypoxia (HCC)   Heart failure with reduced ejection fraction (HCC)  Acute hypoxic respiratory failure due to aspiration pneumonia, covid-19 multifocal pneumonia, and acute CHF: Initial negative test may have been false negative, though this is unclear. Has been weaned to room air - Continue supplemental oxygen to maintain SpO2 >90% - Continue to maintain euvolemia, has completed abx, continue dysphagia 1 diet.  - Completed remdesivir x5 days on 12/1, continue steroids x5 more days post discharge. Inflammatory markers minimally elevated.   Septic shock: Resolved, durably off pressors.  HOCM physiology on 2018 echo, NSTEMI: EF of 25 to 30%. Akinesis of the mid/distal anteroseptal wall and apex with the overall severe LV dysfunction. - Completed heparin, continuing aspirin. Patient not a candidate for further interventions per cardiology consultant, Dr. Gwenlyn Found.  - Continue metoprolol, ASA, statin  Acute HFrEF: EF 25% from previous of 55%.  - Now appears euvolemic s/p diuresis earlier in admission. Not currently requiring daily diuretic. - On metoprolol, losartan, ASA 3m PTA. Can continue these.   Stage IIIa CKD:  - Avoid nephrotoxins, tolerating ARB  Advanced dementia, depression: With recent suspicion for worsening depression due to isolation during pandemic. - Continue zoloft, abilify, aricept - Plan to return to SNF/memory care once treatment completed.  Occult GI bleeding: +FOBT on 11/30. Hgb wnl. VSS. BUN elevated, though this is in the setting of steroids. - Avoid anticoagulation - Consider GI follow up.  - Continue PPI  LFT elevation: Could be due to covid-19 directly.  - Continues to improve. Please  recheck at follow up.  HTN:  -  Continue home medications  HLD:  - Continue statin  Hypothyroidism: TSH 3.758. - Continue synthroid  Hypokalemia:  - Continue 35mq daily supplement (PTA med) Dysphagia: s/p MBS.  - Continue SLP evaluations, dysphagia 1 diet, seated upright at 90 degrees.  Discharge Instructions  Allergies as of 10/25/2019      Reactions   Ambien [zolpidem]    confusion   Pyridium [phenazopyridine Hcl] Other (See Comments)   hemolysis   Sulfa Antibiotics Rash      Medication List    TAKE these medications   acetaminophen 325 MG tablet Commonly known as: TYLENOL TAKE 1 TAB BY MOUTH EVERY 6 HOURS AS NEEDED FOR MILD PAIN What changed: See the new instructions.   albuterol 108 (90 Base) MCG/ACT inhaler Commonly known as: VENTOLIN HFA Inhale 2 puffs into the lungs every 4 (four) hours as needed for wheezing or shortness of breath.   amLODipine 5 MG tablet Commonly known as: NORVASC Take 1 tablet (5 mg total) by mouth daily.   ARIPiprazole 2 MG tablet Commonly known as: ABILIFY Take 1 tablet (2 mg total) by mouth daily.   aspirin 81 MG tablet Take 81 mg by mouth daily.   CENTRUM SILVER ADULT 50+ PO Take 1 tablet by mouth daily.   cholecalciferol 1000 units tablet Commonly known as: VITAMIN D Take 1,000 Units by mouth daily.   Cranberry 500 MG Tabs Take 500 mg by mouth daily.   donepezil 5 MG tablet Commonly known as: ARICEPT Take 1 tablet (5 mg total) by mouth at bedtime.   exemestane 25 MG tablet Commonly known as: AROMASIN TAKE 1 TAB BY MOUTH EVERY DAY What changed: See the new instructions.   folic acid 1 MG tablet Commonly known as: FOLVITE TAKE 1 TAB BY MOUTH EVERY DAY What changed:   how much to take  how to take this  when to take this  additional instructions   GNP Calcium 1200 1200-1000 MG-UNIT Chew 1 tab PO QAM What changed:   how much to take  how to take this  when to take this  additional  instructions   hydrochlorothiazide 25 MG tablet Commonly known as: HYDRODIURIL Take 1 tablet (25 mg total) by mouth daily.   levothyroxine 125 MCG tablet Commonly known as: SYNTHROID TAKE 1 TAB BY MOUTH EVERY DAY What changed:   how much to take  how to take this  when to take this  additional instructions   LORazepam 0.5 MG tablet Commonly known as: ATIVAN Take 0.5 mg by mouth every 8 (eight) hours as needed for anxiety.   LORazepam 1 MG tablet Commonly known as: ATIVAN Take 1 tablet (1 mg total) by mouth at bedtime.   losartan 100 MG tablet Commonly known as: COZAAR TAKE 1 TAB BY MOUTH EVERY DAY FOR HTN What changed:   how much to take  how to take this  when to take this  additional instructions   metoprolol succinate 25 MG 24 hr tablet Commonly known as: TOPROL-XL Take 1 tablet (25 mg total) by mouth daily.   nitroGLYCERIN 0.4 MG SL tablet Commonly known as: NITROSTAT Place 1 tablet (0.4 mg total) under the tongue every 5 (five) minutes as needed for chest pain.   pantoprazole 40 MG tablet Commonly known as: PROTONIX Take 1 tablet (40 mg total) by mouth daily. Start taking on: D12/18/20  potassium chloride SA 20 MEQ tablet Commonly known as: Klor-Con M20 Take 1 tablet (20 mEq total) by mouth daily.  rosuvastatin 10 MG tablet Commonly known as: CRESTOR TAKE 1 TABLET BY MOUTH EVERY DAY AT 6PM What changed:   how much to take  how to take this  when to take this  additional instructions   sertraline 100 MG tablet Commonly known as: ZOLOFT Take 1 tablet (100 mg total) by mouth daily.   triamcinolone cream 0.1 % Commonly known as: KENALOG Apply 1 application topically 2 (two) times daily. What changed:   when to take this  reasons to take this       Contact information for follow-up providers    Susy Frizzle, MD. Schedule an appointment as soon as possible for a visit in 1 week(s).   Specialty: Family  Medicine Contact information: [address] [place] [address] [phone]        Lorretta Harp, MD .   Specialties: Cardiology, Radiology Contact information: [address] [place] [address] [phone]            Contact information for after-discharge care    Destination    [facility] .   Service: Skilled Nursing Contact information: [address] [place] [phone]                 Allergies  Allergen Reactions  . Ambien [Zolpidem]     confusion  . Pyridium [Phenazopyridine Hcl] Other (See Comments)    hemolysis  . Sulfa Antibiotics Rash    Consultations:  Cardiology  PCCM  Procedures/Studies: Dg Chest Port 1 View  Result Date: 10/12/2019 CLINICAL DATA:  Presence of endotracheal tube. EXAM: PORTABLE CHEST 1 VIEW COMPARISON:  10/11/2019 FINDINGS: Endotracheal tube is 2.3 cm above the carina. Nasogastric tube extends into the abdomen but the tip is beyond the image. Patchy hazy densities in the right lung have slightly decreased. Persistent obscuration of the left hemidiaphragm suggestive for consolidation or volume loss. Negative for pneumothorax. Heart size is within normal limits and stable. IMPRESSION: 1. Lung densities have minimally changed. Persistent airspace densities throughout the right lung. Persistent volume loss or consolidation at the left lung base. 2. Endotracheal tube is appropriately positioned. Electronically Signed   By: Markus Daft M.D.   On: 10/12/2019 08:36   Dg Chest Port 1 View  Addendum Date: 10/11/2019   ADDENDUM REPORT: 10/11/2019 16:37 ADDENDUM: These results will be called to the ordering clinician or representative by the Radiologist Assistant, and communication documented in the PACS or zVision Dashboard. Electronically Signed   By: Lovena Le M.D.   On: 10/11/2019 16:37   Result Date: 10/11/2019 CLINICAL DATA:  ET tube  insertion EXAM: PORTABLE CHEST 1 VIEW COMPARISON:  Radiograph 10/11/2019 FINDINGS: Suboptimal imaging quality with grid artifact. Low positioning of the endotracheal tube with the tip positioned approximately 5 mm from the carina. Transesophageal tube tip and side port distal to the GE junction. Redemonstration of the right upper and mid lung airspace consolidation as well as more dense retrocardiac opacity as well. Findings are similar to most recent comparison study. The aorta is calcified. The remaining cardiomediastinal contours are unremarkable. Degenerative changes are present in the imaged spine and shoulders. IMPRESSION: 1. Low positioning of the endotracheal tube with the tip positioned approximately 5 mm from the carina. Recommend retraction 3-4 cm to the mid trachea. 2. Satisfactory positioning of the transesophageal tube. 3. Bilateral airspace disease, essentially unchanged from most recent study. 4. Suboptimal imaging quality due to technical factors. Electronically Signed: By:  Lovena Le M.D. On: 10/11/2019 16:35   Dg Chest Port 1 View  Result Date: 10/11/2019 CLINICAL DATA:  Intubation, OG tube placement EXAM: PORTABLE CHEST 1 VIEW COMPARISON:  10/11/2019 FINDINGS: Endotracheal to is 2 cm above the carina. NG tube enters the stomach. Airspace consolidation noted in the right upper lobe and left perihilar and lower lobe. Slightly improved since prior study. No visible effusions. No acute bony abnormality. IMPRESSION: Support devices as above. Bilateral airspace disease, slightly improved since prior study. Electronically Signed   By: Rolm Baptise M.D.   On: 10/11/2019 01:22   Dg Chest Portable 1 View  Result Date: 10/11/2019 CLINICAL DATA:  Shortness of breath. Respiratory distress. EXAM: PORTABLE CHEST 1 VIEW COMPARISON:  06/21/2019 FINDINGS: Unchanged heart size and mediastinal contours with aortic atherosclerosis. Diffuse heterogeneous mixed interstitial patchy airspace opacities  throughout both lungs, right greater than left. Pulmonary vasculature is indistinct. Bilateral pleural effusions. Retrocardiac hiatal hernia. No pneumothorax. IMPRESSION: 1. Diffuse heterogeneous mixed interstitial and patchy airspace opacities, right greater than left. Findings may represent pulmonary edema, multifocal infection, or combination thereof. 2. Small bilateral pleural effusions. 3. Aortic atherosclerosis. Electronically Signed   By: Keith Rake M.D.   On: 10/11/2019 00:59   Dg Swallowing Func-speech Pathology  Result Date: 10/16/2019 Objective Swallowing Evaluation: Type of Study: MBS-Modified Barium Swallow Study  Patient Details Name: PAULETT KAUFHOLD MRN: [id number] Date of Birth: 1931/01/28 Today's Date: 10/16/2019 Time: SLP Start Time (ACUTE ONLY): 8938 -SLP Stop Time (ACUTE ONLY): 1300 SLP Time Calculation (min) (ACUTE ONLY): 25 min Past Medical History: Past Medical History: Diagnosis Date . Allergy  . Anxiety  . Arthritis  . Blood transfusion without reported diagnosis  . Breast cancer ([place])   breast, s/p RXT, bilateral . Cataract  . CKD (chronic kidney disease), stage III  . Delirium  . Dementia ([place])  . History of echocardiogram   a. Echo 8/16: Vigorous LVF, EF 10-17%, grade 1 diastolic dysfunction, trivial AI, mild MR, mild RVE, normal RV function, PASP 33 mmHg // b. Echo 1/17 EF 65-70%, normal wall motion, grade 1 diastolic dysfunction, trivial AI, MAC, mild MR // c. Echo 11/26/16: mild LVH, EF 60-65, mild AI, mild MR, mild LAE, mild to mod TR . History of stress test   Lexiscan Myoview 8/16:  EF 81%, breast attenuation, no ischemia; Low Risk // Myoview 11/26/16: EF 75, apical defect c/w soft tissue attenuation, no ischemia or scar; Low Risk . HOCM (hypertrophic obstructive cardiomyopathy) ([place])  . Hyperlipidemia  . Hypertension  . Hypothyroidism  . Personal history of radiation therapy  . Premature atrial contractions  Past Surgical History: Past Surgical History: Procedure Laterality Date  . ABDOMINAL HYSTERECTOMY  2008 . bladder tact   . BREAST LUMPECTOMY Right 2000 . BREAST LUMPECTOMY Left 2007 . CATARACT EXTRACTION  2011 . JOINT REPLACEMENT   . KNEE SURGERY    right . ROTATOR CUFF REPAIR    right . THYROID SURGERY  2002 . TOTAL KNEE ARTHROPLASTY Left 06/02/2013  Dr Rhona Raider . TOTAL KNEE ARTHROPLASTY Right 06/02/2013  Procedure: RIGHT TOTAL KNEE ARTHROPLASTY;  Surgeon: Hessie Dibble, MD;  Location: [place];  Service: Orthopedics;  Laterality: Right; . TOTAL KNEE ARTHROPLASTY Left 08/22/2014  Procedure: TOTAL KNEE ARTHROPLASTY;  Surgeon: Hessie Dibble, MD;  Location: [place];  Service: Orthopedics;  Laterality: Left; HPI:  85 year old female who presented from outside skilled nursing facility with acute respiratory distress, COVID-19 negative on admission.  Patient suffered worsening respiratory distress in emergency department  and was subsequently intubated. Pt was intubated from 11/17-11/20. Pt with PMH of dementia, HTN, SOB.  BSE completed on 01/10/19 with recommendations for Dysphagia 3 (soft) solids and thin liquid.   Subjective: Pt was awake/alert and pleasant. Assessment / Plan / Recommendation CHL IP CLINICAL IMPRESSIONS 10/16/2019 Clinical Impression Pt presents with moderate oropharyngeal dysphagia with resultant aspiration of thin liquid on today's examination.   Aspiration was sensed intermittently; however, she was unable to completely clear aspirated material with a reflexive or cued cough. Pt additionally exhibited laryngeal penetration of nectar-thick liquid via cup sip x1.  No laryngeal penetration or aspiration was observed with thin liquid via tsp, nectar-thick liquid via straw, honey-thick liquid, puree, or regular solids.  Oral phase was remarkable for reduced lingual control resulting in premature spillage to the pharynx, reduced lingual strength resulting in trace oral residue, and reduced lingual coordination, resulting in repetitive lingual movements.  Pharyngeal phase was  remarkable for reduced BOT retraction and reduced hyolaryngeal excursion resulting in vallecular residue.  Pt additionally exhibited reduced laryngeal closure resulting in laryngeal penetration and aspiration.  Swallow initiation was at the level of the pyriform sinuses with thin and nectar-thick liquid and at the level of the valleculae with honey-thick liquid, puree, and regular solids.  Pharyngoesophageal phase was unremarkable.  Recommend Dysphagia 1 (puree) solids and nectar-thick liquid with the following precautions: 1) Small bites/sips 2) Slow rate of intake 3) Sit upright 90 degrees. SLP Visit Diagnosis Dysphagia, oropharyngeal phase (R13.12) Attention and concentration deficit following -- Frontal lobe and executive function deficit following -- Impact on safety and function Mild aspiration risk;Moderate aspiration risk   CHL IP TREATMENT RECOMMENDATION 10/16/2019 Treatment Recommendations Therapy as outlined in treatment plan below   Prognosis 10/16/2019 Prognosis for Safe Diet Advancement Fair Barriers to Reach Goals -- Barriers/Prognosis Comment -- CHL IP DIET RECOMMENDATION 10/16/2019 SLP Diet Recommendations Dysphagia 1 (Puree) solids;Nectar thick liquid Liquid Administration via Cup;Straw Medication Administration Whole meds with puree Compensations Slow rate;Small sips/bites Postural Changes Seated upright at 90 degrees   CHL IP OTHER RECOMMENDATIONS 10/16/2019 Recommended Consults -- Oral Care Recommendations Oral care BID Other Recommendations Order thickener from pharmacy   CHL IP FOLLOW UP RECOMMENDATIONS 10/16/2019 Follow up Recommendations Skilled Nursing facility;24 hour supervision/assistance   CHL IP FREQUENCY AND DURATION 10/16/2019 Speech Therapy Frequency (ACUTE ONLY) min 2x/week Treatment Duration 2 weeks      CHL IP ORAL PHASE 10/16/2019 Oral Phase Impaired Oral - Pudding Teaspoon -- Oral - Pudding Cup -- Oral - Honey Teaspoon -- Oral - Honey Cup Weak lingual manipulation;Decreased  bolus cohesion;Premature spillage;Lingual/palatal residue Oral - Nectar Teaspoon -- Oral - Nectar Cup Premature spillage;Decreased bolus cohesion;Delayed oral transit;Weak lingual manipulation Oral - Nectar Straw Premature spillage;Decreased bolus cohesion;Delayed oral transit;Lingual/palatal residue Oral - Thin Teaspoon Premature spillage;Decreased bolus cohesion;Lingual/palatal residue Oral - Thin Cup Premature spillage;Decreased bolus cohesion;Piecemeal swallowing;Lingual pumping Oral - Thin Straw Lingual pumping;Decreased bolus cohesion;Premature spillage;Lingual/palatal residue Oral - Puree Decreased bolus cohesion;Premature spillage;Lingual/palatal residue;Piecemeal swallowing;Weak lingual manipulation;Lingual pumping Oral - Mech Soft -- Oral - Regular Delayed oral transit;Weak lingual manipulation;Lingual pumping;Impaired mastication;Piecemeal swallowing;Lingual/palatal residue;Decreased bolus cohesion Oral - Multi-Consistency -- Oral - Pill -- Oral Phase - Comment --  CHL IP PHARYNGEAL PHASE 10/16/2019 Pharyngeal Phase Impaired Pharyngeal- Pudding Teaspoon -- Pharyngeal -- Pharyngeal- Pudding Cup -- Pharyngeal -- Pharyngeal- Honey Teaspoon -- Pharyngeal -- Pharyngeal- Honey Cup Delayed swallow initiation-vallecula;Reduced anterior laryngeal mobility;Reduced tongue base retraction;Pharyngeal residue - valleculae Pharyngeal Material does not enter airway Pharyngeal- Nectar Teaspoon -- Pharyngeal -- Pharyngeal-  Nectar Cup Pharyngeal residue - valleculae;Penetration/Aspiration before swallow;Reduced tongue base retraction;Reduced airway/laryngeal closure;Reduced anterior laryngeal mobility;Delayed swallow initiation-pyriform sinuses Pharyngeal Material enters airway, remains ABOVE vocal cords then ejected out Pharyngeal- Nectar Straw Delayed swallow initiation-pyriform sinuses;Reduced anterior laryngeal mobility;Reduced tongue base retraction;Pharyngeal residue - valleculae Pharyngeal Material does not enter  airway Pharyngeal- Thin Teaspoon Delayed swallow initiation-pyriform sinuses;Reduced anterior laryngeal mobility;Reduced tongue base retraction;Pharyngeal residue - valleculae Pharyngeal Material does not enter airway Pharyngeal- Thin Cup Delayed swallow initiation-pyriform sinuses;Reduced anterior laryngeal mobility;Reduced airway/laryngeal closure;Reduced tongue base retraction;Penetration/Aspiration before swallow;Penetration/Aspiration during swallow;Trace aspiration;Pharyngeal residue - valleculae Pharyngeal Material enters airway, passes BELOW cords and not ejected out despite cough attempt by patient Pharyngeal- Thin Straw Delayed swallow initiation-pyriform sinuses;Reduced anterior laryngeal mobility;Reduced airway/laryngeal closure;Reduced tongue base retraction;Penetration/Aspiration before swallow;Penetration/Aspiration during swallow;Trace aspiration;Pharyngeal residue - valleculae Pharyngeal Material enters airway, passes BELOW cords without attempt by patient to eject out (silent aspiration) Pharyngeal- Puree Delayed swallow initiation-vallecula;Reduced tongue base retraction;Reduced anterior laryngeal mobility;Pharyngeal residue - valleculae Pharyngeal Material does not enter airway Pharyngeal- Mechanical Soft -- Pharyngeal -- Pharyngeal- Regular Delayed swallow initiation-vallecula;Reduced tongue base retraction;Reduced anterior laryngeal mobility;Pharyngeal residue - valleculae Pharyngeal -- Pharyngeal- Multi-consistency -- Pharyngeal -- Pharyngeal- Pill -- Pharyngeal -- Pharyngeal Comment --  CHL IP CERVICAL ESOPHAGEAL PHASE 10/16/2019 Cervical Esophageal Phase WFL Pudding Teaspoon -- Pudding Cup -- Honey Teaspoon -- Honey Cup -- Nectar Teaspoon -- Nectar Cup -- Nectar Straw -- Thin Teaspoon -- Thin Cup -- Thin Straw -- Puree -- Mechanical Soft -- Regular -- Multi-consistency -- Pill -- Cervical Esophageal Comment -- Colin Mulders M.S., CCC-SLP Acute Rehabilitation Services Office: [phone] [place] 10/16/2019, 1:58 PM               Echocardiogram 10/11/2019:   1. Left ventricular ejection fraction, by visual estimation, is 25 to 30%. The left ventricle has severely decreased function. There is no left ventricular hypertrophy.  2. Elevated left atrial pressure.  3. Left ventricular diastolic parameters are consistent with Grade I diastolic dysfunction (impaired relaxation).  4. Global right ventricle has normal systolic function.The right ventricular size is normal.  5. Left atrial size was mildly dilated.  6. Right atrial size was normal.  7. Mild mitral annular calcification.  8. The mitral valve is normal in structure. Mild to moderate mitral valve regurgitation. No evidence of mitral stenosis.  9. The tricuspid valve is normal in structure. Tricuspid valve regurgitation is mild. 10. The aortic valve is normal in structure. Aortic valve regurgitation is not visualized. No evidence of aortic valve sclerosis or stenosis. 11. The pulmonic valve was not well visualized. Pulmonic valve regurgitation is trivial. 12. The inferior vena cava is normal in size with greater than 50% respiratory variability, suggesting right atrial pressure of 3 mmHg. 13. Akinesis of the mid/distal anteroseptal wall and apex with overall severe LV dysfunction; grade 1 diastolic dysfunction; chordal SAM with severely elevated LVOT gradient (6 m/s); mild to moderate MR; mild LAE; mild TR with mild pulmonary  hypertension.  Subjective: Confused without complaints. No shortness of breath or pain. No bleeding reported.  Discharge Exam: Vitals:   10/25/19 0700 10/25/19 0800  BP: (!) 151/106   Pulse: 70   Resp: 20   Temp: 97.8 F (36.6 C) 97.8 F (36.6 C)  SpO2: 96%    General: Pt is alert, awake, not in acute distress Cardiovascular: RRR, S1/S2 +, no rubs, no gallops Respiratory: CTA bilaterally, no wheezing, no rhonchi Abdominal: Soft, NT, ND, bowel sounds + Extremities: No edema, no  cyanosis  Labs: BNP (last 3 results) Recent Labs    10/11/19 0021  BNP 0,960.4*   Basic Metabolic Panel: Recent Labs  Lab 10/21/19 0444 10/22/19 0435 10/23/19 0637 10/24/19 1530 10/25/19 0257  NA 147* 144 146* 142 142  K 3.6 3.4* 3.3* 4.6 3.8  CL 111 109 113* 113* 110  CO2 21* 21* 19* 20* 21*  GLUCOSE 108* 92 84 104* 89  BUN 26* 27* 28* 29* 23  CREATININE 0.64 0.63 0.60 0.71 0.53  CALCIUM 8.5* 8.6* 8.7* 8.4* 8.7*   Liver Function Tests: Recent Labs  Lab 10/21/19 0444 10/22/19 0435 10/23/19 0637 10/24/19 1530 10/25/19 0257  AST 125* 95* 69* 69* 45*  ALT 150* 130* 105* 82* 78*  ALKPHOS 53 55 57 53 59  BILITOT 0.7 0.7 0.8 1.3* 0.7  PROT 5.9* 6.0* 5.9* 5.8* 6.2*  ALBUMIN 3.1* 3.1* 3.1* 3.2* 3.4*   No results for input(s): LIPASE, AMYLASE in the last 168 hours. No results for input(s): AMMONIA in the last 168 hours. CBC: Recent Labs  Lab 10/24/19 1530 10/25/19 0257  WBC 8.8 8.8  NEUTROABS 6.8 7.1  HGB 12.5 12.7  HCT 38.7 39.1  MCV 97.7 96.1  PLT 211 272   Cardiac Enzymes: No results for input(s): CKTOTAL, CKMB, CKMBINDEX, TROPONINI in the last 168 hours. BNP: Invalid input(s): POCBNP CBG: Recent Labs  Lab 10/24/19 1617 10/24/19 2054 10/25/19 0004 10/25/19 0420 10/25/19 0733  GLUCAP 135* 132* 126* 73 86   D-Dimer Recent Labs    10/24/19 1530 10/25/19 0257  DDIMER 3.50* 3.66*   Hgb A1c No results for input(s): HGBA1C in the last 72 hours. Lipid Profile No results for input(s): CHOL, HDL, LDLCALC, TRIG, CHOLHDL, LDLDIRECT in the last 72 hours. Thyroid function studies No results for input(s): TSH, T4TOTAL, T3FREE, THYROIDAB in the last 72 hours.  Invalid input(s): FREET3 Anemia work up Recent Labs    10/24/19 1530  FERRITIN 104   Urinalysis    Component Value Date/Time   COLORURINE YELLOW 01/08/2019 1400   APPEARANCEUR CLOUDY (A) 01/08/2019 1400   LABSPEC 1.024 01/08/2019 1400   PHURINE 7.0 01/08/2019 1400   GLUCOSEU 50 (A)  01/08/2019 1400   HGBUR NEGATIVE 01/08/2019 1400   BILIRUBINUR NEGATIVE 01/08/2019 1400   BILIRUBINUR neg 08/31/2012 1142   KETONESUR 20 (A) 01/08/2019 1400   PROTEINUR 100 (A) 01/08/2019 1400   UROBILINOGEN 0.2 10/02/2015 2102   NITRITE NEGATIVE 01/08/2019 1400   LEUKOCYTESUR NEGATIVE 01/08/2019 1400    Microbiology Recent Results (from the past 240 hour(s))  SARS CORONAVIRUS 2 (TAT 6-24 HRS) Nasopharyngeal Nasopharyngeal Swab     Status: Abnormal   Collection Time: 10/19/19  5:27 PM   Specimen: Nasopharyngeal Swab  Result Value Ref Range Status   SARS Coronavirus 2 POSITIVE (A) NEGATIVE Final    Comment: RESULT CALLED TO, READ BACK BY AND VERIFIED WITH: H.BUYKONG,RN 0103 10/20/19 G.MCADOO (NOTE) SARS-CoV-2 target nucleic acids are DETECTED. The SARS-CoV-2 RNA is generally detectable in upper and lower respiratory specimens during the acute phase of infection. Positive results are indicative of the presence of SARS-CoV-2 RNA. Clinical correlation with patient history and other diagnostic information is  necessary to determine patient infection status. Positive results do not rule out bacterial infection or co-infection with other viruses.  The expected result is Negative. Fact Sheet for Patients: SugarRoll[employer]be Fact Sheet for Healthcare Providers: https://www.woods-mathews.com/ This test is not yet approved or cleared by the [place] FDA and  has been authorized for detection and/or diagnosis  of SARS-CoV-2 by FDA under an Emergency Use Authorization (EUA). This EUA will remain  in effect (meaning this test can be used) for the  duration of the COVID-19 declaration under Section 564(b)(1) of the Act, 21 U.S.C. section 360bbb-3(b)(1), unless the authorization is terminated or revoked sooner. Performed at [facility], [address] [place], [place] [address]     Time coordinating discharge: Approximately 40  minutes  Patrecia Pour, MD  [facility] 10/25/2019, 10:15 AM

## 2019-10-26 ENCOUNTER — Emergency Department (HOSPITAL_COMMUNITY)
Admission: EM | Admit: 2019-10-26 | Discharge: 2019-11-25 | Disposition: E | Payer: Medicare Other | Attending: Emergency Medicine | Admitting: Emergency Medicine

## 2019-10-26 DIAGNOSIS — I469 Cardiac arrest, cause unspecified: Secondary | ICD-10-CM | POA: Insufficient documentation

## 2019-11-25 NOTE — ED Notes (Addendum)
Hannah Galvan  Patients son notified at [phone] of mothers passing in route to ED from [facility]

## 2019-11-25 DEATH — deceased

## 2020-11-14 IMAGING — CR DG CHEST 2V
2 series · 2 of 2 positions shown · non-contrast
Comparison: 01/09/2019

CLINICAL DATA: Recent increasing coughing, shortness of breath and
confusion.

EXAM:
CHEST - 2 VIEW

[w chest lat]
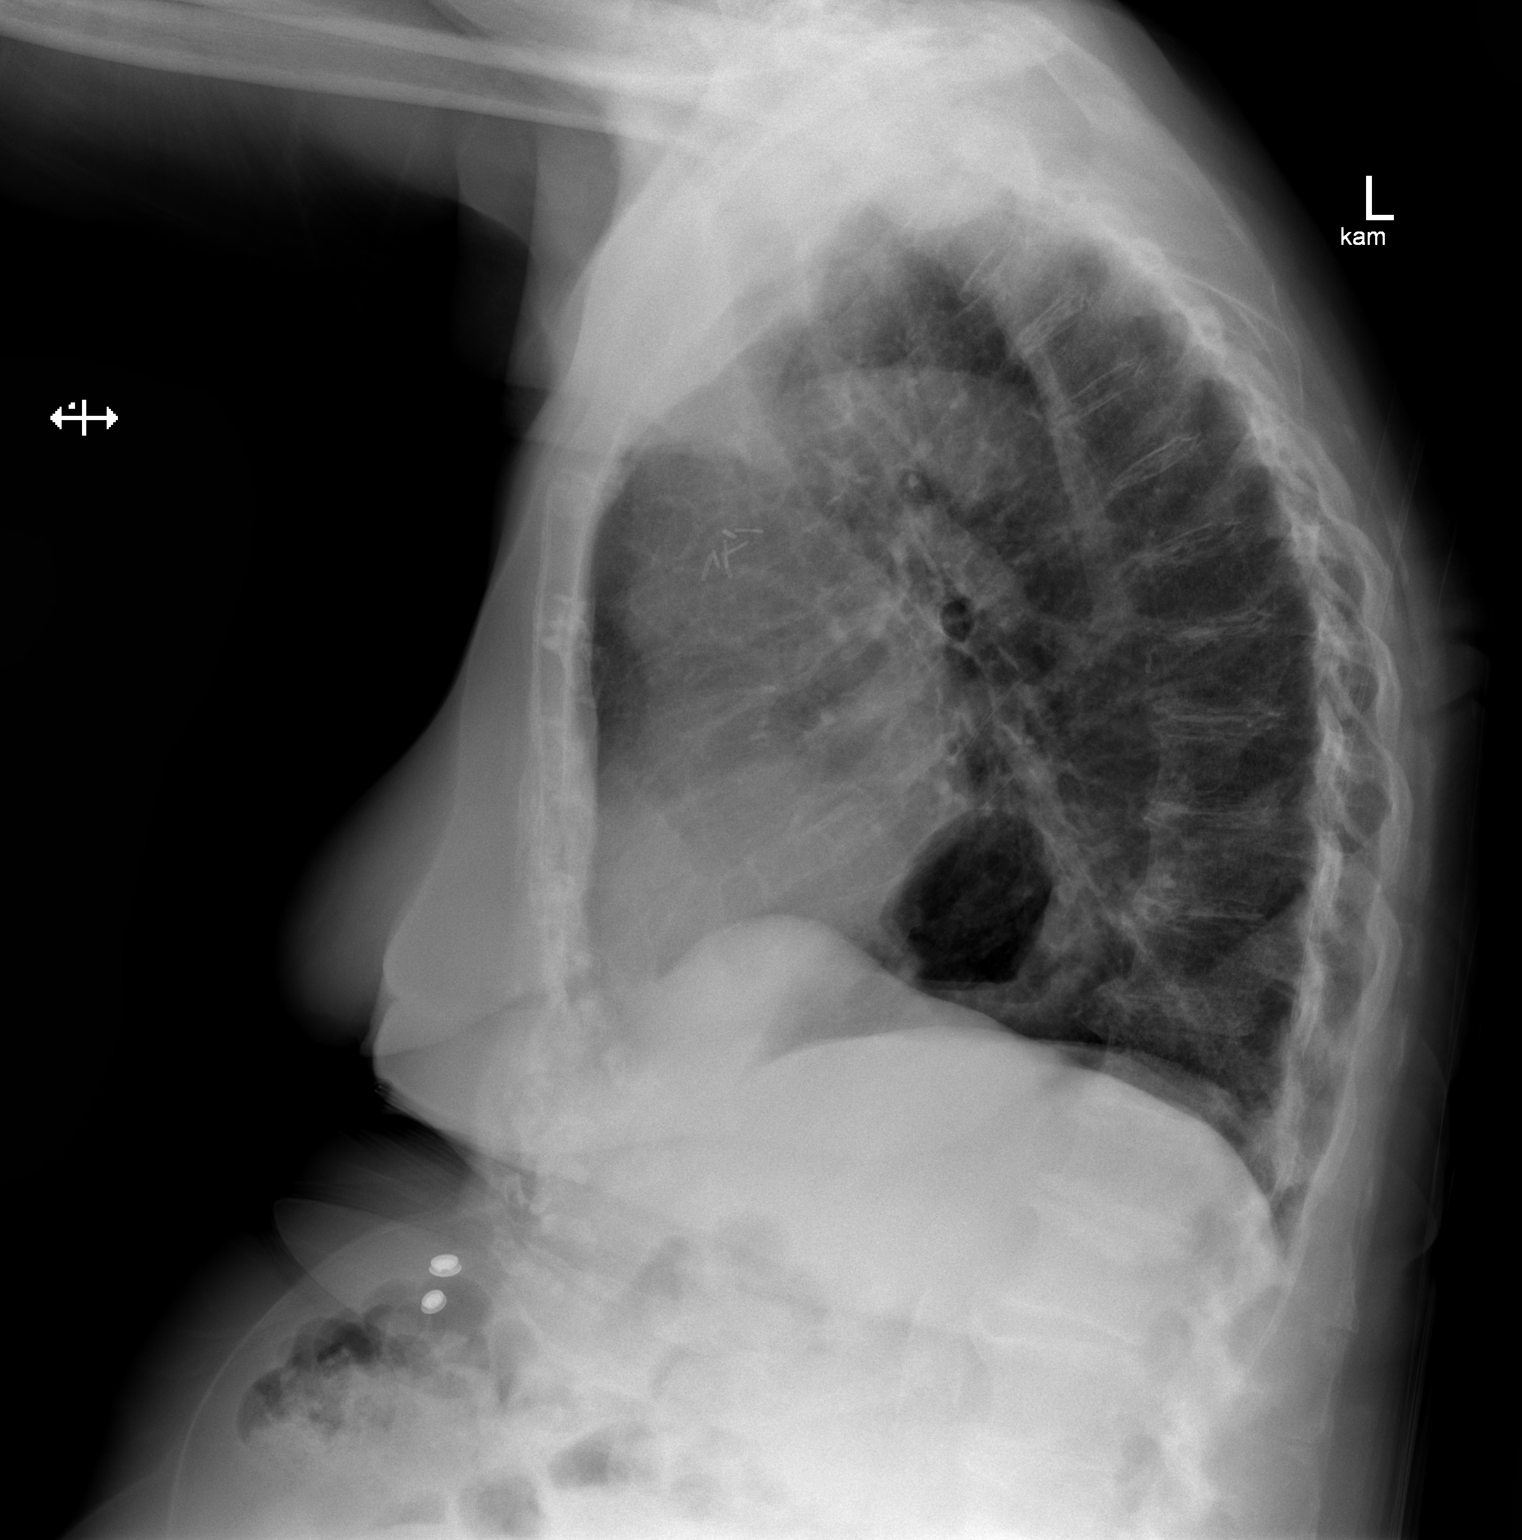

[w chest pa]
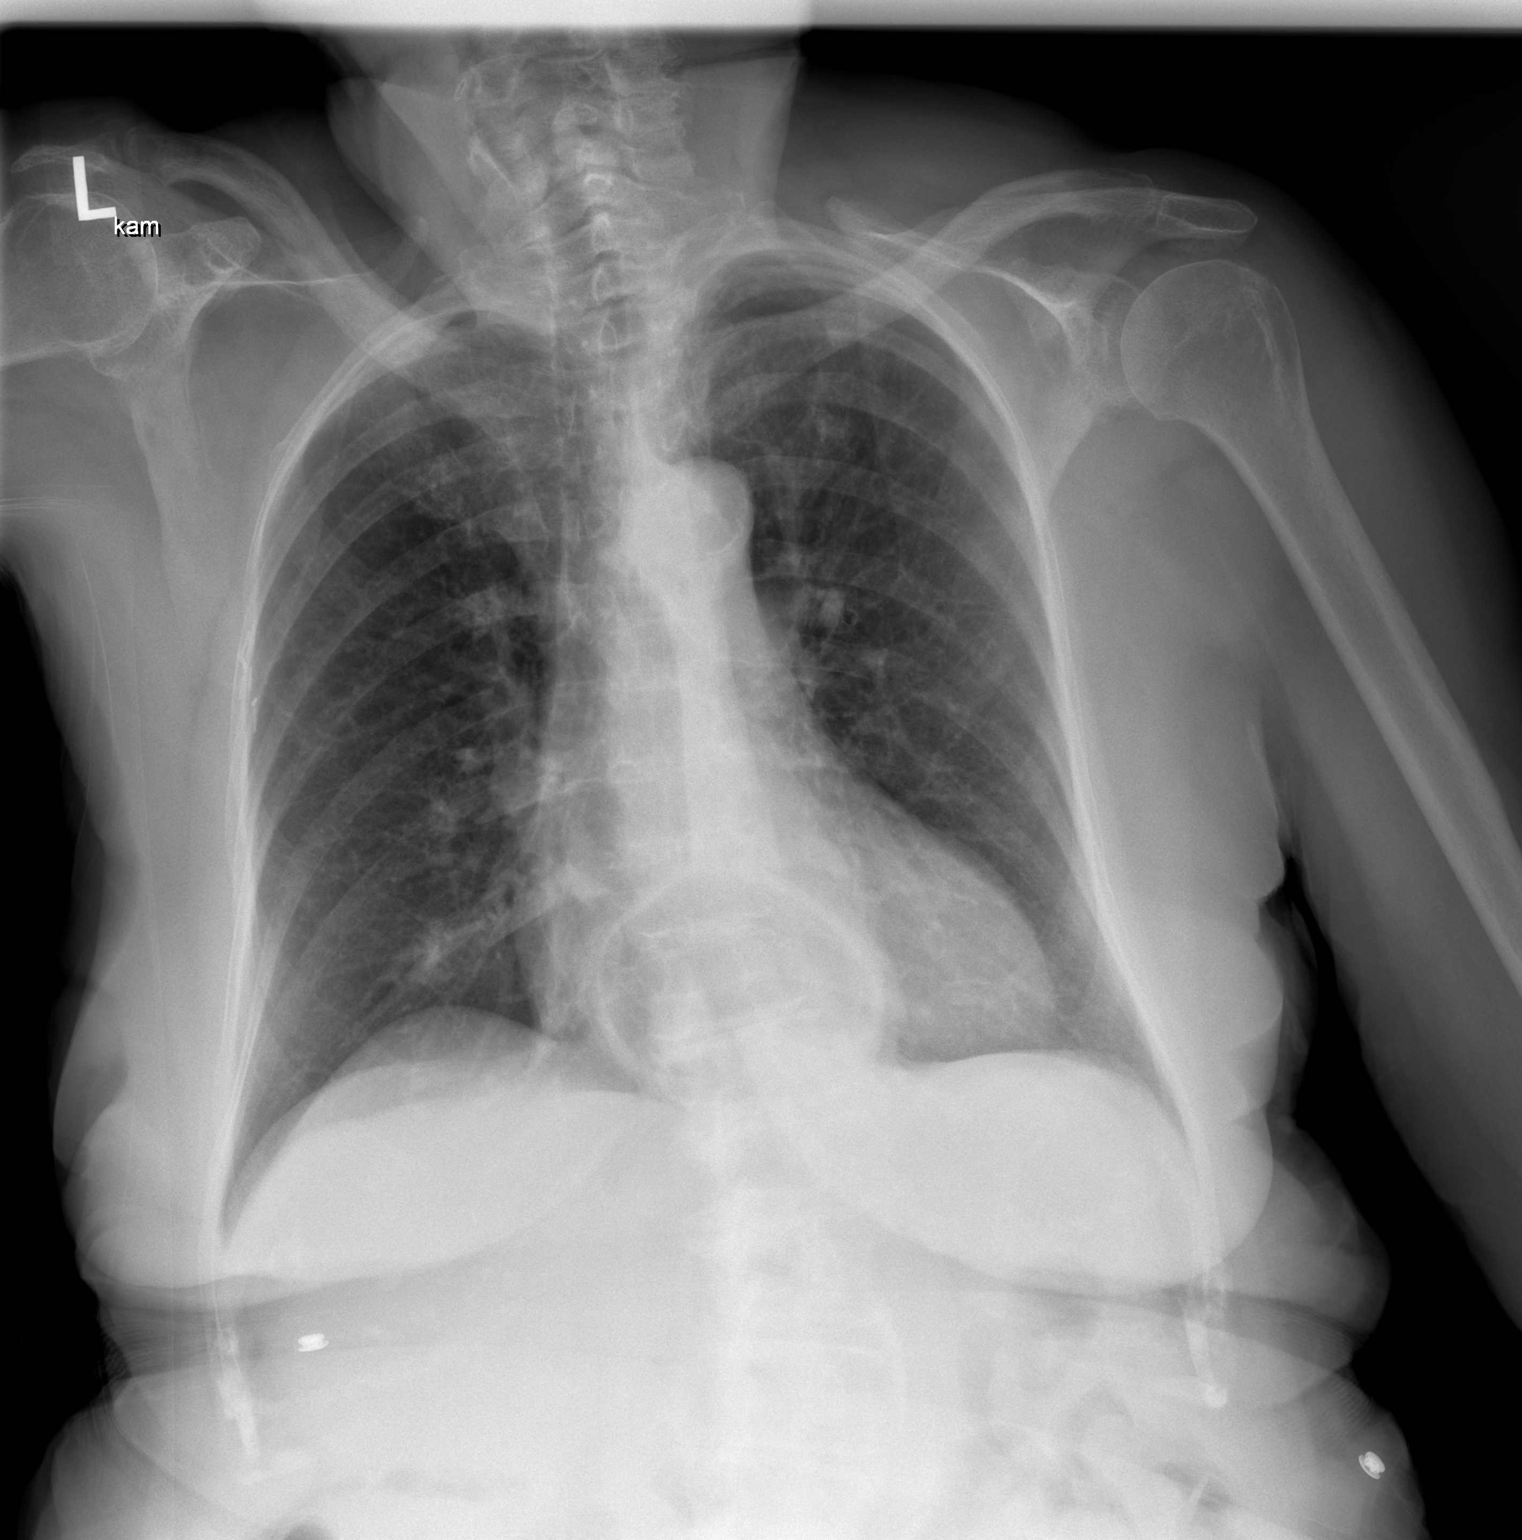

[2 of 2 positions shown; findings below may reference images not displayed]

FINDINGS: Cardiomediastinal silhouette is normal. Mediastinal contours appear
intact. Calcific atherosclerotic disease and tortuosity of the
aorta. Moderate in size hiatal hernia.

There is no evidence of focal airspace consolidation, pleural
effusion or pneumothorax.

Osseous structures are without acute abnormality. Right chest wall
postsurgical changes.
IMPRESSION: No airspace consolidation or pulmonary edema.

Calcific atherosclerotic disease and tortuosity of the aorta.

Moderate in size hiatal hernia.
